# Patient Record
Sex: Female | Born: 1970 | Race: White | Hispanic: No | Smoking: Current every day smoker
Health system: Southern US, Community
[De-identification: ages and names within clinical notes are randomized; demographics above are authoritative.]

## PROBLEM LIST (undated history)

## (undated) DIAGNOSIS — C801 Malignant (primary) neoplasm, unspecified: Secondary | ICD-10-CM

## (undated) DIAGNOSIS — F32A Depression, unspecified: Secondary | ICD-10-CM

## (undated) DIAGNOSIS — Z8049 Family history of malignant neoplasm of other genital organs: Secondary | ICD-10-CM

## (undated) DIAGNOSIS — K5790 Diverticulosis of intestine, part unspecified, without perforation or abscess without bleeding: Secondary | ICD-10-CM

## (undated) DIAGNOSIS — K219 Gastro-esophageal reflux disease without esophagitis: Secondary | ICD-10-CM

## (undated) DIAGNOSIS — R17 Unspecified jaundice: Secondary | ICD-10-CM

## (undated) DIAGNOSIS — K589 Irritable bowel syndrome without diarrhea: Secondary | ICD-10-CM

## (undated) DIAGNOSIS — A4902 Methicillin resistant Staphylococcus aureus infection, unspecified site: Secondary | ICD-10-CM

## (undated) DIAGNOSIS — F419 Anxiety disorder, unspecified: Secondary | ICD-10-CM

## (undated) DIAGNOSIS — Z8042 Family history of malignant neoplasm of prostate: Secondary | ICD-10-CM

## (undated) DIAGNOSIS — R519 Headache, unspecified: Secondary | ICD-10-CM

## (undated) DIAGNOSIS — H269 Unspecified cataract: Secondary | ICD-10-CM

## (undated) DIAGNOSIS — F329 Major depressive disorder, single episode, unspecified: Secondary | ICD-10-CM

## (undated) DIAGNOSIS — R51 Headache: Secondary | ICD-10-CM

## (undated) DIAGNOSIS — H52209 Unspecified astigmatism, unspecified eye: Secondary | ICD-10-CM

## (undated) DIAGNOSIS — K2210 Ulcer of esophagus without bleeding: Secondary | ICD-10-CM

## (undated) DIAGNOSIS — Z8041 Family history of malignant neoplasm of ovary: Secondary | ICD-10-CM

## (undated) DIAGNOSIS — S92309K Fracture of unspecified metatarsal bone(s), unspecified foot, subsequent encounter for fracture with nonunion: Secondary | ICD-10-CM

## (undated) DIAGNOSIS — B958 Unspecified staphylococcus as the cause of diseases classified elsewhere: Secondary | ICD-10-CM

## (undated) DIAGNOSIS — Z87440 Personal history of urinary (tract) infections: Secondary | ICD-10-CM

## (undated) DIAGNOSIS — Z8719 Personal history of other diseases of the digestive system: Secondary | ICD-10-CM

## (undated) DIAGNOSIS — M1990 Unspecified osteoarthritis, unspecified site: Secondary | ICD-10-CM

## (undated) HISTORY — PX: DILATION AND CURETTAGE OF UTERUS: SHX78

## (undated) HISTORY — DX: Unspecified cataract: H26.9

## (undated) HISTORY — DX: Unspecified astigmatism, unspecified eye: H52.209

## (undated) HISTORY — DX: Malignant (primary) neoplasm, unspecified: C80.1

## (undated) HISTORY — DX: Personal history of urinary (tract) infections: Z87.440

## (undated) HISTORY — PX: WISDOM TOOTH EXTRACTION: SHX21

## (undated) HISTORY — DX: Unspecified staphylococcus as the cause of diseases classified elsewhere: B95.8

## (undated) HISTORY — DX: Unspecified osteoarthritis, unspecified site: M19.90

## (undated) HISTORY — DX: Unspecified jaundice: R17

## (undated) HISTORY — DX: Family history of malignant neoplasm of prostate: Z80.42

## (undated) HISTORY — DX: Family history of malignant neoplasm of ovary: Z80.41

## (undated) HISTORY — DX: Family history of malignant neoplasm of other genital organs: Z80.49

---

## 2001-04-16 ENCOUNTER — Emergency Department (HOSPITAL_COMMUNITY): Admission: EM | Admit: 2001-04-16 | Discharge: 2001-04-16 | Payer: Self-pay | Admitting: Emergency Medicine

## 2003-04-10 ENCOUNTER — Encounter: Payer: Self-pay | Admitting: Emergency Medicine

## 2003-04-10 ENCOUNTER — Emergency Department (HOSPITAL_COMMUNITY): Admission: EM | Admit: 2003-04-10 | Discharge: 2003-04-10 | Payer: Self-pay | Admitting: Emergency Medicine

## 2003-12-09 ENCOUNTER — Emergency Department (HOSPITAL_COMMUNITY): Admission: EM | Admit: 2003-12-09 | Discharge: 2003-12-09 | Payer: Self-pay | Admitting: Emergency Medicine

## 2004-01-29 ENCOUNTER — Emergency Department (HOSPITAL_COMMUNITY): Admission: EM | Admit: 2004-01-29 | Discharge: 2004-01-29 | Payer: Self-pay | Admitting: Family Medicine

## 2004-03-08 ENCOUNTER — Emergency Department (HOSPITAL_COMMUNITY): Admission: EM | Admit: 2004-03-08 | Discharge: 2004-03-08 | Payer: Self-pay | Admitting: Family Medicine

## 2004-05-07 ENCOUNTER — Emergency Department (HOSPITAL_COMMUNITY): Admission: EM | Admit: 2004-05-07 | Discharge: 2004-05-07 | Payer: Self-pay | Admitting: Family Medicine

## 2004-06-24 ENCOUNTER — Ambulatory Visit (HOSPITAL_COMMUNITY): Admission: RE | Admit: 2004-06-24 | Discharge: 2004-06-24 | Payer: Self-pay | Admitting: *Deleted

## 2004-06-24 ENCOUNTER — Encounter (INDEPENDENT_AMBULATORY_CARE_PROVIDER_SITE_OTHER): Payer: Self-pay | Admitting: *Deleted

## 2004-08-31 ENCOUNTER — Ambulatory Visit: Payer: Self-pay | Admitting: Psychiatry

## 2004-08-31 ENCOUNTER — Other Ambulatory Visit (HOSPITAL_COMMUNITY): Admission: RE | Admit: 2004-08-31 | Discharge: 2004-09-15 | Payer: Self-pay | Admitting: Psychiatry

## 2004-09-23 ENCOUNTER — Emergency Department (HOSPITAL_COMMUNITY): Admission: EM | Admit: 2004-09-23 | Discharge: 2004-09-23 | Payer: Self-pay | Admitting: *Deleted

## 2004-09-24 ENCOUNTER — Ambulatory Visit (HOSPITAL_COMMUNITY): Admission: RE | Admit: 2004-09-24 | Discharge: 2004-09-24 | Payer: Self-pay | Admitting: Family Medicine

## 2004-11-08 ENCOUNTER — Ambulatory Visit: Payer: Self-pay | Admitting: Family Medicine

## 2004-11-11 ENCOUNTER — Ambulatory Visit: Payer: Self-pay | Admitting: Family Medicine

## 2004-11-12 ENCOUNTER — Ambulatory Visit: Payer: Self-pay | Admitting: *Deleted

## 2004-11-29 ENCOUNTER — Ambulatory Visit: Payer: Self-pay | Admitting: Family Medicine

## 2004-12-07 ENCOUNTER — Ambulatory Visit: Payer: Self-pay | Admitting: Family Medicine

## 2004-12-17 ENCOUNTER — Ambulatory Visit: Payer: Self-pay | Admitting: Internal Medicine

## 2004-12-20 ENCOUNTER — Ambulatory Visit: Payer: Self-pay | Admitting: Family Medicine

## 2005-01-12 ENCOUNTER — Ambulatory Visit: Payer: Self-pay | Admitting: Internal Medicine

## 2005-03-03 ENCOUNTER — Emergency Department (HOSPITAL_COMMUNITY): Admission: EM | Admit: 2005-03-03 | Discharge: 2005-03-03 | Payer: Self-pay | Admitting: Family Medicine

## 2005-03-14 ENCOUNTER — Emergency Department (HOSPITAL_COMMUNITY): Admission: EM | Admit: 2005-03-14 | Discharge: 2005-03-14 | Payer: Self-pay | Admitting: Emergency Medicine

## 2005-03-29 ENCOUNTER — Ambulatory Visit: Payer: Self-pay | Admitting: Psychiatry

## 2005-03-29 ENCOUNTER — Inpatient Hospital Stay (HOSPITAL_COMMUNITY): Admission: RE | Admit: 2005-03-29 | Discharge: 2005-04-03 | Payer: Self-pay | Admitting: Psychiatry

## 2005-04-21 ENCOUNTER — Emergency Department (HOSPITAL_COMMUNITY): Admission: EM | Admit: 2005-04-21 | Discharge: 2005-04-21 | Payer: Self-pay | Admitting: Family Medicine

## 2005-04-24 ENCOUNTER — Emergency Department (HOSPITAL_COMMUNITY): Admission: EM | Admit: 2005-04-24 | Discharge: 2005-04-24 | Payer: Self-pay | Admitting: Family Medicine

## 2005-05-01 ENCOUNTER — Emergency Department (HOSPITAL_COMMUNITY): Admission: EM | Admit: 2005-05-01 | Discharge: 2005-05-01 | Payer: Self-pay | Admitting: Emergency Medicine

## 2005-05-22 ENCOUNTER — Inpatient Hospital Stay (HOSPITAL_COMMUNITY): Admission: EM | Admit: 2005-05-22 | Discharge: 2005-05-23 | Payer: Self-pay | Admitting: Emergency Medicine

## 2005-05-23 ENCOUNTER — Emergency Department (HOSPITAL_COMMUNITY): Admission: EM | Admit: 2005-05-23 | Discharge: 2005-05-23 | Payer: Self-pay | Admitting: *Deleted

## 2005-05-24 ENCOUNTER — Ambulatory Visit (HOSPITAL_COMMUNITY): Admission: RE | Admit: 2005-05-24 | Discharge: 2005-05-24 | Payer: Self-pay | Admitting: *Deleted

## 2005-05-26 ENCOUNTER — Emergency Department (HOSPITAL_COMMUNITY): Admission: EM | Admit: 2005-05-26 | Discharge: 2005-05-26 | Payer: Self-pay | Admitting: Emergency Medicine

## 2005-07-17 ENCOUNTER — Emergency Department (HOSPITAL_COMMUNITY): Admission: EM | Admit: 2005-07-17 | Discharge: 2005-07-17 | Payer: Self-pay | Admitting: Family Medicine

## 2005-08-11 ENCOUNTER — Emergency Department (HOSPITAL_COMMUNITY): Admission: EM | Admit: 2005-08-11 | Discharge: 2005-08-11 | Payer: Self-pay | Admitting: Emergency Medicine

## 2005-09-08 ENCOUNTER — Emergency Department (HOSPITAL_COMMUNITY): Admission: EM | Admit: 2005-09-08 | Discharge: 2005-09-08 | Payer: Self-pay | Admitting: Emergency Medicine

## 2005-09-27 ENCOUNTER — Ambulatory Visit: Payer: Self-pay | Admitting: Family Medicine

## 2005-12-01 ENCOUNTER — Ambulatory Visit: Payer: Self-pay | Admitting: Family Medicine

## 2006-01-30 ENCOUNTER — Inpatient Hospital Stay (HOSPITAL_COMMUNITY): Admission: RE | Admit: 2006-01-30 | Discharge: 2006-02-02 | Payer: Self-pay | Admitting: *Deleted

## 2006-01-31 ENCOUNTER — Ambulatory Visit: Payer: Self-pay | Admitting: *Deleted

## 2007-04-23 ENCOUNTER — Inpatient Hospital Stay (HOSPITAL_COMMUNITY): Admission: EM | Admit: 2007-04-23 | Discharge: 2007-04-26 | Payer: Self-pay | Admitting: Emergency Medicine

## 2007-05-14 DIAGNOSIS — K2990 Gastroduodenitis, unspecified, without bleeding: Secondary | ICD-10-CM

## 2007-05-14 DIAGNOSIS — K589 Irritable bowel syndrome without diarrhea: Secondary | ICD-10-CM | POA: Insufficient documentation

## 2007-05-14 DIAGNOSIS — K219 Gastro-esophageal reflux disease without esophagitis: Secondary | ICD-10-CM | POA: Insufficient documentation

## 2007-05-14 DIAGNOSIS — Z8739 Personal history of other diseases of the musculoskeletal system and connective tissue: Secondary | ICD-10-CM

## 2007-05-14 DIAGNOSIS — K649 Unspecified hemorrhoids: Secondary | ICD-10-CM | POA: Insufficient documentation

## 2007-05-14 DIAGNOSIS — K056 Periodontal disease, unspecified: Secondary | ICD-10-CM | POA: Insufficient documentation

## 2007-05-14 DIAGNOSIS — K2970 Gastritis, unspecified, without bleeding: Secondary | ICD-10-CM

## 2007-05-14 DIAGNOSIS — K069 Disorder of gingiva and edentulous alveolar ridge, unspecified: Secondary | ICD-10-CM

## 2007-05-14 DIAGNOSIS — F1410 Cocaine abuse, uncomplicated: Secondary | ICD-10-CM | POA: Insufficient documentation

## 2007-05-14 DIAGNOSIS — Z8719 Personal history of other diseases of the digestive system: Secondary | ICD-10-CM

## 2007-05-16 ENCOUNTER — Encounter (INDEPENDENT_AMBULATORY_CARE_PROVIDER_SITE_OTHER): Payer: Self-pay | Admitting: *Deleted

## 2007-05-31 ENCOUNTER — Ambulatory Visit: Payer: Self-pay | Admitting: Internal Medicine

## 2007-05-31 DIAGNOSIS — E7800 Pure hypercholesterolemia, unspecified: Secondary | ICD-10-CM | POA: Insufficient documentation

## 2007-05-31 DIAGNOSIS — G4700 Insomnia, unspecified: Secondary | ICD-10-CM | POA: Insufficient documentation

## 2007-05-31 DIAGNOSIS — L408 Other psoriasis: Secondary | ICD-10-CM

## 2007-05-31 DIAGNOSIS — F3189 Other bipolar disorder: Secondary | ICD-10-CM | POA: Insufficient documentation

## 2007-06-19 ENCOUNTER — Ambulatory Visit: Payer: Self-pay | Admitting: Internal Medicine

## 2007-07-03 ENCOUNTER — Ambulatory Visit: Payer: Self-pay | Admitting: Internal Medicine

## 2007-07-03 DIAGNOSIS — R197 Diarrhea, unspecified: Secondary | ICD-10-CM | POA: Insufficient documentation

## 2007-07-03 DIAGNOSIS — D759 Disease of blood and blood-forming organs, unspecified: Secondary | ICD-10-CM | POA: Insufficient documentation

## 2007-07-03 DIAGNOSIS — Z85820 Personal history of malignant melanoma of skin: Secondary | ICD-10-CM

## 2007-07-09 ENCOUNTER — Encounter (INDEPENDENT_AMBULATORY_CARE_PROVIDER_SITE_OTHER): Payer: Self-pay | Admitting: Internal Medicine

## 2007-07-10 LAB — CONVERTED CEMR LAB
Basophils Absolute: 0 10*3/uL (ref 0.0–0.1)
Basophils Relative: 1 % (ref 0–1)
Cholesterol: 187 mg/dL (ref 0–200)
Eosinophils Absolute: 0.3 10*3/uL (ref 0.0–0.7)
Eosinophils Relative: 3 % (ref 0–5)
HDL: 39 mg/dL — ABNORMAL LOW (ref >39)
LDL Cholesterol: 115 mg/dL — ABNORMAL HIGH (ref 0–99)
Lymphs Abs: 0.9 10*3/uL (ref 0.7–3.3)
MCHC: 30.3 g/dL (ref 30.0–36.0)
Monocytes Absolute: 0.4 10*3/uL (ref 0.2–0.7)
Neutro Abs: 6.4 10*3/uL (ref 1.7–7.7)
Neutrophils Relative %: 81 % — ABNORMAL HIGH (ref 43–77)
RBC: 4.68 M/uL (ref 3.87–5.11)
Triglycerides: 165 mg/dL — ABNORMAL HIGH (ref <150)
VLDL: 33 mg/dL (ref 0–40)
WBC: 7.9 10*3/uL (ref 4.0–10.5)

## 2007-07-18 LAB — CONVERTED CEMR LAB
ALT: 22 units/L (ref 0–35)
AST: 18 units/L (ref 0–37)
Alkaline Phosphatase: 69 units/L (ref 39–117)
TSH: 1.762 microintl units/mL (ref 0.350–5.50)

## 2007-08-14 ENCOUNTER — Ambulatory Visit: Payer: Self-pay | Admitting: Internal Medicine

## 2007-08-14 DIAGNOSIS — D239 Other benign neoplasm of skin, unspecified: Secondary | ICD-10-CM | POA: Insufficient documentation

## 2007-08-22 LAB — CONVERTED CEMR LAB: Homocysteine: 47.1 micromoles/L — ABNORMAL HIGH (ref 4.0–15.4)

## 2007-08-28 ENCOUNTER — Ambulatory Visit: Payer: Self-pay | Admitting: Internal Medicine

## 2007-08-29 ENCOUNTER — Encounter (INDEPENDENT_AMBULATORY_CARE_PROVIDER_SITE_OTHER): Payer: Self-pay | Admitting: Internal Medicine

## 2007-09-05 ENCOUNTER — Encounter (INDEPENDENT_AMBULATORY_CARE_PROVIDER_SITE_OTHER): Payer: Self-pay | Admitting: Internal Medicine

## 2007-09-05 DIAGNOSIS — E538 Deficiency of other specified B group vitamins: Secondary | ICD-10-CM

## 2007-09-13 ENCOUNTER — Ambulatory Visit: Payer: Self-pay | Admitting: Internal Medicine

## 2007-09-17 ENCOUNTER — Encounter (INDEPENDENT_AMBULATORY_CARE_PROVIDER_SITE_OTHER): Payer: Self-pay | Admitting: Internal Medicine

## 2007-09-20 ENCOUNTER — Telehealth (INDEPENDENT_AMBULATORY_CARE_PROVIDER_SITE_OTHER): Payer: Self-pay | Admitting: *Deleted

## 2007-10-04 ENCOUNTER — Telehealth (INDEPENDENT_AMBULATORY_CARE_PROVIDER_SITE_OTHER): Payer: Self-pay | Admitting: Internal Medicine

## 2007-10-18 ENCOUNTER — Ambulatory Visit: Payer: Self-pay | Admitting: Internal Medicine

## 2007-11-01 ENCOUNTER — Ambulatory Visit: Payer: Self-pay | Admitting: Internal Medicine

## 2007-11-01 DIAGNOSIS — K648 Other hemorrhoids: Secondary | ICD-10-CM

## 2007-11-02 ENCOUNTER — Encounter (INDEPENDENT_AMBULATORY_CARE_PROVIDER_SITE_OTHER): Payer: Self-pay | Admitting: Internal Medicine

## 2007-11-07 LAB — CONVERTED CEMR LAB
Anti Nuclear Antibody(ANA): NEGATIVE
IgA: 203 mg/dL (ref 68–378)
Sed Rate: 8 mm/hr (ref 0–22)

## 2007-11-15 ENCOUNTER — Ambulatory Visit: Payer: Self-pay | Admitting: Internal Medicine

## 2007-11-29 ENCOUNTER — Telehealth (INDEPENDENT_AMBULATORY_CARE_PROVIDER_SITE_OTHER): Payer: Self-pay | Admitting: Internal Medicine

## 2007-12-10 ENCOUNTER — Telehealth (INDEPENDENT_AMBULATORY_CARE_PROVIDER_SITE_OTHER): Payer: Self-pay | Admitting: Internal Medicine

## 2007-12-17 ENCOUNTER — Ambulatory Visit: Payer: Self-pay | Admitting: Internal Medicine

## 2007-12-19 ENCOUNTER — Telehealth (INDEPENDENT_AMBULATORY_CARE_PROVIDER_SITE_OTHER): Payer: Self-pay | Admitting: *Deleted

## 2008-01-16 ENCOUNTER — Ambulatory Visit: Payer: Self-pay | Admitting: Internal Medicine

## 2008-02-15 ENCOUNTER — Ambulatory Visit: Payer: Self-pay | Admitting: Internal Medicine

## 2008-03-19 ENCOUNTER — Ambulatory Visit: Payer: Self-pay | Admitting: Internal Medicine

## 2008-04-22 ENCOUNTER — Telehealth (INDEPENDENT_AMBULATORY_CARE_PROVIDER_SITE_OTHER): Payer: Self-pay | Admitting: Internal Medicine

## 2008-05-30 ENCOUNTER — Telehealth (INDEPENDENT_AMBULATORY_CARE_PROVIDER_SITE_OTHER): Payer: Self-pay | Admitting: Internal Medicine

## 2008-06-03 ENCOUNTER — Ambulatory Visit: Payer: Self-pay | Admitting: Internal Medicine

## 2008-06-03 DIAGNOSIS — L659 Nonscarring hair loss, unspecified: Secondary | ICD-10-CM | POA: Insufficient documentation

## 2008-06-04 ENCOUNTER — Telehealth (INDEPENDENT_AMBULATORY_CARE_PROVIDER_SITE_OTHER): Payer: Self-pay | Admitting: Internal Medicine

## 2008-06-30 ENCOUNTER — Encounter (INDEPENDENT_AMBULATORY_CARE_PROVIDER_SITE_OTHER): Payer: Self-pay | Admitting: Internal Medicine

## 2008-07-03 ENCOUNTER — Ambulatory Visit: Payer: Self-pay | Admitting: Internal Medicine

## 2008-07-15 ENCOUNTER — Encounter (INDEPENDENT_AMBULATORY_CARE_PROVIDER_SITE_OTHER): Payer: Self-pay | Admitting: *Deleted

## 2008-08-04 ENCOUNTER — Encounter (INDEPENDENT_AMBULATORY_CARE_PROVIDER_SITE_OTHER): Payer: Self-pay | Admitting: *Deleted

## 2008-08-12 ENCOUNTER — Ambulatory Visit: Payer: Self-pay | Admitting: Internal Medicine

## 2008-09-08 ENCOUNTER — Encounter (INDEPENDENT_AMBULATORY_CARE_PROVIDER_SITE_OTHER): Payer: Self-pay | Admitting: Internal Medicine

## 2008-09-16 ENCOUNTER — Ambulatory Visit: Payer: Self-pay | Admitting: Internal Medicine

## 2008-11-14 ENCOUNTER — Ambulatory Visit: Payer: Self-pay | Admitting: Internal Medicine

## 2008-12-21 ENCOUNTER — Encounter (INDEPENDENT_AMBULATORY_CARE_PROVIDER_SITE_OTHER): Payer: Self-pay | Admitting: Family Medicine

## 2008-12-21 ENCOUNTER — Emergency Department (HOSPITAL_COMMUNITY): Admission: EM | Admit: 2008-12-21 | Discharge: 2008-12-21 | Payer: Self-pay | Admitting: Psychiatry

## 2008-12-22 ENCOUNTER — Ambulatory Visit: Payer: Self-pay | Admitting: Family Medicine

## 2008-12-22 DIAGNOSIS — M79609 Pain in unspecified limb: Secondary | ICD-10-CM | POA: Insufficient documentation

## 2008-12-25 ENCOUNTER — Emergency Department (HOSPITAL_COMMUNITY): Admission: EM | Admit: 2008-12-25 | Discharge: 2008-12-25 | Payer: Self-pay | Admitting: Emergency Medicine

## 2008-12-25 ENCOUNTER — Encounter (INDEPENDENT_AMBULATORY_CARE_PROVIDER_SITE_OTHER): Payer: Self-pay | Admitting: Internal Medicine

## 2008-12-26 ENCOUNTER — Telehealth (INDEPENDENT_AMBULATORY_CARE_PROVIDER_SITE_OTHER): Payer: Self-pay | Admitting: Family Medicine

## 2008-12-28 ENCOUNTER — Emergency Department (HOSPITAL_COMMUNITY): Admission: EM | Admit: 2008-12-28 | Discharge: 2008-12-28 | Payer: Self-pay | Admitting: Emergency Medicine

## 2009-01-17 ENCOUNTER — Emergency Department (HOSPITAL_COMMUNITY): Admission: EM | Admit: 2009-01-17 | Discharge: 2009-01-17 | Payer: Self-pay | Admitting: Emergency Medicine

## 2009-02-14 ENCOUNTER — Emergency Department (HOSPITAL_COMMUNITY): Admission: EM | Admit: 2009-02-14 | Discharge: 2009-02-14 | Payer: Self-pay | Admitting: Emergency Medicine

## 2009-03-24 ENCOUNTER — Ambulatory Visit: Payer: Self-pay | Admitting: Gastroenterology

## 2009-03-24 DIAGNOSIS — R109 Unspecified abdominal pain: Secondary | ICD-10-CM | POA: Insufficient documentation

## 2009-03-24 LAB — CONVERTED CEMR LAB
ALT: 26 units/L (ref 0–35)
AST: 21 units/L (ref 0–37)
Albumin: 3.9 g/dL (ref 3.5–5.2)
BUN: 5 mg/dL — ABNORMAL LOW (ref 6–23)
CO2: 30 meq/L (ref 19–32)
Calcium: 9 mg/dL (ref 8.4–10.5)
Eosinophils Absolute: 0.3 10*3/uL (ref 0.0–0.7)
HCT: 40.3 % (ref 36.0–46.0)
Hemoglobin: 14 g/dL (ref 12.0–15.0)
Lymphocytes Relative: 10.1 % — ABNORMAL LOW (ref 12.0–46.0)
Lymphs Abs: 1.1 10*3/uL (ref 0.7–4.0)
Neutro Abs: 8.9 10*3/uL — ABNORMAL HIGH (ref 1.4–7.7)
Platelets: 340 10*3/uL (ref 150.0–400.0)
RBC: 4.13 M/uL (ref 3.87–5.11)
RDW: 13.7 % (ref 11.5–14.6)
Sed Rate: 7 mm/hr (ref 0–22)
Sodium: 137 meq/L (ref 135–145)

## 2009-03-26 ENCOUNTER — Telehealth (INDEPENDENT_AMBULATORY_CARE_PROVIDER_SITE_OTHER): Payer: Self-pay | Admitting: *Deleted

## 2009-03-26 ENCOUNTER — Ambulatory Visit: Payer: Self-pay | Admitting: Internal Medicine

## 2009-03-30 ENCOUNTER — Encounter: Payer: Self-pay | Admitting: Gastroenterology

## 2009-03-30 ENCOUNTER — Ambulatory Visit: Payer: Self-pay | Admitting: Gastroenterology

## 2009-03-31 ENCOUNTER — Encounter (INDEPENDENT_AMBULATORY_CARE_PROVIDER_SITE_OTHER): Payer: Self-pay | Admitting: *Deleted

## 2009-04-02 ENCOUNTER — Telehealth (INDEPENDENT_AMBULATORY_CARE_PROVIDER_SITE_OTHER): Payer: Self-pay | Admitting: *Deleted

## 2009-04-15 ENCOUNTER — Ambulatory Visit (HOSPITAL_COMMUNITY): Admission: RE | Admit: 2009-04-15 | Discharge: 2009-04-15 | Payer: Self-pay | Admitting: Emergency Medicine

## 2009-04-15 ENCOUNTER — Encounter (INDEPENDENT_AMBULATORY_CARE_PROVIDER_SITE_OTHER): Payer: Self-pay | Admitting: Emergency Medicine

## 2009-04-15 ENCOUNTER — Ambulatory Visit: Payer: Self-pay | Admitting: Vascular Surgery

## 2009-04-20 ENCOUNTER — Encounter: Payer: Self-pay | Admitting: Gastroenterology

## 2009-05-15 ENCOUNTER — Ambulatory Visit: Payer: Self-pay | Admitting: Gastroenterology

## 2009-05-19 ENCOUNTER — Encounter (INDEPENDENT_AMBULATORY_CARE_PROVIDER_SITE_OTHER): Payer: Self-pay | Admitting: Internal Medicine

## 2009-05-22 ENCOUNTER — Encounter: Admission: RE | Admit: 2009-05-22 | Discharge: 2009-05-22 | Payer: Self-pay | Admitting: Orthopedic Surgery

## 2009-05-28 ENCOUNTER — Encounter: Payer: Self-pay | Admitting: Gastroenterology

## 2009-06-15 ENCOUNTER — Emergency Department (HOSPITAL_COMMUNITY): Admission: EM | Admit: 2009-06-15 | Discharge: 2009-06-15 | Payer: Self-pay | Admitting: Emergency Medicine

## 2009-06-15 ENCOUNTER — Telehealth: Payer: Self-pay | Admitting: Gastroenterology

## 2009-06-16 ENCOUNTER — Telehealth: Payer: Self-pay | Admitting: Gastroenterology

## 2009-06-16 ENCOUNTER — Ambulatory Visit: Payer: Self-pay | Admitting: Gastroenterology

## 2009-06-17 ENCOUNTER — Encounter: Payer: Self-pay | Admitting: Gastroenterology

## 2009-06-17 ENCOUNTER — Ambulatory Visit: Payer: Self-pay | Admitting: Gastroenterology

## 2009-06-17 DIAGNOSIS — B3781 Candidal esophagitis: Secondary | ICD-10-CM | POA: Insufficient documentation

## 2009-08-26 ENCOUNTER — Telehealth: Payer: Self-pay | Admitting: Gastroenterology

## 2009-08-29 HISTORY — PX: KNEE ARTHROSCOPY: SUR90

## 2009-10-16 ENCOUNTER — Encounter: Payer: Self-pay | Admitting: Cardiology

## 2009-10-16 ENCOUNTER — Encounter: Admission: RE | Admit: 2009-10-16 | Discharge: 2009-10-16 | Payer: Self-pay | Admitting: Internal Medicine

## 2009-10-19 ENCOUNTER — Ambulatory Visit: Payer: Self-pay | Admitting: Cardiology

## 2009-10-19 DIAGNOSIS — R079 Chest pain, unspecified: Secondary | ICD-10-CM | POA: Insufficient documentation

## 2009-10-19 DIAGNOSIS — Z8669 Personal history of other diseases of the nervous system and sense organs: Secondary | ICD-10-CM

## 2009-10-19 DIAGNOSIS — F17200 Nicotine dependence, unspecified, uncomplicated: Secondary | ICD-10-CM | POA: Insufficient documentation

## 2009-10-19 DIAGNOSIS — R55 Syncope and collapse: Secondary | ICD-10-CM | POA: Insufficient documentation

## 2009-10-20 ENCOUNTER — Ambulatory Visit: Payer: Self-pay

## 2009-10-22 ENCOUNTER — Telehealth: Payer: Self-pay | Admitting: Cardiology

## 2009-10-27 ENCOUNTER — Telehealth (INDEPENDENT_AMBULATORY_CARE_PROVIDER_SITE_OTHER): Payer: Self-pay | Admitting: *Deleted

## 2009-10-28 ENCOUNTER — Ambulatory Visit: Payer: Self-pay | Admitting: Cardiovascular Disease

## 2009-10-28 ENCOUNTER — Ambulatory Visit: Payer: Self-pay

## 2009-10-28 ENCOUNTER — Encounter: Payer: Self-pay | Admitting: Cardiology

## 2009-10-28 ENCOUNTER — Encounter (HOSPITAL_COMMUNITY): Admission: RE | Admit: 2009-10-28 | Discharge: 2009-12-29 | Payer: Self-pay | Admitting: Cardiology

## 2009-10-28 ENCOUNTER — Ambulatory Visit (HOSPITAL_COMMUNITY): Admission: RE | Admit: 2009-10-28 | Discharge: 2009-10-28 | Payer: Self-pay | Admitting: Cardiology

## 2009-11-02 ENCOUNTER — Telehealth: Payer: Self-pay | Admitting: Cardiology

## 2009-11-16 ENCOUNTER — Emergency Department (HOSPITAL_COMMUNITY): Admission: EM | Admit: 2009-11-16 | Discharge: 2009-11-16 | Payer: Self-pay | Admitting: Emergency Medicine

## 2009-11-19 ENCOUNTER — Other Ambulatory Visit: Payer: Self-pay

## 2009-11-19 ENCOUNTER — Other Ambulatory Visit: Payer: Self-pay | Admitting: Emergency Medicine

## 2009-11-19 ENCOUNTER — Inpatient Hospital Stay (HOSPITAL_COMMUNITY): Admission: AD | Admit: 2009-11-19 | Discharge: 2009-11-26 | Payer: Self-pay | Admitting: Psychiatry

## 2009-11-19 ENCOUNTER — Ambulatory Visit: Payer: Self-pay | Admitting: Psychiatry

## 2009-12-16 ENCOUNTER — Encounter: Payer: Self-pay | Admitting: Cardiology

## 2010-01-01 ENCOUNTER — Emergency Department (HOSPITAL_COMMUNITY): Admission: EM | Admit: 2010-01-01 | Discharge: 2010-01-01 | Payer: Self-pay | Admitting: Emergency Medicine

## 2010-01-17 ENCOUNTER — Emergency Department (HOSPITAL_COMMUNITY): Admission: EM | Admit: 2010-01-17 | Discharge: 2010-01-17 | Payer: Self-pay | Admitting: Family Medicine

## 2010-02-01 ENCOUNTER — Emergency Department (HOSPITAL_COMMUNITY): Admission: EM | Admit: 2010-02-01 | Discharge: 2010-02-01 | Payer: Self-pay | Admitting: Emergency Medicine

## 2010-02-11 ENCOUNTER — Emergency Department (HOSPITAL_COMMUNITY): Admission: EM | Admit: 2010-02-11 | Discharge: 2010-02-11 | Payer: Self-pay | Admitting: Emergency Medicine

## 2010-02-13 ENCOUNTER — Emergency Department (HOSPITAL_COMMUNITY): Admission: EM | Admit: 2010-02-13 | Discharge: 2010-02-13 | Payer: Self-pay | Admitting: Emergency Medicine

## 2010-02-14 ENCOUNTER — Emergency Department (HOSPITAL_COMMUNITY): Admission: EM | Admit: 2010-02-14 | Discharge: 2010-02-14 | Payer: Self-pay | Admitting: Emergency Medicine

## 2010-08-05 ENCOUNTER — Emergency Department (HOSPITAL_COMMUNITY): Admission: EM | Admit: 2010-08-05 | Discharge: 2010-01-14 | Payer: Self-pay | Admitting: Emergency Medicine

## 2010-09-12 ENCOUNTER — Emergency Department (HOSPITAL_COMMUNITY)
Admission: EM | Admit: 2010-09-12 | Discharge: 2010-09-12 | Payer: Self-pay | Source: Home / Self Care | Admitting: Emergency Medicine

## 2010-09-19 ENCOUNTER — Encounter: Payer: Self-pay | Admitting: Family Medicine

## 2010-09-30 NOTE — Progress Notes (Signed)
Summary: TEST RESULTS (STRESS TEST) faxed   Phone Note Call from Patient Call back at Home Phone [phone]   Caller: Patient Reason for Call: Lab or Test Results Summary of Call: STRESS TEST Initial call taken by: Judie Grieve,  November 02, 2009 1:17 PM  Follow-up for Phone Call        Pt had stress nuclear study and echocardiogram for evaluation of syncope.  The stress test demonstrated no ischemia or infarct.  The echo demonstrated a normal EF and no evidence of structural heart disease.  No etiology for the syncope is evident.  Based on these studies and the lack of high risk physical or cardiac history findings, according to ACC/AHA guidelines, the patient would be at acceptable risk for a planned left knee arthroscopy. No further testing is needed prior to this procedure. Follow-up by: Rollene Rotunda, MD, [facility],  November 02, 2009 2:13 PM  Additional Follow-up for Phone Call Additional follow up Details #1::        faxed to Dr Malon Kindle at [facility]. Additional Follow-up by: Charolotte Capuchin, RN,  November 02, 2009 3:27 PM

## 2010-09-30 NOTE — Progress Notes (Signed)
Summary: Nuclear Pre-Procedure  Phone Note Outgoing Call   Call placed by: Milana Na, EMT-P,  October 27, 2009 3:30 PM Summary of Call: Left message with information on Myoview Information Sheet (see scanned document for details).      Nuclear Med Background Indications for Stress Test: Evaluation for Ischemia, Abnormal EKG     Symptoms: Chest Pain, Syncope    Nuclear Pre-Procedure Cardiac Risk Factors: Family History - CAD, Smoker Height (in): 54  Nuclear Med Study Referring MD:  J.Hochrein

## 2010-09-30 NOTE — Progress Notes (Signed)
Summary: nerves   Phone Note Call from Patient Call back at Home Phone [phone]   Caller: Patient Reason for Call: Talk to Nurse Summary of Call: pt needs something to help calm her nerves.... very upset.... does not have pcp Initial call taken by: Migdalia Dk,  October 22, 2009 1:48 PM  Follow-up for Phone Call        very nervous, can't sleep and cant sit still, states she has about had enough, she is too stress and doesn't have anyone to talk to.  She became very upset after I requested she see a primary care or UrgentCare for treatment. She states  that her mother's cardiologist gives her Ativan for her nerves and she doesn't understand why Dr Antoine Poche can't do that for her.  Explained to pt Dr Antoine Poche would prefer pt see a a primary care MD for treatment as this is not his speciality. Pt "thanked me" for my help and hung up. Follow-up by: Charolotte Capuchin, RN,  October 22, 2009 6:17 PM

## 2010-09-30 NOTE — Assessment & Plan Note (Signed)
Summary: Cardiology Nuclear Study  Nuclear Med Background Indications for Stress Test: Evaluation for Ischemia, Surgical Clearance, Abnormal EKG  Indications Comments: Pending (L) knee surgery by Dr. Malon Kindle  History: Echo  History Comments: 10/28/09 Echo:EF=55-60%; h/o cocaine abuse  Symptoms: Chest Pressure, Diaphoresis, Dizziness, DOE, Fatigue, Light-Headedness, Nausea, Palpitations, Rapid HR, Syncope, Vomiting    Nuclear Pre-Procedure Cardiac Risk Factors: Family History - CAD, Smoker Caffeine/Decaff Intake: None NPO After: 8:00 AM Lungs: Clear.  O2 Sat 98% on RA. IV 0.9% NS with Angio Cath: 24g     IV Site: (R) AC IV Started by: Irean Hong RN Chest Size (in) 36     Cup Size D     Height (in): 62 Weight (lb): 159 BMI: 29.19 Tech Comments: Propranolol held x 19 hours.  Nuclear Med Study 1 or 2 day study:  1 day     Stress Test Type:  Eugenie Birks Reading MD:  Charlton Haws, MD     Referring MD:  Rollene Rotunda, MD Resting Radionuclide:  Technetium 75m Tetrofosmin     Resting Radionuclide Dose:  11.0 mCi  Stress Radionuclide:  Technetium 55m Tetrofosmin     Stress Radionuclide Dose:  33.0 mCi   Stress Protocol   Lexiscan: 0.4 mg   Stress Test Technologist:  Rea [employer] CMA-N     Nuclear Technologist:  Burna Mortimer Deal RT-N  Rest Procedure  Myocardial perfusion imaging was performed at rest 45 minutes following the intravenous administration of Myoview Technetium 30m Tetrofosmin.  Stress Procedure  The patient initially walked on the treadmill utilizing the Bruce protocol, but was unable to achieve her target heart rate due to (L) knee pain and only holding her Inderal for 19-hours.  She then received IV Lexiscan 0.4 mg over 15-seconds.  Myoview injected at 30-seconds.  There was a brief change with diffuse T-waves at peak bolus.  Quantitative spect images were obtained after a 45 minute delay.  QPS Raw Data Images:  Normal; no motion artifact; normal heart/lung  ratio. Stress Images:  NI: Uniform and normal uptake of tracer in all myocardial segments. Rest Images:  Normal homogeneous uptake in all areas of the myocardium. Subtraction (SDS):  Normal Transient Ischemic Dilatation:  1.13  (Normal <1.22)  Lung/Heart Ratio:  .34  (Normal <0.45)  Quantitative Gated Spect Images QGS EDV:  65 ml QGS ESV:  19 ml QGS EF:  70 % QGS cine images:  normal  Findings Normal nuclear study      Overall Impression  Exercise Capacity: Lexiscan BP Response: Normal blood pressure response. Clinical Symptoms: Dyspnea and lightheadedness ECG Impression: No significant ST segment change suggestive of ischemia. Overall Impression: Normal stress nuclear study. Overall Impression Comments: normal  Appended Document: Cardiology Nuclear Study NL.  No evidence of ischemia.  Appended Document: Cardiology Nuclear Study Called patient and left message on machine of results and that clearance has been faxed.

## 2010-09-30 NOTE — Assessment & Plan Note (Signed)
Summary: np6/dx:syncope x3/lg  Medications Added CLONAZEPAM ODT 2 MG  TBDP (CLONAZEPAM) 1 tab by mouth at hs DIFLUCAN 100 MG TABS (FLUCONAZOLE) Take 1  tablets p.o. every other day DICYCLOMINE HCL 10 MG CAPS (DICYCLOMINE HCL) 1 po daily AMBIEN 10 MG TABS (ZOLPIDEM TARTRATE) 1 podaily      Allergies Added:   Visit Type:  Initial Consult Referring Provider:  n/a Primary Provider:  Dr. Robert Bellow  CC:  syncope.  History of Present Illness: The patient presents for evaluation of syncope. She says this has been going on for about 1-1/2 months. She says it's happening every other day yet she describes 4 events. She describes most of her episodes happening while seated. For instance she will get home from the grocery store and find that she has apparently passed out in her car being awoken by her boyfriend. She says 3 of the episodes it happened while seated. She said she did fall once with an episode while in her bed apparently sliding to the floor. She says she has lost bowel and bladder on one occasion. She has no prodrome that it's happening. She doesn't feel the palpitations or presyncope described below. She doesn't develop chest discomfort. She is confused when she wakes. There is no seizure activity seen by any outside observers. She does report chest discomfort. However, this happens mostly at night while lying flat. It is a pressure discomfort associated with anxiety that will eventually go away when she tries to light calmly. She does describe shortness of breath when she gets anxious. She says she has a lot of social stress going on. She has never had any prior cardiac workup or diagnosis.  Current Medications (verified): 1)  Citalopram Hydrobromide 40 Mg  Tabs (Citalopram Hydrobromide) .[name].. 1 Tab By Mouth in Am, 1/2 Tab By Mouth At Noon 2)  Clonazepam Odt 2 Mg  Tbdp (Clonazepam) .[name].. 1 Tab By Mouth At [facility] 3)  Lithium Carbonate 300 Mg  Caps (Lithium Carbonate) .[name].. 1 Cap By Mouth in Am  and 2 Caps By Mouth At [facility] On Odd Days, 1 Cap By Mouth At [facility] On Even Days 4)  Levsin/sl 0.125 Mg  Subl (Hyoscyamine Sulfate) .... Take One To Two Tabs As Needed 3 Times A Day For Spasms 5)  Preparation H Hydrocortisone 1 % Crea (Hydrocortisone) .... As Needed 6)  Anamantle Hc 3-2.5 % Kit (Lidocaine-Hydrocortisone Ace) .... Use Per Rectum Once Daily 7)  Ibuprofen 200 Mg Tabs (Ibuprofen) .... As Needed 8)  Propranolol Hcl 20 Mg Tabs (Propranolol Hcl) .... Two Times A Day 9)  Bentyl 20 Mg Tabs (Dicyclomine Hcl) .[name].. 1 By Mouth Four Times A Day 10)  Diflucan 100 Mg Tabs (Fluconazole) .... Take 1  Tablets P.o. Every Other Day 11)  Dicyclomine Hcl 10 Mg Caps (Dicyclomine Hcl) .[name].. 1 Po Daily 12)  Ambien 10 Mg Tabs (Zolpidem Tartrate) .[name].. 1 Podaily  Allergies (verified): 1)  ! Codeine 2)  ! Epinephrine  Past History:  Past Medical History: HEMORRHOIDS (ICD-455.6) DIVERTICULOSIS, COLON, HX OF (ICD-V12.79) GASTRITIS (ICD-535.50) Endoscopy/Colonoscopy Sabino Gasser 10/07 IBS (ICD-564.1) DISORDER, BIPOLAR NEC (ICD-296.89) COCAINE ABUSE (ICD-305.60) (treatment in 1996) GERD (ICD-530.81) DEPRESSION (ICD-311) PERIODONTAL DISEASE (ICD-523.9) MYALGIA, HX OF (ICD-V13.5)     Family History: Mother with CAD at 36, CABG age 61.  Father MI age 44 died of MI age 34. Diabetes in second degree relatives.  Social History: She is single, she is disabled do to bipolar disease, she smoke cigarettes (age 68, 11/2  ppd), she does not drink alcohol, she drinks 3 caffeinated beverages a day, she smokes marijuana.  Review of Systems       Intentional weight loss, fingers go numb, anxiety, insomnia.Otherwise as stated in the history of present illness and negative for all other systems.  Vital Signs:  Patient profile:   40 year old female Height:      62 inches Weight:      163 pounds BMI:     29.92 Pulse rate:   71 / minute Resp:     16 per minute BP sitting:   122 / 80  (right arm)  Vitals Entered  By: Marrion Coy, CNA (October 19, 2009 2:24 PM)  Physical Exam  General:  Well developed, well nourished, in no acute distress. Head:  normocephalic and atraumatic Eyes:  PERRLA/EOM intact; conjunctiva and lids normal. Mouth:  Teeth, gums and palate normal. Oral mucosa normal. Neck:  Neck supple, no JVD. No masses, thyromegaly or abnormal cervical nodes. Chest Wall:  no deformities or breast masses noted Lungs:  Clear bilaterally to auscultation and percussion. Abdomen:  Bowel sounds positive; abdomen soft and non-tender without masses, organomegaly, or hernias noted. No hepatosplenomegaly. Msk:  Back normal, normal gait. Muscle strength and tone normal. Extremities:  No clubbing or cyanosis. Neurologic:  Alert and oriented x 3. Skin:  Intact without lesions or rashes. Cervical Nodes:  no significant adenopathy Axillary Nodes:  no significant adenopathy Inguinal Nodes:  no significant adenopathy Psych:  Normal affect.   Detailed Cardiovascular Exam  Neck    Carotids: Carotids full and equal bilaterally without bruits.      Neck Veins: Normal, no JVD.    Heart    Inspection: no deformities or lifts noted.      Palpation: normal PMI with no thrills palpable.      Auscultation: regular rate and rhythm, S1, S2 without murmurs, rubs, gallops, or clicks.    Vascular    Abdominal Aorta: no palpable masses, pulsations, or audible bruits.      Femoral Pulses: normal femoral pulses bilaterally.      Pedal Pulses: normal pedal pulses bilaterally.      Radial Pulses: normal radial pulses bilaterally.      Peripheral Circulation: no clubbing, cyanosis, or edema noted with normal capillary refill.     EKG  Procedure date:  10/16/2009  Findings:      sinus rhythm, rate 70, axis within normal limits, intervals within normal limits, anterior T-wave inversions consistent with possible ischemia, nonspecific inferior T-wave changes.  Impression & Recommendations:  Problem # 1:   SYNCOPE (ICD-780.2) The patient has these as described. I will start with an event monitor and an echocardiogram. Further evaluation will be based on these results.  Problem # 2:  CHEST PAIN (ICD-786.50) She has chest pain and an abnormal EKG with a significant family history. She will have an exercise perfusion study to rule out ischemia.  Problem # 3:  TOBACCO ABUSE (ICD-305.1) We discussed the need to stop smoking though she does not think she could at this time.  Other Orders: Event (Event) Nuclear Stress Test (Nuc Stress Test) Echocardiogram (Echo)  Patient Instructions: 1)  Your physician recommends that you schedule a follow-up appointment in:  2)  Your physician recommends that you continue on your current medications as directed. Please refer to the Current Medication list given to you today. 3)  Your physician has requested that you have an echocardiogram.  Echocardiography is a painless test that uses  sound waves to create images of your heart. It provides your doctor with information about the size and shape of your heart and how well your heart's chambers and valves are working.  This procedure takes approximately one hour. There are no restrictions for this procedure. 4)  Your physician has recommended that you wear an event monitor.  Event monitors are medical devices that record the heart's electrical activity. Doctors most often use these monitors to diagnose arrhythmias. Arrhythmias are problems with the speed or rhythm of the heartbeat. The monitor is a small, portable device. You can wear one while you do your normal daily activities. This is usually used to diagnose what is causing palpitations/syncope (passing out). 5)  Your physician has requested that you have an exercise stress myoview.  For further information please visit [email or web address]  Please follow instruction sheet, as given.

## 2010-09-30 NOTE — Letter (Signed)
Summary: Generic Letter  Architectural technologist, Main Office  [address]   [place], [place] [address]   Phone: [phone]  Fax: [phone]          December 16, 2009 MRN: [id number]      [facility] [address] [place], [place]  [address]      Dear Ms. Hilyer,   I have attempted several times to contact you about the results of your monitor.  The results are normal sinus rhythm.  If you have questions please call.      Sincerely,      Charolotte Capuchin, RN for Dr. Rollene Rotunda  This letter has been electronically signed by your physician.

## 2010-09-30 NOTE — Letter (Signed)
Summary: [facility]   Imported By: Arta Bruce 03/24/2010 15:16:38  _____________________________________________________________________  External Attachment:    Type:   Image     Comment:   External Document

## 2010-10-27 ENCOUNTER — Emergency Department (HOSPITAL_COMMUNITY)
Admission: EM | Admit: 2010-10-27 | Discharge: 2010-10-27 | Discharge status: Against Medical Advice | Payer: Medicare Other | Attending: Emergency Medicine | Admitting: Emergency Medicine

## 2010-10-27 ENCOUNTER — Emergency Department (HOSPITAL_COMMUNITY): Payer: Medicare Other

## 2010-10-27 DIAGNOSIS — N760 Acute vaginitis: Secondary | ICD-10-CM | POA: Insufficient documentation

## 2010-10-27 DIAGNOSIS — M549 Dorsalgia, unspecified: Secondary | ICD-10-CM | POA: Insufficient documentation

## 2010-10-27 DIAGNOSIS — B9689 Other specified bacterial agents as the cause of diseases classified elsewhere: Secondary | ICD-10-CM | POA: Insufficient documentation

## 2010-10-27 DIAGNOSIS — A499 Bacterial infection, unspecified: Secondary | ICD-10-CM | POA: Insufficient documentation

## 2010-10-27 LAB — URINE MICROSCOPIC-ADD ON

## 2010-10-27 LAB — URINALYSIS, ROUTINE W REFLEX MICROSCOPIC
Ketones, ur: NEGATIVE mg/dL
Specific Gravity, Urine: 1.019 (ref 1.005–1.030)
Urine Glucose, Fasting: NEGATIVE mg/dL
pH: 7 (ref 5.0–8.0)

## 2010-10-27 LAB — WET PREP, GENITAL

## 2010-10-29 LAB — URINE CULTURE
Colony Count: 65000
Culture  Setup Time: 201203010341

## 2010-11-14 LAB — COMPREHENSIVE METABOLIC PANEL
ALT: 18 U/L (ref 0–35)
AST: 14 U/L (ref 0–37)
Albumin: 3.5 g/dL (ref 3.5–5.2)
Calcium: 9 mg/dL (ref 8.4–10.5)
Creatinine, Ser: 0.86 mg/dL (ref 0.4–1.2)
GFR calc Af Amer: >60 mL/min (ref >60)
GFR calc non Af Amer: >60 mL/min (ref >60)
Sodium: 140 mEq/L (ref 135–145)
Total Protein: 6 g/dL (ref 6.0–8.3)

## 2010-11-14 LAB — URINALYSIS, ROUTINE W REFLEX MICROSCOPIC
Bilirubin Urine: NEGATIVE
Glucose, UA: NEGATIVE mg/dL
Glucose, UA: NEGATIVE mg/dL
Ketones, ur: NEGATIVE mg/dL
Nitrite: NEGATIVE
Protein, ur: NEGATIVE mg/dL
Specific Gravity, Urine: 1.004 — ABNORMAL LOW (ref 1.005–1.030)
Urobilinogen, UA: 0.2 mg/dL (ref 0.0–1.0)
pH: 7.5 (ref 5.0–8.0)

## 2010-11-14 LAB — DIFFERENTIAL
Eosinophils Absolute: 0.2 10*3/uL (ref 0.0–0.7)
Eosinophils Absolute: 0.3 10*3/uL (ref 0.0–0.7)
Eosinophils Relative: 2 % (ref 0–5)
Eosinophils Relative: 3 % (ref 0–5)
Lymphocytes Relative: 14 % (ref 12–46)
Lymphocytes Relative: 14 % (ref 12–46)
Lymphs Abs: 1.3 10*3/uL (ref 0.7–4.0)
Lymphs Abs: 1.3 10*3/uL (ref 0.7–4.0)
Monocytes Absolute: 0.4 10*3/uL (ref 0.1–1.0)
Monocytes Relative: 4 % (ref 3–12)
Monocytes Relative: 5 % (ref 3–12)
Neutrophils Relative %: 78 % — ABNORMAL HIGH (ref 43–77)

## 2010-11-14 LAB — CBC
HCT: 38.8 % (ref 36.0–46.0)
MCHC: 34.2 g/dL (ref 30.0–36.0)
MCV: 100.2 fL — ABNORMAL HIGH (ref 78.0–100.0)
MCV: 101.1 fL — ABNORMAL HIGH (ref 78.0–100.0)
Platelets: 312 10*3/uL (ref 150–400)
Platelets: 321 10*3/uL (ref 150–400)
RBC: 3.84 MIL/uL — ABNORMAL LOW (ref 3.87–5.11)
RDW: 13.2 % (ref 11.5–15.5)
WBC: 9.5 10*3/uL (ref 4.0–10.5)

## 2010-11-14 LAB — POCT CARDIAC MARKERS
CKMB, poc: <1 ng/mL — ABNORMAL LOW (ref 1.0–8.0)
Myoglobin, poc: 19.3 ng/mL (ref 12–200)
Troponin i, poc: <0.05 ng/mL (ref 0.00–0.09)

## 2010-11-14 LAB — BASIC METABOLIC PANEL
BUN: 6 mg/dL (ref 6–23)
Chloride: 107 mEq/L (ref 96–112)
GFR calc Af Amer: >60 mL/min (ref >60)
GFR calc non Af Amer: >60 mL/min (ref >60)
Potassium: 3.9 mEq/L (ref 3.5–5.1)

## 2010-11-14 LAB — APTT: aPTT: 29 seconds (ref 24–37)

## 2010-11-14 LAB — D-DIMER, QUANTITATIVE: D-Dimer, Quant: <0.22 ug/mL-FEU (ref 0.00–0.48)

## 2010-11-14 LAB — ABO/RH: ABO/RH(D): O POS

## 2010-11-14 LAB — URINE MICROSCOPIC-ADD ON

## 2010-11-14 LAB — LIPASE, BLOOD: Lipase: 18 U/L (ref 11–59)

## 2010-11-14 LAB — TYPE AND SCREEN
ABO/RH(D): O POS
Antibody Screen: NEGATIVE

## 2010-11-14 LAB — POCT PREGNANCY, URINE: Preg Test, Ur: NEGATIVE

## 2010-11-21 LAB — COMPREHENSIVE METABOLIC PANEL
ALT: 24 U/L (ref 0–35)
AST: 18 U/L (ref 0–37)
Albumin: 3.5 g/dL (ref 3.5–5.2)
CO2: 25 mEq/L (ref 19–32)
Chloride: 110 mEq/L (ref 96–112)
GFR calc Af Amer: >60 mL/min (ref >60)
GFR calc non Af Amer: >60 mL/min (ref >60)
Sodium: 138 mEq/L (ref 135–145)
Total Bilirubin: 0.4 mg/dL (ref 0.3–1.2)

## 2010-11-21 LAB — TRICYCLICS SCREEN, URINE: TCA Scrn: NOT DETECTED

## 2010-11-21 LAB — URINALYSIS, ROUTINE W REFLEX MICROSCOPIC
Glucose, UA: NEGATIVE mg/dL
Ketones, ur: NEGATIVE mg/dL
pH: 6.5 (ref 5.0–8.0)

## 2010-11-21 LAB — CBC
HCT: 45.4 % (ref 36.0–46.0)
Platelets: 281 10*3/uL (ref 150–400)
RBC: 4.31 MIL/uL (ref 3.87–5.11)
RDW: 13.9 % (ref 11.5–15.5)
WBC: 10.5 10*3/uL (ref 4.0–10.5)
WBC: 11.9 10*3/uL — ABNORMAL HIGH (ref 4.0–10.5)

## 2010-11-21 LAB — RAPID URINE DRUG SCREEN, HOSP PERFORMED
Amphetamines: NOT DETECTED
Barbiturates: NOT DETECTED

## 2010-11-21 LAB — URINE MICROSCOPIC-ADD ON

## 2010-11-21 LAB — POCT I-STAT, CHEM 8
BUN: 11 mg/dL (ref 6–23)
Calcium, Ion: 1.13 mmol/L (ref 1.12–1.32)
Glucose, Bld: 131 mg/dL — ABNORMAL HIGH (ref 70–99)
HCT: 47 % — ABNORMAL HIGH (ref 36.0–46.0)
TCO2: 21 mmol/L (ref 0–100)

## 2010-11-21 LAB — BASIC METABOLIC PANEL
BUN: 6 mg/dL (ref 6–23)
Calcium: 9 mg/dL (ref 8.4–10.5)
GFR calc non Af Amer: >60 mL/min (ref >60)
Potassium: 4.5 mEq/L (ref 3.5–5.1)

## 2010-11-21 LAB — DIFFERENTIAL
Basophils Absolute: 0 10*3/uL (ref 0.0–0.1)
Eosinophils Relative: 1 % (ref 0–5)
Lymphocytes Relative: 8 % — ABNORMAL LOW (ref 12–46)
Lymphs Abs: 0.9 10*3/uL (ref 0.7–4.0)
Neutro Abs: 10.4 10*3/uL — ABNORMAL HIGH (ref 1.7–7.7)
Neutrophils Relative %: 87 % — ABNORMAL HIGH (ref 43–77)

## 2010-11-21 LAB — HEPATIC FUNCTION PANEL
ALT: 23 U/L (ref 0–35)
AST: 21 U/L (ref 0–37)
Albumin: 3.8 g/dL (ref 3.5–5.2)
Total Protein: 6.7 g/dL (ref 6.0–8.3)

## 2010-11-21 LAB — LITHIUM LEVEL: Lithium Lvl: 0.93 mEq/L (ref 0.80–1.40)

## 2010-11-21 LAB — POCT PREGNANCY, URINE
Preg Test, Ur: NEGATIVE
Preg Test, Ur: NEGATIVE

## 2010-11-21 LAB — ETHANOL
Alcohol, Ethyl (B): <5 mg/dL (ref 0–10)
Alcohol, Ethyl (B): <5 mg/dL (ref 0–10)

## 2010-11-21 LAB — ACETAMINOPHEN LEVEL: Acetaminophen (Tylenol), Serum: <10 ug/mL — ABNORMAL LOW (ref 10–30)

## 2010-12-02 LAB — URINALYSIS, ROUTINE W REFLEX MICROSCOPIC
Glucose, UA: NEGATIVE mg/dL
Ketones, ur: NEGATIVE mg/dL
Nitrite: NEGATIVE
pH: 6 (ref 5.0–8.0)

## 2010-12-02 LAB — COMPREHENSIVE METABOLIC PANEL
ALT: 35 U/L (ref 0–35)
AST: 25 U/L (ref 0–37)
Alkaline Phosphatase: 72 U/L (ref 39–117)
CO2: 25 mEq/L (ref 19–32)
Calcium: 8.2 mg/dL — ABNORMAL LOW (ref 8.4–10.5)
GFR calc Af Amer: >60 mL/min (ref >60)
Glucose, Bld: 107 mg/dL — ABNORMAL HIGH (ref 70–99)
Potassium: 3.5 mEq/L (ref 3.5–5.1)
Sodium: 138 mEq/L (ref 135–145)
Total Protein: 5.7 g/dL — ABNORMAL LOW (ref 6.0–8.3)

## 2010-12-02 LAB — PREGNANCY, URINE: Preg Test, Ur: NEGATIVE

## 2010-12-02 LAB — HEMOCCULT GUIAC POC 1CARD (OFFICE): Fecal Occult Bld: POSITIVE

## 2010-12-02 LAB — DIFFERENTIAL
Basophils Relative: 0 % (ref 0–1)
Eosinophils Absolute: 0.2 10*3/uL (ref 0.0–0.7)
Eosinophils Relative: 1 % (ref 0–5)
Lymphs Abs: 1.2 10*3/uL (ref 0.7–4.0)
Monocytes Relative: 4 % (ref 3–12)

## 2010-12-02 LAB — CBC
Hemoglobin: 12.1 g/dL (ref 12.0–15.0)
MCHC: 33.7 g/dL (ref 30.0–36.0)
RBC: 3.57 MIL/uL — ABNORMAL LOW (ref 3.87–5.11)
RDW: 13.9 % (ref 11.5–15.5)

## 2011-01-11 NOTE — H&P (Signed)
[name], Walls            ACCOUNT NO.:  [id number]   MEDICAL RECORD NO.:  [id number]          PATIENT TYPE:  EMS   LOCATION:  ED                           FACILITY[facility]  [facility]   PHYSICIAN:  Wilson Singer, M.D.DATE OF BIRTH:  [birth year]   DATE OF ADMISSION:  04/23/2007  DATE OF DISCHARGE:                              HISTORY & PHYSICAL   HISTORY OF PRESENT ILLNESS:  This is a 40 year old lady with a history  of bipolar disorder who now presents with an overdose of lithium tablets  300 mg #45 to 50 taken with [employer]. She took these tablets  approximately 3 hours ago. She feels well at the present time with no  neurological symptoms, GI symptoms. She says she was suicidal at the  time. She has been known to the psychiatric service for some time.   PAST SURGICAL HISTORY:  No serious illnesses or operations.   PAST MEDICAL HISTORY:  1. Gastroesophageal reflux disease.  2. Irritable bowel.  3. History of melanoma in situ.  4. Bipolar disorder.   SOCIAL HISTORY:  She is single and lives alone. She smokes 1 pack of  cigarettes per day. She does not drink alcohol. She is unemployed. She  apparently had an argument with her boyfriend recently and finances were  an issue which apparently triggered off this episode.   MEDICATIONS:  1. Clonazepam 1 mg at bedtime.  2. Lithium 300 mg 2 tablets daily.  3. Risperdal 1.5 tablet at bedtime.  4. Celexa 400 mg in the morning and 20 at noon, but she does not take      this as prescribed.  5. Nexium 40 mg b.i.d.  6. Zymine 1 tablet daily.   ALLERGIES:  APPARENTLY CODEINE AND EPINEPHRINE.   REVIEW OF SYSTEMS:  Apart from the symptoms mentioned above, there are  no other symptoms referable to all systems reviewed.   FAMILY HISTORY:  Noncontributory.   PHYSICAL EXAMINATION:  VITAL SIGNS: She is afebrile. Blood pressure  134/88, pulse 92, saturation 96%.  GENERAL APPEARANCE: She looks clinically well.  NEUROLOGICAL: She is alert and  oriented with no focal neurological  signs. She is not drowsy at all.  CARDIOVASCULAR: Heart sounds are presently normal without murmurs.  LUNGS: The lung fields are clear.  ABDOMEN: The abdomen is soft and nontender.   INVESTIGATIONS:  Alcohol level less than 5. Salicylate level less than  4.0. Acetaminophen level less than 10.0. lithium level 0.84 with the  upper limit upper range of being 1.4. Sodium 136, potassium 3.6,  bicarbonate 22, BUN 3, creatinine 0.75, calcium 8.4. Hemoglobin 14.1,  white blood cell count 9.5, platelets 388.   IMPRESSION:  1. Lithium overdose by history.  2. Bipolar disorder.  3. Suicidal ideation.   PLAN:  1. Admit.  2. Intravenous fluid.  3. Follow lithium levels.  4. Psychiatric consultation.   Further recommendations will depend on hospital progress.      Wilson Singer, M.D.  Electronically Signed     NCG/MEDQ  D:  04/23/2007  T:  04/24/2007  Job:  [id number]

## 2011-01-11 NOTE — Discharge Summary (Signed)
[name], [name]            ACCOUNT NO.:  [id number]   MEDICAL RECORD NO.:  [id number]          PATIENT TYPE:  INP   LOCATION:  1415                         FACILITY:  [facility]   PHYSICIAN:  Lonia Blood, M.D.      DATE OF BIRTH:  03/30/1971   DATE OF ADMISSION:  04/23/2007  DATE OF DISCHARGE:  04/25/2007                               DISCHARGE SUMMARY   PRIMARY CARE PHYSICIAN:  The patient is unassigned.   DISCHARGE DIAGNOSES:  1. Suicide attempt.  2. Drug overdose, apparent lithium overdose.  3. Bipolar disorder.  4. Gastroesophageal reflux disease.  5. Irritable bowel.  6. History of melanoma in situ.   DISCHARGE MEDICATIONS:  1. Klonopin 1 mg p.o. q. H.s.  2. Nicotine patch for 2 mg daily.  3. Protonix 40 mg daily.   DISPOSITION:  Patient is being discharged to inpatient psychiatric unit  per Dr. Providence Crosby recommendation.   PROCEDURE PERFORMED THIS ADMISSION:  Chest x-ray on 04/23/2007 was  essentially normal.   CONSULTATIONS:  Dr. Antonietta Breach, psychiatry.   HISTORY AND PHYSICAL:  Please refer to the dictation by Dr. Lilly Cove.  This is a 40 year old female with a history of bipolar  disorder presenting with an overdose of Lithium tablets.  The patient  took 45 tablets to 60 tablets of 300 mg Lithium.  She took them 3 hours  prior to arrival to the emergency room. She was attempting suicide.  In  the emergency room she was found to be alert and oriented.  Her other  labs were essentially normal. She was admitted mainly for suicide  attempt for medical clearance and currently cleared medically.   HOSPITAL COURSE:  1. Suicide attempt.  The patient was placed on suicide precaution.      Psychiatry was consulted and Dr. Jeanie Sewer evaluated the patient      and recommended inpatient psychiatry.  She was continued on      Klonopin mainly at night and evaluation for suicide attempt will be      done in an inpatient psychiatric unit.  2. Lithium overdose.   Her Lithium level was followed serially in the      hospital.  Every q. 8 hours, Lithium levels were checked.  The      level rose to only 1.2 so far and started declining.  At the moment      her Lithium level is less than 0.25 which means it has cleared the      system.  3. Tobacco abuse:  Patient was placed on nicotine patch while in the      hospital and she continues on that.  4. Bipolar disorder/depression:  Again, treatment was referred to      psychiatry and the patient will undergo      inpatient psychiatric treatment.  5. Gastroesophageal reflux disease.  Patient was maintained on      Protonix in the hospital.  Otherwise, further treatment will be      undertaken in the inpatient psychiatric unit.      Lonia Blood, M.D.  Electronically Signed     LG/MEDQ  D:  04/25/2007  T:  04/25/2007  Job:  [id number]

## 2011-01-11 NOTE — Consult Note (Signed)
[name], Walls            ACCOUNT NO.:  [id number]   MEDICAL RECORD NO.:  [id number]          PATIENT TYPE:  INP   LOCATION:  1415                         FACILITY:  [facility]   PHYSICIAN:  Antonietta Breach, M.D.  DATE OF BIRTH:  [birth year]   DATE OF CONSULTATION:  04/24/2007  DATE OF DISCHARGE:                                 CONSULTATION   For purposes of facilitating transfer to another hospital.   REQUESTING PHYSICIAN:  [facility].   REASON FOR CONSULTATION:  Lithium overdose.   HISTORY OF PRESENT ILLNESS:  [name] is a 40 year old  female admitted to the [facility] on April 23, 2007 after an  intentional overdose on lithium.   She does acknowledge that she was trying to kill herself. She stopped  her antidepressant 6 weeks ago and has developed approximately 3 weeks  of depressed mood, poor energy, poor concentration, anhedonia and  suicidal thoughts.   The patient also reports that she is approaching the anniversary of her  father's death in 09-04-2023 and this has been bringing her down as well.   PAST PSYCHIATRIC HISTORY:  The patient was admitted to the [facility] for multiple times, the last one in the past  medical record review was in June 2007. She was diagnosed with bipolar  disorder, depression. She was discharged on trazodone 100 mg q.h.s.,  Celexa 40 mg daily, Risperdal 1 mg 1-1/2 tablets q.h.s., lithium  carbonate 300 mg q.a.m. and at h.s. Clonazepam 1 mg t.i.d.   FAMILY PSYCHIATRIC HISTORY:  None known.   SOCIAL HISTORY:  The patient is single. She has no children. She is not  married. She used to be a Armed forces training and education officer. She has a history of  marijuana abuse, no alcohol abuse.   PAST MEDICAL HISTORY:  Gastroesophageal reflux disease, irritable bowel  syndrome, history of melanoma in situ.   LABORATORY DATA:  The patient's initial lithium level was 0.84, it rose  to 1.21 and then began to fall again  and was at 0.3 this morning, August  26. A comprehensive metabolic panel is showing slight elevation of  glucose at 126, SGOT 19, SGPT 23, albumin 2.9, WBC 9.2, hemoglobin 13.1,  platelet count 370. Drug screen was positive for tetrahydrocannabinol,  positive for benzodiazepines. Urine HCG negative, aspirin negative,  alcohol negative, Tylenol level negative.   REVIEW OF SYSTEMS:  Noncontributory.   PHYSICAL EXAMINATION:  VITAL SIGNS:  Temperature 98, pulse 79,  respirations 14, blood pressure 126/75, O2 saturation on room air 96%.   MENTAL STATUS EXAM:  [name] has a very constricted affect with  tears, her mood is very depressed. Her eye contact is good. She is  cooperative with the interview. She is oriented in all spheres. Her  memory is intact to immediate, recent and remote. Speech involves normal  rate and prosody. Thought process, logical, coherent, goal direct, no  looseness of association or thought content. She has suicidal ideation  and hopelessness. She does acknowledge that this was a suicide attempt.  She has no hallucinations or delusions. She has no thoughts of  harming  others. Her insight is partial, her judgment is impaired.   ASSESSMENT:  AXIS I:  296.4, bipolar disorder not otherwise specified.  Depressed.  AXIS II:  Deferred.  AXIS III:  See above.  AXIS IV:  Primary support group.  AXIS V:  30.   RECOMMENDATIONS:  1. Once medically clear would admit this patient to a psychiatric      hospital for further evaluation and treatment.  2. Will defer her psychotropic medication changes at this time.  3. Would continue the sitter for suicide precautions.      Antonietta Breach, M.D.  Electronically Signed     JW/MEDQ  D:  04/25/2007  T:  04/25/2007  Job:  [id number]

## 2011-01-14 NOTE — H&P (Signed)
[name], [name]              ACCOUNT NO.:  [id number]   MEDICAL RECORD NO.:  [id number]          PATIENT TYPE:  INP   LOCATION:  1610                         FACILITY:  [facility]   PHYSICIAN:  Velora Heckler, MD      DATE OF BIRTH:  26-Sep-1970   DATE OF ADMISSION:  05/22/2005  DATE OF DISCHARGE:                                HISTORY & PHYSICAL   REFERRING PHYSICIAN:  Dr. Lorre Nick, emergency department.   CHIEF COMPLAINT:  Abdominal pain.   HISTORY OF PRESENT ILLNESS:  The patient is a 40 year old white female from  [place], [place], presents with 4 day history of abdominal pain in  the right upper quadrant.  The patient had been seen by my partner, Dr.  Harriette Bouillon, on Friday, May 20, 2005.  She was referred by Dr.  Bosie Clos from [facility] for evaluation.  Dr. Luisa Hart sought to  admit the patient to the hospital for a work-up and surgery on Friday,  September 22.  The patient refused due to family concerns.  She now returns  to the emergency department.  The patient was seen by Dr. Lorre Nick at  [facility] Emergency Department.  She was found to have an abnormal white  cell differential.  She underwent CT scan of the abdomen and pelvis with  findings of acute cholecystitis.  General surgery is now consulted for  management.   PAST MEDICAL HISTORY:  1.  History of gastroesophageal reflux disease.  2.  History of irritable bowel syndrome.  3.  History of in situ melanoma.  4.  History of nephrolithiasis.  5.  History of bipolar disorder.   MEDICATIONS:  Celexa, Nexium, hyoscyamine, Risperdal, Neurontin, trazodone.   ALLERGIES:  1.  CODEINE.  2.  EPINEPHRINE.   SOCIAL HISTORY:  The patient is single.  She lives in [place].  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  She smokes a pack  of cigarettes a day.  She quit drinking alcohol in 2005.   REVIEW OF SYSTEMS:  Fifteen system review without significant  other findings  except as noted above.   FAMILY HISTORY:  Noncontributory.   PHYSICAL EXAMINATION:  GENERAL:  A 40 year old, well-developed, well-  nourished white female on a stretcher in the emergency department.  VITAL SIGNS:  Temp 97.9, pulse 68, respirations 20, blood pressure 122/78  HEENT:  Normocephalic, atraumatic.  Sclerae clear.  Conjunctivae clear.  Pupils equal and reactive.  Dentition good.  Mucous membranes moist.  There  is a piercing in the nares.  Voice quality is normal.  NECK:  Supple, nontender, without mass.  Thyroid was normal without  nodularity.  There is no left adenopathy.  LUNGS:  Clear to auscultation bilaterally without rales, rhonchi, or wheeze.  CARDIAC:  Regular rate and rhythm without murmur.  Peripheral pulses are  full.  ABDOMEN:  Soft without distention.  There is a circular scar on the left mid  abdominal wall consistent with excision of skin lesion.  There is no sign of  hernia.  There is no hepatosplenomegaly.  There is  mild tenderness to  palpation in the right upper quadrant.  There is mild pain with deep  inspiration consistent with a Murphy's sign.  EXTREMITIES:  Nontender without edema.  NEUROLOGIC:  The patient is alert and oriented without focal deficit.   LABORATORY STUDIES:  White count 6.6, hemoglobin 12.0, hematocrit 34.9%,  platelet count 286,000.  Differential shows 80% neutrophils, 14%  lymphocytes, 5% monocytes.  Chemistry profile is notable for a sodium of  129, potassium 3.3, chloride 98, bicarb 29, glucose 101, creatinine 0.7.  Liver function tests are normal.  Lipase is normal.  Urinalysis notable for  trace leukocyte esterase.   RADIOGRAPHIC STUDIES:  CT scan abdomen and pelvis with findings of acute  cholecystitis.   IMPRESSION:  1.  Acute cholecystitis.  2.  Gastroesophageal reflux disease.  3.  Bipolar disorder.  4.  Hemorrhoids.   PLAN:  The [facility].  She  will  be seen in follow up by my partner, Dr. Harriette Bouillon.  I suspect she  will likely require cholecystectomy during this admission.  Intravenous  antibiotics will be started.  Medications for pain control will be  maintained.      Velora Heckler, MD  Electronically Signed     TMG/MEDQ  D:  05/22/2005  T:  05/22/2005  Job:  [id number]   cc:   Thomas A. Cornett, M.D.  [address]  [place] [address]   Shirley Friar, MD  Fax: [phone]

## 2011-01-14 NOTE — Discharge Summary (Signed)
[name], [name]              ACCOUNT NO.:  [id number]   MEDICAL RECORD NO.:  [id number]          PATIENT TYPE:  IPS   LOCATION:  0506                          [facility]ACILITY:  [facility]   PHYSICIAN:  Geoffery Lyons, M.D.      DATE OF BIRTH:  02-19-1971   DATE OF ADMISSION:  03/29/2005  DATE OF DISCHARGE:  04/03/2005                                 DISCHARGE SUMMARY   CHIEF COMPLAINT AND PRESENT ILLNESS:  This was the first admission to [facility] for this 40 year old single white female voluntarily  admitted.  History of depression.  Endorsed that she tried to take her  life.  Stated that she was in a hotel and overdosed on multiple medication,  trazodone, Lexapro, Nexium and Neurontin.  She has been having difficulty  functioning.  She was not sleeping well.  Her father is ill.  Has been  living with her parents.  Stated that her mother does not want her to  return.  She was also under stress because she is helping the police,  providing information on drug dealers that she knows.   PAST PSYCHIATRIC HISTORY:  First time at [facility].  Sponsored by  [facility].  At the age of 38, she reports an overdose.  No current  treatment.   ALCOHOL/DRUG HISTORY:  Denies any alcohol.  Has been smoking marijuana.   MEDICAL HISTORY:  Esophagitis and cancerous skin lesions.   MEDICATIONS:  Has been on Neurontin 400 mg four times a day, Lexapro 20 mg  per day, Vistaril 50 mg four times a day, Nexium 40 mg twice a day,  __________, trazodone 50 mg, 2 at night, Klonopin 0.5 mg three times a day.   PHYSICAL EXAMINATION:  Performed and failed to show any acute findings.   LABORATORY DATA:  CBC within normal limits.  Blood chemistry with glucose  112.  Liver enzymes with SGOT 32, SGPT 37, total bilirubin 0.3, TSH 2.955.  Drug screen positive for marijuana.   MENTAL STATUS EXAM:  Fully alert, cooperative female.  Good eye contact.  Speech was rapid and tangential.  Endorsed  that she was very stressed.  Affect was labile.  Became tearful towards the end of the interview.  Thought processes with some questionable paranoia.  Does focus on her stress  and her medical problems.  Cognition was well-preserved.   ADMISSION DIAGNOSES:  AXIS I:  Rule out bipolar disorder.  Marijuana abuse.  AXIS II:  No diagnosis.  AXIS III:  Esophagitis, cancerous skin lesions, irritable bowel syndrome.  AXIS IV:  Moderate.  AXIS V:  GAF upon admission 35; highest GAF in the last year 65.   HOSPITAL COURSE:  She was admitted.  She was started in individual and group  psychotherapy.  She was given Klonopin 0.5 mg three times a day, Lexapro 5  mg daily, Neurontin 300 mg twice a day, Nexium 40 mg twice a day, Vistaril  50 mg four times a day as needed for anxiety, hyoscyamine 0.375 mg twice a  day.  The Lexapro was discontinued.  She was placed on  Risperdal 0.25 mg at  the time of the initial evaluation, Risperdal 0.5 mg at night.  Ambien was  eventually discontinued and she was placed on trazodone 100 mg per day.  She  endorsed she has been under a lot of stress coming from taking care of her  ill father.  Also endorsed that she is involved in an investigation by the  police, drug-related.  Endorsed increased stress.  The father, she feels, is  going to die in the next week.  Hospice is involved.  She was just told by  her mother that she was not going to be able to go back to the house, so she  is homeless.  Increased anxiety, increased depression, labile affect,  tearful, then laughing.  Decreased sleep, somewhat restless, some pressure  of speech but can refocus.  She was indeed started on trazodone.  She  continued to say she was having a hard time.  Also dealing with her period.  Depressed as she did not feel the support from the family or friends.  Boyfriend was 10 years younger and they are having conflicts, tearful.  She  found out that the grandmother was going to give her  some money for a  deposit on an apartment.  Became very elated due to the good news.  On  August 4th, she was endorsing a difficult time with her OCD.  Endorsed  impulsive behavior.  She said she did well on her Celexa, would like to go  back on it.  She was switched from Celexa to Lexapro.  She said that she was  __________ same thing, did not do well on it.  Did really well on Celexa.  She was started on Celexa 20 mg per day.  Later found out that the  grandmother was not going to help her with the money.  Found out that the  boyfriend is not supportive and she is on her own.  More upset, depressed,  tearful, sense of hopelessness and helplessness, overwhelming sense of being  alone.  She worked on concerns and Pharmacologist and, August 6th, she was in  full contact with reality.  There were no suicidal ideation, no homicidal  ideation, no hallucinations, no delusions.  Felt better.  Finally, the  mother was going to get her the money to get into the apartment.  She was  willing to pursue further treatment.  Felt the medication was agreeing with  her and she was willing to pursue them.   DISCHARGE DIAGNOSES:  AXIS I:  Mood disorder not otherwise specified.  Marijuana abuse.  AXIS II:  No diagnosis.  AXIS III:  Esophagitis, cancerous skin lesion, irritable bowel syndrome.  AXIS IV:  Moderate.  AXIS V:  GAF upon discharge 50-55.   DISCHARGE MEDICATIONS:  1.  Klonopin 0.5 mg three times a day.  2.  Neurontin 300 mg twice a day.  3.  Protonix 40 mg, 2 daily.  4.  Levsinex 0.375 mg twice a day.  5.  Risperdal 0.5 mg at 11 p.m.  6.  Trazodone 100 mg at night as needed for sleep.  7.  Celexa 20 mg per day.  8.  Vistaril 50 mg, 1 four times a day as needed for anxiety.   FOLLOW UP:  Dr. Lang Snow.      Geoffery Lyons, M.D.  Electronically Signed     IL/MEDQ  D:  04/26/2005  T:  04/27/2005  Job:  [id number]

## 2011-01-14 NOTE — Discharge Summary (Signed)
[name], [name]            ACCOUNT NO.:  [id number]   MEDICAL RECORD NO.:  [id number]          PATIENT TYPE:  IPS   LOCATION:  0303                          FACILITY:  [facility]   PHYSICIAN:  Jasmine Pang, M.D. DATE OF BIRTH:  1971/04/23   DATE OF ADMISSION:  01/30/2006  DATE OF DISCHARGE:  02/02/2006                                 DISCHARGE SUMMARY   IDENTIFICATION:  The patient was a 40 year old, single, Caucasian female who  was admitted on a voluntary basis to my service on January 30, 2006.   HISTORY OF PRESENT ILLNESS:  The patient presented with one-month of  unstable mood.  She has been having episodes of panic and anxiety and  feeling some paranoia.  She states she feels like she is in a tunnel and  unable to hear what is going on.  She has gone 3 days without sleep and has  been up doing house work.  She states she cannot sleep.  She had begun to  think of shooting herself and had access to her father's gun.   The admission mental status exam fully alert, pleasant, cooperative,  Caucasian female with some affect lability.  Speech was normal.  Mood was  labile, depressed.  Thought processes clear suicidal thoughts with plan to  shoot self.  No homicidal ideation.  No psychosis.  History of paranoia with  guarded affect at times.  Cognitive exam intact x3.   PAST PSYCHIATRIC HISTORY:  1.  This is the second [name] admission for this patient.  She was here last      August 2006, for suicidal ideation and mood swings.  2.  She has a history of mood swings.  3.  She has a history of a prior overdose on __________  .  4.  She has a history of impulse ridden behavior.  She has had a history of      impulsivity, when her mood worsens.   PAST MEDICAL HISTORY:  The patient is seen at the [facility] for medical care.   MEDICAL PROBLEMS:  None.   MEDICATIONS:  1.  Neurontin 300 mg p.o. q.i.d.  2.  Clonidine 0.5 mg p.o. t.i.d.  3.  Risperdal 1 mg q.h.s.  4.  Celexa 40 mg every day.   DRUG ALLERGIES:  CODEINE.  EPINEPHRINE.  (question reaction).   ADMISSION DIAGNOSES:  Rule out bipolar one disorder, hypomanic, mixed state.   PHYSICAL EXAMINATION:  A complete physical exam was done by our nurse  practitioner, Kari Baars.  She was found to be a healthy young female  with no acute medical problems.   ADMISSION LABORATORIES:  CBC was within normal limits.  Basic Chem panel  within normal limits, except for a slightly elevated glucose at 105.  Hepatic profile within normal limits.  TSH was within normal limits at  1.564.  Urine drug screen was negative.  Urinalysis was within normal limits   HOSPITAL COURSE:  Upon admission, the patient was placed on Cogentin 2 mg  p.o. q.8 h. p.r.n. EPS.  She was also continued on her home medications of  Risperdal 1 mg p.o. q.h.s., trazodone 100 mg p.o. q.h.s., Neurontin 300 mg  p.o. q.i.d., Celexa 40 mg p.o. every day, Nexium 40 mg b.i.d., hyoscyamine  0.375 p.o. b.i.d.  On January 30, 2006, the patient was started on Klonopin 1 mg  now, then every 6 hours p.r.n. anxiety, panic attacks.  On January 31, 2006, the  patient was started on Klonopin standing dose 1 mg p.o. t.i.d..  Klonopin  0.5 mg p.o. b.i.d. and 0.5 mg p.o. q.6 h. p.r.n. agitation.  She was given  one Klonopin 0.5 STAT for severe anxiety.  She was also given Risperdal 0.5  mg STAT for severe anxiety.  Risperdal was increased to 1.5 mg p.o. q.h.s.  Lithium CR was begun at 300 mg p.o. q.a.m. and 300 mg q.h.s.  On February 01, 2006, Klonopin was increased to 0.5 mg p.o. t.i.d.  Ambien was discontinued  as a p.r.n. and instead Ambien 10 mg p.o. q.h.s. was ordered as a standing  dose.  The patient tolerated her medications well with no significant side  effects.   Upon first meeting the patient on January 31, 2006, she stated she was here due  to major depression with suicidal ideation.  She has had multiple stressors,  her parents poor health and her  involvement in caretaking for them, brother  is in [place] and she recently lost her job.  She has not been sleeping.  She  goes to the [facility] but Klonopin was  discontinued.  She decompensated to the point that she could not function.  She could not leave the house.  She was upset because she was functioning  okay on Klonopin.  She was not clear of why they discontinued it, since she  denies drug abuse.  She lives with her boyfriend who is very supportive but  is extremely worried about her.  On February 01, 2006, the patient stated she  felt nervous about starting the lithium.  She is worried it will not work.  She was also tearful and anxious and the Klonopin dose had been decreased to  b.i.d., and she did not feel it was as helpful as t.i.d. so I increased it.   On February 03, 2003, mental status had improved.  The patient was excited about  going home.  She was friendly, talkative, and cooperative.  She had good eye  contact with speech normal rate and flow.  Psychomotor was within normal  limits.  Mood was less depressed and anxious.  Affect wide range.  There was  no suicidal or homicidal ideation.  No self injurious behavior.  No auditory  or visual hallucinations.  No paranoia or delusions.  Thoughts were linear.  Thought content within normal limits.  Cognitive exam grossly within normal  limits.   The patient will go home to live with her boyfriend who has been quite  supportive.  A family session had been held with him and they appeared to  have a good relationship.   DISCHARGE DIAGNOSES:   AXIS I:  Bipolar disorder, depressed phase, severe without psychosis.   AXIS II:  None.   AXIS III:  1.  Gastroesophageal reflux disease.  2.  Erosive esophagitis by history.   AXIS IV:  Severe, (parents illness and caregiver stress, has lost job).   AXIS V:  GAF upon discharge was 42.  GAF upon admission was 30.  GAF highest past year 69-75.   POST HOSPITAL  CARE PLANS:  The  patient had no specific activity level or  dietary restrictions.   DISCHARGE MEDICATIONS:  1.  Trazodone 100 mg p.o. q.h.s.  2.  Celexa 40 mg every day.  3.  Nexium as directed by PCP.  4.  Hyoscyamine sulfate 0.375 p.o. b.i.d.  5.  Risperdal 1 mg, 1-1/2 pills p.o. q.h.s.  6.  Lithium carbonate 300 mg p.o. q.a.m. and at q.h.s.  7.  Clonazepam was increased to 1 mg p.o. t.i.d.   POST HOSPITAL CARE PLANS:  The patient has a follow-up appointment is the  [facility] on June 12 with Dr. Lang Snow.      Jasmine Pang, M.D.  Electronically Signed     BHS/MEDQ  D:  02/04/2006  T:  02/04/2006  Job:  [id number]

## 2011-01-14 NOTE — Op Note (Signed)
NAMETRU, LEOPARD              ACCOUNT NO.:  [id number]   MEDICAL RECORD NO.:  [id number]          PATIENT TYPE:  AMB   LOCATION:  ENDO                         FACILITY:  [facility]   PHYSICIAN:  Georgiana Spinner, M.D.    DATE OF BIRTH:  Feb 18, 1971   DATE OF PROCEDURE:  06/24/2004  DATE OF DISCHARGE:                                 OPERATIVE REPORT   PROCEDURE PERFORMED:  Colonoscopy.   ENDOSCOPIST:  Georgiana Spinner, M.D.   INDICATIONS FOR PROCEDURE:  Rectal bleeding and diarrhea.   ANESTHESIA:  Demerol 20 mg, Versed 2 mg.   DESCRIPTION OF PROCEDURE:  With the patient mildly sedated in the left  lateral decubitus position, the Olympus videoscopic colonoscope was inserted  in the rectum and passed under direct vision to the cecum.  We entered into  the terminal ileum which appeared normal and was photographed.  The  endoscope was then withdrawn taking circumferential views of the terminal  ileal mucosa all of which appeared normal.  We then photographed the  ileocecal valve and appendiceal orifice and slowly withdrew the colonoscope  taking circumferential views of the colonic mucosa, stopping to take random  biopsies along the way until we reached the rectum which appeared normal on  direct and showed hemorrhoids on retroflex view.  The endoscope was  straightened and withdrawn.  The patient's vital signs and pulse oximeter  remained stable.  The patient tolerated the procedure well without apparent  complications.   FINDINGS:  Hemorrhoids.  Otherwise unremarkable examination including the  terminal ileum.   PLAN:  Await biopsy report.  The patient will call me for results and follow  up with me as an outpatient.  See endoscopy note for further details of  follow-up.       GMO/MEDQ  D:  06/24/2004  T:  06/24/2004  Job:  [id number]

## 2011-01-14 NOTE — Op Note (Signed)
[name], [name]              ACCOUNT NO.:  [id number]   MEDICAL RECORD NO.:  [id number]          PATIENT TYPE:  AMB   LOCATION:  ENDO                         FACILITY:  [facility]   PHYSICIAN:  Georgiana Spinner, M.D.    DATE OF BIRTH:  1971/08/06   DATE OF PROCEDURE:  06/24/2004  DATE OF DISCHARGE:                                 OPERATIVE REPORT   PROCEDURE PERFORMED:  Upper endoscopy.   ENDOSCOPIST:  Georgiana Spinner, M.D.   INDICATIONS FOR PROCEDURE:  Abdominal pain, reflux symptomatology   ANESTHESIA:  Demerol 100 mg, Versed 10 mg.   DESCRIPTION OF PROCEDURE:  With the patient mildly sedated in the left  lateral decubitus position, the Olympus videoscopic endoscope was inserted  in the mouth and passed under direct vision through the esophagus which  appeared normal.  We entered into the stomach.  The fundus, body, antrum,  duodenal bulb and second portion of the duodenum were visualized.  From this  point, the endoscope was slowly withdrawn taking circumferential views of  the entire duodenal mucosa until the endoscope was pulled back into the  stomach and placed on retroflexion to view the stomach from below.  The  endoscope was then straightened and withdrawn taking circumferential views  of the remaining gastric and esophageal mucosa stopping in the body of the  stomach where erythema was seen, somewhat distinct from the antrum.  This  was photographed and biopsied.  The patient's vital signs and pulse oximeter  remained stable.  The patient tolerated the procedure well without apparent  complications.   FINDINGS:  Erythema of body of stomach biopsied, await biopsy report.  Patient will call me for results and follow up with me as an outpatient.       GMO/MEDQ  D:  06/24/2004  T:  06/24/2004  Job:  [id number]
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
## 2011-01-14 NOTE — H&P (Signed)
[name], [name]            ACCOUNT NO.:  [id number]   MEDICAL RECORD NO.:  [id number]          PATIENT TYPE:  IPS   LOCATION:  0303                          [facility]ACILITY:  [facility]   PHYSICIAN:  [name], M.D. DATE OF BIRTH:  1971-05-09   DATE OF ADMISSION:  01/30/2006  DATE OF DISCHARGE:                         PSYCHIATRIC ADMISSION ASSESSMENT   DATE OF ASSESSMENT:  January 31, 2006 at 2 p.m.   IDENTIFYING INFORMATION:  This is a 40 year old single white female.  This  is a voluntary admission.   HISTORY OF PRESENT ILLNESS:  [name] presents with one month of unstable  mood.  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  Having difficulty focusing, developing some paranoia, feeling that everyone  is looking at her.  She would have panic attacks at work, where she would  feel like she was closed into a tunnel, and her mind was so busy that she  was unable to hear what was going on around her.  Two weeks ago, she had  three days with no sleep, feeling manic and on a high, up doing housework at  night, unable to sleep, but more recently spending a lot of time in severe  depressive lows, getting more anxious, unable to leave the house.  Feels  that people are looking at her.  One week of suicidal thought, perseverating  on shooting herself with her father's gun, to which she has access.  She  said she would have to her father's house to get the gun and kill herself,  except she was also torn with feelings of paranoia and felt unable to get  out of the house and drive.  Her medications ran out approximately five days  ago, and she has felt unable to get out of the house to get any more.  She  is experiencing a lot of caregiver stress, caring for her father who is  terminally ill at home receiving hospice care, and her mother who has had a  third heart attack recently.  She denies any  homicidal thought or  hallucinations.   PAST PSYCHIATRIC HISTORY:  This is the second admission to [facility], with patient's prior admission being here in  August of 2006, at which time she had overdosed on multiple medications in  an intentional attempt to harm herself.  At that time, she was also not  sleeping well and having mood fluctuations.  Her first psychiatric admission  was at the age of 75 for an overdose, at which time she was admitted for  medical care.  She has a history of mood swings with a lot of impulsivity.  Reports being on highs for days at a time, and once at age 38, while she was  on a high, took off with her boyfriend to go to [place] for three weeks,  then got confused when she got there about how she had arrived.  She has a  history of impulsive actions and some intrusive  thoughts and possibly  guarding while she is having her mood swings.   SOCIAL HISTORY:  Single white female, never married, no children.  Currently  living with her boyfriend.  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  Parents live nearby, and she is the primary caregiver.  She does have a history of marijuana use.  No alcohol abuse.  Denies any  current substance abuse.   MEDICAL HISTORY:  The patient is followed by [facility],  specifically [name], her gastroenterologist.  Medical problems are  chronic GERD with erosive esophagitis by history.   PAST MEDICAL HISTORY:  Remarkable for history of a cancerous skin lesion.  No surgeries.  Medical hospitalizations for GI evaluation in the past and  acute episodic illnesses.   MEDICATIONS:  1.  Neurontin 300 mg four times daily.  The patient is not sure why she is      on this.  She believes she was originally given it for anxiety.  2.  Celexa 40 mg daily for one year.  3.  Risperdal 1 mg p.o. q.h.s.  4.  Nexium 40 mg b.i.d.  5.  Hyoscyamine 0.325 mg p.o.  b.i.d.  6.  She was previously on Klonopin 0.5 mg t.i.d. but had stopped that      several months ago.   DRUG ALLERGIES:  CODEINE which causes nausea, and EPINEPHRINE which caused  her to have some red streaks along her skin.   POSITIVE PHYSICAL FINDINGS:  Well-nourished, well-developed female in no  acute distress.   REVIEW OF SYSTEMS:  Remarkable for no sleep for three days, feeling that she  was on a high with a lot of energy two weeks ago.  Sex drive is decreased.  She is experiencing some breast tenderness with no galactorrhea.  Denies any  history of fever or chills.  Appetite is poor.  She has gained 30 pounds in  the past year.  Having no fever or chills.  She has chronic loose stools  which she attributes to her erosive esophagitis and is currently  experiencing some urinary hesitancy without frank dysuria.   PHYSICAL EXAMINATION:  VITAL SIGNS:  Temp is afebrile.  Pulse 64,  respirations 20, blood pressure 121/89.  She is 5 feet 4 inches tall, 161  pounds.  HEAD:  Normocephalic and atraumatic.  EENT:  Extraocular movements are intact.  PERRL.  Sclerae are nonicteric.  Oropharynx is not injected.  Dentition in satisfactory condition.  NECK:  Supple.  No thyromegaly or lymphadenopathy.  CHEST:  Clear to auscultation.  BREASTS:  Exam deferred.  CARDIOVASCULAR:  S1 and S2 are heard.  No clicks, murmurs, gallops, or extra  sounds.  Apical rate synchronous with radial rate.  ABDOMEN:  Soft, nontender, nondistended.  Normal bowel sounds.  GENITOURINARY:  Deferred.  EXTREMITIES:  Pink and warm.  No edema.  Peripheral pulses are 2+.  SKIN:  Clear.  No signs of rash.  NEUROLOGIC:  Cranial nerves II-XII are intact.  No signs of EPS.  Neuro is  nonfocal.  Cerebellar function is intact.  Romberg without findings.   DIAGNOSTIC STUDIES:  CBC:  WBC 11.4, hemoglobin 14.7, hematocrit 44.4,  platelets 458,000.  Electrolytes:  Sodium 134, potassium 3.7, chloride 101, carbon dioxide 25, BUN  4, creatinine 0.9, random glucose 105.  Liver  function:  SGOT 19, SGPT 12, alkaline phosphatase 57, and total bilirubin  0.6.  She has a TSH of 1.564.  Routine  UA, UPT, and UDS are currently  pending.   MENTAL STATUS EXAMINATION:  A fully alert female, somewhat overweight,  cooperative, with a blunted affect.  Some lability to her affect.  Speech is  normal in pace, tone, and production.  Some impulsivity.  Concentration is  decreased.  Mood:  Labile, depressed.  Thought process reveals a history of  clear suicidal thoughts with a plan to shoot herself, clearly having access  to her father's gun, knowing where it is stored and where the ammunition is.  She has thought about this quite a bit over the last week.  No homicidal  thought.  No psychosis.  History of paranoia is somewhat unclear.  It sound  like she has been getting somewhat paranoid along with the fluctuations in  her anxiety level and accompanied by insomnia.  She has tolerated groups  well today though, however.  Cognitively, she is intact and oriented times  three.   DIAGNOSES:  AXIS I:  Rule out bipolar 1 disorder, hypomanic versus mixed  state.  AXIS II:  Deferred.  AXIS III:  Gastroesophageal reflux disease, erosive esophagitis by history.  AXIS IV:  Severe stress with caregiver for parents' illness.  AXIS V:  Current 30; past year 69-75.   PLAN:  Voluntarily admit the patient with every-15-minute checks in place,  with a plan to alleviate her suicidal ideation and stabilize her mood.  We  have discussed the plan at length today with her, including the functions of  the various medications that we are going to be using.  At this point, we  are going to re-start her Klonopin at 0.5 mg p.o. t.i.d.  We will also  increase her Risperdal to help stabilize her, giving her 1.5 mg tonight at  h.s., 0.5 mg now and 0.5 mg q. 6 p.r.n. for agitation.  Then, we are also  going to start lithium 300 mg controlled release now, then  every morning and  at h.s.  We will check a prolactin level on her, since she is taking the  Risperdal and having breast tenderness, although her menses are regular.  We  will also check a lithium level and a BMET on June 8 in the morning.  We did  med education today, including the need to not get pregnant and consider a  form of formal contraception, since she is not currently using anything at  this time.  We are going to give her written information to review today on  the lithium and Risperdal, and she has voiced her agreement with the plan.   ESTIMATED LENGTH OF STAY:  Five to seven days.      [name], N.P.      [name], M.D.  Electronically Signed    MAS/MEDQ  D:  01/31/2006  T:  02/01/2006  Job:  [id number]

## 2011-03-09 ENCOUNTER — Other Ambulatory Visit: Payer: Self-pay | Admitting: Obstetrics

## 2011-03-10 ENCOUNTER — Other Ambulatory Visit: Payer: Self-pay | Admitting: Obstetrics

## 2011-03-10 DIAGNOSIS — E28319 Asymptomatic premature menopause: Secondary | ICD-10-CM

## 2011-03-17 ENCOUNTER — Ambulatory Visit (HOSPITAL_COMMUNITY): Admission: RE | Admit: 2011-03-17 | Payer: Medicare Other | Source: Ambulatory Visit

## 2011-06-10 LAB — COMPREHENSIVE METABOLIC PANEL
ALT: 20
ALT: 23
ALT: 25
AST: 16
AST: 19
AST: 19
Albumin: 2.9 — ABNORMAL LOW
Albumin: 3.1 — ABNORMAL LOW
Alkaline Phosphatase: 57
CO2: 22
CO2: 23
CO2: 25
Calcium: 8.4
Calcium: 8.5
Chloride: 109
Chloride: 110
Creatinine, Ser: 0.75
Creatinine, Ser: 0.85
GFR calc Af Amer: >60
GFR calc Af Amer: >60
GFR calc Af Amer: >60
GFR calc non Af Amer: >60
GFR calc non Af Amer: >60
Glucose, Bld: 110 — ABNORMAL HIGH
Potassium: 3.7
Sodium: 136
Sodium: 136
Sodium: 140
Total Bilirubin: 0.6
Total Protein: 6.2

## 2011-06-10 LAB — CBC
MCHC: 33.8
MCV: 93.8
Platelets: 370
Platelets: 388
RBC: 4.15
RBC: 4.45
RDW: 14.1 — ABNORMAL HIGH
WBC: 9.2

## 2011-06-10 LAB — LITHIUM LEVEL
Lithium Lvl: 0.84
Lithium Lvl: 1.21

## 2011-06-10 LAB — DIFFERENTIAL
Basophils Relative: 1
Eosinophils Absolute: 0.3
Eosinophils Relative: 2
Eosinophils Relative: 3
Lymphocytes Relative: 18
Lymphocytes Relative: 19
Lymphs Abs: 1.7
Lymphs Abs: 1.8
Monocytes Absolute: 0.4
Monocytes Relative: 5
Neutro Abs: 6.9

## 2011-06-10 LAB — PREGNANCY, URINE: Preg Test, Ur: NEGATIVE

## 2011-06-10 LAB — URINALYSIS, ROUTINE W REFLEX MICROSCOPIC
Bilirubin Urine: NEGATIVE
Hgb urine dipstick: NEGATIVE
Ketones, ur: NEGATIVE
Nitrite: NEGATIVE
pH: 6.5

## 2011-06-10 LAB — RAPID URINE DRUG SCREEN, HOSP PERFORMED
Amphetamines: NOT DETECTED
Cocaine: NOT DETECTED
Tetrahydrocannabinol: POSITIVE — AB

## 2011-06-10 LAB — ACETAMINOPHEN LEVEL: Acetaminophen (Tylenol), Serum: <10 — ABNORMAL LOW

## 2011-06-10 LAB — SALICYLATE LEVEL: Salicylate Lvl: <4

## 2012-06-04 ENCOUNTER — Other Ambulatory Visit: Payer: Self-pay | Admitting: Obstetrics & Gynecology

## 2012-06-04 DIAGNOSIS — N644 Mastodynia: Secondary | ICD-10-CM

## 2012-06-07 ENCOUNTER — Other Ambulatory Visit: Payer: Self-pay | Admitting: Obstetrics & Gynecology

## 2012-06-08 ENCOUNTER — Encounter (HOSPITAL_COMMUNITY): Payer: Self-pay | Admitting: *Deleted

## 2012-06-09 ENCOUNTER — Encounter (HOSPITAL_COMMUNITY): Payer: Self-pay | Admitting: Pharmacist

## 2012-06-22 ENCOUNTER — Encounter (HOSPITAL_COMMUNITY): Payer: Self-pay | Admitting: Obstetrics & Gynecology

## 2012-06-22 ENCOUNTER — Ambulatory Visit (HOSPITAL_COMMUNITY): Payer: Medicare Other | Admitting: Anesthesiology

## 2012-06-22 ENCOUNTER — Encounter (HOSPITAL_COMMUNITY): Payer: Self-pay | Admitting: Anesthesiology

## 2012-06-22 ENCOUNTER — Encounter (HOSPITAL_COMMUNITY)
Admission: RE | Disposition: A | Discharge status: Home / Self Care | Payer: Self-pay | Source: Ambulatory Visit | Attending: Obstetrics & Gynecology

## 2012-06-22 ENCOUNTER — Ambulatory Visit (HOSPITAL_COMMUNITY)
Admission: RE | Admit: 2012-06-22 | Discharge: 2012-06-22 | Disposition: A | Discharge status: Home / Self Care | Payer: Medicare Other | Source: Ambulatory Visit | Attending: Obstetrics & Gynecology | Admitting: Obstetrics & Gynecology

## 2012-06-22 ENCOUNTER — Encounter (HOSPITAL_COMMUNITY): Payer: Self-pay | Admitting: *Deleted

## 2012-06-22 DIAGNOSIS — N926 Irregular menstruation, unspecified: Secondary | ICD-10-CM | POA: Diagnosis present

## 2012-06-22 DIAGNOSIS — N938 Other specified abnormal uterine and vaginal bleeding: Secondary | ICD-10-CM | POA: Insufficient documentation

## 2012-06-22 DIAGNOSIS — N949 Unspecified condition associated with female genital organs and menstrual cycle: Secondary | ICD-10-CM | POA: Insufficient documentation

## 2012-06-22 HISTORY — DX: Gastro-esophageal reflux disease without esophagitis: K21.9

## 2012-06-22 HISTORY — PX: HYSTEROSCOPY W/D&C: SHX1775

## 2012-06-22 HISTORY — DX: Irritable bowel syndrome, unspecified: K58.9

## 2012-06-22 LAB — CBC
Hemoglobin: 15 g/dL (ref 12.0–15.0)
MCH: 31.6 pg (ref 26.0–34.0)
MCHC: 33.2 g/dL (ref 30.0–36.0)
Platelets: 333 10*3/uL (ref 150–400)
RDW: 12.5 % (ref 11.5–15.5)

## 2012-06-22 LAB — PREGNANCY, URINE: Preg Test, Ur: NEGATIVE

## 2012-06-22 SURGERY — DILATATION AND CURETTAGE /HYSTEROSCOPY
Anesthesia: General | Site: Uterus | Wound class: Clean Contaminated

## 2012-06-22 MED ORDER — LACTATED RINGERS IV SOLN
INTRAVENOUS | Status: DC
  Administered 2012-06-22: 15:00:00 via INTRAVENOUS
  Administered 2012-06-22: 125 mL/h via INTRAVENOUS

## 2012-06-22 MED ORDER — FENTANYL CITRATE 0.05 MG/ML IJ SOLN
INTRAMUSCULAR | Status: DC | PRN
  Administered 2012-06-22 (×2): 50 ug via INTRAVENOUS
  Administered 2012-06-22 (×2): 25 ug via INTRAVENOUS
  Administered 2012-06-22: 50 ug via INTRAVENOUS

## 2012-06-22 MED ORDER — GLYCINE 1.5 % IR SOLN
Status: DC | PRN
  Administered 2012-06-22: 1

## 2012-06-22 MED ORDER — FENTANYL CITRATE 0.05 MG/ML IJ SOLN
INTRAMUSCULAR | Status: AC
  Filled 2012-06-22: qty 2

## 2012-06-22 MED ORDER — LIDOCAINE HCL (CARDIAC) 20 MG/ML IV SOLN
INTRAVENOUS | Status: DC | PRN
  Administered 2012-06-22 (×2): 20 mg via INTRAVENOUS

## 2012-06-22 MED ORDER — SODIUM CHLORIDE 0.9 % IV SOLN: 250.0000 mL | INTRAVENOUS | Status: DC | PRN

## 2012-06-22 MED ORDER — ACETAMINOPHEN 325 MG PO TABS: 650.0000 mg | ORAL_TABLET | ORAL | Status: DC | PRN

## 2012-06-22 MED ORDER — ONDANSETRON HCL 4 MG/2ML IJ SOLN
INTRAMUSCULAR | Status: AC
  Filled 2012-06-22: qty 2

## 2012-06-22 MED ORDER — ONDANSETRON HCL 4 MG/2ML IJ SOLN: 4.0000 mg | Freq: Four times a day (QID) | INTRAMUSCULAR | Status: DC | PRN

## 2012-06-22 MED ORDER — PROPOFOL 10 MG/ML IV EMUL
INTRAVENOUS | Status: DC | PRN
  Administered 2012-06-22: 150 mg via INTRAVENOUS

## 2012-06-22 MED ORDER — MIDAZOLAM HCL 5 MG/5ML IJ SOLN
INTRAMUSCULAR | Status: DC | PRN
  Administered 2012-06-22: 2 mg via INTRAVENOUS

## 2012-06-22 MED ORDER — OXYCODONE-ACETAMINOPHEN 5-325 MG PO TABS: 2.0000 | ORAL_TABLET | Freq: Four times a day (QID) | ORAL | Status: DC | PRN

## 2012-06-22 MED ORDER — ACETAMINOPHEN 650 MG RE SUPP
650.0000 mg | RECTAL | Status: DC | PRN
  Filled 2012-06-22: qty 1

## 2012-06-22 MED ORDER — HYDROMORPHONE HCL PF 1 MG/ML IJ SOLN
INTRAMUSCULAR | Status: DC | PRN
  Administered 2012-06-22: 1 mg via INTRAVENOUS

## 2012-06-22 MED ORDER — SODIUM CHLORIDE 0.9 % IJ SOLN: 3.0000 mL | Freq: Two times a day (BID) | INTRAMUSCULAR | Status: DC

## 2012-06-22 MED ORDER — FENTANYL CITRATE 0.05 MG/ML IJ SOLN
INTRAMUSCULAR | Status: AC
  Administered 2012-06-22: 50 ug via INTRAVENOUS
  Filled 2012-06-22: qty 2

## 2012-06-22 MED ORDER — MEPERIDINE HCL 25 MG/ML IJ SOLN: 6.2500 mg | INTRAMUSCULAR | Status: DC | PRN

## 2012-06-22 MED ORDER — LIDOCAINE HCL (CARDIAC) 20 MG/ML IV SOLN
INTRAVENOUS | Status: AC
  Filled 2012-06-22: qty 5

## 2012-06-22 MED ORDER — FENTANYL CITRATE 0.05 MG/ML IJ SOLN
25.0000 ug | INTRAMUSCULAR | Status: DC | PRN
  Administered 2012-06-22: 50 ug via INTRAVENOUS

## 2012-06-22 MED ORDER — MIDAZOLAM HCL 2 MG/2ML IJ SOLN
INTRAMUSCULAR | Status: AC
  Filled 2012-06-22: qty 2

## 2012-06-22 MED ORDER — ONDANSETRON HCL 4 MG/2ML IJ SOLN
INTRAMUSCULAR | Status: DC | PRN
  Administered 2012-06-22: 4 mg via INTRAVENOUS

## 2012-06-22 MED ORDER — PROPOFOL 10 MG/ML IV EMUL
INTRAVENOUS | Status: AC
  Filled 2012-06-22: qty 20

## 2012-06-22 MED ORDER — KETOROLAC TROMETHAMINE 30 MG/ML IJ SOLN
INTRAMUSCULAR | Status: AC
  Filled 2012-06-22: qty 1

## 2012-06-22 MED ORDER — KETOROLAC TROMETHAMINE 30 MG/ML IJ SOLN
INTRAMUSCULAR | Status: DC | PRN
  Administered 2012-06-22: 30 mg via INTRAVENOUS

## 2012-06-22 MED ORDER — OXYCODONE HCL 5 MG PO TABS: 5.0000 mg | ORAL_TABLET | ORAL | Status: DC | PRN

## 2012-06-22 MED ORDER — SODIUM CHLORIDE 0.9 % IJ SOLN: 3.0000 mL | INTRAMUSCULAR | Status: DC | PRN

## 2012-06-22 MED ORDER — LIDOCAINE HCL 1 % IJ SOLN
INTRAMUSCULAR | Status: DC | PRN
  Administered 2012-06-22: 10 mL

## 2012-06-22 MED ORDER — HYDROMORPHONE HCL PF 1 MG/ML IJ SOLN
INTRAMUSCULAR | Status: AC
  Filled 2012-06-22: qty 1

## 2012-06-22 MED ORDER — METOCLOPRAMIDE HCL 5 MG/ML IJ SOLN: 10.0000 mg | Freq: Once | INTRAMUSCULAR | Status: DC | PRN

## 2012-06-22 SURGICAL SUPPLY — 11 items
CANISTER SUCTION 2500CC (MISCELLANEOUS) ×2 IMPLANT
CATH ROBINSON RED A/P 16FR (CATHETERS) ×2 IMPLANT
CLOTH BEACON ORANGE TIMEOUT ST (SAFETY) ×2 IMPLANT
CONTAINER PREFILL 10% NBF 60ML (FORM) ×2 IMPLANT
DRESSING TELFA 8X3 (GAUZE/BANDAGES/DRESSINGS) ×2 IMPLANT
GLOVE BIO SURGEON STRL SZ 6.5 (GLOVE) ×4 IMPLANT
GOWN STRL REIN XL XLG (GOWN DISPOSABLE) ×4 IMPLANT
PACK HYSTEROSCOPY LF (CUSTOM PROCEDURE TRAY) ×2 IMPLANT
PAD OB MATERNITY 4.3X12.25 (PERSONAL CARE ITEMS) ×2 IMPLANT
TOWEL OR 17X24 6PK STRL BLUE (TOWEL DISPOSABLE) ×4 IMPLANT
WATER STERILE IRR 1000ML POUR (IV SOLUTION) ×2 IMPLANT

## 2012-06-22 NOTE — Transfer of Care (Signed)
Immediate Anesthesia Transfer of Care Note  Patient: [name]  Procedure(s) Performed: Procedure(s) (LRB) with comments: DILATATION AND CURETTAGE /HYSTEROSCOPY (N/A)  Patient Location: PACU  Anesthesia Type: General  Level of Consciousness: sedated  Airway & Oxygen Therapy: Patient Spontanous Breathing and Patient connected to nasal cannula oxygen  Post-op Assessment: Report given to PACU RN  Post vital signs: Reviewed and stable  Complications: No apparent anesthesia complications

## 2012-06-22 NOTE — H&P (Signed)
  Chief Complaint: 41 y.o. who presents with AUB  Details of Present Illness: Amenorrhea x 7 months.  Now with irregular cycles for several months.  No associated symptoms.  Ht 5' (1.524 m)  Wt 80.74 kg (178 lb)  BMI 34.76 kg/m2  Past Medical History  Diagnosis Date  . GERD (gastroesophageal reflux disease)    History   Social History  . Marital Status: Single    Spouse Name: N/A    Number of Children: N/A  . Years of Education: N/A   Occupational History  . Not on file.   Social History Main Topics  . Smoking status: Current Every Day Smoker -- 1.0 packs/day  . Smokeless tobacco: Never Used  . Alcohol Use: No  . Drug Use: No  . Sexually Active: Yes   Other Topics Concern  . Not on file   Social History Narrative  . No narrative on file   History reviewed. No pertinent family history.  Pertinent items are noted in HPI.  Pre-Op Diagnosis: AUB   Planned Procedure: Procedure(s): DILATATION AND CURETTAGE /HYSTEROSCOPY  I have reviewed the patient's history and have completed the physical exam and [name] is acceptable for surgery.  Roseanna Rainbow, MD 06/22/2012 7:36 AM

## 2012-06-22 NOTE — Op Note (Signed)
Preoperative diagnosis: Abnormal uterine bleeding  Postoperative diagnosis: same  Procedure: Diagnostic hysteroscopy, dilatation and curettage  Surgeon: Antionette Char A  Anesthesia: Managed anesthesia care, paracervical block  Estimated blood loss: minimal  Urine output: 100 ml   IV Fluids:  Per Anesthesiology  Complications: none  Specimen: PATHOLOGY  Operative Findings: Polypoid appearing endometrium--right side wall, posterior wall.  Description of procedure:   The patient was taken to the operating room and placed on the operating table in the semi-lithotomy position in [place] stirrups.  Examination under anesthesia was performed.  The patient was prepped and draped in the usual manner.  After a time-out had been completed, a speculum was placed in the vagina.  The anterior lip of the cervix was grasped with a single-toothed tenaculum.    10 cc of 1% lidocaine were injected at 4 and 7 o'clock to produce a paracervical block.  The uterine cavity sounded to 7 cm.  The endocervical canal was dilated with Shawnie Pons dilators.  A 5 mm diagnostic hysteroscope with Glycine as the distending medium was used to perform a diagnostic hysteroscopy.  The findings are noted above.    The hysteroscope was removed.  A small, Sims curette was used to perform an endometrial curettage.  All the instruments were removed from the vagina.  Final instrument counts were correct.  The patient was taken to the PACU in stable condition.

## 2012-06-22 NOTE — Anesthesia Postprocedure Evaluation (Signed)
  Anesthesia Post-op Note  Patient: [name]  Procedure(s) Performed: Procedure(s) (LRB) with comments: DILATATION AND CURETTAGE /HYSTEROSCOPY (N/A)  Patient Location: PACU  Anesthesia Type: General  Level of Consciousness: awake, alert  and oriented  Airway and Oxygen Therapy: Patient Spontanous Breathing  Post-op Pain: mild  Post-op Assessment: Post-op Vital signs reviewed, Patient's Cardiovascular Status Stable, Respiratory Function Stable, Patent Airway, No signs of Nausea or vomiting and Pain level controlled  Post-op Vital Signs: Reviewed and stable  Complications: No apparent anesthesia complications

## 2012-06-22 NOTE — Anesthesia Preprocedure Evaluation (Signed)
Anesthesia Evaluation  Patient identified by MRN, date of birth, ID band Patient awake    Reviewed: Allergy & Precautions, H&P , NPO status , Patient's Chart, lab work & pertinent test results  Airway Mallampati: III TM Distance: >3 FB Neck ROM: Full    Dental No notable dental hx. (+) Teeth Intact   Pulmonary neg pulmonary ROS,  breath sounds clear to auscultation  Pulmonary exam normal       Cardiovascular negative cardio ROS  Rhythm:Regular Rate:Tachycardia     Neuro/Psych PSYCHIATRIC DISORDERS Bipolar Disorder negative neurological ROS     GI/Hepatic Neg liver ROS, GERD-  Medicated and Controlled,  Endo/Other  Hypercholesterolemia  Renal/GU negative Renal ROS  negative genitourinary   Musculoskeletal negative musculoskeletal ROS (+)   Abdominal (+) + obese,   Peds  Hematology Hx/o Thrombocytosis   Anesthesia Other Findings   Reproductive/Obstetrics AUB                           Anesthesia Physical Anesthesia Plan  ASA: II  Anesthesia Plan: General   Post-op Pain Management:    Induction: Intravenous  Airway Management Planned: LMA  Additional Equipment:   Intra-op Plan:   Post-operative Plan: Extubation in OR  Informed Consent: I have reviewed the patients History and Physical, chart, labs and discussed the procedure including the risks, benefits and alternatives for the proposed anesthesia with the patient or authorized representative who has indicated his/her understanding and acceptance.   Dental advisory given  Plan Discussed with: Anesthesiologist, CRNA and Surgeon  Anesthesia Plan Comments:         Anesthesia Quick Evaluation

## 2012-06-25 ENCOUNTER — Encounter (HOSPITAL_COMMUNITY): Payer: Self-pay | Admitting: Obstetrics & Gynecology

## 2012-07-20 ENCOUNTER — Other Ambulatory Visit: Payer: Self-pay | Admitting: Family Medicine

## 2012-07-20 ENCOUNTER — Ambulatory Visit
Admission: RE | Admit: 2012-07-20 | Discharge: 2012-07-20 | Disposition: A | Discharge status: Home / Self Care | Payer: Medicare Other | Source: Ambulatory Visit | Attending: Family Medicine | Admitting: Family Medicine

## 2012-07-20 DIAGNOSIS — R110 Nausea: Secondary | ICD-10-CM

## 2012-07-20 DIAGNOSIS — R1011 Right upper quadrant pain: Secondary | ICD-10-CM

## 2012-07-25 ENCOUNTER — Encounter (INDEPENDENT_AMBULATORY_CARE_PROVIDER_SITE_OTHER): Payer: Self-pay | Admitting: General Surgery

## 2012-07-30 ENCOUNTER — Ambulatory Visit (INDEPENDENT_AMBULATORY_CARE_PROVIDER_SITE_OTHER): Payer: Medicaid Other | Admitting: General Surgery

## 2012-07-30 ENCOUNTER — Telehealth (INDEPENDENT_AMBULATORY_CARE_PROVIDER_SITE_OTHER): Payer: Self-pay | Admitting: General Surgery

## 2012-07-30 ENCOUNTER — Encounter (INDEPENDENT_AMBULATORY_CARE_PROVIDER_SITE_OTHER): Payer: Self-pay | Admitting: General Surgery

## 2012-07-30 VITALS — BP 122/84 | HR 80 | Temp 99.2°F | Resp 18 | Ht 60.0 in | Wt 186.8 lb

## 2012-07-30 DIAGNOSIS — K8020 Calculus of gallbladder without cholecystitis without obstruction: Secondary | ICD-10-CM

## 2012-07-30 LAB — HEPATIC FUNCTION PANEL
ALT: 16 U/L (ref 0–35)
AST: 14 U/L (ref 0–37)
Albumin: 4.2 g/dL (ref 3.5–5.2)
Alkaline Phosphatase: 67 U/L (ref 39–117)
Total Bilirubin: 0.2 mg/dL — ABNORMAL LOW (ref 0.3–1.2)

## 2012-07-30 LAB — CBC
HCT: 45.4 % (ref 36.0–46.0)
Hemoglobin: 15.6 g/dL — ABNORMAL HIGH (ref 12.0–15.0)
MCH: 31.6 pg (ref 26.0–34.0)
MCHC: 34.4 g/dL (ref 30.0–36.0)
RBC: 4.93 MIL/uL (ref 3.87–5.11)

## 2012-07-30 LAB — LIPASE: Lipase: 11 U/L (ref 0–75)

## 2012-07-30 MED ORDER — PROMETHAZINE HCL 12.5 MG PO TABS: 12.5000 mg | ORAL_TABLET | Freq: Four times a day (QID) | ORAL | Status: DC | PRN

## 2012-07-30 NOTE — Telephone Encounter (Signed)
LMOM letting pt know her first PO appt will be on 12/20 at 11:40

## 2012-07-30 NOTE — Progress Notes (Signed)
[name], female   DOB: 09/01/1970, 41 y.o.   MRN: [id number]  Chief Complaint  Patient presents with  . Cholelithiasis    new pt- eval GB w/ stones    HPI [name] is a 41 y.o. female.  Referred by Dr. Elkins HPI This is a 41-year-old female who presents with about a one-year history of right upper quadrant pain that radiates to her back, nausea, heartburn, some emesis. This is associated with some watery stools. This is made worse when she fried food. The heartburn is a somewhat with Prilosec. This has gotten more frequent over the last year and now is occurring 2 times a week. The pain is the major issue for her at this point. She has undergone ultrasound in November of that shows a 1 cm gallstone with no pericholecystic fluid or any gallbladder wall thickening present. She comes in today she is currently having an attack. Past Medical History  Diagnosis Date  . GERD (gastroesophageal reflux disease)   . IBS (irritable bowel syndrome)     Past Surgical History  Procedure Date  . Knee arthroscopy 2011    left  . Wisdom tooth extraction   . Hysteroscopy w/d&c 06/22/2012    Procedure: DILATATION AND CURETTAGE /HYSTEROSCOPY;  Surgeon: Lisa Jackson-Moore, MD;  Location: [facility];  Service: Gynecology;  Laterality: N/A;    Family History  Problem Relation Age of Onset  . Heart disease Father   . Stroke Father   . Cancer Maternal Grandfather     colon  . Cancer Paternal Grandmother     stomach    Social History History  Substance Use Topics  . Smoking status: Current Every Day Smoker -- 1.0 packs/day for 25 years    Types: Cigarettes  . Smokeless tobacco: Never Used  . Alcohol Use: Yes     Comment: socially    Allergies  Allergen Reactions  . Codeine Nausea And Vomiting    Pt can take Vicodin if given with promethazine    Current Outpatient Prescriptions  Medication Sig Dispense Refill  . dicyclomine (BENTYL) 20 MG tablet Take 20 mg by mouth  4 (four) times daily as needed. Takes prior to meals and at bedtime as needed for upset stomach      . ibuprofen (ADVIL,MOTRIN) 200 MG tablet Take 400 mg by mouth 3 (three) times daily as needed. For pain      . omeprazole (PRILOSEC) 20 MG capsule Take 40 mg by mouth daily.        Review of Systems Review of Systems  Constitutional: Negative for fever, chills and unexpected weight change.  HENT: Negative for hearing loss, congestion, sore throat, trouble swallowing and voice change.   Eyes: Negative for visual disturbance.  Respiratory: Negative for cough and wheezing.   Cardiovascular: Positive for chest pain. Negative for palpitations and leg swelling.  Gastrointestinal: Positive for nausea, abdominal pain and diarrhea. Negative for vomiting, constipation, blood in stool, abdominal distention and anal bleeding.  Genitourinary: Negative for hematuria, vaginal bleeding and difficulty urinating.  Musculoskeletal: Negative for arthralgias.  Skin: Negative for rash and wound.  Neurological: Positive for weakness and headaches. Negative for seizures and syncope.  Hematological: Negative for adenopathy. Does not bruise/bleed easily.  Psychiatric/Behavioral: Negative for confusion.    Blood pressure 122/84, pulse 80, temperature 99.2 F (37.3 C), temperature source Temporal, resp. rate 18, height 5' (1.524 m), weight 186 lb 12.8 oz (84.732 kg).  Physical Exam Physical Exam  Vitals reviewed.   Constitutional: She appears well-developed and well-nourished.  Eyes: No scleral icterus.  Neck: Neck supple.  Cardiovascular: Normal rate, regular rhythm and normal heart sounds.   Pulmonary/Chest: Effort normal and breath sounds normal. She has no wheezes. She has no rales.  Abdominal: Soft. Normal appearance and bowel sounds are normal. She exhibits no distension. There is tenderness. There is negative Murphy's sign. No hernia.  Lymphadenopathy:    She has no cervical adenopathy.    Data  Reviewed LIMITED ABDOMINAL ULTRASOUND - RIGHT UPPER QUADRANT  Comparison: CT abdomen and pelvis 03/26/2009.  Findings:  Gallbladder: A 1.0 cm stone is identified within the gallbladder.  There is no pericholecystic fluid or gallbladder wall thickening.  Common bile duct: Measures 0.4 cm.  Liver: Demonstrates normal echotexture. No intrahepatic biliary  ductal dilatation or focal lesion.  IMPRESSION:  Single 1 cm gallstone without evidence of cholecystitis.   Assessment    Symptomatic cholelithiasis    Plan    Laparoscopic cholecystectomy, cholangiogram  She clearly is having symptoms related to her gallbladder. I did tell her that some of the symptoms that she hasn't may not get better with cholecystectomy. In fact some of them including her diarrhea may actually get worse. I'm going to check her liver function tests today as I cannot find any evidence of disease. She does give a history of having some acholic stools. Because of this I will follow up on her labs. Her labs show any abnormalities I will call her in discussed putting her in the hospital now. Otherwise on the schedule her as soon as possible for laparoscopic cholecystectomy for symptomatic cholelithiasis.  I discussed the procedure in detail. We discussed the risks and benefits of a laparoscopic cholecystectomy and possible cholangiogram including, but not limited to bleeding, infection, injury to surrounding structures such as the intestine or liver, bile leak, retained gallstones, need to convert to an open procedure, prolonged diarrhea, blood clots such as  DVT, common bile duct injury, anesthesia risks, and possible need for additional procedures.  The likelihood of improvement in symptoms and return to the patient's normal status is good. We discussed the typical post-operative recovery course.        Kerryn Tennant 07/30/2012, 12:07 PM    

## 2012-07-30 NOTE — Addendum Note (Signed)
Addended byEmelia Loron on: 07/30/2012 01:09 PM   Modules accepted: Orders

## 2012-07-31 ENCOUNTER — Encounter (HOSPITAL_COMMUNITY)
Admission: RE | Admit: 2012-07-31 | Discharge: 2012-07-31 | Disposition: A | Discharge status: Home / Self Care | Payer: Medicare Other | Source: Ambulatory Visit | Attending: General Surgery | Admitting: General Surgery

## 2012-07-31 ENCOUNTER — Telehealth (INDEPENDENT_AMBULATORY_CARE_PROVIDER_SITE_OTHER): Payer: Self-pay | Admitting: General Surgery

## 2012-07-31 ENCOUNTER — Encounter (HOSPITAL_COMMUNITY): Payer: Self-pay

## 2012-07-31 ENCOUNTER — Encounter (HOSPITAL_COMMUNITY): Payer: Self-pay | Admitting: Pharmacy Technician

## 2012-07-31 ENCOUNTER — Ambulatory Visit (HOSPITAL_COMMUNITY)
Admission: RE | Admit: 2012-07-31 | Discharge: 2012-07-31 | Disposition: A | Discharge status: Home / Self Care | Payer: Medicare Other | Source: Ambulatory Visit | Attending: General Surgery | Admitting: General Surgery

## 2012-07-31 DIAGNOSIS — R059 Cough, unspecified: Secondary | ICD-10-CM | POA: Insufficient documentation

## 2012-07-31 DIAGNOSIS — R05 Cough: Secondary | ICD-10-CM | POA: Insufficient documentation

## 2012-07-31 HISTORY — DX: Anxiety disorder, unspecified: F41.9

## 2012-07-31 HISTORY — DX: Major depressive disorder, single episode, unspecified: F32.9

## 2012-07-31 HISTORY — DX: Depression, unspecified: F32.A

## 2012-07-31 LAB — HCG, SERUM, QUALITATIVE: Preg, Serum: NEGATIVE

## 2012-07-31 LAB — COMPREHENSIVE METABOLIC PANEL
BUN: 8 mg/dL (ref 6–23)
CO2: 27 mEq/L (ref 19–32)
Calcium: 9.1 mg/dL (ref 8.4–10.5)
Chloride: 103 mEq/L (ref 96–112)
Creatinine, Ser: 0.74 mg/dL (ref 0.50–1.10)
GFR calc Af Amer: >90 mL/min (ref >90)
GFR calc non Af Amer: >90 mL/min (ref >90)
Total Bilirubin: 0.1 mg/dL — ABNORMAL LOW (ref 0.3–1.2)

## 2012-07-31 LAB — CBC WITH DIFFERENTIAL/PLATELET
Eosinophils Relative: 2 % (ref 0–5)
HCT: 47.9 % — ABNORMAL HIGH (ref 36.0–46.0)
Hemoglobin: 16.5 g/dL — ABNORMAL HIGH (ref 12.0–15.0)
Lymphocytes Relative: 17 % (ref 12–46)
MCHC: 34.4 g/dL (ref 30.0–36.0)
MCV: 95.4 fL (ref 78.0–100.0)
Monocytes Absolute: 0.5 10*3/uL (ref 0.1–1.0)
Monocytes Relative: 5 % (ref 3–12)
Neutro Abs: 6.7 10*3/uL (ref 1.7–7.7)
RDW: 12.4 % (ref 11.5–15.5)
WBC: 8.8 10*3/uL (ref 4.0–10.5)

## 2012-07-31 NOTE — Pre-Procedure Instructions (Signed)
[address] Odis [address] Mione  07/31/2012   Your procedure is scheduled on:  Thursday August 02, 2012 at 1500 PM  Report to [facility] at 1300 PM.  Call this number if you have problems the morning of surgery: [phone]   Remember:   Do not eat food or drink:After Midnight.Wednesday      Take these medicines the morning of surgery with A SIP OF WATER: Prilosec, and Phenergan if needed   Do not wear jewelry, make-up or nail polish.  Do not wear lotions, powders, or perfumes. You may wear deodorant.  Do not shave 48 hours prior to surgery.   Do not bring valuables to the hospital.  Contacts, dentures or bridgework may not be worn into surgery.  Leave suitcase in the car. After surgery it may be brought to your room.  For patients admitted to the hospital, checkout time is 11:00 AM the day of discharge.   Patients discharged the day of surgery will not be allowed to drive home.    Special Instructions: Shower using CHG 2 nights before surgery and the night before surgery.  If you shower the day of surgery use CHG.  Use special wash - you have one bottle of CHG for all showers.  You should use approximately 1/3 of the bottle for each shower.   Please read over the following fact sheets that you were given: Pain Booklet, Coughing and Deep Breathing, MRSA Information and Surgical Site Infection Prevention

## 2012-07-31 NOTE — Telephone Encounter (Signed)
LMOM asking pt to return my call.  This is so that I may inform her that her lab work came back normal.

## 2012-07-31 NOTE — Telephone Encounter (Signed)
Message copied by Littie Deeds on Tue Jul 31, 2012  9:32 AM ------      Message from: Dwain Sarna, MATTHEW      Created: Mon Jul 30, 2012  9:19 PM       Her labs are all fine      ----- Message -----         From: [facility] Interface         Sent: 07/30/2012   7:41 PM           To: Emelia Loron, MD

## 2012-07-31 NOTE — Telephone Encounter (Signed)
Pt returned call and informed of normal labs.

## 2012-08-01 MED ORDER — DEXTROSE 5 % IV SOLN
2.0000 g | INTRAVENOUS | Status: AC
  Administered 2012-08-02: 2 g via INTRAVENOUS
  Filled 2012-08-01: qty 2

## 2012-08-01 NOTE — Consult Note (Signed)
Anesthesia Chart Review:  Patient is a 41 year old female scheduled for laparoscopic cholecystectomy on 08/02/12 by Dr. Dwain Sarna.  History includes smoking, obesity, GERD, IBS, anxiety, depression.  She is s/p D&C for abnormal uterine bleeding on 06/22/12.  She was evaluated by Cardiologist Dr. Antoine Poche in early 2011 for syncope and had a normal stress and echo.  Preoperative labs noted.  CXR on 07/31/12 showed no edema or consolidation.  Lungs clear.  EKG on 07/31/12 showed unusual P axis, possible ectopic atrial rhythm, cannot rule out anterior infarct (age undetermined). There was slight evidence of p wave abnormality in her inferior leads on her EKG from 10/16/09, but is more prominent and more extensive on her current EKG.  Echo on 10/28/09 showed: - Left ventricle: The cavity size was normal. Systolic function was normal. The estimated ejection fraction was in the range of 55% to 60%. Wall motion was normal; there were no regional wall motion abnormalities. - Aortic valve: No stenosis.  No regurgitation.  Valve area: 3.57cm^2(VTI). Valve area: 3.44cm^2 (Vmax).  She had a normal nuclear stress test on 10/28/09, EF 70%.  I reviewed her history and EKGs with Anesthesiologist Dr. Krista Blue.  If no significant change in her status then anticipate she can proceed from an anesthesia standpoint.  Shonna Chock, PA-C

## 2012-08-02 ENCOUNTER — Ambulatory Visit (HOSPITAL_COMMUNITY)
Admission: RE | Admit: 2012-08-02 | Discharge: 2012-08-02 | Disposition: A | Discharge status: Home / Self Care | Payer: Medicare Other | Source: Ambulatory Visit | Attending: General Surgery | Admitting: General Surgery

## 2012-08-02 ENCOUNTER — Encounter (HOSPITAL_COMMUNITY): Payer: Self-pay | Admitting: Vascular Surgery

## 2012-08-02 ENCOUNTER — Ambulatory Visit (HOSPITAL_COMMUNITY): Payer: Medicare Other | Admitting: Vascular Surgery

## 2012-08-02 ENCOUNTER — Encounter (HOSPITAL_COMMUNITY)
Admission: RE | Disposition: A | Discharge status: Home / Self Care | Payer: Self-pay | Source: Ambulatory Visit | Attending: General Surgery

## 2012-08-02 ENCOUNTER — Encounter (HOSPITAL_COMMUNITY): Payer: Self-pay | Admitting: Surgery

## 2012-08-02 DIAGNOSIS — K8020 Calculus of gallbladder without cholecystitis without obstruction: Secondary | ICD-10-CM | POA: Insufficient documentation

## 2012-08-02 DIAGNOSIS — K8010 Calculus of gallbladder with chronic cholecystitis without obstruction: Secondary | ICD-10-CM

## 2012-08-02 HISTORY — PX: CHOLECYSTECTOMY: SHX55

## 2012-08-02 SURGERY — LAPAROSCOPIC CHOLECYSTECTOMY WITH INTRAOPERATIVE CHOLANGIOGRAM
Anesthesia: General | Site: Abdomen | Wound class: Contaminated

## 2012-08-02 MED ORDER — ARTIFICIAL TEARS OP OINT
TOPICAL_OINTMENT | OPHTHALMIC | Status: DC | PRN
  Administered 2012-08-02: 1 via OPHTHALMIC

## 2012-08-02 MED ORDER — SODIUM CHLORIDE 0.9 % IV SOLN
INTRAVENOUS | Status: DC | PRN
  Administered 2012-08-02: 15:00:00

## 2012-08-02 MED ORDER — GLYCOPYRROLATE 0.2 MG/ML IJ SOLN
INTRAMUSCULAR | Status: DC | PRN
  Administered 2012-08-02: 0.6 mg via INTRAVENOUS

## 2012-08-02 MED ORDER — BUPIVACAINE-EPINEPHRINE PF 0.25-1:200000 % IJ SOLN
INTRAMUSCULAR | Status: AC
  Filled 2012-08-02: qty 30

## 2012-08-02 MED ORDER — OXYCODONE HCL 5 MG PO TABS
5.0000 mg | ORAL_TABLET | Freq: Once | ORAL | Status: AC | PRN
  Administered 2012-08-02: 5 mg via ORAL

## 2012-08-02 MED ORDER — BUPIVACAINE-EPINEPHRINE 0.25% -1:200000 IJ SOLN
INTRAMUSCULAR | Status: DC | PRN
  Administered 2012-08-02: 30 mL

## 2012-08-02 MED ORDER — HYDROMORPHONE HCL PF 1 MG/ML IJ SOLN
INTRAMUSCULAR | Status: AC
  Filled 2012-08-02: qty 1

## 2012-08-02 MED ORDER — OXYCODONE HCL 5 MG PO TABS: 5.0000 mg | ORAL_TABLET | ORAL | Status: DC | PRN

## 2012-08-02 MED ORDER — LACTATED RINGERS IV SOLN
INTRAVENOUS | Status: DC
  Administered 2012-08-02: 14:00:00 via INTRAVENOUS

## 2012-08-02 MED ORDER — LIDOCAINE HCL (CARDIAC) 20 MG/ML IV SOLN
INTRAVENOUS | Status: DC | PRN
  Administered 2012-08-02: 80 mg via INTRAVENOUS

## 2012-08-02 MED ORDER — ONDANSETRON HCL 4 MG/2ML IJ SOLN
INTRAMUSCULAR | Status: DC | PRN
  Administered 2012-08-02: 4 mg via INTRAVENOUS

## 2012-08-02 MED ORDER — 0.9 % SODIUM CHLORIDE (POUR BTL) OPTIME
TOPICAL | Status: DC | PRN
  Administered 2012-08-02: 1000 mL
  Administered 2012-08-02: 900 mL

## 2012-08-02 MED ORDER — MIDAZOLAM HCL 5 MG/5ML IJ SOLN
INTRAMUSCULAR | Status: DC | PRN
  Administered 2012-08-02: 2 mg via INTRAVENOUS

## 2012-08-02 MED ORDER — FENTANYL CITRATE 0.05 MG/ML IJ SOLN
INTRAMUSCULAR | Status: DC | PRN
  Administered 2012-08-02: 100 ug via INTRAVENOUS
  Administered 2012-08-02 (×3): 50 ug via INTRAVENOUS

## 2012-08-02 MED ORDER — DEXAMETHASONE SODIUM PHOSPHATE 4 MG/ML IJ SOLN
INTRAMUSCULAR | Status: DC | PRN
  Administered 2012-08-02: 4 mg via INTRAVENOUS

## 2012-08-02 MED ORDER — HYDROMORPHONE HCL PF 1 MG/ML IJ SOLN
0.2500 mg | INTRAMUSCULAR | Status: DC | PRN
  Administered 2012-08-02 (×4): 0.5 mg via INTRAVENOUS

## 2012-08-02 MED ORDER — LACTATED RINGERS IV SOLN
INTRAVENOUS | Status: DC | PRN
  Administered 2012-08-02 (×2): via INTRAVENOUS

## 2012-08-02 MED ORDER — OXYCODONE HCL 5 MG/5ML PO SOLN: 5.0000 mg | Freq: Once | ORAL | Status: AC | PRN

## 2012-08-02 MED ORDER — PROPOFOL 10 MG/ML IV BOLUS
INTRAVENOUS | Status: DC | PRN
  Administered 2012-08-02: 200 mg via INTRAVENOUS

## 2012-08-02 MED ORDER — NEOSTIGMINE METHYLSULFATE 1 MG/ML IJ SOLN
INTRAMUSCULAR | Status: DC | PRN
  Administered 2012-08-02: 4 mg via INTRAVENOUS

## 2012-08-02 MED ORDER — SODIUM CHLORIDE 0.9 % IR SOLN
Status: DC | PRN
  Administered 2012-08-02: 1000 mL

## 2012-08-02 MED ORDER — ROCURONIUM BROMIDE 100 MG/10ML IV SOLN
INTRAVENOUS | Status: DC | PRN
  Administered 2012-08-02: 50 mg via INTRAVENOUS

## 2012-08-02 MED ORDER — ONDANSETRON HCL 4 MG/2ML IJ SOLN: 4.0000 mg | Freq: Four times a day (QID) | INTRAMUSCULAR | Status: DC | PRN

## 2012-08-02 MED ORDER — OXYCODONE HCL 5 MG PO TABS
ORAL_TABLET | ORAL | Status: AC
  Filled 2012-08-02: qty 1

## 2012-08-02 MED ORDER — OXYCODONE-ACETAMINOPHEN 5-325 MG PO TABS: 1.0000 | ORAL_TABLET | ORAL | Status: DC | PRN

## 2012-08-02 MED ORDER — KETOROLAC TROMETHAMINE 15 MG/ML IJ SOLN
15.0000 mg | Freq: Four times a day (QID) | INTRAMUSCULAR | Status: DC
  Filled 2012-08-02 (×3): qty 1

## 2012-08-02 SURGICAL SUPPLY — 39 items
APPLIER CLIP 5 13 M/L LIGAMAX5 (MISCELLANEOUS) ×2
BLADE SURG ROTATE 9660 (MISCELLANEOUS) IMPLANT
CANISTER SUCTION 2500CC (MISCELLANEOUS) ×2 IMPLANT
CHLORAPREP W/TINT 26ML (MISCELLANEOUS) ×2 IMPLANT
CLIP APPLIE 5 13 M/L LIGAMAX5 (MISCELLANEOUS) ×1 IMPLANT
CLOTH BEACON ORANGE TIMEOUT ST (SAFETY) ×2 IMPLANT
COVER MAYO STAND STRL (DRAPES) ×2 IMPLANT
COVER SURGICAL LIGHT HANDLE (MISCELLANEOUS) ×2 IMPLANT
DECANTER SPIKE VIAL GLASS SM (MISCELLANEOUS) IMPLANT
DERMABOND ADVANCED (GAUZE/BANDAGES/DRESSINGS) ×1
DERMABOND ADVANCED .7 DNX12 (GAUZE/BANDAGES/DRESSINGS) ×1 IMPLANT
DRAPE C-ARM 42X72 X-RAY (DRAPES) ×2 IMPLANT
ELECT REM PT RETURN 9FT ADLT (ELECTROSURGICAL) ×2
ELECTRODE REM PT RTRN 9FT ADLT (ELECTROSURGICAL) ×1 IMPLANT
GLOVE BIO SURGEON STRL SZ7 (GLOVE) ×2 IMPLANT
GLOVE BIOGEL PI IND STRL 6.5 (GLOVE) ×2 IMPLANT
GLOVE BIOGEL PI IND STRL 7.5 (GLOVE) ×1 IMPLANT
GLOVE BIOGEL PI INDICATOR 6.5 (GLOVE) ×2
GLOVE BIOGEL PI INDICATOR 7.5 (GLOVE) ×1
GLOVE ORTHOPEDIC STR SZ6.5 (GLOVE) ×2 IMPLANT
GLOVE SURG SS PI 6.0 STRL IVOR (GLOVE) ×2 IMPLANT
GOWN STRL NON-REIN LRG LVL3 (GOWN DISPOSABLE) ×8 IMPLANT
KIT BASIN OR (CUSTOM PROCEDURE TRAY) ×2 IMPLANT
KIT ROOM TURNOVER OR (KITS) ×2 IMPLANT
NS IRRIG 1000ML POUR BTL (IV SOLUTION) ×2 IMPLANT
PAD ARMBOARD 7.5X6 YLW CONV (MISCELLANEOUS) ×2 IMPLANT
POUCH SPECIMEN RETRIEVAL 10MM (ENDOMECHANICALS) ×2 IMPLANT
SCISSORS LAP 5X35 DISP (ENDOMECHANICALS) IMPLANT
SET CHOLANGIOGRAPH 5 50 .035 (SET/KITS/TRAYS/PACK) ×2 IMPLANT
SET IRRIG TUBING LAPAROSCOPIC (IRRIGATION / IRRIGATOR) ×2 IMPLANT
SLEEVE ENDOPATH XCEL 5M (ENDOMECHANICALS) ×4 IMPLANT
SPECIMEN JAR SMALL (MISCELLANEOUS) ×2 IMPLANT
SUT MNCRL AB 4-0 PS2 18 (SUTURE) ×2 IMPLANT
SUT VICRYL 0 UR6 27IN ABS (SUTURE) ×2 IMPLANT
TOWEL OR 17X24 6PK STRL BLUE (TOWEL DISPOSABLE) ×2 IMPLANT
TOWEL OR 17X26 10 PK STRL BLUE (TOWEL DISPOSABLE) ×2 IMPLANT
TRAY LAPAROSCOPIC (CUSTOM PROCEDURE TRAY) ×2 IMPLANT
TROCAR XCEL BLUNT TIP 100MML (ENDOMECHANICALS) ×2 IMPLANT
TROCAR XCEL NON-BLD 5MMX100MML (ENDOMECHANICALS) ×2 IMPLANT

## 2012-08-02 NOTE — Transfer of Care (Signed)
Immediate Anesthesia Transfer of Care Note  Patient: [name]  Procedure(s) Performed: Procedure(s) (LRB) with comments: LAPAROSCOPIC CHOLECYSTECTOMY WITH INTRAOPERATIVE CHOLANGIOGRAM (N/A)  Patient Location: PACU  Anesthesia Type:General  Level of Consciousness: awake, alert , oriented and patient cooperative  Airway & Oxygen Therapy: Patient Spontanous Breathing and Patient connected to nasal cannula oxygen  Post-op Assessment: Report given to PACU RN, Post -op Vital signs reviewed and stable and Patient moving all extremities  Post vital signs: Reviewed and stable  Complications: No apparent anesthesia complications

## 2012-08-02 NOTE — Preoperative (Signed)
Beta Blockers   Reason not to administer Beta Blockers:Not Applicable 

## 2012-08-02 NOTE — H&P (View-Only) (Signed)
[name], female   DOB: September 04, 1970, 41 y.o.   MRN: [id number]  Chief Complaint  Patient presents with  . Cholelithiasis    new pt- eval GB w/ stones    HPI [name] is a 41 y.o. female.  Referred by Dr. Jeannetta Nap HPI This is a 41 year old female who presents with about a one-year history of right upper quadrant pain that radiates to her back, nausea, heartburn, some emesis. This is associated with some watery stools. This is made worse when she fried food. The heartburn is a somewhat with Prilosec. This has gotten more frequent over the last year and now is occurring 2 times a week. The pain is the major issue for her at this point. She has undergone ultrasound in November of that shows a 1 cm gallstone with no pericholecystic fluid or any gallbladder wall thickening present. She comes in today she is currently having an attack. Past Medical History  Diagnosis Date  . GERD (gastroesophageal reflux disease)   . IBS (irritable bowel syndrome)     Past Surgical History  Procedure Date  . Knee arthroscopy 2011    left  . Wisdom tooth extraction   . Hysteroscopy w/d&c 06/22/2012    Procedure: DILATATION AND CURETTAGE /HYSTEROSCOPY;  Surgeon: Antionette Char, MD;  Location: [facility];  Service: Gynecology;  Laterality: N/A;    Family History  Problem Relation Age of Onset  . Heart disease Father   . Stroke Father   . Cancer Maternal Grandfather     colon  . Cancer Paternal Grandmother     stomach    Social History History  Substance Use Topics  . Smoking status: Current Every Day Smoker -- 1.0 packs/day for 25 years    Types: Cigarettes  . Smokeless tobacco: Never Used  . Alcohol Use: Yes     Comment: socially    Allergies  Allergen Reactions  . Codeine Nausea And Vomiting    Pt can take Vicodin if given with promethazine    Current Outpatient Prescriptions  Medication Sig Dispense Refill  . dicyclomine (BENTYL) 20 MG tablet Take 20 mg by mouth  4 (four) times daily as needed. Takes prior to meals and at bedtime as needed for upset stomach      . ibuprofen (ADVIL,MOTRIN) 200 MG tablet Take 400 mg by mouth 3 (three) times daily as needed. For pain      . omeprazole (PRILOSEC) 20 MG capsule Take 40 mg by mouth daily.        Review of Systems Review of Systems  Constitutional: Negative for fever, chills and unexpected weight change.  HENT: Negative for hearing loss, congestion, sore throat, trouble swallowing and voice change.   Eyes: Negative for visual disturbance.  Respiratory: Negative for cough and wheezing.   Cardiovascular: Positive for chest pain. Negative for palpitations and leg swelling.  Gastrointestinal: Positive for nausea, abdominal pain and diarrhea. Negative for vomiting, constipation, blood in stool, abdominal distention and anal bleeding.  Genitourinary: Negative for hematuria, vaginal bleeding and difficulty urinating.  Musculoskeletal: Negative for arthralgias.  Skin: Negative for rash and wound.  Neurological: Positive for weakness and headaches. Negative for seizures and syncope.  Hematological: Negative for adenopathy. Does not bruise/bleed easily.  Psychiatric/Behavioral: Negative for confusion.    Blood pressure 122/84, pulse 80, temperature 99.2 F (37.3 C), temperature source Temporal, resp. rate 18, height 5' (1.524 m), weight 186 lb 12.8 oz (84.732 kg).  Physical Exam Physical Exam  Vitals reviewed.  Constitutional: She appears well-developed and well-nourished.  Eyes: No scleral icterus.  Neck: Neck supple.  Cardiovascular: Normal rate, regular rhythm and normal heart sounds.   Pulmonary/Chest: Effort normal and breath sounds normal. She has no wheezes. She has no rales.  Abdominal: Soft. Normal appearance and bowel sounds are normal. She exhibits no distension. There is tenderness. There is negative Murphy's sign. No hernia.  Lymphadenopathy:    She has no cervical adenopathy.    Data  Reviewed LIMITED ABDOMINAL ULTRASOUND - RIGHT UPPER QUADRANT  Comparison: CT abdomen and pelvis 03/26/2009.  Findings:  Gallbladder: A 1.0 cm stone is identified within the gallbladder.  There is no pericholecystic fluid or gallbladder wall thickening.  Common bile duct: Measures 0.4 cm.  Liver: Demonstrates normal echotexture. No intrahepatic biliary  ductal dilatation or focal lesion.  IMPRESSION:  Single 1 cm gallstone without evidence of cholecystitis.   Assessment    Symptomatic cholelithiasis    Plan    Laparoscopic cholecystectomy, cholangiogram  She clearly is having symptoms related to her gallbladder. I did tell her that some of the symptoms that she hasn't may not get better with cholecystectomy. In fact some of them including her diarrhea may actually get worse. I'm going to check her liver function tests today as I cannot find any evidence of disease. She does give a history of having some acholic stools. Because of this I will follow up on her labs. Her labs show any abnormalities I will call her in discussed putting her in the hospital now. Otherwise on the schedule her as soon as possible for laparoscopic cholecystectomy for symptomatic cholelithiasis.  I discussed the procedure in detail. We discussed the risks and benefits of a laparoscopic cholecystectomy and possible cholangiogram including, but not limited to bleeding, infection, injury to surrounding structures such as the intestine or liver, bile leak, retained gallstones, need to convert to an open procedure, prolonged diarrhea, blood clots such as  DVT, common bile duct injury, anesthesia risks, and possible need for additional procedures.  The likelihood of improvement in symptoms and return to the patient's normal status is good. We discussed the typical post-operative recovery course.        Anitra Doxtater 07/30/2012, 12:07 PM

## 2012-08-02 NOTE — Anesthesia Procedure Notes (Signed)
Procedure Name: Intubation Date/Time: 08/02/2012 3:05 PM Performed by: Angelica Pou Pre-anesthesia Checklist: Patient identified, Timeout performed, Emergency Drugs available, Suction available and Patient being monitored Patient Re-evaluated:Patient Re-evaluated prior to inductionOxygen Delivery Method: Circle system utilized Preoxygenation: Pre-oxygenation with 100% oxygen Intubation Type: IV induction Ventilation: Mask ventilation without difficulty and Oral airway inserted - appropriate to patient size Laryngoscope Size: Mac and 3 Grade View: Grade II Tube type: Oral Tube size: 7.5 mm Number of attempts: 1 Airway Equipment and Method: Stylet and Oral airway Placement Confirmation: ETT inserted through vocal cords under direct vision,  breath sounds checked- equal and bilateral and positive ETCO2 Secured at: 21 cm Tube secured with: Tape Dental Injury: Teeth and Oropharynx as per pre-operative assessment

## 2012-08-02 NOTE — Op Note (Signed)
Preoperative diagnosis: Symptomatic cholelithiasis Postoperative diagnosis: Same as above Procedure: Laparoscopic cholecystectomy Surgeon: Dr. Harden Mo Anesthesia: Gen. Endotracheal Estimated blood loss: Minimal Sponge and needle count correct an operation Specimens: Gallbladder and contents to pathology Disposition to recovery in in stable condition Complications: None Drains: None  Indications: This is an 41 year old female who has had right upper quadrant abdominal pain in associated with some back pain as well as nausea. On ultrasound she has a single 1 cm gallstone. We discussed a laparoscopic cholecystectomy for appears to be biliary colic as the source of her symptoms.  Procedure: After informed consent was obtained the patient was taken to the operating room. She was administered cefoxitin. Sequential compression devices were placed on legs. She was placed under general endotracheal anesthesia without complication. Her abdomen was then prepped and draped in the standard sterile surgical fashion. A surgical timeout was then performed.  I infiltrated marcaine below the umbilicus. I made a vertical incision carried this out to her fascia. I grasped her umbilical stalk with a Kocher clamp. I entered this sharply. I entered the peritoneum sharply under direct vision. I then placed a 0 Vicryl pursestring suture through the fascia. A Hassan trocar was introduced and the abdomen was then insufflated to 15 mmHg pressure. I then inserted 3 further 5 mm trocars in the epigastrium and right upper quadrant after infiltration with local anesthetic without complication. I then retracted the gallbladder cephalad and lateral. There was a fair amount of scarring in her triangle and there was a lot of fat encasing the gallbladder. This dissection was somewhat difficult as she did appear to have evidence of chronic cholecystitis. Eventually I was able to obtain the critical view of safety. I clipped the  artery 3 times and divided it. There was a small posterior branch I treated in a similar fashion. I was also going to do a cholangiogram. I placed a clip distally on the duct. I then made a ductotomy and the duct was so diminutive and friable that it fell apart. I then grasped the proximal portion of the duct and placed 3 clips on this without any difficulty. This clearly was away from my common duct as well. I then removed the gallbladder from the liver bed. I made a small entrance into the gallbladder where it was scarred in. There was a small spillage of bile. I then removed the gallbladder from the liver bed placed in an Endo Catch bag and removed from her umbilicus. I then obtained hemostasis. Irrigation was performed. I then removed my Hassan trocar. I did place an additional figure-of-eight 0 Vicryl stitch to obliterate this defect. I then viewed this area and there was no evidence of an entry injury. I then removed all trocars while desufflated the abdomen. I closed the incision with 4-0 Monocryl and Dermabond. She tolerated this well was extubated and transferred to recovery in stable condition.

## 2012-08-02 NOTE — Interval H&P Note (Signed)
History and Physical Interval Note:  08/02/2012 2:49 PM  [name]  has presented today for surgery, with the diagnosis of symptomatic cholelithiasis  The various methods of treatment have been discussed with the patient and family. After consideration of risks, benefits and other options for treatment, the patient has consented to  Procedure(s) (LRB) with comments: LAPAROSCOPIC CHOLECYSTECTOMY WITH INTRAOPERATIVE CHOLANGIOGRAM (N/A) as a surgical intervention .  The patient's history has been reviewed, patient examined, no change in status, stable for surgery.  I have reviewed the patient's chart and labs.  Questions were answered to the patient's satisfaction.     Rowe Warman

## 2012-08-02 NOTE — Anesthesia Preprocedure Evaluation (Signed)
Anesthesia Evaluation  Patient identified by MRN, date of birth, ID band Patient awake    Reviewed: Allergy & Precautions, H&P , NPO status , Patient's Chart, lab work & pertinent test results  Airway Mallampati: II  Neck ROM: full    Dental   Pulmonary Current Smoker,          Cardiovascular     Neuro/Psych Anxiety Depression    GI/Hepatic GERD-  ,  Endo/Other  obese  Renal/GU      Musculoskeletal   Abdominal   Peds  Hematology   Anesthesia Other Findings   Reproductive/Obstetrics                           Anesthesia Physical Anesthesia Plan  ASA: II  Anesthesia Plan: General   Post-op Pain Management:    Induction: Intravenous  Airway Management Planned: Oral ETT  Additional Equipment:   Intra-op Plan:   Post-operative Plan: Extubation in OR  Informed Consent: I have reviewed the patients History and Physical, chart, labs and discussed the procedure including the risks, benefits and alternatives for the proposed anesthesia with the patient or authorized representative who has indicated his/her understanding and acceptance.     Plan Discussed with: CRNA and Surgeon  Anesthesia Plan Comments:         Anesthesia Quick Evaluation

## 2012-08-02 NOTE — Anesthesia Postprocedure Evaluation (Signed)
  Anesthesia Post-op Note  Patient: [name]  Procedure(s) Performed: Procedure(s) (LRB) with comments: LAPAROSCOPIC CHOLECYSTECTOMY WITH INTRAOPERATIVE CHOLANGIOGRAM (N/A)  Patient Location: PACU  Anesthesia Type:General  Level of Consciousness: awake, oriented and patient cooperative  Airway and Oxygen Therapy: Patient Spontanous Breathing  Post-op Pain: mild  Post-op Assessment: Post-op Vital signs reviewed, Patient's Cardiovascular Status Stable, Respiratory Function Stable, Patent Airway, No signs of Nausea or vomiting and Pain level controlled  Post-op Vital Signs: stable  Complications: No apparent anesthesia complications

## 2012-08-03 ENCOUNTER — Encounter (HOSPITAL_COMMUNITY): Payer: Self-pay | Admitting: General Surgery

## 2012-08-06 ENCOUNTER — Telehealth (INDEPENDENT_AMBULATORY_CARE_PROVIDER_SITE_OTHER): Payer: Self-pay | Admitting: General Surgery

## 2012-08-06 ENCOUNTER — Other Ambulatory Visit (INDEPENDENT_AMBULATORY_CARE_PROVIDER_SITE_OTHER): Payer: Self-pay | Admitting: General Surgery

## 2012-08-06 DIAGNOSIS — Z09 Encounter for follow-up examination after completed treatment for conditions other than malignant neoplasm: Secondary | ICD-10-CM

## 2012-08-06 MED ORDER — PROMETHAZINE HCL 12.5 MG PO TABS: 12.5000 mg | ORAL_TABLET | Freq: Four times a day (QID) | ORAL | Status: DC | PRN

## 2012-08-06 NOTE — Telephone Encounter (Signed)
Pt called to report she is severely nauseated from pain meds.  She had lap chole on Friday.  Denies vomiting, only has nausea.  Has been taking Dramamine OTC with some mild effect.  Recommended she sip gingerale and will contact Dr. Dwain Sarna with update.  She is taking Percocet, but is very sensitive to any codeine (causes nausea.)  Please advise

## 2012-08-06 NOTE — Telephone Encounter (Signed)
LM on VM to pick up nausea meds called in to [facility].

## 2012-08-06 NOTE — Telephone Encounter (Signed)
I put in rx for phenergan [facility]

## 2012-08-07 ENCOUNTER — Telehealth (INDEPENDENT_AMBULATORY_CARE_PROVIDER_SITE_OTHER): Payer: Self-pay | Admitting: General Surgery

## 2012-08-07 NOTE — Telephone Encounter (Signed)
[name] called to request pain medication refill after surgery last week. This is her first request after surgery/ Hydrocodone 5/325 #30 called to [facility] [address] [phone]/ pt aware.gy

## 2012-08-17 ENCOUNTER — Encounter (INDEPENDENT_AMBULATORY_CARE_PROVIDER_SITE_OTHER): Payer: Medicaid Other | Admitting: General Surgery

## 2012-12-11 ENCOUNTER — Telehealth: Payer: Self-pay | Admitting: Gastroenterology

## 2012-12-11 NOTE — Telephone Encounter (Signed)
abd pain that doubles her over, bright red blood and tarry stools x 3 days, pt says pain is severe.  [name] has been notified that the pt should have an ER evaluation with severe abd pain.

## 2013-12-14 ENCOUNTER — Emergency Department (INDEPENDENT_AMBULATORY_CARE_PROVIDER_SITE_OTHER)
Admission: EM | Admit: 2013-12-14 | Discharge: 2013-12-14 | Disposition: A | Discharge status: Home / Self Care | Payer: Medicare Other | Source: Home / Self Care | Attending: Family Medicine | Admitting: Family Medicine

## 2013-12-14 ENCOUNTER — Encounter (HOSPITAL_COMMUNITY): Payer: Self-pay | Admitting: Emergency Medicine

## 2013-12-14 DIAGNOSIS — N12 Tubulo-interstitial nephritis, not specified as acute or chronic: Secondary | ICD-10-CM

## 2013-12-14 LAB — COMPREHENSIVE METABOLIC PANEL
ALK PHOS: 76 U/L (ref 39–117)
ALT: 29 U/L (ref 0–35)
AST: 27 U/L (ref 0–37)
Albumin: 3.8 g/dL (ref 3.5–5.2)
BILIRUBIN TOTAL: 0.3 mg/dL (ref 0.3–1.2)
BUN: 5 mg/dL — AB (ref 6–23)
CHLORIDE: 101 meq/L (ref 96–112)
CO2: 25 meq/L (ref 19–32)
Calcium: 9.4 mg/dL (ref 8.4–10.5)
Creatinine, Ser: 0.77 mg/dL (ref 0.50–1.10)
GLUCOSE: 101 mg/dL — AB (ref 70–99)
POTASSIUM: 4.3 meq/L (ref 3.7–5.3)
Sodium: 141 mEq/L (ref 137–147)
Total Protein: 7.2 g/dL (ref 6.0–8.3)

## 2013-12-14 LAB — POCT URINALYSIS DIP (DEVICE)
BILIRUBIN URINE: NEGATIVE
GLUCOSE, UA: NEGATIVE mg/dL
Hgb urine dipstick: NEGATIVE
Ketones, ur: NEGATIVE mg/dL
NITRITE: NEGATIVE
PH: 6.5 (ref 5.0–8.0)
Protein, ur: NEGATIVE mg/dL
Specific Gravity, Urine: 1.015 (ref 1.005–1.030)
Urobilinogen, UA: 0.2 mg/dL (ref 0.0–1.0)

## 2013-12-14 LAB — CBC
HEMATOCRIT: 46.3 % — AB (ref 36.0–46.0)
HEMOGLOBIN: 15.4 g/dL — AB (ref 12.0–15.0)
MCH: 32.1 pg (ref 26.0–34.0)
MCHC: 33.3 g/dL (ref 30.0–36.0)
MCV: 96.5 fL (ref 78.0–100.0)
Platelets: 320 10*3/uL (ref 150–400)
RBC: 4.8 MIL/uL (ref 3.87–5.11)
RDW: 13.4 % (ref 11.5–15.5)
WBC: 9.1 10*3/uL (ref 4.0–10.5)

## 2013-12-14 LAB — POCT PREGNANCY, URINE: Preg Test, Ur: NEGATIVE

## 2013-12-14 MED ORDER — PHENAZOPYRIDINE HCL 95 MG PO TABS: 95.0000 mg | ORAL_TABLET | Freq: Three times a day (TID) | ORAL | Status: DC | PRN

## 2013-12-14 MED ORDER — ONDANSETRON HCL 8 MG PO TABS: 8.0000 mg | ORAL_TABLET | Freq: Three times a day (TID) | ORAL | Status: DC | PRN

## 2013-12-14 MED ORDER — CEPHALEXIN 500 MG PO CAPS: 500.0000 mg | ORAL_CAPSULE | Freq: Four times a day (QID) | ORAL | Status: DC

## 2013-12-14 NOTE — ED Notes (Signed)
C/o lower back and bilateral flank pain since yesterday.  Urinary urgency/frequency.  Nausea. And diarrhea.   Denies fever and any other symptoms.  Pt has increased water intake with no relief.

## 2013-12-14 NOTE — Discharge Instructions (Signed)
Thank you for coming in today. Take Pyridium as needed for pain. Take Keflex 4 times daily. Return as needed. If your belly pain worsens, or you have high fever, bad vomiting, blood in your stool or black tarry stool go to the Emergency Room.   Pyelonephritis, Adult Pyelonephritis is a kidney infection. In general, there are 2 main types of pyelonephritis:  Infections that come on quickly without any warning (acute pyelonephritis).  Infections that persist for a long period of time (chronic pyelonephritis). CAUSES  Two main causes of pyelonephritis are:  Bacteria traveling from the bladder to the kidney. This is a problem especially in pregnant women. The urine in the bladder can become filled with bacteria from multiple causes, including:  Inflammation of the prostate gland (prostatitis).  Sexual intercourse in females.  Bladder infection (cystitis).  Bacteria traveling from the bloodstream to the tissue part of the kidney. Problems that may increase your risk of getting a kidney infection include:  Diabetes.  Kidney stones or bladder stones.  Cancer.  Catheters placed in the bladder.  Other abnormalities of the kidney or ureter. SYMPTOMS   Abdominal pain.  Pain in the side or flank area.  Fever.  Chills.  Upset stomach.  Blood in the urine (dark urine).  Frequent urination.  Strong or persistent urge to urinate.  Burning or stinging when urinating. DIAGNOSIS  Your caregiver may diagnose your kidney infection based on your symptoms. A urine sample may also be taken. TREATMENT  In general, treatment depends on how severe the infection is.   If the infection is mild and caught early, your caregiver may treat you with oral antibiotics and send you home.  If the infection is more severe, the bacteria may have gotten into the bloodstream. This will require intravenous (IV) antibiotics and a hospital stay. Symptoms may include:  High fever.  Severe flank  pain.  Shaking chills.  Even after a hospital stay, your caregiver may require you to be on oral antibiotics for a period of time.  Other treatments may be required depending upon the cause of the infection. HOME CARE INSTRUCTIONS   Take your antibiotics as directed. Finish them even if you start to feel better.  Make an appointment to have your urine checked to make sure the infection is gone.  Drink enough fluids to keep your urine clear or pale yellow.  Take medicines for the bladder if you have urgency and frequency of urination as directed by your caregiver. SEEK IMMEDIATE MEDICAL CARE IF:   You have a fever or persistent symptoms for more than 2-3 days.  You have a fever and your symptoms suddenly get worse.  You are unable to take your antibiotics or fluids.  You develop shaking chills.  You experience extreme weakness or fainting.  There is no improvement after 2 days of treatment. MAKE SURE YOU:  Understand these instructions.  Will watch your condition.  Will get help right away if you are not doing well or get worse. Document Released: 08/15/2005 Document Revised: 02/14/2012 Document Reviewed: 01/19/2011 [facility] Patient Information 2014 [place], [place].

## 2013-12-14 NOTE — ED Provider Notes (Signed)
IRETTA MANGRUM is a 43 y.o. female who presents to Urgent Care today for bilateral flank pain. Patient has had nausea diarrhea urinary frequency and urgency associated with bilateral flank pain. Symptoms started yesterday. She has not tried any medications yet. She denies any vomiting. She denies any significant blood in her diarrhea.   Past Medical History  Diagnosis Date  . GERD (gastroesophageal reflux disease)   . IBS (irritable bowel syndrome)   . Anxiety   . Depression    History  Substance Use Topics  . Smoking status: Current Every Day Smoker -- 1.00 packs/day for 25 years    Types: Cigarettes  . Smokeless tobacco: Never Used  . Alcohol Use: Yes     Comment: socially   ROS as above Medications: No current facility-administered medications for this encounter.   Current Outpatient Prescriptions  Medication Sig Dispense Refill  . dicyclomine (BENTYL) 20 MG tablet Take 20 mg by mouth 4 (four) times daily as needed. Takes prior to meals and at bedtime as needed for upset stomach      . omeprazole (PRILOSEC) 20 MG capsule Take 40 mg by mouth daily.      . cephALEXin (KEFLEX) 500 MG capsule Take 1 capsule (500 mg total) by mouth 4 (four) times daily.  40 capsule  0  . ibuprofen (ADVIL,MOTRIN) 200 MG tablet Take 400 mg by mouth 3 (three) times daily as needed. For pain      . ondansetron (ZOFRAN) 8 MG tablet Take 1 tablet (8 mg total) by mouth every 8 (eight) hours as needed for nausea or vomiting.  20 tablet  0  . oxyCODONE-acetaminophen (ROXICET) 5-325 MG per tablet Take 1 tablet by mouth every 4 (four) hours as needed for pain.  30 tablet  0  . phenazopyridine (PYRIDIUM) 95 MG tablet Take 1 tablet (95 mg total) by mouth 3 (three) times daily as needed for pain.  10 tablet  0  . promethazine (PHENERGAN) 12.5 MG tablet Take 1 tablet (12.5 mg total) by mouth every 6 (six) hours as needed for nausea.  10 tablet  0  . promethazine (PHENERGAN) 12.5 MG tablet Take 1 tablet (12.5 mg  total) by mouth every 6 (six) hours as needed for nausea.  20 tablet  0    Exam:  BP 138/79  Pulse 76  Temp(Src) 98 F (36.7 C) (Oral)  Resp 20  SpO2 97% Gen: Well NAD HEENT: EOMI,  MMM Lungs: Normal work of breathing. CTABL Heart: RRR no MRG Abd: NABS, Soft. NT, ND mildly tender bilateral CVA tenderness to percussion Exts: Brisk capillary refill, warm and well perfused.   Results for orders placed during the hospital encounter of 12/14/13 (from the past 24 hour(s))  POCT URINALYSIS DIP (DEVICE)     Status: Abnormal   Collection Time    12/14/13  7:01 PM      Result Value Ref Range   Glucose, UA NEGATIVE  NEGATIVE mg/dL   Bilirubin Urine NEGATIVE  NEGATIVE   Ketones, ur NEGATIVE  NEGATIVE mg/dL   Specific Gravity, Urine 1.015  1.005 - 1.030   Hgb urine dipstick NEGATIVE  NEGATIVE   pH 6.5  5.0 - 8.0   Protein, ur NEGATIVE  NEGATIVE mg/dL   Urobilinogen, UA 0.2  0.0 - 1.0 mg/dL   Nitrite NEGATIVE  NEGATIVE   Leukocytes, UA TRACE (*) NEGATIVE  POCT PREGNANCY, URINE     Status: None   Collection Time    12/14/13  7:05 PM  Result Value Ref Range   Preg Test, Ur NEGATIVE  NEGATIVE  CBC     Status: Abnormal   Collection Time    12/14/13  7:49 PM      Result Value Ref Range   WBC 9.1  4.0 - 10.5 K/uL   RBC 4.80  3.87 - 5.11 MIL/uL   Hemoglobin 15.4 (*) 12.0 - 15.0 g/dL   HCT 46.3 (*) 36.0 - 46.0 %   MCV 96.5  78.0 - 100.0 fL   MCH 32.1  26.0 - 34.0 pg   MCHC 33.3  30.0 - 36.0 g/dL   RDW 13.4  11.5 - 15.5 %   Platelets 320  150 - 400 K/uL   No results found.  Assessment and Plan: 43 y.o. female with UTI versus pyelonephritis.  Patient appears to be clinically stable. Urine culture pending. Comprehensive metabolic panel is also pending.  Plan to treat with Keflex, Pyridium, and Zofran.  Followup CMP.  Discussed warning signs or symptoms. Please see discharge instructions. Patient expresses understanding.    Gregor Hams, MD 12/14/13 2022

## 2013-12-16 LAB — URINE CULTURE: Colony Count: 40000

## 2014-01-16 ENCOUNTER — Encounter: Payer: Self-pay | Admitting: Gastroenterology

## 2014-03-24 ENCOUNTER — Encounter: Payer: Self-pay | Admitting: Gastroenterology

## 2014-03-24 ENCOUNTER — Ambulatory Visit (INDEPENDENT_AMBULATORY_CARE_PROVIDER_SITE_OTHER): Payer: Medicare Other | Admitting: Gastroenterology

## 2014-03-24 VITALS — BP 102/70 | HR 68 | Ht 60.0 in | Wt 177.0 lb

## 2014-03-24 DIAGNOSIS — R197 Diarrhea, unspecified: Secondary | ICD-10-CM

## 2014-03-24 DIAGNOSIS — K219 Gastro-esophageal reflux disease without esophagitis: Secondary | ICD-10-CM

## 2014-03-24 DIAGNOSIS — K921 Melena: Secondary | ICD-10-CM

## 2014-03-24 MED ORDER — MOVIPREP 100 G PO SOLR: 1.0000 | Freq: Once | ORAL | Status: DC

## 2014-03-24 NOTE — Patient Instructions (Addendum)
One of your biggest health concerns is your smoking.  This increases your risk for most cancers and serious cardiovascular diseases such as strokes, heart attacks.  You should try your best to stop.  If you need assistance, please contact your PCP or Smoking Cessation Class at [facility] [phone]) or [place] (1-800-QUIT-NOW). You will be set up for a colonoscopy for rectal bleeding, diarrhea. You will be set up for an upper endoscopy for GERD. You really need to cut back on caffeine as best that you can (can contribute to GERD and diarrhea).

## 2014-03-24 NOTE — Progress Notes (Signed)
Review of gastrointestinal problems:  1. Daily rectal bleeding, summer 2010. Likely hemorrhoidal. Flexible sigmoidoscopy Ardis Hughs August 2010 suggested edematous distal colon however biopsies showed no active or chronic colitis. This may have been prep effect. Being referred to general surgery, September 2010. CBC showed that she is not anemic, sedimentation rate was normal summer 2010.   Colonoscopy Dr. Jim Desanctis, 2005, done for rectal bleeding, findings were hemorrhoids. 2. irritable bowel, diarrhea predominant. Twice daily antispasmodics help but not completely. Starting sublingual as needed antispasmodics September 2010. CT Scan summer 2010 IV and oral contrast showed left-sided diverticulosis, right sided complex ovarian cyst that is being followed by her gynecologist. 3. Esophageal candidiasis 05/2009 EGD Ardis Hughs, following Abx, treated with diflucan   HPI: This is a   very pleasant 43 year old woman whom I last saw about 5 years ago. She has been sober now for drugs and alcohol for about 4 years. She is very proud of that fact and I'm proud of her.   Having a lot of problems with upper and lower GI symptoms.  Has red rectal bleeding, occasionally dark stools. This worse when emotionally stressed.  Has a "fire in her stomach."  Recently started carafate and that helps.   Never went to the general surgeon for hemorrhoids.  Sober for 4 years, no drinking at all.  No appetitie.  Has lost 20 pounds in pas several months.  Still tends to have 7-8 loose BMs daily.  Most prior to lunch.  She has tried imodium and may work too well at times.  Drinks 2 liters of Dr. Malachi Bonds daily.  Drinks 1 coffee in AM.   Takes NSAIDs about once weekly.  Takes dexlinant every morning.   Review of systems: Pertinent positive and negative review of systems were noted in the above HPI section. Complete review of systems was performed and was otherwise normal.    Past Medical History  Diagnosis Date  .  GERD (gastroesophageal reflux disease)   . IBS (irritable bowel syndrome)   . Anxiety   . Depression     Past Surgical History  Procedure Laterality Date  . Knee arthroscopy  2011    left  . Wisdom tooth extraction    . Hysteroscopy w/d&c  06/22/2012    Procedure: DILATATION AND CURETTAGE /HYSTEROSCOPY;  Surgeon: Lahoma Crocker, MD;  Location: [facility];  Service: Gynecology;  Laterality: N/A;  . Dilation and curettage of uterus    . Cholecystectomy  08/02/2012    Procedure: LAPAROSCOPIC CHOLECYSTECTOMY WITH INTRAOPERATIVE CHOLANGIOGRAM;  Surgeon: Rolm Bookbinder, MD;  Location: [facility];  Service: General;  Laterality: N/A;    Current Outpatient Prescriptions  Medication Sig Dispense Refill  . citalopram (CELEXA) 10 MG tablet Take 10 mg by mouth daily.      . clonazePAM (KLONOPIN) 0.5 MG tablet Take 0.5 mg by mouth 2 (two) times daily as needed for anxiety.      [name] dexlansoprazole (DEXILANT) 60 MG capsule Take 60 mg by mouth daily.       No current facility-administered medications for this visit.    Allergies as of 03/24/2014 - Review Complete 03/24/2014  Allergen Reaction Noted  . Codeine Nausea And Vomiting 05/14/2007    Family History  Problem Relation Age of Onset  . Heart disease Father   . Stroke Father   . Cancer Maternal Grandfather     colon  . Cancer Paternal Grandmother     stomach    History   Social History  . Marital Status:  Single    Spouse Name: N/A    Number of Children: N/A  . Years of Education: N/A   Occupational History  . Not on file.   Social History Main Topics  . Smoking status: Current Every Day Smoker -- 1.00 packs/day for 25 years    Types: Cigarettes  . Smokeless tobacco: Never Used  . Alcohol Use: No  . Drug Use: No  . Sexual Activity: Yes    Birth Control/ Protection: None   Other Topics Concern  . Not on file   Social History Narrative  . No narrative on file       Physical Exam: BP 102/70  Pulse 68  Ht 5'  (1.524 m)  Wt 177 lb (80.287 kg)  BMI 34.57 kg/m2 Constitutional: generally well-appearing Psychiatric: alert and oriented x3 Eyes: extraocular movements intact Mouth: oral pharynx moist, no lesions Neck: supple no lymphadenopathy Cardiovascular: heart regular rate and rhythm Lungs: clear to auscultation bilaterally Abdomen: soft, nontender, nondistended, no obvious ascites, no peritoneal signs, normal bowel sounds Extremities: no lower extremity edema bilaterally Skin: no lesions on visible extremities    Assessment and plan: 43 y.o. female with  upper and lower GI symptoms including diarrhea, significant GERD, anorexia, rectal bleeding  She drinks an exorbitant amount of caffeine every day. 2 L of Dr. Malachi Bonds and at least 1 coffee. She understands that caffeine can contribute to GERD, diarrhea, nausea. She is probably taking in one to 2000 completely useless calories as well. She is going to try to dramatically cut back her caffeine intake. We'll proceed with colonoscopy and upper endoscopy for the bleeding, significant GERD diarrhea.,

## 2014-04-09 ENCOUNTER — Other Ambulatory Visit: Payer: Self-pay

## 2014-04-09 ENCOUNTER — Encounter: Payer: Self-pay | Admitting: Gastroenterology

## 2014-04-09 ENCOUNTER — Ambulatory Visit (AMBULATORY_SURGERY_CENTER): Payer: Medicare Other | Admitting: Gastroenterology

## 2014-04-09 ENCOUNTER — Telehealth: Payer: Self-pay

## 2014-04-09 VITALS — BP 118/77 | HR 76 | Temp 98.3°F | Resp 17 | Ht 60.0 in | Wt 177.0 lb

## 2014-04-09 DIAGNOSIS — R197 Diarrhea, unspecified: Secondary | ICD-10-CM | POA: Diagnosis not present

## 2014-04-09 DIAGNOSIS — K219 Gastro-esophageal reflux disease without esophagitis: Secondary | ICD-10-CM | POA: Diagnosis not present

## 2014-04-09 DIAGNOSIS — K625 Hemorrhage of anus and rectum: Secondary | ICD-10-CM

## 2014-04-09 DIAGNOSIS — K649 Unspecified hemorrhoids: Secondary | ICD-10-CM

## 2014-04-09 DIAGNOSIS — D126 Benign neoplasm of colon, unspecified: Secondary | ICD-10-CM

## 2014-04-09 DIAGNOSIS — K921 Melena: Secondary | ICD-10-CM

## 2014-04-09 HISTORY — PX: COLONOSCOPY WITH PROPOFOL: SHX5780

## 2014-04-09 HISTORY — PX: ESOPHAGOGASTRODUODENOSCOPY (EGD) WITH PROPOFOL: SHX5813

## 2014-04-09 MED ORDER — SODIUM CHLORIDE 0.9 % IV SOLN: 500.0000 mL | INTRAVENOUS | Status: DC

## 2014-04-09 NOTE — Telephone Encounter (Signed)
CCS to notify pt

## 2014-04-09 NOTE — Telephone Encounter (Signed)
Message copied by Barron Alvine on Wed Apr 09, 2014  3:36 PM ------      Message from: Darcey Nora      Created: Wed Apr 09, 2014  3:20 PM       Pt has appt with Dr Rosendo Gros on 04/24/2014 at 10 am.       Thanks       sonya      ----- Message -----         From: Barron Alvine, CMA         Sent: 04/09/2014   2:23 PM           To: Sonya Yebra            Pt needs appt for general surgery referral for hemorrhoids thank you       ------

## 2014-04-09 NOTE — Op Note (Signed)
[place]  [address] [place], [id number]   ENDOSCOPY PROCEDURE REPORT  PATIENT: Michelle, Walls  MR#: [id number] BIRTHDATE: Aug 01, 1971 , 42  yrs. old GENDER: Female ENDOSCOPIST: Milus Banister, MD PROCEDURE DATE:  04/09/2014 PROCEDURE:  EGD, diagnostic ASA CLASS:     Class II INDICATIONS:  GERD. MEDICATIONS: MAC sedation, administered by CRNA and Propofol (Diprivan) 80 mg IV TOPICAL ANESTHETIC: none  DESCRIPTION OF PROCEDURE: After the risks benefits and alternatives of the procedure were thoroughly explained, informed consent was obtained.  The LB [id number] endoscope was introduced through the mouth and advanced to the second portion of the duodenum. Without limitations.  The instrument was slowly withdrawn as the mucosa was fully examined.      The upper, middle and distal third of the esophagus were carefully inspected and no abnormalities were noted.  The z-line was well seen at the GEJ.  The endoscope was pushed into the fundus which was normal including a retroflexed view.  The antrum, gastric body, first and second part of the duodenum were unremarkable. Retroflexed views revealed no abnormalities.     The scope was then withdrawn from the patient and the procedure completed.  COMPLICATIONS: There were no complications. ENDOSCOPIC IMPRESSION: Normal EGD  RECOMMENDATIONS: Continue antiacid medicine (dexilant) once daily. Continue to cut back on caffeine.   eSigned:  Milus Banister, MD 04/09/2014 2:04 PM

## 2014-04-09 NOTE — Patient Instructions (Signed)
CONTINUE TO CUT DOWN ON CAFFEINE INTAKE.  CONTINUE TAKING YOUR DEXILANT.  TAKE ONE IMMODIUM DAILY IN AM.  OUR OFFICE WILL CALL YOU WITH AN APPOINTMENT WITH A GENERAL SURGEON FOR YOUR HEMORRHOIDS.     YOU HAD AN ENDOSCOPIC PROCEDURE TODAY AT [facility]: Refer to the procedure report that was given to you for any specific questions about what was found during the examination.  If the procedure report does not answer your questions, please call your gastroenterologist to clarify.  If you requested that your care partner not be given the details of your procedure findings, then the procedure report has been included in a sealed envelope for you to review at your convenience later.  YOU SHOULD EXPECT: Some feelings of bloating in the abdomen. Passage of more gas than usual.  Walking can help get rid of the air that was put into your GI tract during the procedure and reduce the bloating. If you had a lower endoscopy (such as a colonoscopy or flexible sigmoidoscopy) you may notice spotting of blood in your stool or on the toilet paper. If you underwent a bowel prep for your procedure, then you may not have a normal bowel movement for a few days.  DIET: Your first meal following the procedure should be a light meal and then it is ok to progress to your normal diet.  A half-sandwich or bowl of soup is an example of a good first meal.  Heavy or fried foods are harder to digest and may make you feel nauseous or bloated.  Likewise meals heavy in dairy and vegetables can cause extra gas to form and this can also increase the bloating.  Drink plenty of fluids but you should avoid alcoholic beverages for 24 hours.  ACTIVITY: Your care partner should take you home directly after the procedure.  You should plan to take it easy, moving slowly for the rest of the day.  You can resume normal activity the day after the procedure however you should NOT DRIVE or use heavy machinery for 24 hours (because  of the sedation medicines used during the test).    SYMPTOMS TO REPORT IMMEDIATELY: A gastroenterologist can be reached at any hour.  During normal business hours, 8:30 AM to 5:00 PM Monday through Friday, call [phone].  After hours and on weekends, please call the GI answering service at [phone] who will take a message and have the physician on call contact you.   Following lower endoscopy (colonoscopy or flexible sigmoidoscopy):  Excessive amounts of blood in the stool  Significant tenderness or worsening of abdominal pains  Swelling of the abdomen that is new, acute  Fever of 100F or higher  Following upper endoscopy (EGD)  Vomiting of blood or coffee ground material  New chest pain or pain under the shoulder blades  Painful or persistently difficult swallowing  New shortness of breath  Fever of 100F or higher  Black, tarry-looking stools  FOLLOW UP: If any biopsies were taken you will be contacted by phone or by letter within the next 1-3 weeks.  Call your gastroenterologist if you have not heard about the biopsies in 3 weeks.  Our staff will call the home number listed on your records the next business day following your procedure to check on you and address any questions or concerns that you may have at that time regarding the information given to you following your procedure. This is a courtesy call and so if there is  no answer at the home number and we have not heard from you through the emergency physician on call, we will assume that you have returned to your regular daily activities without incident.  SIGNATURES/CONFIDENTIALITY: You and/or your care partner have signed paperwork which will be entered into your electronic medical record.  These signatures attest to the fact that that the information above on your After Visit Summary has been reviewed and is understood.  Full responsibility of the confidentiality of this discharge information lies with you and/or  your care-partner.

## 2014-04-09 NOTE — Progress Notes (Signed)
Procedure ends, to recovery, report given and VSS. 

## 2014-04-09 NOTE — Op Note (Addendum)
[place]  [address] [place], [address]   COLONOSCOPY PROCEDURE REPORT  PATIENT: [name], [name]  MR#: [id number] BIRTHDATE: 04-Feb-1971 , 42  yrs. old GENDER: Female ENDOSCOPIST: Milus Banister, MD PROCEDURE DATE:  04/09/2014 PROCEDURE:   Colonoscopy with biopsy First Screening Colonoscopy - Avg.  risk and is 50 yrs.  old or older - No.  Prior Negative Screening - Now for repeat screening. N/A  History of Adenoma - Now for follow-up colonoscopy & has been > or = to 3 yrs.  N/A  Polyps Removed Today? No.  Recommend repeat exam, <10 yrs? No. ASA CLASS:   Class II INDICATIONS:chronic loose stools, rectal bleeding. MEDICATIONS: MAC sedation, administered by CRNA and propofol (Diprivan) 200mg  IV  DESCRIPTION OF PROCEDURE:   After the risks benefits and alternatives of the procedure were thoroughly explained, informed consent was obtained.  A digital rectal exam revealed no abnormalities of the rectum.   The LB [id number]  endoscope was introduced through the anus and advanced to the terminal ileum which was intubated for a short distance. No adverse events experienced.   The quality of the prep was excellent.  The instrument was then slowly withdrawn as the colon was fully examined.   COLON FINDINGS: The terminal ileum was normal.  There were diverticulum throughout the colon.  The mucosa was otherwise normal and was biopsied randomly.  There were medium sized external hemorrhoids.  Retroflexed views revealed no abnormalities. The time to cecum=3 minutes 09 seconds.  Withdrawal time=6 minutes 51 seconds.  The scope was withdrawn and the procedure completed. COMPLICATIONS: There were no complications.  ENDOSCOPIC IMPRESSION: The terminal ileum was normal.  There were diverticulum throughout the colon.  The mucosa was otherwise normal and was biopsied randomly.  There were medium sized external hemorrhoids.  RECOMMENDATIONS: Screening  colonoscopy in 10 years. Await final pathology to check microscopic colitis. Continue cutting back on caffeine. Consider general surgery referral for hemorrhoids. For now, please start one imodium every morning shortly after waking up.   eSigned:  Milus Banister, MD 04/09/2014 2:05 PM Revised: 04/09/2014 2:05 PM

## 2014-04-09 NOTE — Progress Notes (Signed)
Called to room to assist during endoscopic procedure.  Patient ID and intended procedure confirmed with present staff. Received instructions for my participation in the procedure from the performing physician.  

## 2014-04-11 ENCOUNTER — Telehealth: Payer: Self-pay | Admitting: *Deleted

## 2014-04-11 NOTE — Telephone Encounter (Signed)
  Follow up Call-  Call back number 04/09/2014  Post procedure Call Back phone  # [phone] cell  Permission to leave phone message Yes     Patient questions:  Voice mail has not been set up yet.

## 2014-04-17 ENCOUNTER — Encounter: Payer: Self-pay | Admitting: Gastroenterology

## 2014-04-24 ENCOUNTER — Ambulatory Visit (INDEPENDENT_AMBULATORY_CARE_PROVIDER_SITE_OTHER): Payer: Medicare Other | Admitting: General Surgery

## 2014-04-24 ENCOUNTER — Encounter (INDEPENDENT_AMBULATORY_CARE_PROVIDER_SITE_OTHER): Payer: Self-pay | Admitting: General Surgery

## 2014-04-24 VITALS — BP 132/86 | HR 60 | Temp 98.3°F | Resp 14 | Ht 60.0 in | Wt 174.8 lb

## 2014-04-24 DIAGNOSIS — K644 Residual hemorrhoidal skin tags: Secondary | ICD-10-CM

## 2014-04-24 DIAGNOSIS — K648 Other hemorrhoids: Secondary | ICD-10-CM

## 2014-04-24 NOTE — Progress Notes (Signed)
[name], female   DOB: 04/25/1971, 43 y.o.   MRN: [id number]  Chief Complaint  Patient presents with  . Routine Post Op    hemorrhoids    HPI [name] is a 43 y.o. female.  The patient is a 43 year old female was referred by Dr. Ardis Hughs for evaluation of hemorrhoids. Patient has history of IBS mainly associated with diarrhea. She states she hasn't bleeding. She does have some pain as well. She does state that she frequently uses a patent secondary to diarrhea.  Patient has had a colonoscopy which did show some internal and external hemorrhoids. Patient states she does not fiber at this time. She states that after her cholecystectomy her barrier seemed to worsen.  HPI  Past Medical History  Diagnosis Date  . GERD (gastroesophageal reflux disease)   . IBS (irritable bowel syndrome)   . Anxiety   . Depression   . Arthritis     left knee  . Cancer     melanoma on back  . Substance abuse     Past Surgical History  Procedure Laterality Date  . Knee arthroscopy  2011    left  . Wisdom tooth extraction    . Hysteroscopy w/d&c  06/22/2012    Procedure: DILATATION AND CURETTAGE /HYSTEROSCOPY;  Surgeon: Lahoma Crocker, MD;  Location: [facility];  Service: Gynecology;  Laterality: N/A;  . Dilation and curettage of uterus    . Cholecystectomy  08/02/2012    Procedure: LAPAROSCOPIC CHOLECYSTECTOMY WITH INTRAOPERATIVE CHOLANGIOGRAM;  Surgeon: Rolm Bookbinder, MD;  Location: [facility];  Service: General;  Laterality: N/A;    Family History  Problem Relation Age of Onset  . Heart disease Father   . Stroke Father   . Cancer Maternal Grandfather     colon  . Colon cancer Maternal Grandfather   . Cancer Paternal Grandmother     stomach  . Stomach cancer Paternal Grandmother   . Esophageal cancer Neg Hx   . Pancreatic cancer Neg Hx   . Rectal cancer Neg Hx   . Cancer Mother     ovarian  . Cancer Sister     ovARIAN    Social History History  Substance Use  Topics  . Smoking status: Current Every Day Smoker -- 1.00 packs/day for 25 years    Types: Cigarettes  . Smokeless tobacco: Never Used  . Alcohol Use: No    Allergies  Allergen Reactions  . Codeine Nausea And Vomiting    "Pt can take Vicodin if given with promethazine"    Current Outpatient Prescriptions  Medication Sig Dispense Refill  . clonazePAM (KLONOPIN) 0.5 MG tablet Take 0.5 mg by mouth 2 (two) times daily as needed for anxiety.      [name] dexlansoprazole (DEXILANT) 60 MG capsule Take 60 mg by mouth daily.      [name] escitalopram (LEXAPRO) 10 MG tablet Take 10 mg by mouth daily.       No current facility-administered medications for this visit.    Review of Systems Review of Systems  Constitutional: Negative.   HENT: Negative.   Respiratory: Negative.   Cardiovascular: Negative.   Gastrointestinal: Positive for anal bleeding.  Neurological: Negative.   All other systems reviewed and are negative.   Blood pressure 132/86, pulse 60, temperature 98.3 F (36.8 C), temperature source Oral, resp. rate 14, height 5' (1.524 m), weight 174 lb 12.8 oz (79.289 kg).  Physical Exam Physical Exam  Constitutional: She is oriented to person, place, and  time. She appears well-developed and well-nourished.  HENT:  Head: Normocephalic and atraumatic.  Eyes: Conjunctivae and EOM are normal. Pupils are equal, round, and reactive to light.  Neck: Normal range of motion. Neck supple.  Cardiovascular: Normal rate, regular rhythm and normal heart sounds.   Pulmonary/Chest: Effort normal and breath sounds normal.  Genitourinary:     Musculoskeletal: Normal range of motion.  Neurological: She is alert and oriented to person, place, and time.  Skin: Skin is warm and dry.  Psychiatric: She has a normal mood and affect.    Data Reviewed Colonoscopy as above  Assessment    43 year old female with external and internal hemorrhoids     Plan    1. We proceeded with date of internal  hemorrhoid times one at the 4:00 position. The patient tolerated this procedure well. 2. He did have a long discussion with her the pathophysiology of hemorrhoids, and the need to take fiber. I believe this will help with her diarrhea. 3. I did discuss with excision of external hemorrhoids in the pain is involved. She will think about at this time calls back what she wants to proceed with excision of external hemorrhoids.        Rosario Jacks., Gianny Killman 04/24/2014, 10:15 AM

## 2014-10-27 ENCOUNTER — Other Ambulatory Visit: Payer: Self-pay | Admitting: Orthopedic Surgery

## 2014-10-28 DIAGNOSIS — S92309K Fracture of unspecified metatarsal bone(s), unspecified foot, subsequent encounter for fracture with nonunion: Secondary | ICD-10-CM

## 2014-10-28 HISTORY — DX: Fracture of unspecified metatarsal bone(s), unspecified foot, subsequent encounter for fracture with nonunion: S92.309K

## 2014-10-31 ENCOUNTER — Encounter (HOSPITAL_BASED_OUTPATIENT_CLINIC_OR_DEPARTMENT_OTHER): Payer: Self-pay | Admitting: *Deleted

## 2014-11-05 ENCOUNTER — Ambulatory Visit (HOSPITAL_BASED_OUTPATIENT_CLINIC_OR_DEPARTMENT_OTHER): Payer: Medicare Other | Admitting: Certified Registered"

## 2014-11-05 ENCOUNTER — Ambulatory Visit (HOSPITAL_BASED_OUTPATIENT_CLINIC_OR_DEPARTMENT_OTHER)
Admission: RE | Admit: 2014-11-05 | Discharge: 2014-11-05 | Disposition: A | Discharge status: Home / Self Care | Payer: Medicare Other | Source: Ambulatory Visit | Attending: Orthopedic Surgery | Admitting: Orthopedic Surgery

## 2014-11-05 ENCOUNTER — Encounter (HOSPITAL_BASED_OUTPATIENT_CLINIC_OR_DEPARTMENT_OTHER)
Admission: RE | Disposition: A | Discharge status: Home / Self Care | Payer: Self-pay | Source: Ambulatory Visit | Attending: Orthopedic Surgery

## 2014-11-05 ENCOUNTER — Encounter (HOSPITAL_BASED_OUTPATIENT_CLINIC_OR_DEPARTMENT_OTHER): Payer: Self-pay | Admitting: Certified Registered"

## 2014-11-05 DIAGNOSIS — Y838 Other surgical procedures as the cause of abnormal reaction of the patient, or of later complication, without mention of misadventure at the time of the procedure: Secondary | ICD-10-CM | POA: Diagnosis not present

## 2014-11-05 DIAGNOSIS — F17210 Nicotine dependence, cigarettes, uncomplicated: Secondary | ICD-10-CM | POA: Insufficient documentation

## 2014-11-05 DIAGNOSIS — Z6835 Body mass index (BMI) 35.0-35.9, adult: Secondary | ICD-10-CM | POA: Insufficient documentation

## 2014-11-05 DIAGNOSIS — F419 Anxiety disorder, unspecified: Secondary | ICD-10-CM | POA: Diagnosis not present

## 2014-11-05 DIAGNOSIS — Z9049 Acquired absence of other specified parts of digestive tract: Secondary | ICD-10-CM | POA: Insufficient documentation

## 2014-11-05 DIAGNOSIS — S92352K Displaced fracture of fifth metatarsal bone, left foot, subsequent encounter for fracture with nonunion: Secondary | ICD-10-CM | POA: Insufficient documentation

## 2014-11-05 DIAGNOSIS — K219 Gastro-esophageal reflux disease without esophagitis: Secondary | ICD-10-CM | POA: Diagnosis not present

## 2014-11-05 DIAGNOSIS — F329 Major depressive disorder, single episode, unspecified: Secondary | ICD-10-CM | POA: Diagnosis not present

## 2014-11-05 HISTORY — DX: Fracture of unspecified metatarsal bone(s), unspecified foot, subsequent encounter for fracture with nonunion: S92.309K

## 2014-11-05 HISTORY — PX: ORIF TOE FRACTURE: SHX5032

## 2014-11-05 HISTORY — DX: Ulcer of esophagus without bleeding: K22.10

## 2014-11-05 HISTORY — DX: Diverticulosis of intestine, part unspecified, without perforation or abscess without bleeding: K57.90

## 2014-11-05 LAB — POCT HEMOGLOBIN-HEMACUE: HEMOGLOBIN: 15.4 g/dL — AB (ref 12.0–15.0)

## 2014-11-05 SURGERY — OPEN REDUCTION INTERNAL FIXATION (ORIF) METATARSAL (TOE) FRACTURE
Anesthesia: General | Site: Foot | Laterality: Left

## 2014-11-05 MED ORDER — LACTATED RINGERS IV SOLN
INTRAVENOUS | Status: DC
  Administered 2014-11-05 (×2): via INTRAVENOUS

## 2014-11-05 MED ORDER — NICOTINE 14 MG/24HR TD PT24: 14.0000 mg | MEDICATED_PATCH | Freq: Every day | TRANSDERMAL | Status: DC

## 2014-11-05 MED ORDER — ONDANSETRON HCL 4 MG/2ML IJ SOLN
INTRAMUSCULAR | Status: DC | PRN
  Administered 2014-11-05: 4 mg via INTRAVENOUS

## 2014-11-05 MED ORDER — CHLORHEXIDINE GLUCONATE 4 % EX LIQD: 60.0000 mL | Freq: Once | CUTANEOUS | Status: DC

## 2014-11-05 MED ORDER — OXYCODONE HCL 5 MG PO TABS: 5.0000 mg | ORAL_TABLET | Freq: Once | ORAL | Status: DC | PRN

## 2014-11-05 MED ORDER — CEFAZOLIN SODIUM 1-5 GM-% IV SOLN
INTRAVENOUS | Status: AC
  Filled 2014-11-05: qty 50

## 2014-11-05 MED ORDER — PROPOFOL 10 MG/ML IV EMUL
INTRAVENOUS | Status: AC
  Filled 2014-11-05: qty 50

## 2014-11-05 MED ORDER — FENTANYL CITRATE 0.05 MG/ML IJ SOLN
INTRAMUSCULAR | Status: AC
  Filled 2014-11-05: qty 6

## 2014-11-05 MED ORDER — MIDAZOLAM HCL 2 MG/2ML IJ SOLN
1.0000 mg | INTRAMUSCULAR | Status: DC | PRN
  Administered 2014-11-05: 2 mg via INTRAVENOUS

## 2014-11-05 MED ORDER — FENTANYL CITRATE 0.05 MG/ML IJ SOLN
INTRAMUSCULAR | Status: DC | PRN
  Administered 2014-11-05 (×2): 25 ug via INTRAVENOUS
  Administered 2014-11-05: 50 ug via INTRAVENOUS

## 2014-11-05 MED ORDER — BUPIVACAINE-EPINEPHRINE (PF) 0.5% -1:200000 IJ SOLN
INTRAMUSCULAR | Status: AC
  Filled 2014-11-05: qty 30

## 2014-11-05 MED ORDER — MIDAZOLAM HCL 2 MG/2ML IJ SOLN
INTRAMUSCULAR | Status: AC
  Filled 2014-11-05: qty 2

## 2014-11-05 MED ORDER — OXYCODONE-ACETAMINOPHEN 5-325 MG PO TABS: 1.0000 | ORAL_TABLET | Freq: Four times a day (QID) | ORAL | Status: DC | PRN

## 2014-11-05 MED ORDER — MIDAZOLAM HCL 2 MG/ML PO SYRP: 12.0000 mg | ORAL_SOLUTION | Freq: Once | ORAL | Status: DC | PRN

## 2014-11-05 MED ORDER — FENTANYL CITRATE 0.05 MG/ML IJ SOLN
INTRAMUSCULAR | Status: AC
  Filled 2014-11-05: qty 2

## 2014-11-05 MED ORDER — BUPIVACAINE HCL (PF) 0.5 % IJ SOLN
INTRAMUSCULAR | Status: AC
  Filled 2014-11-05: qty 30

## 2014-11-05 MED ORDER — CEFAZOLIN SODIUM 1-5 GM-% IV SOLN
1.0000 g | Freq: Once | INTRAVENOUS | Status: AC
  Administered 2014-11-05: 1 g via INTRAVENOUS

## 2014-11-05 MED ORDER — PROPOFOL 10 MG/ML IV BOLUS
INTRAVENOUS | Status: DC | PRN
  Administered 2014-11-05: 200 mg via INTRAVENOUS

## 2014-11-05 MED ORDER — ONDANSETRON HCL 4 MG/2ML IJ SOLN: 4.0000 mg | Freq: Once | INTRAMUSCULAR | Status: DC | PRN

## 2014-11-05 MED ORDER — HYDROMORPHONE HCL 1 MG/ML IJ SOLN: 0.2500 mg | INTRAMUSCULAR | Status: DC | PRN

## 2014-11-05 MED ORDER — OXYCODONE HCL 5 MG/5ML PO SOLN: 5.0000 mg | Freq: Once | ORAL | Status: DC | PRN

## 2014-11-05 MED ORDER — MIDAZOLAM HCL 5 MG/5ML IJ SOLN
INTRAMUSCULAR | Status: DC | PRN
  Administered 2014-11-05 (×2): 1 mg via INTRAVENOUS

## 2014-11-05 MED ORDER — BUPIVACAINE-EPINEPHRINE (PF) 0.5% -1:200000 IJ SOLN
INTRAMUSCULAR | Status: DC | PRN
  Administered 2014-11-05: 25 mL via PERINEURAL

## 2014-11-05 MED ORDER — CEFAZOLIN SODIUM-DEXTROSE 2-3 GM-% IV SOLR
2.0000 g | INTRAVENOUS | Status: AC
  Administered 2014-11-05: 2 g via INTRAVENOUS

## 2014-11-05 MED ORDER — FENTANYL CITRATE 0.05 MG/ML IJ SOLN
50.0000 ug | INTRAMUSCULAR | Status: DC | PRN
  Administered 2014-11-05: 100 ug via INTRAVENOUS

## 2014-11-05 MED ORDER — PROMETHAZINE HCL 25 MG PO TABS: 25.0000 mg | ORAL_TABLET | Freq: Four times a day (QID) | ORAL | Status: DC | PRN

## 2014-11-05 MED ORDER — CEFAZOLIN SODIUM-DEXTROSE 2-3 GM-% IV SOLR
INTRAVENOUS | Status: AC
  Filled 2014-11-05: qty 50

## 2014-11-05 MED ORDER — FENTANYL CITRATE 0.05 MG/ML IJ SOLN: INTRAMUSCULAR | Status: DC | PRN

## 2014-11-05 MED ORDER — LIDOCAINE HCL (CARDIAC) 20 MG/ML IV SOLN
INTRAVENOUS | Status: DC | PRN
  Administered 2014-11-05: 80 mg via INTRAVENOUS

## 2014-11-05 MED ORDER — DEXAMETHASONE SODIUM PHOSPHATE 10 MG/ML IJ SOLN
INTRAMUSCULAR | Status: DC | PRN
  Administered 2014-11-05: 10 mg via INTRAVENOUS

## 2014-11-05 MED ORDER — 0.9 % SODIUM CHLORIDE (POUR BTL) OPTIME
TOPICAL | Status: DC | PRN
  Administered 2014-11-05: 120 mL

## 2014-11-05 SURGICAL SUPPLY — 83 items
BANDAGE COBAN STERILE 2 (GAUZE/BANDAGES/DRESSINGS) IMPLANT
BANDAGE ELASTIC 3 VELCRO ST LF (GAUZE/BANDAGES/DRESSINGS) IMPLANT
BANDAGE ELASTIC 4 VELCRO ST LF (GAUZE/BANDAGES/DRESSINGS) ×6 IMPLANT
BANDAGE ELASTIC 6 VELCRO ST LF (GAUZE/BANDAGES/DRESSINGS) IMPLANT
BANDAGE ESMARK 6X9 LF (GAUZE/BANDAGES/DRESSINGS) IMPLANT
BIT DRILL 1.7 (BIT) ×3 IMPLANT
BIT DRILL 1.7 LOW PROFILE (BIT) ×3 IMPLANT
BIT DRILL 11/64XX180123XX4 (BIT) ×1
BIT DRILL 11/64XX180123XX4.3 (BIT) ×1 IMPLANT
BLADE SURG 15 STRL LF DISP TIS (BLADE) ×2 IMPLANT
BLADE SURG 15 STRL SS (BLADE) ×4
BNDG ESMARK 4X9 LF (GAUZE/BANDAGES/DRESSINGS) ×3 IMPLANT
BNDG ESMARK 6X9 LF (GAUZE/BANDAGES/DRESSINGS)
BNDG GAUZE 1X2.1 STRL (MISCELLANEOUS) IMPLANT
CANISTER SUCT 1200ML W/VALVE (MISCELLANEOUS) IMPLANT
COVER BACK TABLE 60X90IN (DRAPES) ×3 IMPLANT
COVER MAYO STAND STRL (DRAPES) ×3 IMPLANT
CUFF TOURNIQUET SINGLE 24IN (TOURNIQUET CUFF) ×3 IMPLANT
DECANTER SPIKE VIAL GLASS SM (MISCELLANEOUS) IMPLANT
DRAPE EXTREMITY T 121X128X90 (DRAPE) ×3 IMPLANT
DRAPE OEC MINIVIEW 54X84 (DRAPES) ×3 IMPLANT
DRAPE U 20/CS (DRAPES) ×3 IMPLANT
DRAPE U-SHAPE 47X51 STRL (DRAPES) ×3 IMPLANT
DRILL BIT 11/64XX180123XX4.3 (BIT) ×2
DURAPREP 26ML APPLICATOR (WOUND CARE) ×3 IMPLANT
ELECT NEEDLE TIP 2.8 STRL (NEEDLE) IMPLANT
ELECT REM PT RETURN 9FT ADLT (ELECTROSURGICAL) ×3
ELECTRODE REM PT RTRN 9FT ADLT (ELECTROSURGICAL) ×1 IMPLANT
GAUZE SPONGE 4X4 12PLY STRL (GAUZE/BANDAGES/DRESSINGS) ×3 IMPLANT
GAUZE XEROFORM 1X8 LF (GAUZE/BANDAGES/DRESSINGS) ×3 IMPLANT
GLOVE BIO SURGEON STRL SZ7.5 (GLOVE) ×3 IMPLANT
GLOVE BIOGEL PI IND STRL 7.0 (GLOVE) ×3 IMPLANT
GLOVE BIOGEL PI IND STRL 8 (GLOVE) ×2 IMPLANT
GLOVE BIOGEL PI INDICATOR 7.0 (GLOVE) ×6
GLOVE BIOGEL PI INDICATOR 8 (GLOVE) ×4
GLOVE ECLIPSE 6.5 STRL STRAW (GLOVE) ×9 IMPLANT
GLOVE ECLIPSE 7.5 STRL STRAW (GLOVE) ×6 IMPLANT
GLOVE EXAM NITRILE LRG STRL (GLOVE) ×3 IMPLANT
GOWN STRL REUS W/ TWL LRG LVL3 (GOWN DISPOSABLE) ×3 IMPLANT
GOWN STRL REUS W/ TWL XL LVL3 (GOWN DISPOSABLE) ×1 IMPLANT
GOWN STRL REUS W/TWL LRG LVL3 (GOWN DISPOSABLE) ×6
GOWN STRL REUS W/TWL XL LVL3 (GOWN DISPOSABLE) ×5 IMPLANT
GUIDEWIRE 1.6 (WIRE) ×2
GUIDEWIRE ORTH 157X1.6XTROC (WIRE) ×1 IMPLANT
NEEDLE HYPO 25X1 1.5 SAFETY (NEEDLE) IMPLANT
NS IRRIG 1000ML POUR BTL (IV SOLUTION) ×3 IMPLANT
PACK BASIN DAY SURGERY FS (CUSTOM PROCEDURE TRAY) ×3 IMPLANT
PAD CAST 3X4 CTTN HI CHSV (CAST SUPPLIES) ×2 IMPLANT
PAD CAST 4YDX4 CTTN HI CHSV (CAST SUPPLIES) ×1 IMPLANT
PADDING CAST ABS 4INX4YD NS (CAST SUPPLIES) ×2
PADDING CAST ABS COTTON 4X4 ST (CAST SUPPLIES) ×1 IMPLANT
PADDING CAST COTTON 3X4 STRL (CAST SUPPLIES) ×4
PADDING CAST COTTON 4X4 STRL (CAST SUPPLIES) ×2
PADDING CAST COTTON 6X4 STRL (CAST SUPPLIES) IMPLANT
PENCIL BUTTON HOLSTER BLD 10FT (ELECTRODE) ×3 IMPLANT
PLATE 5TH METATARSAL HOOK (Plate) ×3 IMPLANT
SCREW CORTICAL 2.4X14 (Screw) ×3 IMPLANT
SCREW LP CORTEX 2.4X10 (Screw) ×3 IMPLANT
SCREW LP CORTEX 2.4X12 (Screw) ×6 IMPLANT
SCREW VAL 2.4 X 14 MM ×3 IMPLANT
SCREW VAL TI 2.4X18 (Screw) ×3 IMPLANT
SHEET MEDIUM DRAPE 40X70 STRL (DRAPES) IMPLANT
SPLINT FIBERGLASS 4X30 (CAST SUPPLIES) ×3 IMPLANT
SPLINT PLASTER CAST XFAST 4X15 (CAST SUPPLIES) IMPLANT
SPLINT PLASTER XTRA FAST SET 4 (CAST SUPPLIES)
SPONGE GAUZE 2X2 8PLY STER LF (GAUZE/BANDAGES/DRESSINGS) ×1
SPONGE GAUZE 2X2 8PLY STRL LF (GAUZE/BANDAGES/DRESSINGS) ×2 IMPLANT
STOCKINETTE 4X48 STRL (DRAPES) IMPLANT
STOCKINETTE 6  STRL (DRAPES)
STOCKINETTE 6 STRL (DRAPES) IMPLANT
SUCTION FRAZIER TIP 10 FR DISP (SUCTIONS) IMPLANT
SUT ETHIBOND 3-0 V-5 (SUTURE) IMPLANT
SUT ETHILON 3 0 PS 1 (SUTURE) ×3 IMPLANT
SUT ETHILON 4 0 PS 2 18 (SUTURE) IMPLANT
SUT VIC AB 4-0 P-3 18XBRD (SUTURE) ×1 IMPLANT
SUT VIC AB 4-0 P3 18 (SUTURE) ×2
SYR BULB 3OZ (MISCELLANEOUS) ×3 IMPLANT
SYR CONTROL 10ML LL (SYRINGE) IMPLANT
TOWEL OR 17X24 6PK STRL BLUE (TOWEL DISPOSABLE) ×3 IMPLANT
TOWEL OR NON WOVEN STRL DISP B (DISPOSABLE) ×3 IMPLANT
TUBE CONNECTING 20'X1/4 (TUBING)
TUBE CONNECTING 20X1/4 (TUBING) IMPLANT
UNDERPAD 30X30 INCONTINENT (UNDERPADS AND DIAPERS) ×3 IMPLANT

## 2014-11-05 NOTE — Brief Op Note (Signed)
11/05/2014  1:27 PM  PATIENT:  [name]  44 y.o. female  PRE-OPERATIVE DIAGNOSIS:  NONUNION 5TH METATARSAL FRACTURE LEFT  POST-OPERATIVE DIAGNOSIS:  NONUNION 5TH METATARSAL FRACTURE LEFT  PROCEDURE:  Procedure(s): OPEN REDUCTION INTERNAL FIXATION (ORIF) LEFT FIFTH METATARSAL (TOE) FRACTURE (Left)  SURGEON:  Surgeon(s) and Role:    * Dorna Leitz, MD - Primary  PHYSICIAN ASSISTANT:   ASSISTANTS: bethune   ANESTHESIA:   general  EBL:  Total I/O In: 300 [I.V.:300] Out: -   BLOOD ADMINISTERED:none  DRAINS: none   LOCAL MEDICATIONS USED:  NONE  SPECIMEN:  No Specimen  DISPOSITION OF SPECIMEN:  N/A  COUNTS:  YES  TOURNIQUET:  * Missing tourniquet times found for documented tourniquets in log:  [id number] *  DICTATION: .Other Dictation: Dictation Number [id number]  PLAN OF CARE: Discharge to home after PACU  PATIENT DISPOSITION:  PACU - hemodynamically stable.   Delay start of Pharmacological VTE agent (>24hrs) due to surgical blood loss or risk of bleeding: no

## 2014-11-05 NOTE — H&P (Signed)
PREOPERATIVE H&P  Chief Complaint: l foot pain  HPI: [name] is a 44 y.o. female who presents for evaluation of l foot pain. It has been present for 5 months and has been worsening. She has failed conservative measures. Pain is rated as moderate.  Past Medical History  Diagnosis Date  . GERD (gastroesophageal reflux disease)   . IBS (irritable bowel syndrome)     no current med.  . Anxiety   . Depression   . Arthritis     left knee  . Erosive esophagitis   . Diverticulosis   . 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 Knee arthroscopy  2011    left  . Wisdom tooth extraction    . Hysteroscopy w/d&c  06/22/2012    Procedure: DILATATION AND CURETTAGE /HYSTEROSCOPY;  Surgeon: Lahoma Crocker, MD;  Location: [facility];  Service: Gynecology;  Laterality: N/A;  . Cholecystectomy  08/02/2012    Procedure: LAPAROSCOPIC CHOLECYSTECTOMY WITH INTRAOPERATIVE CHOLANGIOGRAM;  Surgeon: Rolm Bookbinder, MD;  Location: [facility];  Service: General;  Laterality: N/A;  . Colonoscopy with propofol  04/09/2014  . Esophagogastroduodenoscopy (egd) with propofol  04/09/2014   History   Social History  . Marital Status: Single    Spouse Name: N/A  . Number of Children: N/A  . Years of Education: N/A   Social History Main Topics  . Smoking status: Current Every Day Smoker -- 0.50 packs/day for 28 years    Types: Cigarettes  . Smokeless tobacco: Never Used  . Alcohol Use: No  . Drug Use: No  . Sexual Activity: Yes    Birth Control/ Protection: None   Other Topics Concern  . None   Social History Narrative   Family History  Problem Relation Age of Onset  . Heart disease Father   . Stroke Father   . Cancer - Colon Maternal Grandfather   . Colon cancer Maternal Grandfather   . Cancer Paternal Grandmother     stomach  . Stomach cancer Paternal Grandmother   . Cancer - Ovarian Mother   . Cancer -  Ovarian Sister    No Known Allergies Prior to Admission medications   Medication Sig Start Date End Date Taking? Authorizing Provider  butalbital-acetaminophen-caffeine (FIORICET, ESGIC) 50-325-40 MG per tablet Take by mouth 2 (two) times daily as needed for headache.   Yes Historical Provider, MD  clonazePAM (KLONOPIN) 0.5 MG tablet Take 1 mg by mouth 2 (two) times daily as needed for anxiety.    Yes Historical Provider, MD  dexlansoprazole (DEXILANT) 60 MG capsule Take 60 mg by mouth daily.   Yes Historical Provider, MD  escitalopram (LEXAPRO) 10 MG tablet Take 20 mg by mouth daily.    Yes Historical Provider, MD  HYDROcodone-acetaminophen (NORCO/VICODIN) 5-325 MG per tablet Take 1 tablet by mouth every 6 (six) hours as needed for moderate pain.   Yes Historical Provider, MD  promethazine (PHENERGAN) 25 MG tablet Take 25 mg by mouth every 6 (six) hours as needed for nausea or vomiting.   Yes Historical Provider, MD     Positive ROS: none  All other systems have been reviewed and were otherwise negative with the exception of those mentioned in the HPI and as above.  Physical Exam: There were no vitals filed for this visit.  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  Predicted outcome is good, although there will be at least a six to nine month expected recovery.  Kyani Simkin L, MD 11/05/2014 10:48 AM

## 2014-11-05 NOTE — Transfer of Care (Signed)
Immediate Anesthesia Transfer of Care Note  Patient: [name]  Procedure(s) Performed: Procedure(s): OPEN REDUCTION INTERNAL FIXATION (ORIF) LEFT FIFTH METATARSAL (TOE) FRACTURE WITH CALCANEAL BONE GRAFT (Left)  Patient Location: PACU  Anesthesia Type:GA combined with regional for post-op pain  Level of Consciousness: awake, sedated and patient cooperative  Airway & Oxygen Therapy: Patient Spontanous Breathing and Patient connected to face mask oxygen  Post-op Assessment: Report given to RN and Post -op Vital signs reviewed and stable  Post vital signs: Reviewed and stable  Last Vitals:  Filed Vitals:   11/05/14 1145  BP: 127/68  Pulse: 88  Temp:   Resp: 18    Complications: No apparent anesthesia complications

## 2014-11-05 NOTE — Progress Notes (Signed)
Assisted Dr. Al Corpus with left, ultrasound guided, popliteal block. Side rails up, monitors on throughout procedure. See vital signs in flow sheet. Tolerated Procedure well.

## 2014-11-05 NOTE — Discharge Instructions (Signed)
Elevate your left leg as much as possible. Apply ice 72 hours. Ambulate nonweightbearing on the left lower extremity. No smoking!!   Post Anesthesia Home Care Instructions  Activity: Get plenty of rest for the remainder of the day. A responsible adult should stay with you for 24 hours following the procedure.  For the next 24 hours, DO NOT: -Drive a car -Paediatric nurse -Drink alcoholic beverages -Take any medication unless instructed by your physician -Make any legal decisions or sign important papers.  Meals: Start with liquid foods such as gelatin or soup. Progress to regular foods as tolerated. Avoid greasy, spicy, heavy foods. If nausea and/or vomiting occur, drink only clear liquids until the nausea and/or vomiting subsides. Call your physician if vomiting continues.  Special Instructions/Symptoms: Your throat may feel dry or sore from the anesthesia or the breathing tube placed in your throat during surgery. If this causes discomfort, gargle with warm salt water. The discomfort should disappear within 24 hours. Regional Anesthesia Blocks  1. Numbness or the inability to move the "blocked" extremity may last from 3-48 hours after placement. The length of time depends on the medication injected and your individual response to the medication. If the numbness is not going away after 48 hours, call your surgeon.  2. The extremity that is blocked will need to be protected until the numbness is gone and the  Strength has returned. Because you cannot feel it, you will need to take extra care to avoid injury. Because it may be weak, you may have difficulty moving it or using it. You may not know what position it is in without looking at it while the block is in effect.  3. For blocks in the legs and feet, returning to weight bearing and walking needs to be done carefully. You will need to wait until the numbness is entirely gone and the strength has returned. You should be able to move  your leg and foot normally before you try and bear weight or walk. You will need someone to be with you when you first try to ensure you do not fall and possibly risk injury.  4. Bruising and tenderness at the needle site are common side effects and will resolve in a few days.  5. Persistent numbness or new problems with movement should be communicated to the surgeon or the [facility] [phone] [place] [phone]).

## 2014-11-05 NOTE — Anesthesia Procedure Notes (Addendum)
Procedure Name: LMA Insertion Date/Time: 11/05/2014 11:58 AM Performed by: Baxter Flattery Pre-anesthesia Checklist: Patient identified, Emergency Drugs available, Suction available and Patient being monitored Patient Re-evaluated:Patient Re-evaluated prior to inductionOxygen Delivery Method: Circle System Utilized Preoxygenation: Pre-oxygenation with 100% oxygen Intubation Type: IV induction Ventilation: Mask ventilation without difficulty LMA: LMA inserted LMA Size: 4.0 Number of attempts: 1 Airway Equipment and Method: Bite block Placement Confirmation: positive ETCO2 and breath sounds checked- equal and bilateral Tube secured with: Tape Dental Injury: Teeth and Oropharynx as per pre-operative assessment    Anesthesia Regional Block:  Popliteal block  Pre-Anesthetic Checklist: ,, timeout performed, Correct Patient, Correct Site, Correct Laterality, Correct Procedure, Correct Position, site marked, Risks and benefits discussed,  Surgical consent,  Pre-op evaluation,  At surgeon's request and post-op pain management  Laterality: Left and Lower  Prep: chloraprep       Needles:  Injection technique: Single-shot  Needle Type: Echogenic Needle     Needle Length: 9cm 9 cm Needle Gauge: 21 and 21 G    Additional Needles:  Procedures: ultrasound guided (picture in chart) Popliteal block Narrative:  Start time: 11/05/2014 11:40 AM End time: 11/05/2014 11:46 AM Injection made incrementally with aspirations every 5 mL.  Performed by: Personally  Anesthesiologist: Eugene Isadore

## 2014-11-05 NOTE — Anesthesia Preprocedure Evaluation (Signed)
Anesthesia Evaluation  Patient identified by MRN, date of birth, ID band Patient awake    Reviewed: Allergy & Precautions, NPO status , Patient's Chart, lab work & pertinent test results  Airway Mallampati: I  TM Distance: >3 FB Neck ROM: Full    Dental  (+) Teeth Intact, Dental Advisory Given   Pulmonary Current Smoker,  breath sounds clear to auscultation        Cardiovascular Rhythm:Regular Rate:Normal     Neuro/Psych    GI/Hepatic GERD-  Medicated and Controlled,  Endo/Other  Morbid obesity  Renal/GU      Musculoskeletal   Abdominal   Peds  Hematology   Anesthesia Other Findings   Reproductive/Obstetrics                             Anesthesia Physical Anesthesia Plan  ASA: II  Anesthesia Plan: General   Post-op Pain Management: MAC Combined w/ Regional for Post-op pain   Induction: Intravenous  Airway Management Planned: LMA  Additional Equipment:   Intra-op Plan:   Post-operative Plan: Extubation in OR  Informed Consent: I have reviewed the patients History and Physical, chart, labs and discussed the procedure including the risks, benefits and alternatives for the proposed anesthesia with the patient or authorized representative who has indicated his/her understanding and acceptance.   Dental advisory given  Plan Discussed with: CRNA, Anesthesiologist and Surgeon  Anesthesia Plan Comments:         Anesthesia Quick Evaluation

## 2014-11-06 NOTE — Anesthesia Postprocedure Evaluation (Signed)
  Anesthesia Post-op Note  Patient: [name]  Procedure(s) Performed: Procedure(s): OPEN REDUCTION INTERNAL FIXATION (ORIF) LEFT FIFTH METATARSAL (TOE) FRACTURE WITH CALCANEAL BONE GRAFT (Left)  Patient Location: PACU  Anesthesia Type: General with regional block for post op pain   Level of Consciousness: awake, alert  and oriented  Airway and Oxygen Therapy: Patient Spontanous Breathing  Post-op Pain: none  Post-op Assessment: Post-op Vital signs reviewed  Post-op Vital Signs: Reviewed  Last Vitals:  Filed Vitals:   11/05/14 1430  BP: 141/81  Pulse: 91  Temp: 36.7 C  Resp: 18    Complications: No apparent anesthesia complications

## 2014-11-06 NOTE — Op Note (Signed)
NAMEJERUSHA, REISING              ACCOUNT NO.:  [id number]  MEDICAL RECORD NO.:  [id number]  LOCATION:                                 FACILITY:  PHYSICIAN:  Alta Corning, M.D.   DATE OF BIRTH:  Aug 10, 1971  DATE OF PROCEDURE:  11/05/2014 DATE OF DISCHARGE:  11/05/2014                              OPERATIVE REPORT   She is a 44 year old female.  PREOPERATIVE DIAGNOSIS:  Nonunion fifth metatarsal fracture base.  POSTOPERATIVE DIAGNOSIS:  Nonunion fifth metatarsal fracture base.  PROCEDURE:  Open reduction, internal fixation of nonunion fifth metatarsal base with calcaneal bone grafting and a hook style plate.  SURGEON:  Alta Corning, M.D.  Terrence DupontModena Slater.  ANESTHESIA:  General.  BRIEF HISTORY:  Ms. Ryer is a 44 year old female with a history of having significant complaints of base of fifth metatarsal fracture.  We had treated this conservatively for 5 months.  She was continued to complain of pain, inability to bear weight without pain, and after failure of all conservative care, she was taken to the operating room for open reduction and internal fixation.  We felt that the piece was so small proximally that we would not able to put a screw down in this area, so we felt that a plate construct would be appropriate.  She was brought to the operating room for this procedure.  DESCRIPTION OF PROCEDURE:  The patient was brought to the operating room.  After adequate anesthesia was obtained with general anesthetic, the patient was placed supine on the operating table.  The left leg was prepped and draped in usual sterile fashion.  Following this, the leg was exsanguinated.  Blood pressure tourniquet was inflated to 250 mmHg. Following this, an incision was made over the fifth metatarsal, subcutaneous tissue down to the level of fifth metatarsal.  The shaft fracture was identified.  We then took down the nonunion, drilled both sides of the nonunion and then put a hook  plate in place.  I got a compressive hold across the fracture and then put the burr hole plate in place with compressive technique.  Once we did this, there was a little bit of a gap posteriorly although we had great compression in the anterior portion and we took at that point, we felt that probably a little graft in this area would be appropriate.  We went back to the calcaneus and exposed the posterior tuberosity and calcaneus of the space.  We then took a 1.8 drill bit and drilled a small hole and then curetted around this area for some cancellous bone grafting.  Once that bone grafting had been harvested, the wound was irrigated and closed in layers and went back to the wound where we had exposed this and then took this graft meticulously and put it in the area of the fracture.  Once this was done, we took our final x-rays.  We were satisfied with all screw lengths and the wounds were then closed in layers.  Sterile compressive dressing was applied.  The patient was taken to recovery and she was noted to be in satisfactory condition. Estimated blood loss for procedure was minimal.  Alta Corning, M.D.     Corliss Skains  D:  11/05/2014  T:  11/06/2014  Job:  [id number]

## 2014-11-11 ENCOUNTER — Encounter (HOSPITAL_BASED_OUTPATIENT_CLINIC_OR_DEPARTMENT_OTHER): Payer: Self-pay | Admitting: Orthopedic Surgery

## 2015-04-24 ENCOUNTER — Encounter: Payer: Self-pay | Admitting: Gastroenterology

## 2015-09-17 ENCOUNTER — Emergency Department (HOSPITAL_COMMUNITY)
Admission: EM | Admit: 2015-09-17 | Discharge: 2015-09-17 | Disposition: A | Discharge status: Home / Self Care | Payer: Medicare Other | Attending: Emergency Medicine | Admitting: Emergency Medicine

## 2015-09-17 ENCOUNTER — Encounter (HOSPITAL_COMMUNITY): Payer: Self-pay | Admitting: *Deleted

## 2015-09-17 DIAGNOSIS — Z79899 Other long term (current) drug therapy: Secondary | ICD-10-CM | POA: Insufficient documentation

## 2015-09-17 DIAGNOSIS — F17210 Nicotine dependence, cigarettes, uncomplicated: Secondary | ICD-10-CM | POA: Insufficient documentation

## 2015-09-17 DIAGNOSIS — Z7982 Long term (current) use of aspirin: Secondary | ICD-10-CM | POA: Insufficient documentation

## 2015-09-17 DIAGNOSIS — F419 Anxiety disorder, unspecified: Secondary | ICD-10-CM | POA: Insufficient documentation

## 2015-09-17 DIAGNOSIS — F329 Major depressive disorder, single episode, unspecified: Secondary | ICD-10-CM | POA: Diagnosis not present

## 2015-09-17 DIAGNOSIS — L0231 Cutaneous abscess of buttock: Secondary | ICD-10-CM | POA: Diagnosis not present

## 2015-09-17 DIAGNOSIS — Z87828 Personal history of other (healed) physical injury and trauma: Secondary | ICD-10-CM | POA: Insufficient documentation

## 2015-09-17 DIAGNOSIS — Z8739 Personal history of other diseases of the musculoskeletal system and connective tissue: Secondary | ICD-10-CM | POA: Insufficient documentation

## 2015-09-17 DIAGNOSIS — R110 Nausea: Secondary | ICD-10-CM | POA: Insufficient documentation

## 2015-09-17 DIAGNOSIS — K219 Gastro-esophageal reflux disease without esophagitis: Secondary | ICD-10-CM | POA: Insufficient documentation

## 2015-09-17 LAB — CBG MONITORING, ED: Glucose-Capillary: 99 mg/dL (ref 65–99)

## 2015-09-17 MED ORDER — SULFAMETHOXAZOLE-TRIMETHOPRIM 800-160 MG PO TABS
1.0000 | ORAL_TABLET | Freq: Two times a day (BID) | ORAL | Status: AC
Start: 2015-09-17 — End: 2015-09-24

## 2015-09-17 MED ORDER — SULFAMETHOXAZOLE-TRIMETHOPRIM 800-160 MG PO TABS
1.0000 | ORAL_TABLET | Freq: Once | ORAL | Status: AC
  Administered 2015-09-17: 1 via ORAL
  Filled 2015-09-17: qty 1

## 2015-09-17 MED ORDER — LIDOCAINE-EPINEPHRINE (PF) 2 %-1:200000 IJ SOLN: 10.0000 mL | Freq: Once | INTRAMUSCULAR | Status: DC

## 2015-09-17 MED ORDER — LIDOCAINE-EPINEPHRINE (PF) 2 %-1:200000 IJ SOLN
INTRAMUSCULAR | Status: AC
  Filled 2015-09-17: qty 20

## 2015-09-17 MED ORDER — ONDANSETRON HCL 8 MG PO TABS: 8.0000 mg | ORAL_TABLET | Freq: Three times a day (TID) | ORAL | Status: DC | PRN

## 2015-09-17 NOTE — Discharge Instructions (Signed)
Glucose was normal.  Antibiotic twice a day. Take second dose night. Can shower. Recheck in 2 days. Also prescription for nausea medication

## 2015-09-17 NOTE — ED Provider Notes (Signed)
CSN: [id number]     Arrival date & time 09/17/15  1145 History   First MD Initiated Contact with Patient 09/17/15 1542     Chief Complaint  Patient presents with  . Abscess  . Nausea     (Consider location/radiation/quality/duration/timing/severity/associated sxs/prior Treatment) HPI..... Tenderness and pain in right medial inferior buttocks area for several days. Patient states there was a rupture with fluid release. She feels nauseated, but no fever or chills. She is not diabetic. Severity symptoms moderate.  Past Medical History  Diagnosis Date  . GERD (gastroesophageal reflux disease)   . IBS (irritable bowel syndrome)     no current med.  . Anxiety   . Depression   . Arthritis     left knee  . Erosive esophagitis   . Diverticulosis   . 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 Knee arthroscopy  2011    left  . Wisdom tooth extraction    . Hysteroscopy w/d&c  06/22/2012    Procedure: DILATATION AND CURETTAGE /HYSTEROSCOPY;  Surgeon: Lahoma Crocker, MD;  Location: [facility];  Service: Gynecology;  Laterality: N/A;  . Cholecystectomy  08/02/2012    Procedure: LAPAROSCOPIC CHOLECYSTECTOMY WITH INTRAOPERATIVE CHOLANGIOGRAM;  Surgeon: Rolm Bookbinder, MD;  Location: [place];  Service: General;  Laterality: N/A;  . Colonoscopy with propofol  04/09/2014  . Esophagogastroduodenoscopy (egd) with propofol  04/09/2014  . Orif toe fracture Left 11/05/2014    Procedure: OPEN REDUCTION INTERNAL FIXATION (ORIF) LEFT FIFTH METATARSAL (TOE) FRACTURE WITH CALCANEAL BONE GRAFT;  Surgeon: Dorna Leitz, MD;  Location: [place];  Service: Orthopedics;  Laterality: Left;   Family History  Problem Relation Age of Onset  . Heart disease Father   . Stroke Father   . Cancer - Colon Maternal Grandfather   . Colon cancer Maternal Grandfather   . Cancer Paternal Grandmother     stomach  . Stomach  cancer Paternal Grandmother   . Cancer - Ovarian Mother   . Cancer - Ovarian Sister    Social History  Substance Use Topics  . Smoking status: Current Every Day Smoker -- 0.50 packs/day for 28 years    Types: Cigarettes  . Smokeless tobacco: Never Used  . Alcohol Use: No   OB History    No data available     Review of Systems  All other systems reviewed and are negative.     Allergies  Review of patient's allergies indicates no known allergies.  Home Medications   Prior to Admission medications   Medication Sig Start Date End Date Taking? Authorizing Provider  butalbital-acetaminophen-caffeine (FIORICET, ESGIC) 50-325-40 MG per tablet Take by mouth 2 (two) times daily as needed for headache.   Yes Historical Provider, MD  ciprofloxacin (CIPRO) 500 MG tablet Take 500 mg by mouth 2 (two) times daily. Pt has left over dose and took 1 pill on 09/16/15, has 2-3 pills left. 07/14/15  Yes Historical Provider, MD  clonazePAM (KLONOPIN) 1 MG tablet Take 1 mg by mouth 3 (three) times daily as needed. anxiety 09/14/15  Yes Historical Provider, MD  dexlansoprazole (DEXILANT) 60 MG capsule Take 60 mg by mouth daily.   Yes Historical Provider, MD  diphenhydrAMINE (SOMINEX) 25 MG tablet Take 25 mg by mouth at bedtime as needed for sleep.   Yes Historical Provider, MD  traZODone (DESYREL) 50 MG tablet Take 50 mg by mouth at bedtime as needed.  sleep 09/08/15  Yes Historical Provider, MD  zolpidem (AMBIEN) 10 MG tablet Take 10 mg by mouth at bedtime. 09/14/15  Yes Historical Provider, MD  escitalopram (LEXAPRO) 20 MG tablet Take 20 mg by mouth daily. Reported on 09/17/2015 09/08/15   Historical Provider, MD  nicotine (NICODERM CQ) 14 mg/24hr patch Place 1 patch (14 mg total) onto the skin daily. Patient not taking: Reported on 09/17/2015 11/05/14   Gary Fleet, PA-C  ondansetron (ZOFRAN) 8 MG tablet Take 1 tablet (8 mg total) by mouth every 8 (eight) hours as needed for nausea or vomiting. 09/17/15    Nat Christen, MD  sulfamethoxazole-trimethoprim (BACTRIM DS,SEPTRA DS) 800-160 MG tablet Take 1 tablet by mouth 2 (two) times daily. 09/17/15 09/24/15  Nat Christen, MD   BP 116/72 mmHg  Pulse 83  Temp(Src) 98 F (36.7 C) (Oral)  Resp 18  SpO2 100%  LMP 06/14/2012 Physical Exam  Constitutional: She is oriented to person, place, and time. She appears well-developed and well-nourished.  HENT:  Head: Normocephalic and atraumatic.  Eyes: Conjunctivae and EOM are normal. Pupils are equal, round, and reactive to light.  Neck: Normal range of motion. Neck supple.  Musculoskeletal: Normal range of motion.  Neurological: She is alert and oriented to person, place, and time.  Skin: Skin is warm and dry.  Fair induration approximately 2.5 cm in diameter in the right medial inferior buttocks.  Psychiatric: She has a normal mood and affect. Her behavior is normal.  Nursing note and vitals reviewed.   ED Course  .Marland KitchenIncision and Drainage Date/Time: 09/17/2015 6:26 PM Performed by: Nat Christen Authorized by: Nat Christen Consent: Verbal consent obtained. Risks and benefits: risks, benefits and alternatives were discussed Consent given by: patient Patient understanding: patient states understanding of the procedure being performed Comments: Complex abscess anesthetized with 1% Xylocaine with epinephrine 1-200,000 approximately 7 mL. Stab blade with a #11 blade. Loculations broken up.  Moderate amount of pus expressed. 1/4 inch iodoform gauze place. Minimal blood loss. Patient tolerated procedure well.   (including critical care time) Labs Review Labs Reviewed  CBG MONITORING, ED    Imaging Review No results found. I have personally reviewed and evaluated these images and lab results as part of my medical decision-making.   EKG Interpretation None      MDM   Final diagnoses:  Abscess of right buttock    Incision and drainage of buttock abscess. Rx Septra DS twice a day and Zofran 8 mg  for nausea. Glucose normal. Recheck in 2 days.    Nat Christen, MD 09/17/15 [phone]

## 2015-09-17 NOTE — ED Notes (Signed)
Pt states she noticed an abscess on her upper posterior thigh on Friday. Pt states the abscess burst on Sunday. Pt states the abscess became enlarged again on Monday and has gotten worse since. Pt states she now feels nauseas and has thrown up 4 times since Monday.

## 2015-09-22 ENCOUNTER — Encounter (HOSPITAL_COMMUNITY): Payer: Self-pay | Admitting: *Deleted

## 2015-09-22 ENCOUNTER — Inpatient Hospital Stay (HOSPITAL_COMMUNITY)
Admission: AD | Admit: 2015-09-22 | Discharge: 2015-09-22 | Disposition: A | Discharge status: Home / Self Care | Payer: Medicare Other | Source: Ambulatory Visit | Attending: Obstetrics and Gynecology | Admitting: Obstetrics and Gynecology

## 2015-09-22 DIAGNOSIS — L0231 Cutaneous abscess of buttock: Secondary | ICD-10-CM | POA: Insufficient documentation

## 2015-09-22 DIAGNOSIS — F17210 Nicotine dependence, cigarettes, uncomplicated: Secondary | ICD-10-CM | POA: Diagnosis not present

## 2015-09-22 DIAGNOSIS — Z3202 Encounter for pregnancy test, result negative: Secondary | ICD-10-CM | POA: Diagnosis not present

## 2015-09-22 LAB — URINALYSIS, ROUTINE W REFLEX MICROSCOPIC
BILIRUBIN URINE: NEGATIVE
Glucose, UA: NEGATIVE mg/dL
HGB URINE DIPSTICK: NEGATIVE
KETONES UR: NEGATIVE mg/dL
Leukocytes, UA: NEGATIVE
Nitrite: NEGATIVE
PROTEIN: NEGATIVE mg/dL
Specific Gravity, Urine: 1.02 (ref 1.005–1.030)
pH: 5.5 (ref 5.0–8.0)

## 2015-09-22 LAB — POCT PREGNANCY, URINE: PREG TEST UR: NEGATIVE

## 2015-09-22 MED ORDER — IBUPROFEN 800 MG PO TABS: 800.0000 mg | ORAL_TABLET | Freq: Four times a day (QID) | ORAL | Status: DC | PRN

## 2015-09-22 MED ORDER — IBUPROFEN 800 MG PO TABS
800.0000 mg | ORAL_TABLET | Freq: Once | ORAL | Status: AC
  Administered 2015-09-22: 800 mg via ORAL
  Filled 2015-09-22: qty 1

## 2015-09-22 MED ORDER — PROMETHAZINE HCL 25 MG PO TABS: 25.0000 mg | ORAL_TABLET | Freq: Four times a day (QID) | ORAL | Status: DC | PRN

## 2015-09-22 MED ORDER — PROMETHAZINE HCL 25 MG PO TABS
25.0000 mg | ORAL_TABLET | Freq: Once | ORAL | Status: AC
  Administered 2015-09-22: 25 mg via ORAL
  Filled 2015-09-22: qty 1

## 2015-09-22 NOTE — Discharge Instructions (Signed)

## 2015-09-22 NOTE — MAU Note (Signed)
Pt presents to MAU with complaints of pain in her buttocks. States she had an ingrown hair that turned into a boil. Pt went to [facility] and they drained the abscess and packed it on Thursday. She was told to follow up and  She decided to come here for follow up

## 2015-09-22 NOTE — MAU Note (Signed)
Pt. States that had an ingrown hair that turned into a boil. Had this drained Thursday. Packing came out that same night. States she felt some relief after this procedure and now feels that it has returned. Pt. States she is using Bactrim and pain medication. Here for follow up and evaluation.

## 2015-09-22 NOTE — MAU Provider Note (Signed)
Chief Complaint: No chief complaint on file.   First Provider Initiated Contact with Patient 09/22/15 1529     SUBJECTIVE HPI: [name] is a 45 y.o. non-pregnant female who presents to [facility] for would packing. Was seen at [facility] 09/17/15 for a boil on her left buttock that had started to drain. If was purther drained and packed and she was Rx'd Keflex and Percocet. States she was told to F/U at [facility] in 2 days to shange the packing but [facility] had a very long weit so she came to [facility]. Also states packing fell out 1 day after it was placed. Taking Keflex as Rx'd. Not taking percocet much. Taking Ibuprofen w/ adequate pain relief.   Location: Left buttock Quality: sore Severity: Moderate Duration: 1 week Context: From ingrown hair Course: Improving Timing: Constant Modifying factors: Improves w/ ibuprofen and not sitting up.  Associated signs and symptoms: Pos for nausea.  Neg for fever, chills, body aches.  Past Medical History  Diagnosis Date  . GERD (gastroesophageal reflux disease)   . IBS (irritable bowel syndrome)     no current med.  . Anxiety   . Depression   . Arthritis     left knee  . Erosive esophagitis   . Diverticulosis   . 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 Knee arthroscopy  2011    left  . Wisdom tooth extraction    . Hysteroscopy w/d&c  06/22/2012    Procedure: DILATATION AND CURETTAGE /HYSTEROSCOPY;  Surgeon: Lahoma Crocker, MD;  Location: [facility];  Service: Gynecology;  Laterality: N/A;  . Cholecystectomy  08/02/2012    Procedure: LAPAROSCOPIC CHOLECYSTECTOMY WITH INTRAOPERATIVE CHOLANGIOGRAM;  Surgeon: Rolm Bookbinder, MD;  Location: [place];  Service: General;  Laterality: N/A;  . Colonoscopy with propofol  04/09/2014  . Esophagogastroduodenoscopy (egd) with propofol  04/09/2014  . Orif toe fracture Left 11/05/2014   Procedure: OPEN REDUCTION INTERNAL FIXATION (ORIF) LEFT FIFTH METATARSAL (TOE) FRACTURE WITH CALCANEAL BONE GRAFT;  Surgeon: Dorna Leitz, MD;  Location: [place];  Service: Orthopedics;  Laterality: Left;   Social History   Social History  . Marital Status: Single    Spouse Name: N/A  . Number of Children: N/A  . Years of Education: N/A   Occupational History  . Not on file.   Social History Main Topics  . Smoking status: Current Every Day Smoker -- 0.50 packs/day for 28 years    Types: Cigarettes  . Smokeless tobacco: Never Used  . Alcohol Use: No  . Drug Use: No  . Sexual Activity: Yes    Birth Control/ Protection: None   Other Topics Concern  . Not on file   Social History Narrative   No current facility-administered medications on file prior to encounter.   Current Outpatient Prescriptions on File Prior to Encounter  Medication Sig Dispense Refill  . butalbital-acetaminophen-caffeine (FIORICET, ESGIC) 50-325-40 MG per tablet Take by mouth 2 (two) times daily as needed for headache.    . clonazePAM (KLONOPIN) 1 MG tablet Take 1 mg by mouth 3 (three) times daily as needed. anxiety    . dexlansoprazole (DEXILANT) 60 MG capsule Take 60 mg by mouth daily.    . diphenhydrAMINE (SOMINEX) 25 MG tablet Take 25 mg by mouth at bedtime as needed for sleep.    [name] escitalopram (LEXAPRO) 20 MG  tablet Take 20 mg by mouth daily. Reported on 09/17/2015    . ondansetron (ZOFRAN) 8 MG tablet Take 1 tablet (8 mg total) by mouth every 8 (eight) hours as needed for nausea or vomiting. 10 tablet 0  . sulfamethoxazole-trimethoprim (BACTRIM DS,SEPTRA DS) 800-160 MG tablet Take 1 tablet by mouth 2 (two) times daily. 20 tablet 0  . traZODone (DESYREL) 50 MG tablet Take 50 mg by mouth at bedtime as needed. sleep    . zolpidem (AMBIEN) 10 MG tablet Take 10 mg by mouth at bedtime.     No Known Allergies  I have reviewed the past Medical Hx, Surgical Hx, Social Hx, Allergies and  Medications.   Review of Systems  Constitutional: Negative for fever and chills.  Musculoskeletal: Negative for myalgias.  Skin:       Pos for tender mass. Neg for any further drainage.    OBJECTIVE Patient Vitals for the past 24 hrs:  BP Temp Temp src Pulse Resp SpO2 Height Weight  09/22/15 1434 101/74 mmHg 97.9 F (36.6 C) Oral 82 18 97 % 5' 1.81" (1.57 m) 165 lb 3.2 oz (74.934 kg)   Constitutional: Well-developed, well-nourished female in no acute distress.  Cardiovascular: normal rate Respiratory: normal rate and effort.  Skin; 2x3 cm healing, closed, mildly tender, non-fluctuantm firm mass on left buttock. No erythema or swelling.   Neurologic: Alert and oriented x 4.   LAB RESULTS Results for orders placed or performed during the hospital encounter of 09/22/15 (from the past 24 hour(s))  Pregnancy, urine POC     Status: None   Collection Time: 09/22/15  2:48 PM  Result Value Ref Range   Preg Test, Ur NEGATIVE NEGATIVE    IMAGING No results found.  MAU COURSE/MDM Abscess healing appropriately. Packing not needed or possible due to healing.  ASSESSMENT 1. Abscess of left buttock     PLAN Discharge home in stable condition. Infection Precautions Rx ibuprofen. Complete course of Bactrim.  Follow-up Information    Follow up with [facility], NP. Schedule an appointment as soon as possible for a visit on 09/25/2015.   Specialty:  Nurse Practitioner   Contact information:   [place] [address] [phone]       Follow up with [facility].   Specialty:  Emergency Medicine   Why:  As needed if symptoms worsen   Contact information:   [address] [id number] [place] [phone]       Medication List    TAKE these medications        butalbital-acetaminophen-caffeine 50-325-40 MG tablet  Commonly known as:  FIORICET, ESGIC  Take by mouth 2 (two) times  daily as needed for headache.     clonazePAM 1 MG tablet  Commonly known as:  KLONOPIN  Take 1 mg by mouth 3 (three) times daily as needed. anxiety     DEXILANT 60 MG capsule  Generic drug:  dexlansoprazole  Take 60 mg by mouth daily.     diphenhydrAMINE 25 MG tablet  Commonly known as:  SOMINEX  Take 25 mg by mouth at bedtime as needed for sleep.     escitalopram 20 MG tablet  Commonly known as:  LEXAPRO  Take 20 mg by mouth daily. Reported on 09/17/2015     ibuprofen 800 MG tablet  Commonly known as:  ADVIL,MOTRIN  Take 1 tablet (800 mg total) by mouth every 6 (six) hours as needed for mild  pain or moderate pain.     ondansetron 8 MG tablet  Commonly known as:  ZOFRAN  Take 1 tablet (8 mg total) by mouth every 8 (eight) hours as needed for nausea or vomiting.     oxyCODONE-acetaminophen 5-325 MG tablet  Commonly known as:  PERCOCET/ROXICET  Take 1-2 tablets by mouth every 4 (four) hours as needed for moderate pain or severe pain.     promethazine 25 MG tablet  Commonly known as:  PHENERGAN  Take 1 tablet (25 mg total) by mouth every 6 (six) hours as needed for nausea or vomiting.     sulfamethoxazole-trimethoprim 800-160 MG tablet  Commonly known as:  BACTRIM DS,SEPTRA DS  Take 1 tablet by mouth 2 (two) times daily.     traZODone 50 MG tablet  Commonly known as:  DESYREL  Take 50 mg by mouth at bedtime as needed. sleep     zolpidem 10 MG tablet  Commonly known as:  AMBIEN  Take 10 mg by mouth at bedtime.       [place], [place] 09/22/2015  4:31 PM

## 2015-12-12 ENCOUNTER — Emergency Department (HOSPITAL_COMMUNITY)
Admission: EM | Admit: 2015-12-12 | Discharge: 2015-12-12 | Discharge status: Against Medical Advice | Payer: Medicare Other | Attending: Emergency Medicine | Admitting: Emergency Medicine

## 2015-12-12 ENCOUNTER — Emergency Department (HOSPITAL_COMMUNITY): Payer: Medicare Other

## 2015-12-12 ENCOUNTER — Encounter (HOSPITAL_COMMUNITY): Payer: Self-pay

## 2015-12-12 DIAGNOSIS — M1712 Unilateral primary osteoarthritis, left knee: Secondary | ICD-10-CM | POA: Diagnosis not present

## 2015-12-12 DIAGNOSIS — F17210 Nicotine dependence, cigarettes, uncomplicated: Secondary | ICD-10-CM | POA: Insufficient documentation

## 2015-12-12 DIAGNOSIS — Z79899 Other long term (current) drug therapy: Secondary | ICD-10-CM | POA: Insufficient documentation

## 2015-12-12 DIAGNOSIS — K208 Other esophagitis: Secondary | ICD-10-CM | POA: Diagnosis not present

## 2015-12-12 DIAGNOSIS — R197 Diarrhea, unspecified: Secondary | ICD-10-CM

## 2015-12-12 DIAGNOSIS — R0789 Other chest pain: Secondary | ICD-10-CM | POA: Diagnosis not present

## 2015-12-12 DIAGNOSIS — Z8781 Personal history of (healed) traumatic fracture: Secondary | ICD-10-CM | POA: Insufficient documentation

## 2015-12-12 DIAGNOSIS — K219 Gastro-esophageal reflux disease without esophagitis: Secondary | ICD-10-CM | POA: Diagnosis not present

## 2015-12-12 DIAGNOSIS — R110 Nausea: Secondary | ICD-10-CM | POA: Insufficient documentation

## 2015-12-12 DIAGNOSIS — F419 Anxiety disorder, unspecified: Secondary | ICD-10-CM | POA: Insufficient documentation

## 2015-12-12 DIAGNOSIS — F329 Major depressive disorder, single episode, unspecified: Secondary | ICD-10-CM | POA: Diagnosis not present

## 2015-12-12 LAB — BASIC METABOLIC PANEL
ANION GAP: 9 (ref 5–15)
BUN: 5 mg/dL — ABNORMAL LOW (ref 6–20)
CO2: 24 mmol/L (ref 22–32)
Calcium: 8.8 mg/dL — ABNORMAL LOW (ref 8.9–10.3)
Chloride: 106 mmol/L (ref 101–111)
Creatinine, Ser: 1 mg/dL (ref 0.44–1.00)
GFR calc Af Amer: >60 mL/min (ref >60)
GLUCOSE: 122 mg/dL — AB (ref 65–99)
POTASSIUM: 3.6 mmol/L (ref 3.5–5.1)
Sodium: 139 mmol/L (ref 135–145)

## 2015-12-12 LAB — CBC
HEMATOCRIT: 43.7 % (ref 36.0–46.0)
Hemoglobin: 14.6 g/dL (ref 12.0–15.0)
MCH: 31.3 pg (ref 26.0–34.0)
MCHC: 33.4 g/dL (ref 30.0–36.0)
MCV: 93.6 fL (ref 78.0–100.0)
Platelets: 294 10*3/uL (ref 150–400)
RBC: 4.67 MIL/uL (ref 3.87–5.11)
RDW: 12.5 % (ref 11.5–15.5)
WBC: 9.8 10*3/uL (ref 4.0–10.5)

## 2015-12-12 LAB — URINALYSIS, ROUTINE W REFLEX MICROSCOPIC
Bilirubin Urine: NEGATIVE
Glucose, UA: NEGATIVE mg/dL
Hgb urine dipstick: NEGATIVE
KETONES UR: NEGATIVE mg/dL
NITRITE: NEGATIVE
PH: 7 (ref 5.0–8.0)
PROTEIN: NEGATIVE mg/dL
Specific Gravity, Urine: 1.004 — ABNORMAL LOW (ref 1.005–1.030)

## 2015-12-12 LAB — I-STAT TROPONIN, ED: Troponin i, poc: 0 ng/mL (ref 0.00–0.08)

## 2015-12-12 LAB — URINE MICROSCOPIC-ADD ON: RBC / HPF: NONE SEEN RBC/hpf (ref 0–5)

## 2015-12-12 LAB — LIPASE, BLOOD: Lipase: 20 U/L (ref 11–51)

## 2015-12-12 MED ORDER — SODIUM CHLORIDE 0.9 % IV BOLUS (SEPSIS)
1000.0000 mL | Freq: Once | INTRAVENOUS | Status: AC
  Administered 2015-12-12: 1000 mL via INTRAVENOUS

## 2015-12-12 MED ORDER — SODIUM CHLORIDE 0.9 % IV BOLUS (SEPSIS): 1000.0000 mL | Freq: Once | INTRAVENOUS | Status: DC

## 2015-12-12 MED ORDER — ONDANSETRON HCL 4 MG/2ML IJ SOLN
4.0000 mg | Freq: Once | INTRAMUSCULAR | Status: AC
  Administered 2015-12-12: 4 mg via INTRAVENOUS
  Filled 2015-12-12: qty 2

## 2015-12-12 NOTE — ED Notes (Signed)
MD at bedside. 

## 2015-12-12 NOTE — ED Provider Notes (Signed)
CSN: [id number]     Arrival date & time 12/12/15  1222 History   First MD Initiated Contact with Patient 12/12/15 1342     Chief Complaint  Patient presents with  . Chest Pain  . Abdominal Pain  . Loss of Consciousness     (Consider location/radiation/quality/duration/timing/severity/associated sxs/prior Treatment) HPI  Pt presenting with c/o nausea and diarrhea which she states has been going on for the past 2 weeks.  No vomiting today, she has had some loose stools.  She also states she feels some chest pressure when she has nausea. She also c/o feeling more tired and had a fainting episode this morning when getting up to go to the bathroom.  No chest pain or palpitations associated,  Pt described feeling lightheaded with standing and then falling to the floor.  No difficulty breathing.  She states she has been urinating frequently.  She has been drinking liquids without difficulty but states she has not been able to eat much due to the nausea.  No blood in stool.  No vaginal bleeding or discharge.  There are no other associated systemic symptoms, there are no other alleviating or modifying factors.   Past Medical History  Diagnosis Date  . GERD (gastroesophageal reflux disease)   . IBS (irritable bowel syndrome)     no current med.  . Anxiety   . Depression   . Arthritis     left knee  . Erosive esophagitis   . Diverticulosis   . 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 Knee arthroscopy  2011    left  . Wisdom tooth extraction    . Hysteroscopy w/d&c  06/22/2012    Procedure: DILATATION AND CURETTAGE /HYSTEROSCOPY;  Surgeon: Lahoma Crocker, MD;  Location: [facility];  Service: Gynecology;  Laterality: N/A;  . Cholecystectomy  08/02/2012    Procedure: LAPAROSCOPIC CHOLECYSTECTOMY WITH INTRAOPERATIVE CHOLANGIOGRAM;  Surgeon: Rolm Bookbinder, MD;  Location: [place];  Service: General;  Laterality:  N/A;  . Colonoscopy with propofol  04/09/2014  . Esophagogastroduodenoscopy (egd) with propofol  04/09/2014  . Orif toe fracture Left 11/05/2014    Procedure: OPEN REDUCTION INTERNAL FIXATION (ORIF) LEFT FIFTH METATARSAL (TOE) FRACTURE WITH CALCANEAL BONE GRAFT;  Surgeon: Dorna Leitz, MD;  Location: [place];  Service: Orthopedics;  Laterality: Left;   Family History  Problem Relation Age of Onset  . Heart disease Father   . Stroke Father   . Cancer - Colon Maternal Grandfather   . Colon cancer Maternal Grandfather   . Cancer Paternal Grandmother     stomach  . Stomach cancer Paternal Grandmother   . Cancer - Ovarian Mother   . Cancer - Ovarian Sister    Social History  Substance Use Topics  . Smoking status: Current Every Day Smoker -- 0.50 packs/day for 28 years    Types: Cigarettes  . Smokeless tobacco: Never Used  . Alcohol Use: No   OB History    No data available     Review of Systems  ROS reviewed and all otherwise negative except for mentioned in HPI    Allergies  Codeine  Home Medications   Prior to Admission medications   Medication Sig Start Date End Date Taking? Authorizing Provider  clonazePAM (KLONOPIN) 1 MG tablet Take 1 mg by mouth 3 (three) times daily as needed for anxiety. anxiety 09/14/15  Yes Historical Provider, MD  dexlansoprazole (  DEXILANT) 60 MG capsule Take 60 mg by mouth daily.   Yes Historical Provider, MD  dimenhyDRINATE (DRAMAMINE) 50 MG tablet Take 50 mg by mouth every 8 (eight) hours as needed for nausea.   Yes Historical Provider, MD  escitalopram (LEXAPRO) 20 MG tablet Take 20 mg by mouth daily. Reported on 09/17/2015 09/08/15  Yes Historical Provider, MD  traZODone (DESYREL) 50 MG tablet Take 50 mg by mouth at bedtime as needed for sleep.  09/08/15  Yes Historical Provider, MD  zolpidem (AMBIEN) 10 MG tablet Take 10 mg by mouth at bedtime. 09/14/15  Yes Historical Provider, MD  ibuprofen (ADVIL,MOTRIN) 800 MG tablet Take 1  tablet (800 mg total) by mouth every 6 (six) hours as needed for mild pain or moderate pain. Patient not taking: Reported on 12/12/2015 09/22/15   Manya Silvas, CNM  ondansetron (ZOFRAN) 8 MG tablet Take 1 tablet (8 mg total) by mouth every 8 (eight) hours as needed for nausea or vomiting. Patient not taking: Reported on 12/12/2015 09/17/15   Nat Christen, MD  promethazine (PHENERGAN) 25 MG tablet Take 1 tablet (25 mg total) by mouth every 6 (six) hours as needed for nausea or vomiting. Patient not taking: Reported on 12/12/2015 09/22/15   Manya Silvas, CNM   BP 121/75 mmHg  Pulse 64  Temp(Src) 97.8 F (36.6 C) (Oral)  Resp 12  SpO2 100%  LMP 06/14/2012  Vitals reviewed Physical Exam  Physical Examination: General appearance - alert, well appearing, and in no distress Mental status - alert, oriented to person, place, and time Eyes - no conjunctival injection, no scleral icterus Mouth - mucous membranes moist, pharynx normal without lesions Chest - clear to auscultation, no wheezes, rales or rhonchi, symmetric air entry Heart - normal rate, regular rhythm, normal S1, S2, no murmurs, rubs, clicks or gallops, brisk cap refill Abdomen - soft, nontender, nondistended, no masses or organomegaly Neurological - alert, oriented, normal speech Extremities - peripheral pulses normal, no pedal edema, no clubbing or cyanosis Skin - normal coloration and turgor, no rashes  ED Course  Procedures (including critical care time) Labs Review Labs Reviewed  BASIC METABOLIC PANEL - Abnormal; Notable for the following:    Glucose, Bld 122 (*)    BUN 5 (*)    Calcium 8.8 (*)    All other components within normal limits  URINALYSIS, ROUTINE W REFLEX MICROSCOPIC (NOT AT [facility]) - Abnormal; Notable for the following:    Specific Gravity, Urine 1.004 (*)    Leukocytes, UA SMALL (*)    All other components within normal limits  URINE MICROSCOPIC-ADD ON - Abnormal; Notable for the following:    Squamous  Epithelial / LPF 6-30 (*)    Bacteria, UA FEW (*)    All other components within normal limits  URINE CULTURE  CBC  LIPASE, BLOOD  I-STAT TROPOININ, ED    Imaging Review Dg Chest 2 View  12/12/2015  CLINICAL DATA:  Left chest pain radiating to the left shoulder. Vomiting and diarrhea for the past 2 weeks. Smoker. EXAM: CHEST  2 VIEW COMPARISON:  07/31/2012. FINDINGS: Normal sized heart. Clear lungs. Mild central peribronchial thickening. Unremarkable bones. IMPRESSION: Mild chronic bronchitic changes.  No acute abnormality. Electronically Signed   By: Claudie Revering M.D.   On: 12/12/2015 13:49   I have personally reviewed and evaluated these images and lab results as part of my medical decision-making.   EKG Interpretation   Date/Time:  Saturday December 12 2015 12:30:06 EDT Ventricular Rate:  88 PR Interval:  180 QRS Duration: 80 QT Interval:  358 QTC Calculation: 433 R Axis:   70 Text Interpretation:  Sinus rhythm Borderline T abnormalities, anterior  leads No significant change since last tracing Confirmed by [facility]  MD,  MARTHA [phone]) on 12/12/2015 3:41:48 PM      MDM   Final diagnoses:  Diarrhea, unspecified type  Nausea    Pt presenting with c/o diarrhea, nausea, chest pressure- she states symptoms have been ongoing for the past 2 weeks.  Abdominal exam is benign.  Pt appears well hdyrated.  EKG is reassuring and troponin is negative- I do not feel these symptoms represent ACS.  Labs are reassuring, including CBC and electrolytes, no liver or pancreas abnormalities.  Pt treated with zofran and IV fluids.    4:12 PM informed by nurse that patient has just left the ED AMA.  She told the nurse that she wanted to get phenergan- I had not heard this request- but before the nurse could ask me for further nausea meds patient demanded to leave.    Alfonzo Beers, MD 12/13/15 1236

## 2015-12-12 NOTE — ED Notes (Addendum)
Pt is angry d/t not getting phenergan.  Informed pt will request from MD.  Pt demanded SL be removed & monitoring.  Request accommodated.  Pt stated "this has been going on x 2 wks.  I'll just wait to see my doctor."

## 2015-12-12 NOTE — ED Notes (Signed)
Pt presents with c/o left sided chest pain that started approx 3 days ago, pressure to her chest, radiation to her back. Pt also c/o abdominal pain and nausea. Pt also reporting that she had a syncopal episode this morning after getting out of bed. Pt reports she only remembers waking up in front of her bed after she had gotten up to use the restroom, positive LOC. Pt unsure as to whether she hit her head.

## 2015-12-12 NOTE — ED Notes (Signed)
Dr. Canary Brim informed pt left AMA.

## 2015-12-14 LAB — URINE CULTURE

## 2015-12-15 ENCOUNTER — Telehealth: Payer: Self-pay | Admitting: *Deleted

## 2015-12-15 NOTE — ED Notes (Signed)
Post ED Visit - Positive Culture Follow-up  Culture report reviewed by antimicrobial stewardship pharmacist:  []  Elenor Quinones, Pharm.D. []  Heide Guile, Pharm.D., BCPS []  Parks Neptune, Pharm.D. []  Alycia Rossetti, Pharm.D., BCPS []  [place], [place].D., BCPS, AAHIVP []  Legrand Como, Pharm.D., BCPS, AAHIVP []  Milus Glazier, Pharm.D. []  Stephens November, [place].D.  Positive urine culture No further patient follow-up is required at this time per Shary Decamp, PA-C  Ardeen Fillers 12/15/2015, 10:45 AM

## 2015-12-15 NOTE — Progress Notes (Signed)
ED Antimicrobial Stewardship Positive Culture Follow Up   [name] is an 45 y.o. female who presented to [facility] on 12/12/2015 with a chief complaint of  Chief Complaint  Patient presents with  . Chest Pain  . Abdominal Pain  . Loss of Consciousness    Recent Results (from the past 720 hour(s))  Urine culture     Status: Abnormal   Collection Time: 12/12/15  4:05 PM  Result Value Ref Range Status   Specimen Description URINE, CLEAN CATCH  Final   Special Requests NONE  Final   Culture >=100,000 COLONIES/mL KLEBSIELLA PNEUMONIAE (A)  Final   Report Status 12/14/2015 FINAL  Final   Organism ID, Bacteria KLEBSIELLA PNEUMONIAE (A)  Final      Susceptibility   Klebsiella pneumoniae - MIC*    AMPICILLIN >=32 RESISTANT Resistant     CEFAZOLIN <=4 SENSITIVE Sensitive     CEFTRIAXONE <=1 SENSITIVE Sensitive     CIPROFLOXACIN <=0.25 SENSITIVE Sensitive     GENTAMICIN <=1 SENSITIVE Sensitive     IMIPENEM <=0.25 SENSITIVE Sensitive     NITROFURANTOIN 64 INTERMEDIATE Intermediate     TRIMETH/SULFA <=20 SENSITIVE Sensitive     AMPICILLIN/SULBACTAM 4 SENSITIVE Sensitive     PIP/TAZO <=4 SENSITIVE Sensitive     * >=100,000 COLONIES/mL KLEBSIELLA PNEUMONIAE    Pt presented w/ chest pain radiating to back. Afebrile, no symptoms of UTI. No treatment indicated.   ED Provider: Shary Decamp, PA-C  Manjot Hinks C. Lennox Grumbles, PharmD Pharmacy Resident  Pager: [phone] 12/15/2015 8:47 AM

## 2016-02-03 ENCOUNTER — Encounter (HOSPITAL_BASED_OUTPATIENT_CLINIC_OR_DEPARTMENT_OTHER): Payer: Self-pay

## 2016-02-03 ENCOUNTER — Emergency Department (HOSPITAL_BASED_OUTPATIENT_CLINIC_OR_DEPARTMENT_OTHER)
Admission: EM | Admit: 2016-02-03 | Discharge: 2016-02-03 | Disposition: A | Discharge status: Home / Self Care | Payer: Medicare Other | Attending: Emergency Medicine | Admitting: Emergency Medicine

## 2016-02-03 DIAGNOSIS — M545 Low back pain: Secondary | ICD-10-CM | POA: Insufficient documentation

## 2016-02-03 DIAGNOSIS — R42 Dizziness and giddiness: Secondary | ICD-10-CM | POA: Insufficient documentation

## 2016-02-03 DIAGNOSIS — R1084 Generalized abdominal pain: Secondary | ICD-10-CM | POA: Diagnosis present

## 2016-02-03 DIAGNOSIS — R197 Diarrhea, unspecified: Secondary | ICD-10-CM | POA: Diagnosis not present

## 2016-02-03 DIAGNOSIS — M1712 Unilateral primary osteoarthritis, left knee: Secondary | ICD-10-CM | POA: Insufficient documentation

## 2016-02-03 DIAGNOSIS — F17210 Nicotine dependence, cigarettes, uncomplicated: Secondary | ICD-10-CM | POA: Insufficient documentation

## 2016-02-03 DIAGNOSIS — F329 Major depressive disorder, single episode, unspecified: Secondary | ICD-10-CM | POA: Insufficient documentation

## 2016-02-03 DIAGNOSIS — R21 Rash and other nonspecific skin eruption: Secondary | ICD-10-CM | POA: Insufficient documentation

## 2016-02-03 LAB — CBC WITH DIFFERENTIAL/PLATELET
BASOS ABS: 0 10*3/uL (ref 0.0–0.1)
Basophils Relative: 0 %
EOS ABS: 0.1 10*3/uL (ref 0.0–0.7)
EOS PCT: 2 %
HCT: 47 % — ABNORMAL HIGH (ref 36.0–46.0)
Hemoglobin: 15.7 g/dL — ABNORMAL HIGH (ref 12.0–15.0)
Lymphocytes Relative: 27 %
Lymphs Abs: 1.6 10*3/uL (ref 0.7–4.0)
MCH: 31.7 pg (ref 26.0–34.0)
MCHC: 33.4 g/dL (ref 30.0–36.0)
MCV: 94.9 fL (ref 78.0–100.0)
Monocytes Absolute: 0.4 10*3/uL (ref 0.1–1.0)
Monocytes Relative: 6 %
Neutro Abs: 3.9 10*3/uL (ref 1.7–7.7)
Neutrophils Relative %: 65 %
PLATELETS: 285 10*3/uL (ref 150–400)
RBC: 4.95 MIL/uL (ref 3.87–5.11)
RDW: 12.6 % (ref 11.5–15.5)
WBC: 6 10*3/uL (ref 4.0–10.5)

## 2016-02-03 LAB — COMPREHENSIVE METABOLIC PANEL
ALT: 42 U/L (ref 14–54)
ANION GAP: 8 (ref 5–15)
AST: 25 U/L (ref 15–41)
Albumin: 3.8 g/dL (ref 3.5–5.0)
Alkaline Phosphatase: 57 U/L (ref 38–126)
BUN: 3 mg/dL — ABNORMAL LOW (ref 6–20)
CALCIUM: 9 mg/dL (ref 8.9–10.3)
CHLORIDE: 102 mmol/L (ref 101–111)
CO2: 30 mmol/L (ref 22–32)
CREATININE: 0.76 mg/dL (ref 0.44–1.00)
Glucose, Bld: 98 mg/dL (ref 65–99)
Potassium: 3.7 mmol/L (ref 3.5–5.1)
Sodium: 140 mmol/L (ref 135–145)
Total Bilirubin: 0.5 mg/dL (ref 0.3–1.2)
Total Protein: 7 g/dL (ref 6.5–8.1)

## 2016-02-03 LAB — URINALYSIS, ROUTINE W REFLEX MICROSCOPIC
BILIRUBIN URINE: NEGATIVE
Glucose, UA: NEGATIVE mg/dL
Hgb urine dipstick: NEGATIVE
Ketones, ur: NEGATIVE mg/dL
Leukocytes, UA: NEGATIVE
Nitrite: NEGATIVE
PROTEIN: NEGATIVE mg/dL
Specific Gravity, Urine: 1.003 — ABNORMAL LOW (ref 1.005–1.030)
pH: 6 (ref 5.0–8.0)

## 2016-02-03 LAB — SEDIMENTATION RATE: SED RATE: 13 mm/h (ref 0–22)

## 2016-02-03 LAB — LIPASE, BLOOD: LIPASE: 12 U/L (ref 11–51)

## 2016-02-03 MED ORDER — IVERMECTIN 3 MG PO TABS: 200.0000 ug/kg | ORAL_TABLET | Freq: Every day | ORAL | Status: AC

## 2016-02-03 MED ORDER — ONDANSETRON HCL 4 MG/2ML IJ SOLN
4.0000 mg | Freq: Once | INTRAMUSCULAR | Status: AC
  Administered 2016-02-03: 4 mg via INTRAVENOUS
  Filled 2016-02-03: qty 2

## 2016-02-03 MED ORDER — SODIUM CHLORIDE 0.9 % IV BOLUS (SEPSIS)
1000.0000 mL | Freq: Once | INTRAVENOUS | Status: AC
  Administered 2016-02-03: 1000 mL via INTRAVENOUS

## 2016-02-03 NOTE — ED Provider Notes (Signed)
CSN: [id number]     Arrival date & time 02/03/16  1319 History   First MD Initiated Contact with Patient 02/03/16 1346     Chief Complaint  Patient presents with  . Abdominal Pain   HPI [name] is a 45 y.o. female  presenting with abdominal pain, diarrhea, 40 lb weight loss. Patient reports this is a chronic issue, but has seemed to get worse recently. She has been followed for this issue by her PCP, Dr. Toy Cookey, and after discussion today she was told to come here for further evaluation and concern for dehydration because she has had dizziness/lightheadedness for the past day. She says that she has had persistent watery diarrhea with some occasional blood (which she attributes to known hemorrhoids) for the past 3 months. She has been treated with a course of augmentin, and she has just been restarted on another 14 day course of augmentin. She has had recent travel to [place] a few weeks ago but the diarrhea was not really worse after this. She has also tried imodium 0.5-1 tablet without much relief. She has known IBS but says this feels much worse. She has abdominal pain constantly, located all across her stomach; nothing she does makes it better. Anytime she eats or drinks anything within 5 minutes she has a watery bowel movement. She has not had any blood work done so far, but was given a stool sample collection kit today. She does report her dad having IBD, he has since passed away.    (Consider location/radiation/quality/duration/timing/severity/associated sxs/prior Treatment) Patient is a 45 y.o. female presenting with diarrhea. The history is provided by the patient.  Diarrhea Quality:  Watery and bloody Severity:  Severe Number of episodes:  10-12 per day Duration:  3 months Timing:  Constant Progression:  Unchanged Relieved by:  Liquids Ineffective treatments:  Anti-motility medications, liquids and change in diet Associated symptoms: abdominal pain (diffuse, constant)     Associated symptoms: no vomiting   Abdominal pain:    Location:  Generalized   Quality:  Bloating and aching   Severity:  Moderate   Duration:  3 months   Timing:  Constant   Progression:  Unchanged Risk factors: travel to [place] area ([place], [place])     Past Medical History  Diagnosis Date  . GERD (gastroesophageal reflux disease)   . IBS (irritable bowel syndrome)     no current med.  . Anxiety   . Depression   . Arthritis     left knee  . Erosive esophagitis   . Diverticulosis   . 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 Knee arthroscopy  2011    left  . Wisdom tooth extraction    . Hysteroscopy w/d&c  06/22/2012    Procedure: DILATATION AND CURETTAGE /HYSTEROSCOPY;  Surgeon: Lahoma Crocker, MD;  Location: [facility];  Service: Gynecology;  Laterality: N/A;  . Cholecystectomy  08/02/2012    Procedure: LAPAROSCOPIC CHOLECYSTECTOMY WITH INTRAOPERATIVE CHOLANGIOGRAM;  Surgeon: Rolm Bookbinder, MD;  Location: [facility];  Service: General;  Laterality: N/A;  . Colonoscopy with propofol  04/09/2014  . Esophagogastroduodenoscopy (egd) with propofol  04/09/2014  . Orif toe fracture Left 11/05/2014    Procedure: OPEN REDUCTION INTERNAL FIXATION (ORIF) LEFT FIFTH METATARSAL (TOE) FRACTURE WITH CALCANEAL BONE GRAFT;  Surgeon: Dorna Leitz, MD;  Location: [place];  Service: Orthopedics;  Laterality: Left;   Family History  Problem Relation Age of Onset  . Heart disease Father   . Stroke Father   . Cancer - Colon Maternal Grandfather   . Colon cancer Maternal Grandfather   . Cancer Paternal Grandmother     stomach  . Stomach cancer Paternal Grandmother   . Cancer - Ovarian Mother   . Cancer - Ovarian Sister    Social History  Substance Use Topics  . Smoking status: Current Every Day Smoker -- 0.50 packs/day for 28 years    Types: Cigarettes  . Smokeless tobacco: Never Used   . Alcohol Use: No   OB History    No data available     Review of Systems  Constitutional: Positive for appetite change and unexpected weight change.  HENT: Positive for trouble swallowing.   Eyes: Positive for visual disturbance (blurry vision past 2-3 months).  Gastrointestinal: Positive for abdominal pain (diffuse, constant), diarrhea and blood in stool. Negative for nausea, vomiting, constipation and rectal pain.  Genitourinary: Positive for frequency. Negative for hematuria.  Skin: Positive for rash (stomach, right upper leg).  Neurological: Positive for dizziness and light-headedness.  All other systems reviewed and are negative.     Allergies  Codeine  Home Medications   Prior to Admission medications   Medication Sig Start Date End Date Taking? Authorizing Provider  clonazePAM (KLONOPIN) 1 MG tablet Take 1 mg by mouth 3 (three) times daily as needed for anxiety. anxiety 09/14/15   Historical Provider, MD  dexlansoprazole (DEXILANT) 60 MG capsule Take 60 mg by mouth daily.    Historical Provider, MD  dimenhyDRINATE (DRAMAMINE) 50 MG tablet Take 50 mg by mouth every 8 (eight) hours as needed for nausea.    Historical Provider, MD  escitalopram (LEXAPRO) 20 MG tablet Take 20 mg by mouth daily. Reported on 09/17/2015 09/08/15   Historical Provider, MD  ibuprofen (ADVIL,MOTRIN) 800 MG tablet Take 1 tablet (800 mg total) by mouth every 6 (six) hours as needed for mild pain or moderate pain. Patient not taking: Reported on 12/12/2015 09/22/15   Manya Silvas, CNM  ivermectin (STROMECTOL) 3 MG TABS tablet Take 4.5 tablets (13,500 mcg total) by mouth daily. For 2 days 02/03/16 02/26/16  Leone Brand, MD  ondansetron (ZOFRAN) 8 MG tablet Take 1 tablet (8 mg total) by mouth every 8 (eight) hours as needed for nausea or vomiting. Patient not taking: Reported on 12/12/2015 09/17/15   Nat Christen, MD  promethazine (PHENERGAN) 25 MG tablet Take 1 tablet (25 mg total) by mouth every 6 (six)  hours as needed for nausea or vomiting. Patient not taking: Reported on 12/12/2015 09/22/15   Manya Silvas, CNM  traZODone (DESYREL) 50 MG tablet Take 50 mg by mouth at bedtime as needed for sleep.  09/08/15   Historical Provider, MD  zolpidem (AMBIEN) 10 MG tablet Take 10 mg by mouth at bedtime. 09/14/15   Historical Provider, MD   BP 151/107 mmHg  Pulse 70  Temp(Src) 98.1 F (36.7 C) (Oral)  Resp 18  Ht 5' (1.524 m)  Wt 64.32 kg  BMI 27.69 kg/m2  SpO2 99% Physical Exam  Constitutional: She is oriented to person, place, and time. She appears well-developed. No distress.  HENT:  Head: Normocephalic and atraumatic.  Eyes: Conjunctivae and EOM are normal. Pupils are equal, round, and reactive to light.  Neck: Normal range of motion. Neck supple.  Cardiovascular: Normal rate, regular rhythm, normal heart sounds and intact distal pulses.  Exam reveals no gallop and no friction rub.  No murmur heard. Pulmonary/Chest: Effort normal and breath sounds normal. No respiratory distress.  Abdominal: There is generalized tenderness. There is no rigidity, no rebound, no guarding and negative Murphy's sign.  Musculoskeletal: She exhibits no edema or tenderness.       Back:  Mild low back pain to percussion  Neurological: She is alert and oriented to person, place, and time.  Skin: Skin is warm and dry.  Psychiatric: She has a normal mood and affect. Thought content normal.  Nursing note and vitals reviewed.   ED Course  Procedures (including critical care time) Labs Review Labs Reviewed  URINALYSIS, ROUTINE W REFLEX MICROSCOPIC (NOT AT [facility]) - Abnormal; Notable for the following:    Specific Gravity, Urine 1.003 (*)    All other components within normal limits  COMPREHENSIVE METABOLIC PANEL - Abnormal; Notable for the following:    BUN 3 (*)    All other components within normal limits  CBC WITH DIFFERENTIAL/PLATELET - Abnormal; Notable for the following:    Hemoglobin 15.7 (*)    HCT  47.0 (*)    All other components within normal limits  LIPASE, BLOOD  SEDIMENTATION RATE    Imaging Review No results found. I have personally reviewed and evaluated these images and lab results as part of my medical decision-making.   EKG Interpretation None      MDM   Final diagnoses:  Generalized abdominal pain  Diarrhea, unspecified type    CMP, lipase negative, CBC mildly elevated Hgb (with history of the same, known tobacco smoker). Orthostatics negative but will give 1L NS, discussed with patient and given travel history and ongoing pain/diarrhea and offered ivermectin (no albendazole available in ED) which we will give her a printed prescription for. Discussed follow up with GI and PCP.  Leone Brand, MD 02/03/16 [place], DO 02/04/16 1116

## 2016-02-03 NOTE — Discharge Instructions (Signed)
The lab work done today was normal. Your blood pressure did not drop significantly on standing, but we did give you some fluids to help treat this as a possibility.  Please follow up with the GI doctors as you were already referred, and you should still get the stool samples tested.   If your insurance does not cover the Ivermectin (anti-parasite) medicine, you can use the GoodRx app on your phone or go to GoodRx.com and print out a coupon to show the pharmacist.  You can try taking higher doses of imodium if you need to. The max daily dose is 16mg  (8 tablets of the 2mg  tablets you can usually buy over the counter).

## 2016-02-03 NOTE — ED Notes (Signed)
Pt wanted note for traffic court tomorrow, informed pt that the only note we could provide was that she was evaluated in ER today not that she could miss court.

## 2016-02-03 NOTE — ED Notes (Signed)
Complains of ongoing abdominal cramping and abdominal pain x 1 month. States she has nausea, vomiting or diarrhea with any intake. Has lost 40lbs the past 3 months due to same. Complains of weakness due to all the GI complaints

## 2016-02-04 ENCOUNTER — Telehealth: Payer: Self-pay | Admitting: Gastroenterology

## 2016-02-04 ENCOUNTER — Ambulatory Visit (INDEPENDENT_AMBULATORY_CARE_PROVIDER_SITE_OTHER): Payer: Medicare Other | Admitting: Gastroenterology

## 2016-02-04 VITALS — BP 128/72 | HR 76 | Ht 60.0 in | Wt 145.6 lb

## 2016-02-04 DIAGNOSIS — R1084 Generalized abdominal pain: Secondary | ICD-10-CM | POA: Diagnosis not present

## 2016-02-04 DIAGNOSIS — A09 Infectious gastroenteritis and colitis, unspecified: Secondary | ICD-10-CM | POA: Diagnosis not present

## 2016-02-04 DIAGNOSIS — R634 Abnormal weight loss: Secondary | ICD-10-CM | POA: Diagnosis not present

## 2016-02-04 DIAGNOSIS — R197 Diarrhea, unspecified: Secondary | ICD-10-CM | POA: Insufficient documentation

## 2016-02-04 MED ORDER — DICYCLOMINE HCL 10 MG PO CAPS: 10.0000 mg | ORAL_CAPSULE | Freq: Two times a day (BID) | ORAL | Status: DC

## 2016-02-04 NOTE — Progress Notes (Signed)
02/04/2016 ANH WINFREY [id number] 1970/11/23   History of Present Illness:  This is a 45 year old female who is previously known to Dr. Ardis Hughs for complaints of diarrhea which have been presumed to be secondary to IBS. She's not been seen here in almost 2 years at which time she underwent EGD and colonoscopy. These were performed in August 2015. Colonoscopy revealed diverticulum throughout her entire colon and external hemorrhoids. Random biopsies were obtained and showed benign mucosa. EGD revealed normal study. She takes Dexilant daily for her reflux symptoms.  She presents to our office today at the request of her PCP, Dr. Toy Cookey, for evaluation of diarrhea, weight loss, rectal bleeding, and abdominal pain. The patient is here with her husband and they tell me that she's had severe diarrhea and these other symptoms for the past 2-3 months. She's had severe diarrhea 10-15 times a day at minimum. She says every time she put something in her mouth she has diarrhea.  She does see some bright red blood with bowel movements at times. She has diffuse generalized abdominal pain/cramping. Denies any fevers. She reports an associated 40 pounds weight loss over these 2 months as well. They tell me that really no evaluation has been performed and that she has just been treated empirically with a ten-day course of Augmentin, but the diarrhea and symptoms returned shortly after discontinuing that. She is currently on another 14 day course of Augmentin along with a course of ivermectin that was prescribed from the ER physician when she was seen there yesterday. She complains that she feels dehydrated, but her labs did not show any metabolic or electrolyte derangements.  This has been felt to be infectious because [place], stopped in [place] on a cruise in December, and was in [place] recently. She says that she was already sick when she went to [place], however. She's been very  distressed by this and is concerned and is tearful in our office today. We do also have an office note from November 2016 from her PCPs office and it appears she was complaining of diarrhea at that time and was treated with a course of Cipro and Flagyl then as well.  Current Medications, Allergies, Past Medical History, Past Surgical History, Family History and Social History were reviewed in [employer] record.   Physical Exam: BP 128/72 mmHg  Pulse 76  Ht 5' (1.524 m)  Wt 145 lb 9.6 oz (66.044 kg)  BMI 28.44 kg/m2 General: Well developed white female in no acute distress Head: Normocephalic and atraumatic Eyes:  Sclerae anicteric, conjunctiva pink  Ears: Normal auditory acuity Lungs: Clear throughout to auscultation Heart: Regular rate and rhythm Abdomen: Soft, non-distended.  Normal bowel sounds.  Diffuse TTP > on right side. Musculoskeletal: Symmetrical with no gross deformities  Extremities: No edema  Neurological: Alert oriented x 4, grossly non-focal Psychological:  Alert and cooperative. Normal mood and affect  Assessment and Recommendations: -45 year old female with chronic GI complaints including diarrhea which has been presumed to be IBS.  Now with acute to subacute worsening of her diarrhea associated with rectal bleeding, weight loss of 40 pounds, and diffuse abdominal pain. He was empirically treated with Cipro/Flagyl and Augmentin previously. Currently on the second course of Augmentin and empiric ivermectin.  We will check stool GI pathogen panel and O&P study. Will order CT scan abdomen and pelvis with contrast as well. We'll give dicyclomine 10 mg to take twice daily for symptomatic  treatment in the interim.

## 2016-02-04 NOTE — Telephone Encounter (Signed)
Patient has a 40 lb weight loss, abdominal pain, and diarrhea. Was in the ED last night and started on ivermectin. She will come in today and see Alonza Bogus, PA at 2:00

## 2016-02-04 NOTE — Patient Instructions (Signed)
Your physician has requested that you go to the basement for the following lab work before leaving today: Stool ova and parasite and GI pathogen panel.  We have sent the following medications to your pharmacy for you to pick up at your convenience:Bentyl.   You have been scheduled for a CT scan of the abdomen and pelvis at [facility] ([address]---this is in the same building as [facility]).   You are scheduled on 02/10/16 at 2:00pm. You should arrive 15 minutes prior to your appointment time for registration. Please follow the written instructions below on the day of your exam:  WARNING: IF YOU ARE ALLERGIC TO IODINE/X-RAY DYE, PLEASE NOTIFY RADIOLOGY IMMEDIATELY AT [phone]! YOU WILL BE GIVEN A 13 HOUR PREMEDICATION PREP.  1) Do not eat or drink anything after 10:00am (4 hours prior to your test) 2) You have been given 2 bottles of oral contrast to drink. The solution may taste better if refrigerated, but do NOT add ice or any other liquid to this solution. Shake well before drinking.    Drink 1 bottle of contrast @ 12:00pm (2 hours prior to your exam)  Drink 1 bottle of contrast @ 1:00pm (1 hour prior to your exam)  You may take any medications as prescribed with a small amount of water except for the following: Metformin, Glucophage, Glucovance, Avandamet, Riomet, Fortamet, Actoplus Met, Janumet, Glumetza or Metaglip. The above medications must be held the day of the exam AND 48 hours after the exam.  The purpose of you drinking the oral contrast is to aid in the visualization of your intestinal tract. The contrast solution may cause some diarrhea. Before your exam is started, you will be given a small amount of fluid to drink. Depending on your individual set of symptoms, you may also receive an intravenous injection of x-ray contrast/dye. Plan on being at [facility] for 30 minutes or longer, depending on the type of exam you are having performed.  This  test typically takes 30-45 minutes to complete.  If you have any questions regarding your exam or if you need to reschedule, you may call the CT department at [phone] between the hours of 8:00 am and 5:00 pm, Monday-Friday.  ________________________________________________________________________

## 2016-02-05 NOTE — Progress Notes (Signed)
i agree with the above note, plan 

## 2016-02-08 ENCOUNTER — Other Ambulatory Visit: Payer: Medicare Other

## 2016-02-08 DIAGNOSIS — R197 Diarrhea, unspecified: Secondary | ICD-10-CM

## 2016-02-08 DIAGNOSIS — R634 Abnormal weight loss: Secondary | ICD-10-CM

## 2016-02-08 DIAGNOSIS — R1084 Generalized abdominal pain: Secondary | ICD-10-CM

## 2016-02-09 LAB — GASTROINTESTINAL PATHOGEN PANEL PCR
C. difficile Tox A/B, PCR: NOT DETECTED
Campylobacter, PCR: NOT DETECTED
Cryptosporidium, PCR: NOT DETECTED
E COLI (ETEC) LT/ST, PCR: NOT DETECTED
E COLI (STEC) STX1/STX2, PCR: NOT DETECTED
E coli 0157, PCR: NOT DETECTED
Giardia lamblia, PCR: NOT DETECTED
NOROVIRUS, PCR: NOT DETECTED
ROTAVIRUS, PCR: NOT DETECTED
SALMONELLA, PCR: NOT DETECTED
SHIGELLA, PCR: NOT DETECTED

## 2016-02-09 LAB — OVA AND PARASITE EXAMINATION: OP: NONE SEEN

## 2016-02-10 ENCOUNTER — Ambulatory Visit (INDEPENDENT_AMBULATORY_CARE_PROVIDER_SITE_OTHER)
Admission: RE | Admit: 2016-02-10 | Discharge: 2016-02-10 | Disposition: A | Discharge status: Home / Self Care | Payer: Medicare Other | Source: Ambulatory Visit | Attending: Gastroenterology | Admitting: Gastroenterology

## 2016-02-10 DIAGNOSIS — R1084 Generalized abdominal pain: Secondary | ICD-10-CM

## 2016-02-10 DIAGNOSIS — R197 Diarrhea, unspecified: Secondary | ICD-10-CM

## 2016-02-10 DIAGNOSIS — R634 Abnormal weight loss: Secondary | ICD-10-CM | POA: Diagnosis not present

## 2016-02-10 DIAGNOSIS — A09 Infectious gastroenteritis and colitis, unspecified: Secondary | ICD-10-CM | POA: Diagnosis not present

## 2016-02-10 MED ORDER — IOPAMIDOL (ISOVUE-300) INJECTION 61%
100.0000 mL | Freq: Once | INTRAVENOUS | Status: AC | PRN
  Administered 2016-02-10: 100 mL via INTRAVENOUS

## 2016-02-11 ENCOUNTER — Telehealth: Payer: Self-pay | Admitting: Gastroenterology

## 2016-02-11 NOTE — Telephone Encounter (Signed)
Pt states she has lost 40pounds in a couple of months. States she is very concerned and wants to know what the next step will be in her care. She wanted to know if since she was given Ivermectin from the ER if that may have altered her stool results. Reviewed results with pt that nothing was detected. Pt wants to know what the "little white things" were in her stool then. Let pt know that the stool was examined and nothing was found. Pt upset. Please advise.

## 2016-02-12 ENCOUNTER — Other Ambulatory Visit (INDEPENDENT_AMBULATORY_CARE_PROVIDER_SITE_OTHER): Payer: Medicare Other

## 2016-02-12 ENCOUNTER — Telehealth: Payer: Self-pay | Admitting: Gastroenterology

## 2016-02-12 ENCOUNTER — Other Ambulatory Visit: Payer: Self-pay

## 2016-02-12 DIAGNOSIS — R197 Diarrhea, unspecified: Secondary | ICD-10-CM

## 2016-02-12 LAB — IGA: IgA: 241 mg/dL (ref 68–378)

## 2016-02-12 LAB — TSH: TSH: 0.55 u[IU]/mL (ref 0.35–4.50)

## 2016-02-12 MED ORDER — CHOLESTYRAMINE 4 G PO PACK: 4.0000 g | PACK | Freq: Two times a day (BID) | ORAL | Status: DC

## 2016-02-12 NOTE — Telephone Encounter (Signed)
See note from 02/11/16, sent to Alonza Bogus for review.

## 2016-02-16 LAB — GLIADIN IGA+TTG IGA
Antigliadin Abs, IgA: 5 units (ref 0–19)
Transglutaminase IgA: <2 U/mL (ref 0–3)

## 2016-02-18 ENCOUNTER — Encounter: Payer: Self-pay | Admitting: Gastroenterology

## 2016-02-19 ENCOUNTER — Telehealth: Payer: Self-pay | Admitting: *Deleted

## 2016-02-19 NOTE — Telephone Encounter (Signed)
Sent patient a pt.advice note requesting she call us to schedule colonoscopy and return stool study.

## 2016-02-19 NOTE — Telephone Encounter (Signed)
Spoke with patient and she will bring stool study back. Scheduled for colonoscopy on 04/15/16 at 4:00PM with Dr. Ardis Hughs. and pre visit on 04/08/16 at 1:00 pm.

## 2016-02-19 NOTE — Telephone Encounter (Signed)
had asked that she be set up for colonoscopy but does not look like that was done. Please schedule one for her. Let her know that the other labs were normal/negative but I am waiting for the fecal elastase as it does not look like she turned it in so please have her do that as well.         Thanks,        Jess         ----- Message -----     From: Gus Rankin     Sent: 02/18/2016  7:59 AM      To: Loralie Champagne, PA-C    Subject: Non-Urgent Medical Question                 ----- Message from Hulan Saas, RN sent at 02/18/2016 7:59 AM EDT -----            ----- Message from Gus Rankin to Loralie Champagne, PA-C sent at 02/18/2016 12:58 AM -----     I do not know what to do at this point. My symptoms are not alleviating, if anything it has gotten worse. I am so lost and scared. What else can I do to figure out what is causing these symptoms?     Thank you.     Gaylynn Lipinski   Unable to reach due to no mailbox set up. Will try again later.

## 2016-02-20 ENCOUNTER — Emergency Department (HOSPITAL_BASED_OUTPATIENT_CLINIC_OR_DEPARTMENT_OTHER)
Admission: EM | Admit: 2016-02-20 | Discharge: 2016-02-20 | Disposition: A | Discharge status: Home / Self Care | Payer: Medicare Other | Attending: Emergency Medicine | Admitting: Emergency Medicine

## 2016-02-20 ENCOUNTER — Encounter (HOSPITAL_BASED_OUTPATIENT_CLINIC_OR_DEPARTMENT_OTHER): Payer: Self-pay | Admitting: *Deleted

## 2016-02-20 DIAGNOSIS — N3000 Acute cystitis without hematuria: Secondary | ICD-10-CM | POA: Diagnosis not present

## 2016-02-20 DIAGNOSIS — L089 Local infection of the skin and subcutaneous tissue, unspecified: Secondary | ICD-10-CM

## 2016-02-20 DIAGNOSIS — L0231 Cutaneous abscess of buttock: Secondary | ICD-10-CM | POA: Diagnosis not present

## 2016-02-20 DIAGNOSIS — M1712 Unilateral primary osteoarthritis, left knee: Secondary | ICD-10-CM | POA: Insufficient documentation

## 2016-02-20 DIAGNOSIS — F329 Major depressive disorder, single episode, unspecified: Secondary | ICD-10-CM | POA: Diagnosis not present

## 2016-02-20 DIAGNOSIS — F17210 Nicotine dependence, cigarettes, uncomplicated: Secondary | ICD-10-CM | POA: Diagnosis not present

## 2016-02-20 LAB — URINALYSIS, ROUTINE W REFLEX MICROSCOPIC
Bilirubin Urine: NEGATIVE
Glucose, UA: NEGATIVE mg/dL
KETONES UR: NEGATIVE mg/dL
Nitrite: NEGATIVE
PROTEIN: NEGATIVE mg/dL
Specific Gravity, Urine: 1.004 — ABNORMAL LOW (ref 1.005–1.030)
pH: 6.5 (ref 5.0–8.0)

## 2016-02-20 LAB — CBC WITH DIFFERENTIAL/PLATELET
BASOS ABS: 0 10*3/uL (ref 0.0–0.1)
Basophils Relative: 0 %
EOS ABS: 0.1 10*3/uL (ref 0.0–0.7)
EOS PCT: 2 %
HCT: 45.8 % (ref 36.0–46.0)
Hemoglobin: 15.2 g/dL — ABNORMAL HIGH (ref 12.0–15.0)
LYMPHS PCT: 20 %
Lymphs Abs: 1.5 10*3/uL (ref 0.7–4.0)
MCH: 31.3 pg (ref 26.0–34.0)
MCHC: 33.2 g/dL (ref 30.0–36.0)
MCV: 94.4 fL (ref 78.0–100.0)
MONO ABS: 0.5 10*3/uL (ref 0.1–1.0)
Monocytes Relative: 6 %
Neutro Abs: 5.3 10*3/uL (ref 1.7–7.7)
Neutrophils Relative %: 72 %
PLATELETS: 301 10*3/uL (ref 150–400)
RBC: 4.85 MIL/uL (ref 3.87–5.11)
RDW: 12.7 % (ref 11.5–15.5)
WBC: 7.3 10*3/uL (ref 4.0–10.5)

## 2016-02-20 LAB — URINE MICROSCOPIC-ADD ON

## 2016-02-20 LAB — BASIC METABOLIC PANEL
ANION GAP: 8 (ref 5–15)
BUN: 5 mg/dL — ABNORMAL LOW (ref 6–20)
CALCIUM: 8.9 mg/dL (ref 8.9–10.3)
CO2: 27 mmol/L (ref 22–32)
Chloride: 102 mmol/L (ref 101–111)
Creatinine, Ser: 0.8 mg/dL (ref 0.44–1.00)
Glucose, Bld: 96 mg/dL (ref 65–99)
POTASSIUM: 4.2 mmol/L (ref 3.5–5.1)
SODIUM: 137 mmol/L (ref 135–145)

## 2016-02-20 MED ORDER — SULFAMETHOXAZOLE-TRIMETHOPRIM 800-160 MG PO TABS
2.0000 | ORAL_TABLET | Freq: Two times a day (BID) | ORAL | Status: AC
Start: 2016-02-20 — End: 2016-02-27

## 2016-02-20 NOTE — ED Notes (Signed)
Pt not in room at this time. Belongings noted at bedside

## 2016-02-20 NOTE — ED Provider Notes (Signed)
CSN: [id number]     Arrival date & time 02/20/16  1401 History   First MD Initiated Contact with Patient 02/20/16 1800     Chief Complaint  Patient presents with  . Abscess     (Consider location/radiation/quality/duration/timing/severity/associated sxs/prior Treatment) HPI Patient presents to the emergency department with pustules and abscesses on her buttocks.  The patient states that she has been recently treated with ivermectin for a parasitic infection.  The patient states since that time.  Her abuse systems seems to be out of whack.  The patient states that nothing seems make the condition better, but sitting and palpation make the pain worse.  She states that she has noticed real 4 areas. The patient denies chest pain, shortness of breath, headache,blurred vision, neck pain, fever, cough, weakness, numbness, dizziness, anorexia, edema, abdominal pain, nausea, vomiting, diarrhea, rash, back pain, dysuria, hematemesis, bloody stool, near syncope, or syncope. Past Medical History  Diagnosis Date  . GERD (gastroesophageal reflux disease)   . IBS (irritable bowel syndrome)     no current med.  . Anxiety   . Depression   . Arthritis     left knee  . Erosive esophagitis   . Diverticulosis   . 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 Knee arthroscopy  2011    left  . Wisdom tooth extraction    . Hysteroscopy w/d&c  06/22/2012    Procedure: DILATATION AND CURETTAGE /HYSTEROSCOPY;  Surgeon: Lahoma Crocker, MD;  Location: [facility];  Service: Gynecology;  Laterality: N/A;  . Cholecystectomy  08/02/2012    Procedure: LAPAROSCOPIC CHOLECYSTECTOMY WITH INTRAOPERATIVE CHOLANGIOGRAM;  Surgeon: Rolm Bookbinder, MD;  Location: [facility];  Service: General;  Laterality: N/A;  . Colonoscopy with propofol  04/09/2014  . Esophagogastroduodenoscopy (egd) with propofol  04/09/2014  . Orif toe fracture Left 11/05/2014      Procedure: OPEN REDUCTION INTERNAL FIXATION (ORIF) LEFT FIFTH METATARSAL (TOE) FRACTURE WITH CALCANEAL BONE GRAFT;  Surgeon: Dorna Leitz, MD;  Location: [facility];  Service: Orthopedics;  Laterality: Left;   Family History  Problem Relation Age of Onset  . Heart disease Father   . Stroke Father   . Cancer - Colon Maternal Grandfather   . Stomach cancer Paternal Grandmother   . Cancer - Ovarian Mother   . Cancer - Ovarian Sister    Social History  Substance Use Topics  . Smoking status: Current Every Day Smoker -- 0.50 packs/day for 28 years    Types: Cigarettes  . Smokeless tobacco: Never Used     Comment: form given 02-04-16   . Alcohol Use: No   OB History    No data available     Review of Systems   All other systems negative except as documented in the HPI. All pertinent positives and negatives as reviewed in the HPI. Allergies  Codeine  Home Medications   Prior to Admission medications   Medication Sig Start Date End Date Taking? Authorizing Provider  cholestyramine (QUESTRAN) 4 g packet Take 1 packet (4 g total) by mouth 2 (two) times daily. 02/12/16   Jessica D Zehr, PA-C  clonazePAM (KLONOPIN) 1 MG tablet Take 1 mg by mouth 3 (three) times daily as needed for anxiety. anxiety 09/14/15   Historical Provider, MD  dexlansoprazole (DEXILANT) 60 MG capsule Take 60 mg by mouth daily.    Historical Provider, MD  dicyclomine (BENTYL) 10 MG capsule  Take 1 capsule (10 mg total) by mouth 2 (two) times daily. 02/04/16   Jessica D Zehr, PA-C  dimenhyDRINATE (DRAMAMINE) 50 MG tablet Take 50 mg by mouth every 8 (eight) hours as needed for nausea.    Historical Provider, MD  escitalopram (LEXAPRO) 20 MG tablet Take 20 mg by mouth daily. Reported on 09/17/2015 09/08/15   Historical Provider, MD  ivermectin (STROMECTOL) 3 MG TABS tablet Take 4.5 tablets (13,500 mcg total) by mouth daily. For 2 days 02/03/16 02/26/16  Leone Brand, MD  promethazine (PHENERGAN) 25 MG tablet  Take 1 tablet (25 mg total) by mouth every 6 (six) hours as needed for nausea or vomiting. 09/22/15   Manya Silvas, CNM  traZODone (DESYREL) 50 MG tablet Take 50 mg by mouth at bedtime as needed for sleep.  09/08/15   Historical Provider, MD  zolpidem (AMBIEN) 10 MG tablet Take 10 mg by mouth at bedtime. 09/14/15   Historical Provider, MD   BP 130/96 mmHg  Pulse 86  Temp(Src) 98.2 F (36.8 C) (Oral)  Resp 18  Ht 5\' 1"  (1.549 m)  Wt 63.458 kg  BMI 26.45 kg/m2  SpO2 97% Physical Exam  Constitutional: She is oriented to person, place, and time. She appears well-developed and well-nourished. No distress.  HENT:  Head: Normocephalic and atraumatic.  Mouth/Throat: Oropharynx is clear and moist.  Cardiovascular: Normal rate, regular rhythm and normal heart sounds.  Exam reveals no gallop and no friction rub.   No murmur heard. Pulmonary/Chest: Effort normal and breath sounds normal. No respiratory distress.  Neurological: She is alert and oriented to person, place, and time. She exhibits normal muscle tone. Coordination normal.  Skin:       ED Course  Procedures (including critical care time) Labs Review Labs Reviewed  CBC WITH DIFFERENTIAL/PLATELET - Abnormal; Notable for the following:    Hemoglobin 15.2 (*)    All other components within normal limits  BASIC METABOLIC PANEL - Abnormal; Notable for the following:    BUN 5 (*)    All other components within normal limits  URINALYSIS, ROUTINE W REFLEX MICROSCOPIC (NOT AT [facility]) - Abnormal; Notable for the following:    APPearance TURBID (*)    Specific Gravity, Urine 1.004 (*)    Hgb urine dipstick MODERATE (*)    Leukocytes, UA LARGE (*)    All other components within normal limits  URINE MICROSCOPIC-ADD ON - Abnormal; Notable for the following:    Squamous Epithelial / LPF 0-5 (*)    Bacteria, UA MANY (*)    All other components within normal limits    Imaging Review No results found. I have personally reviewed and  evaluated these images and lab results as part of my medical decision-making.   EKG Interpretation None      I used an 18-gauge needle to open the top layer of the small pustules to drain the small amount of pus from each.  The larger abscess is draining and open, therefore no other care was performed to this area.  Told the patient to use warm bath soaks and follow up with her primary care doctor.  Patient agrees the plan and all questions were answered.  Patient will be treated for UTI given Septra  Dalia Heading, PA-C 02/20/16 [place], MD 02/20/16 2318

## 2016-02-20 NOTE — ED Notes (Signed)
Pt was in [place] 06/28/2015-07/05/2015 and has been unwell since and has lost over 40lbs since.  Pt has had GI upset and has been staying nauseated since.  Pt was also in [place] in December.  Pt was placed on Ivermectin on 6/7 and gave a stool sample.  Pt states that she saw "clearly visibe" parasites (long skinny black as well as white about one inch long white ones).  Pt was told that the stool sample was negative for parasites.  Pt now has a new symptoms which is concerning- postules on her buttock, white tipped with red surrounding area, very painful.

## 2016-02-20 NOTE — Discharge Instructions (Signed)
Return here as needed.  Follow-up your primary care doctor.  The areas clean and dry, soak in warm tub with Epsom salts

## 2016-03-02 ENCOUNTER — Encounter (HOSPITAL_COMMUNITY): Payer: Self-pay | Admitting: *Deleted

## 2016-03-02 ENCOUNTER — Observation Stay (HOSPITAL_COMMUNITY)
Admission: AD | Admit: 2016-03-02 | Discharge: 2016-03-03 | Disposition: A | Discharge status: Home / Self Care | Payer: Medicare Other | Source: Ambulatory Visit | Attending: Internal Medicine | Admitting: Internal Medicine

## 2016-03-02 DIAGNOSIS — Z79899 Other long term (current) drug therapy: Secondary | ICD-10-CM | POA: Insufficient documentation

## 2016-03-02 DIAGNOSIS — L03317 Cellulitis of buttock: Secondary | ICD-10-CM | POA: Diagnosis present

## 2016-03-02 DIAGNOSIS — F329 Major depressive disorder, single episode, unspecified: Secondary | ICD-10-CM | POA: Diagnosis not present

## 2016-03-02 DIAGNOSIS — L0390 Cellulitis, unspecified: Secondary | ICD-10-CM

## 2016-03-02 DIAGNOSIS — F17210 Nicotine dependence, cigarettes, uncomplicated: Secondary | ICD-10-CM | POA: Insufficient documentation

## 2016-03-02 DIAGNOSIS — R52 Pain, unspecified: Secondary | ICD-10-CM

## 2016-03-02 LAB — CBC WITH DIFFERENTIAL/PLATELET
BASOS PCT: 1 %
Basophils Absolute: 0 10*3/uL (ref 0.0–0.1)
EOS ABS: 0.1 10*3/uL (ref 0.0–0.7)
Eosinophils Relative: 3 %
HEMATOCRIT: 42.6 % (ref 36.0–46.0)
HEMOGLOBIN: 14.3 g/dL (ref 12.0–15.0)
LYMPHS ABS: 1.4 10*3/uL (ref 0.7–4.0)
Lymphocytes Relative: 34 %
MCH: 31 pg (ref 26.0–34.0)
MCHC: 33.6 g/dL (ref 30.0–36.0)
MCV: 92.4 fL (ref 78.0–100.0)
MONO ABS: 0.2 10*3/uL (ref 0.1–1.0)
MONOS PCT: 4 %
NEUTROS PCT: 58 %
Neutro Abs: 2.4 10*3/uL (ref 1.7–7.7)
PLATELETS: 213 10*3/uL (ref 150–400)
RBC: 4.61 MIL/uL (ref 3.87–5.11)
RDW: 13.4 % (ref 11.5–15.5)
WBC: 4.1 10*3/uL (ref 4.0–10.5)

## 2016-03-02 LAB — BASIC METABOLIC PANEL
Anion gap: 8 (ref 5–15)
BUN: 8 mg/dL (ref 6–20)
CALCIUM: 8.6 mg/dL — AB (ref 8.9–10.3)
CHLORIDE: 104 mmol/L (ref 101–111)
CO2: 26 mmol/L (ref 22–32)
CREATININE: 0.82 mg/dL (ref 0.44–1.00)
GFR calc non Af Amer: >60 mL/min (ref >60)
GLUCOSE: 120 mg/dL — AB (ref 65–99)
Potassium: 3.6 mmol/L (ref 3.5–5.1)
Sodium: 138 mmol/L (ref 135–145)

## 2016-03-02 MED ORDER — BACITRACIN-NEOMYCIN-POLYMYXIN 400-5-5000 EX OINT
TOPICAL_OINTMENT | Freq: Three times a day (TID) | CUTANEOUS | Status: DC
  Administered 2016-03-02 – 2016-03-03 (×2): 1 via TOPICAL
  Filled 2016-03-02 (×2): qty 1

## 2016-03-02 MED ORDER — SODIUM CHLORIDE 0.9% FLUSH
3.0000 mL | Freq: Two times a day (BID) | INTRAVENOUS | Status: DC
  Administered 2016-03-02 – 2016-03-03 (×2): 3 mL via INTRAVENOUS

## 2016-03-02 MED ORDER — TRAZODONE HCL 50 MG PO TABS: 50.0000 mg | ORAL_TABLET | Freq: Every evening | ORAL | Status: DC | PRN

## 2016-03-02 MED ORDER — DIMENHYDRINATE 50 MG PO TABS
50.0000 mg | ORAL_TABLET | Freq: Three times a day (TID) | ORAL | Status: DC | PRN
  Filled 2016-03-02: qty 1

## 2016-03-02 MED ORDER — PANTOPRAZOLE SODIUM 40 MG PO TBEC
40.0000 mg | DELAYED_RELEASE_TABLET | Freq: Every day | ORAL | Status: DC
  Administered 2016-03-02 – 2016-03-03 (×2): 40 mg via ORAL
  Filled 2016-03-02 (×2): qty 1

## 2016-03-02 MED ORDER — ESCITALOPRAM OXALATE 10 MG PO TABS
20.0000 mg | ORAL_TABLET | Freq: Every day | ORAL | Status: DC
  Administered 2016-03-02 – 2016-03-03 (×2): 20 mg via ORAL
  Filled 2016-03-02 (×2): qty 2

## 2016-03-02 MED ORDER — SODIUM CHLORIDE 0.9% FLUSH: 3.0000 mL | INTRAVENOUS | Status: DC | PRN

## 2016-03-02 MED ORDER — CLONAZEPAM 1 MG PO TABS: 1.0000 mg | ORAL_TABLET | Freq: Three times a day (TID) | ORAL | Status: DC | PRN

## 2016-03-02 MED ORDER — SODIUM CHLORIDE 0.9 % IV SOLN: 250.0000 mL | INTRAVENOUS | Status: DC | PRN

## 2016-03-02 MED ORDER — CHOLESTYRAMINE 4 G PO PACK
4.0000 g | PACK | Freq: Two times a day (BID) | ORAL | Status: DC
  Administered 2016-03-02 – 2016-03-03 (×2): 4 g via ORAL
  Filled 2016-03-02 (×2): qty 1

## 2016-03-02 MED ORDER — ENOXAPARIN SODIUM 40 MG/0.4ML ~~LOC~~ SOLN
40.0000 mg | SUBCUTANEOUS | Status: DC
  Administered 2016-03-02: 40 mg via SUBCUTANEOUS
  Filled 2016-03-02: qty 0.4

## 2016-03-02 MED ORDER — IBUPROFEN 200 MG PO TABS
400.0000 mg | ORAL_TABLET | Freq: Three times a day (TID) | ORAL | Status: DC | PRN
  Administered 2016-03-02: 400 mg via ORAL
  Filled 2016-03-02: qty 2

## 2016-03-02 MED ORDER — PROMETHAZINE HCL 25 MG PO TABS
25.0000 mg | ORAL_TABLET | Freq: Four times a day (QID) | ORAL | Status: DC | PRN
  Administered 2016-03-02: 25 mg via ORAL
  Filled 2016-03-02: qty 1

## 2016-03-02 MED ORDER — DICYCLOMINE HCL 10 MG PO CAPS
10.0000 mg | ORAL_CAPSULE | Freq: Two times a day (BID) | ORAL | Status: DC
  Administered 2016-03-02 – 2016-03-03 (×2): 10 mg via ORAL
  Filled 2016-03-02 (×3): qty 1

## 2016-03-02 MED ORDER — ZOLPIDEM TARTRATE 5 MG PO TABS
5.0000 mg | ORAL_TABLET | Freq: Every day | ORAL | Status: DC
  Administered 2016-03-02: 5 mg via ORAL
  Filled 2016-03-02: qty 1

## 2016-03-02 NOTE — H&P (Addendum)
History and Physical    [name] [id number] DOB: 05/21/1971 DOA: 03/02/2016  PCP: Delia Chimes, NP   Patient coming from: Home as direct admission from primary care office.  Chief Complaint: Gluteal pain and cellulitis  HPI: [name] is a 45 y.o. female who comes in as a direct admission for evaluation for gluteal cellulitis. Patient developed a small abscess on her right gluteal region about 14 days ago. Then she was seen at the emergency department where her lesion was drained and she was started on Bactrim. Her skin lesion had slowly improved but other lesions appear in the other gluteal region, being tender, associated with erythema, no improving or worsening factors. About 5 days ago she was seen by her primary care provider who prescribed doxycycline and for last 5 days she has been taking both antibiotics Bactrim and doxycycline. She completed 14 days of Bactrim within  the last 24 hours. Today due to persistent pain in the gluteal region she called her primary care physician who will refer her to the hospital for further evaluation. She tested positive for MRSA on June 29.  ED Course: direct admission.  Review of Systems:  Gen. No fevers or chills waking of weight loss Cardiovascular. No angina, claudication or syncope Pulmonary. No shortness of breath cough or hemoptysis Gastrointestinal. No nausea vomiting or diarrhea Musculoskeletal. no joint pain. Dermatology positive for rashes as mentioned in history present illness Urology no dysuria or increased urinary frequency Hematology no easy bruisability or frequent infections Psych no depression or anxiety Neurology no seizures or paresthesias  Past Medical History  Diagnosis Date  . GERD (gastroesophageal reflux disease)   . IBS (irritable bowel syndrome)     no current med.  . Anxiety   . Depression   . Arthritis     left knee  . Erosive esophagitis   . Diverticulosis   . Fracture of metatarsal bone  with nonunion 10/2014    left 5th metatarsal    Past Surgical History  Procedure Laterality Date  . Knee arthroscopy  2011    left  . Wisdom tooth extraction    . Hysteroscopy w/d&c  06/22/2012    Procedure: DILATATION AND CURETTAGE /HYSTEROSCOPY;  Surgeon: Lahoma Crocker, MD;  Location: [facility];  Service: Gynecology;  Laterality: N/A;  . Cholecystectomy  08/02/2012    Procedure: LAPAROSCOPIC CHOLECYSTECTOMY WITH INTRAOPERATIVE CHOLANGIOGRAM;  Surgeon: Rolm Bookbinder, MD;  Location: [facility];  Service: General;  Laterality: N/A;  . Colonoscopy with propofol  04/09/2014  . Esophagogastroduodenoscopy (egd) with propofol  04/09/2014  . Orif toe fracture Left 11/05/2014    Procedure: OPEN REDUCTION INTERNAL FIXATION (ORIF) LEFT FIFTH METATARSAL (TOE) FRACTURE WITH CALCANEAL BONE GRAFT;  Surgeon: Dorna Leitz, MD;  Location: [place];  Service: Orthopedics;  Laterality: Left;     reports that she has been smoking Cigarettes.  She has a 14 pack-year smoking history. She has never used smokeless tobacco. She reports that she uses illicit drugs (Marijuana). She reports that she does not drink alcohol.  Allergies  Allergen Reactions  . Codeine Nausea And Vomiting    "Pt can take Vicodin if given with promethazine"    Family History  Problem Relation Age of Onset  . Heart disease Father   . Stroke Father   . Cancer - Colon Maternal Grandfather   . Stomach cancer Paternal Grandmother   . Cancer - Ovarian Mother   . Cancer - Ovarian Sister     Prior to Admission  medications   Medication Sig Start Date End Date Taking? Authorizing Provider  cholestyramine (QUESTRAN) 4 g packet Take 1 packet (4 g total) by mouth 2 (two) times daily. 02/12/16   Jessica D Zehr, PA-C  clonazePAM (KLONOPIN) 1 MG tablet Take 1 mg by mouth 3 (three) times daily as needed for anxiety. anxiety 09/14/15   Historical Provider, MD  dexlansoprazole (DEXILANT) 60 MG capsule Take 60 mg by mouth daily.     Historical Provider, MD  dicyclomine (BENTYL) 10 MG capsule Take 1 capsule (10 mg total) by mouth 2 (two) times daily. 02/04/16   Jessica D Zehr, PA-C  dimenhyDRINATE (DRAMAMINE) 50 MG tablet Take 50 mg by mouth every 8 (eight) hours as needed for nausea.    Historical Provider, MD  escitalopram (LEXAPRO) 20 MG tablet Take 20 mg by mouth daily. Reported on 09/17/2015 09/08/15   Historical Provider, MD  promethazine (PHENERGAN) 25 MG tablet Take 1 tablet (25 mg total) by mouth every 6 (six) hours as needed for nausea or vomiting. 09/22/15   Manya Silvas, CNM  traZODone (DESYREL) 50 MG tablet Take 50 mg by mouth at bedtime as needed for sleep.  09/08/15   Historical Provider, MD  zolpidem (AMBIEN) 10 MG tablet Take 10 mg by mouth at bedtime. 09/14/15   Historical Provider, MD    Physical Exam: Filed Vitals:   03/02/16 1614  BP: 120/82  Pulse: 78  Temp: 97.8 F (36.6 C)  TempSrc: Oral  Resp: 18  Height: 5\' 1"  (1.549 m)  Weight: 63.05 kg (139 lb)  SpO2: 100%      Constitutional: NAD, calm, comfortable Filed Vitals:   03/02/16 1614  BP: 120/82  Pulse: 78  Temp: 97.8 F (36.6 C)  TempSrc: Oral  Resp: 18  Height: 5\' 1"  (1.549 m)  Weight: 63.05 kg (139 lb)  SpO2: 100%   Eyes: PERRL, lids and conjunctivae normal ENMT: Mucous membranes are moist. Posterior pharynx clear of any exudate or lesions.Normal dentition.  Neck: normal, supple, no masses, no thyromegaly Respiratory: clear to auscultation bilaterally, no wheezing, no crackles. Normal respiratory effort. No accessory muscle use.  Cardiovascular: Regular rate and rhythm, no murmurs / rubs / gallops. No extremity edema. 2+ pedal pulses. No carotid bruits.  Abdomen: no tenderness, no masses palpated. No hepatosplenomegaly. Bowel sounds positive.  Musculoskeletal: no clubbing / cyanosis. No joint deformity upper and lower extremities. Good ROM, no contractures. Normal muscle tone.  Skin: Left Gluteal region, inner lower quadrant she  does have on the  small nodule about 1 cm which is ulcerated, does not have any drainage or significant erythema in the surrounding regions. She has healing ulcerated lesions on the right gluteal region Neurologic: CN 2-12 grossly intact. Sensation intact, DTR normal. Strength 5/5 in all 4.  Psychiatric: Normal judgment and insight. Alert and oriented x 3. Normal mood.   Labs on Admission: I have personally reviewed following labs and imaging studies  CBC: No results for input(s): WBC, NEUTROABS, HGB, HCT, MCV, PLT in the last 168 hours. Basic Metabolic Panel: No results for input(s): NA, K, CL, CO2, GLUCOSE, BUN, CREATININE, CALCIUM, MG, PHOS in the last 168 hours. GFR: Estimated Creatinine Clearance: 76.4 mL/min (by C-G formula based on Cr of 0.8). Liver Function Tests: No results for input(s): AST, ALT, ALKPHOS, BILITOT, PROT, ALBUMIN in the last 168 hours. No results for input(s): LIPASE, AMYLASE in the last 168 hours. No results for input(s): AMMONIA in the last 168 hours. Coagulation Profile: No results for  input(s): INR, PROTIME in the last 168 hours. Cardiac Enzymes: No results for input(s): CKTOTAL, CKMB, CKMBINDEX, TROPONINI in the last 168 hours. BNP (last 3 results) No results for input(s): PROBNP in the last 8760 hours. HbA1C: No results for input(s): HGBA1C in the last 72 hours. CBG: No results for input(s): GLUCAP in the last 168 hours. Lipid Profile: No results for input(s): CHOL, HDL, LDLCALC, TRIG, CHOLHDL, LDLDIRECT in the last 72 hours. Thyroid Function Tests: No results for input(s): TSH, T4TOTAL, FREET4, T3FREE, THYROIDAB in the last 72 hours. Anemia Panel: No results for input(s): VITAMINB12, FOLATE, FERRITIN, TIBC, IRON, RETICCTPCT in the last 72 hours. Urine analysis:    Component Value Date/Time   COLORURINE YELLOW 02/20/2016 1442   APPEARANCEUR TURBID* 02/20/2016 1442   LABSPEC 1.004* 02/20/2016 1442   PHURINE 6.5 02/20/2016 1442   GLUCOSEU NEGATIVE  02/20/2016 1442   HGBUR MODERATE* 02/20/2016 1442   BILIRUBINUR NEGATIVE 02/20/2016 1442   KETONESUR NEGATIVE 02/20/2016 1442   PROTEINUR NEGATIVE 02/20/2016 1442   UROBILINOGEN 0.2 12/14/2013 1901   NITRITE NEGATIVE 02/20/2016 1442   LEUKOCYTESUR LARGE* 02/20/2016 1442   Sepsis Labs: !!!!!!!!!!!!!!!!!!!!!!!!!!!!!!!!!!!!!!!!!!!! @LABRCNTIP (procalcitonin:4,lacticidven:4) )No results found for this or any previous visit (from the past 240 hour(s)).   Radiological Exams on Admission: No results found.    Assessment/Plan Active Problems:   Cellulitis   This is a 45 year old female who comes to the hospital with a chief complaint of pain in the gluteal region. She has been treated for cellulitis for last 19 days with improvement of her skin lesions but pain has been persistent mainly on the left gluteal region, denies any systemic symptoms including fever or chills. On the physical examination her lesions appear to be healing but there is one in the left gluteal region lower inner quadrant that seems to be tender and indurated.   Working diagnosis: Healing cellulitis/abscess rule out deep infection  1. Cardiovascular. Patient does not have any signs of systemic infection. We'll check the cell count, follow temperature curve.  2. Pulmonary. Will check oximetry with vital signs, at this point no signs of pulmonary infection.  3. Nephrology. Patient has been on 14 days of Bactrim we'll check kidney function and electrolytes.  4. Skin cellulitis. Has had 19 days of systemic antibiotics with Bactrim and doxycycline. Will continue local antibiotic therapy with the Neosporin will check ultrasonography of the left gluteal region to rule out deep infection. At this time no signs of systemic infection.  4. Neurology. Depression we'll continue citalopram, trazodone. Tobacco dependence, smoking cessation.  Plan to discharge patient morning if no signs of deep infection.  DVT prophylaxis:  lovenox  Code Status: full Family Communication:  I spoke with patient's family with her permission and all questions were addressed.  Disposition Plan: Discharged home in the morning Consults called:  Admission status: Observation   Mauricio Gerome Apley MD [facility] Pager [phone]  If 7PM-7AM, please contact night-coverage [email or web address] Password [email or web address]  03/02/2016, 5:08 PM

## 2016-03-03 ENCOUNTER — Observation Stay (HOSPITAL_COMMUNITY): Payer: Medicare Other

## 2016-03-03 DIAGNOSIS — L03317 Cellulitis of buttock: Secondary | ICD-10-CM | POA: Diagnosis not present

## 2016-03-03 LAB — BASIC METABOLIC PANEL
ANION GAP: 5 (ref 5–15)
BUN: 13 mg/dL (ref 6–20)
CO2: 29 mmol/L (ref 22–32)
Calcium: 8.5 mg/dL — ABNORMAL LOW (ref 8.9–10.3)
Chloride: 103 mmol/L (ref 101–111)
Creatinine, Ser: 0.84 mg/dL (ref 0.44–1.00)
GFR calc Af Amer: >60 mL/min (ref >60)
GFR calc non Af Amer: >60 mL/min (ref >60)
GLUCOSE: 100 mg/dL — AB (ref 65–99)
POTASSIUM: 3.9 mmol/L (ref 3.5–5.1)
Sodium: 137 mmol/L (ref 135–145)

## 2016-03-03 LAB — CBC
HEMATOCRIT: 40.2 % (ref 36.0–46.0)
HEMOGLOBIN: 13.4 g/dL (ref 12.0–15.0)
MCH: 30.9 pg (ref 26.0–34.0)
MCHC: 33.3 g/dL (ref 30.0–36.0)
MCV: 92.8 fL (ref 78.0–100.0)
Platelets: 192 10*3/uL (ref 150–400)
RBC: 4.33 MIL/uL (ref 3.87–5.11)
RDW: 13.3 % (ref 11.5–15.5)
WBC: 4.5 10*3/uL (ref 4.0–10.5)

## 2016-03-03 MED ORDER — IBUPROFEN 400 MG PO TABS: 400.0000 mg | ORAL_TABLET | Freq: Three times a day (TID) | ORAL | Status: DC | PRN

## 2016-03-03 MED ORDER — MUPIROCIN 2 % EX OINT: TOPICAL_OINTMENT | Freq: Two times a day (BID) | CUTANEOUS | Status: DC

## 2016-03-03 MED ORDER — MUPIROCIN 2 % EX OINT
TOPICAL_OINTMENT | Freq: Three times a day (TID) | CUTANEOUS | Status: DC
  Administered 2016-03-03: 11:00:00 via TOPICAL
  Filled 2016-03-03: qty 22

## 2016-03-03 MED ORDER — MUPIROCIN 2 % EX OINT
TOPICAL_OINTMENT | Freq: Three times a day (TID) | CUTANEOUS | Status: AC
Start: 2016-03-03 — End: 2016-03-13

## 2016-03-03 NOTE — Discharge Summary (Addendum)
[name], is a 44 y.o. female  DOB 1970-11-30  MRN [id number].  Admission date:  03/02/2016  Admitting Physician  Mauricio Gerome Apley, MD  Discharge Date:  03/03/2016   Primary MD  Delia Chimes, NP  Recommendations for primary care physician for things to follow:  Patient is discharged home, patient will continue to finish course of doxycycline for 5 more days, patient will use mupirocin topically on gluteal region. Deep infection has been rule out. The lesions seem to be healing.    Admission Diagnosis  Cellulitis Buttocks MRSA   Discharge Diagnosis  Cellulitis Buttocks MRSA                                          Resolving cellulitis left gluteal region  Active Problems:   Cellulitis      Past Medical History  Diagnosis Date  . GERD (gastroesophageal reflux disease)   . IBS (irritable bowel syndrome)     no current med.  . Anxiety   . Depression   . Arthritis     left knee  . Erosive esophagitis   . Diverticulosis   . 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 Knee arthroscopy  2011    left  . Wisdom tooth extraction    . Hysteroscopy w/d&c  06/22/2012    Procedure: DILATATION AND CURETTAGE /HYSTEROSCOPY;  Surgeon: Lahoma Crocker, MD;  Location: [facility];  Service: Gynecology;  Laterality: N/A;  . Cholecystectomy  08/02/2012    Procedure: LAPAROSCOPIC CHOLECYSTECTOMY WITH INTRAOPERATIVE CHOLANGIOGRAM;  Surgeon: Rolm Bookbinder, MD;  Location: [facility];  Service: General;  Laterality: N/A;  . Colonoscopy with propofol  04/09/2014  . Esophagogastroduodenoscopy (egd) with propofol  04/09/2014  . Orif toe fracture Left 11/05/2014    Procedure: OPEN REDUCTION INTERNAL FIXATION (ORIF) LEFT FIFTH METATARSAL (TOE) FRACTURE WITH CALCANEAL BONE GRAFT;  Surgeon: Dorna Leitz, MD;  Location: [facility];  Service: Orthopedics;  Laterality: Left;       HPI  from the history and physical done on the day of admission:   This is a 45 year old lady who comes in as a direct admission referred by her primary care provider. Patient has been suffering from cellulitis and the gluteal regions for about 14 days she has been on antibiotics with clinical improvement, the day of admission patient had persistent pain that rised the concerned of worsening infection. On initial physical examination patient was afebrile, heart rate 78, blood pressure 120/82.  Her mucous membranes were moist, her lungs were clear to auscultation, heart S1-S2 present and rhythmic, her abdomen was soft and nontender, left gluteal region in the inner lower quadrant had a small nodule about 1 cm which was ulcerated without any drainage, she had a healing lesions on both gluteal regions. Her sodium was 138, creatinine 0.82,  white count 4.1, hemoglobin 14.3.   Patient was admitted to hospital with working diagnosis of healing cellulitis/abscess to rule out deep infection.     Hospital Course:   1. Cardiovascular. Patient remained hemodynamically stable, no signs of systemic infection, patient remained afebrile with no leukocytosis. No IV antibiotics were given.  2. Pulmonary. No signs of volume overload or pulmonary infection.  3. Nephrology. Patient had normal kidney function and electrolytes.  4. Skin. Gluteal cellulitis  patient lesions were healing, no purulence, no significant erythema or increased local temperature. Patient did not had any leukocytosis or elevated temperature. Ultrasonography of the left and right gluteal region was negative for deep infection. Will recommend continue local therapy for MRSA with mupirocin for next 10 days. She should have a close follow-up within 7 days. Pain control with ibuprofen.  5. Neurology. Depression patient was continued on citalopram and trazodone. Tobacco dependence  smoking cessation.  Discharge Condition: Stable  Follow UP     Consults obtained -   Diet and Activity recommendation: See Discharge Instructions below  Discharge Instructions    Patient will follow up with her primary care physician within 7 days.     Discharge Medications       Medication List    ASK your doctor about these medications        cholestyramine 4 g packet  Commonly known as:  QUESTRAN  Take 1 packet (4 g total) by mouth 2 (two) times daily.     clonazePAM 1 MG tablet  Commonly known as:  KLONOPIN  Take 1 mg by mouth 3 (three) times daily as needed for anxiety.     DEXILANT 60 MG capsule  Generic drug:  dexlansoprazole  Take 60 mg by mouth daily.     dicyclomine 10 MG capsule  Commonly known as:  BENTYL  Take 1 capsule (10 mg total) by mouth 2 (two) times daily.     dimenhyDRINATE 50 MG tablet  Commonly known as:  DRAMAMINE  Take 50 mg by mouth every 8 (eight) hours as needed for nausea.     diphenhydrAMINE 25 mg capsule  Commonly known as:  BENADRYL  Take 25 mg by mouth every 6 (six) hours as needed for itching, allergies or sleep.     doxycycline 100 MG capsule  Commonly known as:  VIBRAMYCIN  Take 100 mg by mouth 2 (two) times daily. Started 06/29 for 10 days     escitalopram 20 MG tablet  Commonly known as:  LEXAPRO  Take 20 mg by mouth daily. Reported on 09/17/2015     HYDROcodone-acetaminophen 5-325 MG tablet  Commonly known as:  NORCO/VICODIN  Take 0.5 tablets by mouth every 6 (six) hours as needed for moderate pain.     PROBIOTIC DAILY Caps  Take 1 capsule by mouth 2 (two) times daily with a meal.     promethazine 25 MG tablet  Commonly known as:  PHENERGAN  Take 1 tablet (25 mg total) by mouth every 6 (six) hours as needed for nausea or vomiting.     sulfamethoxazole-trimethoprim 800-160 MG tablet  Commonly known as:  BACTRIM DS,SEPTRA DS  Take 2 tablets by mouth 2 (two) times daily. Started 06/24 for 14 days     zolpidem  10 MG tablet  Commonly known as:  AMBIEN  Take 10 mg by mouth at bedtime.        Major procedures and Radiology Reports - PLEASE review detailed and final reports for all details, in brief -  US Pelvis Limited  03/03/2016  CLINICAL DATA:  Pustules, left gluteal, for 2 weeks. EXAM: LIMITED ULTRASOUND OF PELVIS TECHNIQUE: Limited transabdominal ultrasound examination of the pelvis was performed. COMPARISON:  Abdominal CT 02/10/2016 FINDINGS: Bands of edema in the superficial subcutaneous fat. The skin appears diffusely thickened. Negative for abscess. IMPRESSION: Left gluteal cellulitis.  Negative for abscess. Electronically Signed   By: Monte Fantasia M.D.   On: 03/03/2016 09:12   Ct Abdomen Pelvis W Contrast  02/10/2016  CLINICAL DATA:  45 year old female with history of 40 pound weight loss, abdominal pain, severe diarrhea and blood in the stool, worsening since November. Recent trips out of country to [place], [place] and [place], diagnosed with parasite infections. History of irritable bowel syndrome in the past. EXAM: CT ABDOMEN AND PELVIS WITH CONTRAST TECHNIQUE: Multidetector CT imaging of the abdomen and pelvis was performed using the standard protocol following bolus administration of intravenous contrast. CONTRAST:  163mL ISOVUE-300 IOPAMIDOL (ISOVUE-300) INJECTION 61% COMPARISON:  Multiple priors, most recently a CT of the abdomen and pelvis 10/27/2010. FINDINGS: Lower chest:  Unremarkable. Hepatobiliary: Diffuse low attenuation throughout the hepatic parenchyma, compatible with hepatic steatosis. Focal hypoattenuation adjacent to the falciform ligament in segment 4B of the liver is most compatible with severe focal fatty infiltration. No other suspicious appearing cystic or solid hepatic lesions. No intra or extrahepatic biliary ductal dilatation. Status post cholecystectomy. Pancreas: No pancreatic mass. No pancreatic ductal dilatation. No pancreatic or peripancreatic fluid or  inflammatory changes. Spleen: Unremarkable. Adrenals/Urinary Tract: Bilateral adrenal glands and bilateral kidneys are normal in appearance. No hydroureteronephrosis. Urinary bladder is normal in appearance. Stomach/Bowel: The appearance of the stomach is normal. There is no pathologic dilatation of small bowel or colon. Several colonic diverticulae are noted, without surrounding inflammatory changes to suggest an acute diverticulitis at this time. Normal appendix. Vascular/Lymphatic: Minimal atherosclerosis is noted throughout the abdominal and pelvic vasculature, without evidence of aneurysm or dissection. No lymphadenopathy is noted in the abdomen or pelvis. Reproductive: Uterus and ovaries are normal in appearance. Other: No significant volume of ascites.  No pneumoperitoneum. Musculoskeletal: There are no aggressive appearing lytic or blastic lesions noted in the visualized portions of the skeleton. IMPRESSION: 1. No acute findings in the abdomen or pelvis to account for the patient's symptoms. 2. Colonic diverticulosis without evidence of acute diverticulitis at this time. 3. Normal appendix. 4. Hepatic steatosis. 5. Additional incidental findings, as above. Electronically Signed   By: Vinnie Langton M.D.   On: 02/10/2016 15:54    Micro Results    No results found for this or any previous visit (from the past 240 hour(s)).     Today   Subjective    [name], Is feeling better, her pain is controlled, denies any nausea, vomiting. Denies any chest pain or shortness of breath.  Objective   Blood pressure 116/76, pulse 63, temperature 98.2 F (36.8 C), temperature source Oral, resp. rate 16, height 5\' 1"  (1.549 m), weight 63.05 kg (139 lb), SpO2 99 %.   Intake/Output Summary (Last 24 hours) at 03/03/16 0956 Last data filed at 03/03/16 0100  Gross per 24 hour  Intake    360 ml  Output      0 ml  Net    360 ml    Exam  Gen. Awake and alert Head. Normocephalic Oral mucosa  moist Lungs are clear to auscultation bilaterally, no wheezing rales or rhonchi. Heart S1-S2 present rhythmic ulcer per murmurs. Abdomen soft nontender Extremities no edema Skin. Healing ulcerated  lesions bilateral with one more prominent on the left lower quadrant at the left gluteal region.   Data Review   CBC w Diff: Lab Results  Component Value Date   WBC 4.5 03/03/2016   HGB 13.4 03/03/2016   HCT 40.2 03/03/2016   PLT 192 03/03/2016   LYMPHOPCT 34 03/02/2016   MONOPCT 4 03/02/2016   EOSPCT 3 03/02/2016   BASOPCT 1 03/02/2016    CMP: Lab Results  Component Value Date   NA 137 03/03/2016   K 3.9 03/03/2016   CL 103 03/03/2016   CO2 29 03/03/2016   BUN 13 03/03/2016   CREATININE 0.84 03/03/2016   PROT 7.0 02/03/2016   ALBUMIN 3.8 02/03/2016   BILITOT 0.5 02/03/2016   ALKPHOS 57 02/03/2016   AST 25 02/03/2016   ALT 42 02/03/2016  .   Total Time in preparing paper work, data evaluation and todays exam - 45 minutes  Tawni Millers M.D on 03/03/2016 at 9:56 AM  [facility]   Office  [phone]

## 2016-03-03 NOTE — Progress Notes (Signed)
Patient d/c home,d/c instructions given, verbalized understanding. Denies pain.stable.

## 2016-04-15 ENCOUNTER — Encounter: Payer: Medicare Other | Admitting: Gastroenterology

## 2016-05-25 ENCOUNTER — Emergency Department (HOSPITAL_COMMUNITY)
Admission: EM | Admit: 2016-05-25 | Discharge: 2016-05-25 | Disposition: A | Discharge status: Home / Self Care | Payer: Medicare Other | Attending: Emergency Medicine | Admitting: Emergency Medicine

## 2016-05-25 ENCOUNTER — Encounter (HOSPITAL_COMMUNITY): Payer: Self-pay

## 2016-05-25 DIAGNOSIS — F17210 Nicotine dependence, cigarettes, uncomplicated: Secondary | ICD-10-CM | POA: Insufficient documentation

## 2016-05-25 DIAGNOSIS — R21 Rash and other nonspecific skin eruption: Secondary | ICD-10-CM | POA: Diagnosis not present

## 2016-05-25 DIAGNOSIS — Z79899 Other long term (current) drug therapy: Secondary | ICD-10-CM | POA: Diagnosis not present

## 2016-05-25 HISTORY — DX: Methicillin resistant Staphylococcus aureus infection, unspecified site: A49.02

## 2016-05-25 LAB — CBC WITH DIFFERENTIAL/PLATELET
Basophils Absolute: 0 10*3/uL (ref 0.0–0.1)
Basophils Relative: 1 %
Eosinophils Absolute: 0.2 10*3/uL (ref 0.0–0.7)
Eosinophils Relative: 3 %
HCT: 45.9 % (ref 36.0–46.0)
HEMOGLOBIN: 15.2 g/dL — AB (ref 12.0–15.0)
LYMPHS ABS: 2 10*3/uL (ref 0.7–4.0)
LYMPHS PCT: 33 %
MCH: 32.6 pg (ref 26.0–34.0)
MCHC: 33.1 g/dL (ref 30.0–36.0)
MCV: 98.5 fL (ref 78.0–100.0)
Monocytes Absolute: 0.4 10*3/uL (ref 0.1–1.0)
Monocytes Relative: 6 %
NEUTROS PCT: 57 %
Neutro Abs: 3.5 10*3/uL (ref 1.7–7.7)
Platelets: 304 10*3/uL (ref 150–400)
RBC: 4.66 MIL/uL (ref 3.87–5.11)
RDW: 13.3 % (ref 11.5–15.5)
WBC: 6.1 10*3/uL (ref 4.0–10.5)

## 2016-05-25 LAB — COMPREHENSIVE METABOLIC PANEL
ALT: 19 U/L (ref 14–54)
AST: 19 U/L (ref 15–41)
Albumin: 4.1 g/dL (ref 3.5–5.0)
Alkaline Phosphatase: 63 U/L (ref 38–126)
Anion gap: 9 (ref 5–15)
BUN: 6 mg/dL (ref 6–20)
CO2: 27 mmol/L (ref 22–32)
Calcium: 9.2 mg/dL (ref 8.9–10.3)
Chloride: 103 mmol/L (ref 101–111)
Creatinine, Ser: 0.87 mg/dL (ref 0.44–1.00)
GFR calc Af Amer: >60 mL/min (ref >60)
GFR calc non Af Amer: >60 mL/min (ref >60)
Glucose, Bld: 88 mg/dL (ref 65–99)
Potassium: 3.3 mmol/L — ABNORMAL LOW (ref 3.5–5.1)
Sodium: 139 mmol/L (ref 135–145)
Total Bilirubin: 0.4 mg/dL (ref 0.3–1.2)
Total Protein: 7.5 g/dL (ref 6.5–8.1)

## 2016-05-25 LAB — I-STAT CG4 LACTIC ACID, ED
LACTIC ACID, VENOUS: 1.19 mmol/L (ref 0.5–1.9)
Lactic Acid, Venous: 2.55 mmol/L (ref 0.5–1.9)

## 2016-05-25 LAB — SEDIMENTATION RATE: SED RATE: 3 mm/h (ref 0–22)

## 2016-05-25 MED ORDER — LORAZEPAM 2 MG/ML IJ SOLN: 1.0000 mg | Freq: Once | INTRAMUSCULAR | Status: DC

## 2016-05-25 MED ORDER — FAMOTIDINE IN NACL 20-0.9 MG/50ML-% IV SOLN
20.0000 mg | Freq: Once | INTRAVENOUS | Status: AC
  Administered 2016-05-25: 20 mg via INTRAVENOUS
  Filled 2016-05-25: qty 50

## 2016-05-25 MED ORDER — PREDNISONE 20 MG PO TABS
60.0000 mg | ORAL_TABLET | Freq: Once | ORAL | Status: AC
  Administered 2016-05-25: 60 mg via ORAL
  Filled 2016-05-25: qty 3

## 2016-05-25 MED ORDER — SODIUM CHLORIDE 0.9 % IV BOLUS (SEPSIS)
1000.0000 mL | Freq: Once | INTRAVENOUS | Status: AC
  Administered 2016-05-25: 1000 mL via INTRAVENOUS

## 2016-05-25 MED ORDER — LORAZEPAM 2 MG/ML IJ SOLN
1.0000 mg | Freq: Once | INTRAMUSCULAR | Status: AC
  Administered 2016-05-25: 1 mg via INTRAVENOUS
  Filled 2016-05-25: qty 1

## 2016-05-25 MED ORDER — DIPHENHYDRAMINE HCL 50 MG/ML IJ SOLN
25.0000 mg | Freq: Once | INTRAMUSCULAR | Status: AC
  Administered 2016-05-25: 25 mg via INTRAVENOUS
  Filled 2016-05-25: qty 1

## 2016-05-25 MED ORDER — PREDNISONE 50 MG PO TABS: 50.0000 mg | ORAL_TABLET | Freq: Every day | ORAL | 0 refills | Status: DC

## 2016-05-25 MED ORDER — KETOROLAC TROMETHAMINE 30 MG/ML IJ SOLN
30.0000 mg | Freq: Once | INTRAMUSCULAR | Status: AC
  Administered 2016-05-25: 30 mg via INTRAVENOUS
  Filled 2016-05-25: qty 1

## 2016-05-25 MED ORDER — LORAZEPAM 1 MG PO TABS: 1.0000 mg | ORAL_TABLET | Freq: Three times a day (TID) | ORAL | 0 refills | Status: DC | PRN

## 2016-05-25 NOTE — Discharge Instructions (Signed)
Cause severe rash is not clear. However, it does not appear to be an infection. Continue taking Benadryl as needed for itching. Take lorazepam 3 times a day as needed to give you better relief of itching. Return if you develop a fever or symptoms are not being adequately controlled at home.

## 2016-05-25 NOTE — ED Notes (Signed)
Pt's blood pressure 80s/50s to 70s/40s. MD Roxanne Mins made aware. RN will hold ativan and give 1 L bolus of NS. Pt denies any dizziness, lightheadedness. Pt c/o itchiness but informed nothing will be given until her blood pressure stabilizes.

## 2016-05-25 NOTE — ED Provider Notes (Signed)
[facility] DEPT Provider Note   CSN: [id number] Arrival date & time: 05/25/16  0023  By signing my name below, I, Higinio Plan, attest that this documentation has been prepared under the direction and in the presence of Delora Fuel, MD . Electronically Signed: Higinio Plan, Scribe. 05/25/2016. 4:11 AM.  History   Chief Complaint Chief Complaint  Patient presents with  . Rash   The history is provided by the patient. No language interpreter was used.   HPI Comments: [name] is a 45 y.o. female with PMHx of MRSA, who presents to the Emergency Department complaining of gradually worsening, 10/10, "burning and itching" rash on her buttocks and hip that began a couple weeks ago and worsened yesterday. She notes her rash also began to spread upwards into her chest, torso and neck yesterday following the onset of subjective fever. She states she has taken benadryl with no relief. Pt reports she was diagnosed with MRSA infection on 03/02/16 in which she was prescribed Doxycycline and Cipro; she states she has been off of antibiotics for 3 weeks. Pt reports she recently got married on 9/4 in [place], [place].    Past Medical History:  Diagnosis Date  . Anxiety   . Arthritis    left knee  . Depression   . Diverticulosis   . Erosive esophagitis   . 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 GERD (gastroesophageal reflux disease)   . IBS (irritable bowel syndrome)    no current med.  [name] MRSA (methicillin resistant Staphylococcus aureus)     Patient Active Problem List   Diagnosis Date Noted  . Cellulitis 03/02/2016  . Loss of weight 02/04/2016  . Diarrhea of presumed infectious origin 02/04/2016  . Generalized abdominal pain 02/04/2016  . Internal hemorrhoid, bleeding 04/24/2014  . Irregular menstrual cycle 06/22/2012  . TOBACCO ABUSE 10/19/2009  . SYNCOPE 10/19/2009  . CHEST PAIN 10/19/2009  . SYNCOPE, HX OF 10/19/2009  . CANDIDIASIS OF THE  ESOPHAGUS 06/17/2009  . LEG PAIN, LEFT 12/22/2008  . HAIR LOSS 06/03/2008  . HEMORRHOIDS, INTERNAL, WITH BLEEDING 11/01/2007  . VITAMIN B12 DEFICIENCY 09/05/2007  . DYSPLASTIC NEVUS 08/14/2007  . THROMBOCYTOSIS 07/03/2007  . Diarrhea 07/03/2007  . 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 HYPERCHOLESTEROLEMIA 05/31/2007  . DISORDER, BIPOLAR NEC 05/31/2007  . PSORIASIS 05/31/2007  . INSOMNIA 05/31/2007  . COCAINE ABUSE 05/14/2007  . PERIODONTAL DISEASE 05/14/2007  . DIVERTICULOSIS, COLON, HX OF 05/14/2007  . MYALGIA, HX OF 05/14/2007    Past Surgical History:  Procedure Laterality Date  . CHOLECYSTECTOMY  08/02/2012   Procedure: LAPAROSCOPIC CHOLECYSTECTOMY WITH INTRAOPERATIVE CHOLANGIOGRAM;  Surgeon: Rolm Bookbinder, MD;  Location: [facility];  Service: General;  Laterality: N/A;  . COLONOSCOPY WITH PROPOFOL  04/09/2014  . ESOPHAGOGASTRODUODENOSCOPY (EGD) WITH PROPOFOL  04/09/2014  . HYSTEROSCOPY W/D&C  06/22/2012   Procedure: DILATATION AND CURETTAGE /HYSTEROSCOPY;  Surgeon: Lahoma Crocker, MD;  Location: [facility];  Service: Gynecology;  Laterality: N/A;  . KNEE ARTHROSCOPY  2011   left  . ORIF TOE FRACTURE Left 11/05/2014   Procedure: OPEN REDUCTION INTERNAL FIXATION (ORIF) LEFT FIFTH METATARSAL (TOE) FRACTURE WITH CALCANEAL BONE GRAFT;  Surgeon: Dorna Leitz, MD;  Location: [facility];  Service: Orthopedics;  Laterality: Left;  . WISDOM TOOTH EXTRACTION      OB History    No data available     Home Medications    Prior to Admission medications   Medication Sig Start Date  End Date Taking? Authorizing Provider  cholestyramine (QUESTRAN) 4 g packet Take 1 packet (4 g total) by mouth 2 (two) times daily. Patient not taking: Reported on 03/02/2016 02/12/16   Janett Billow D Zehr, PA-C  clonazePAM (KLONOPIN) 1 MG tablet Take 1 mg by mouth 3 (three) times daily as needed for anxiety.  09/14/15   Historical Provider, MD  dexlansoprazole (DEXILANT) 60 MG capsule Take 60 mg by mouth  daily.    Historical Provider, MD  dicyclomine (BENTYL) 10 MG capsule Take 1 capsule (10 mg total) by mouth 2 (two) times daily. Patient taking differently: Take 10 mg by mouth 2 (two) times daily before a meal.  02/04/16   Jessica D Zehr, PA-C  dimenhyDRINATE (DRAMAMINE) 50 MG tablet Take 50 mg by mouth every 8 (eight) hours as needed for nausea.    Historical Provider, MD  doxycycline (VIBRAMYCIN) 100 MG capsule Take 100 mg by mouth 2 (two) times daily. Started 06/29 for 10 days    Historical Provider, MD  escitalopram (LEXAPRO) 20 MG tablet Take 20 mg by mouth daily. Reported on 09/17/2015 09/08/15   Historical Provider, MD  ibuprofen (ADVIL,MOTRIN) 400 MG tablet Take 1 tablet (400 mg total) by mouth every 8 (eight) hours as needed for moderate pain. 03/03/16   Mauricio Gerome Apley, MD  Probiotic Product (PROBIOTIC DAILY) CAPS Take 1 capsule by mouth 2 (two) times daily with a meal.    Historical Provider, MD  promethazine (PHENERGAN) 25 MG tablet Take 1 tablet (25 mg total) by mouth every 6 (six) hours as needed for nausea or vomiting. 09/22/15   Manya Silvas, CNM  zolpidem (AMBIEN) 10 MG tablet Take 10 mg by mouth at bedtime. 09/14/15   Historical Provider, MD    Family History Family History  Problem Relation Age of Onset  . Heart disease Father   . Stroke Father   . Cancer - Ovarian Mother   . Cancer - Colon Maternal Grandfather   . Stomach cancer Paternal Grandmother   . Cancer - Ovarian Sister     Social History Social History  Substance Use Topics  . Smoking status: Current Every Day Smoker    Packs/day: 0.50    Years: 28.00    Types: Cigarettes  . Smokeless tobacco: Never Used     Comment: form given 02-04-16   . Alcohol use No     Allergies   Codeine   Review of Systems Review of Systems  Constitutional: Positive for fever.  Skin: Positive for rash.   Physical Exam Updated Vital Signs BP 123/93 (BP Location: Left Arm)   Pulse 84   Temp 98 F (36.7 C) (Oral)    Resp 20   Ht 5' (1.524 m)   Wt 134 lb (60.8 kg)   SpO2 97%   BMI 26.17 kg/m   Physical Exam  Constitutional: She is oriented to person, place, and time. She appears well-developed and well-nourished.  HENT:  Head: Normocephalic and atraumatic.  Eyes: EOM are normal. Pupils are equal, round, and reactive to light.  Neck: Normal range of motion. Neck supple. No JVD present.  Cardiovascular: Normal rate, regular rhythm and normal heart sounds.   No murmur heard. Pulmonary/Chest: Effort normal and breath sounds normal. She has no wheezes. She has no rales. She exhibits no tenderness.  Abdominal: Soft. Bowel sounds are normal. She exhibits no distension and no mass. There is no tenderness.  Musculoskeletal: Normal range of motion. She exhibits no edema.  Lymphadenopathy:    She  has no cervical adenopathy.  Neurological: She is alert and oriented to person, place, and time. No cranial nerve deficit. She exhibits normal muscle tone. Coordination normal.  Skin: Skin is warm and dry. Rash noted.  Faint papular rash on chest. Rash in the intergluteal area which is erythematous, raised lesions that are confluent. No drainage or induration.   Psychiatric: She has a normal mood and affect. Her behavior is normal. Judgment and thought content normal.  Nursing note and vitals reviewed.  ED Treatments / Results  Labs (all labs ordered are listed, but only abnormal results are displayed) Labs Reviewed  COMPREHENSIVE METABOLIC PANEL - Abnormal; Notable for the following:       Result Value   Potassium 3.3 (*)    All other components within normal limits  CBC WITH DIFFERENTIAL/PLATELET - Abnormal; Notable for the following:    Hemoglobin 15.2 (*)    All other components within normal limits  I-STAT CG4 LACTIC ACID, ED - Abnormal; Notable for the following:    Lactic Acid, Venous 2.55 (*)    All other components within normal limits  CULTURE, BLOOD (ROUTINE X 2)  CULTURE, BLOOD (ROUTINE X 2)    SEDIMENTATION RATE  I-STAT CG4 LACTIC ACID, ED     Procedures Procedures (including critical care time)  Medications Ordered in ED Medications  LORazepam (ATIVAN) injection 1 mg (0 mg Intravenous Hold 05/25/16 0735)  sodium chloride 0.9 % bolus 1,000 mL (1,000 mLs Intravenous New Bag/Given 05/25/16 0737)  predniSONE (DELTASONE) tablet 60 mg (not administered)  LORazepam (ATIVAN) injection 1 mg (1 mg Intravenous Given 05/25/16 0421)  ketorolac (TORADOL) 30 MG/ML injection 30 mg (30 mg Intravenous Given 05/25/16 0421)  diphenhydrAMINE (BENADRYL) injection 25 mg (25 mg Intravenous Given 05/25/16 0421)  famotidine (PEPCID) IVPB 20 mg premix (0 mg Intravenous Stopped 05/25/16 0507)    DIAGNOSTIC STUDIES:  Oxygen Saturation is 97% on RA, normal by my interpretation.    COORDINATION OF CARE:  4:08 AM Discussed treatment plan with pt at bedside and pt agreed to plan.  Initial Impression / Assessment and Plan / ED Course  I have reviewed the triage vital signs and the nursing notes.  Pertinent labs & imaging results that were available during my care of the patient were reviewed by me and considered in my medical decision making (see chart for details).  Clinical Course   Rash of uncertain cause. Does not appear to be cellulitis. However, it is in the area where she had cellulitis from MRSA requiring hospitalization. Will screen for sepsis with CBC and blood cultures and lactic acid. Old records are reviewed confirming a hospitalization for cellulitis from MRSA 3 months ago. She will be given diphenhydramine, famotidine, and lorazepam to help with her itching and burning.  She had reasonable relief with above noted treatment. Laboratory workup is unremarkable including normal sedimentation rate and normal lactic acid. She is given a dose of prednisone and is discharged with prescription for prednisone and lorazepam. Told to continue taking Benadryl as needed. Follow-up with PCP, return  precautions given.  I personally performed the services described in this documentation, which was scribed in my presence. The recorded information has been reviewed and is accurate.   Final Clinical Impressions(s) / ED Diagnoses   Final diagnoses:  Rash    New Prescriptions New Prescriptions   LORAZEPAM (ATIVAN) 1 MG TABLET    Take 1 tablet (1 mg total) by mouth 3 (three) times daily as needed for anxiety (or itching).  PREDNISONE (DELTASONE) 50 MG TABLET    Take 1 tablet (50 mg total) by mouth daily.     Delora Fuel, MD [phone]

## 2016-05-25 NOTE — ED Triage Notes (Signed)
PT has a hx of MRSA infection. Now complaining of a rash that has spread from bottom to hip. Also complaining of itchiness on her chest. Endorses itchiness and burning pain. Also endorsing body aches. Pt has been off antibiotics for the last 3 weeks.

## 2016-05-26 ENCOUNTER — Other Ambulatory Visit: Payer: Self-pay | Admitting: Certified Nurse Midwife

## 2016-05-26 NOTE — Progress Notes (Unsigned)
+   MRSA culture on wound clinda and bactrim susceptible.

## 2016-05-30 LAB — CULTURE, BLOOD (ROUTINE X 2)
Culture: NO GROWTH
Culture: NO GROWTH

## 2016-06-14 ENCOUNTER — Ambulatory Visit (INDEPENDENT_AMBULATORY_CARE_PROVIDER_SITE_OTHER): Payer: Medicare Other | Admitting: Certified Nurse Midwife

## 2016-06-14 ENCOUNTER — Other Ambulatory Visit (HOSPITAL_COMMUNITY)
Admission: RE | Admit: 2016-06-14 | Discharge: 2016-06-14 | Disposition: A | Discharge status: Home / Self Care | Payer: Medicare Other | Source: Ambulatory Visit | Attending: Certified Nurse Midwife | Admitting: Certified Nurse Midwife

## 2016-06-14 ENCOUNTER — Encounter: Payer: Self-pay | Admitting: Certified Nurse Midwife

## 2016-06-14 VITALS — BP 127/93 | HR 86 | Ht 61.0 in | Wt 136.0 lb

## 2016-06-14 DIAGNOSIS — Z8041 Family history of malignant neoplasm of ovary: Secondary | ICD-10-CM | POA: Diagnosis not present

## 2016-06-14 DIAGNOSIS — R634 Abnormal weight loss: Secondary | ICD-10-CM

## 2016-06-14 DIAGNOSIS — Z124 Encounter for screening for malignant neoplasm of cervix: Secondary | ICD-10-CM | POA: Diagnosis not present

## 2016-06-14 DIAGNOSIS — R5381 Other malaise: Secondary | ICD-10-CM

## 2016-06-14 DIAGNOSIS — Z01419 Encounter for gynecological examination (general) (routine) without abnormal findings: Secondary | ICD-10-CM | POA: Diagnosis not present

## 2016-06-14 DIAGNOSIS — R5382 Chronic fatigue, unspecified: Secondary | ICD-10-CM

## 2016-06-14 DIAGNOSIS — Z113 Encounter for screening for infections with a predominantly sexual mode of transmission: Secondary | ICD-10-CM | POA: Diagnosis present

## 2016-06-14 DIAGNOSIS — Z1231 Encounter for screening mammogram for malignant neoplasm of breast: Secondary | ICD-10-CM | POA: Diagnosis not present

## 2016-06-14 DIAGNOSIS — L739 Follicular disorder, unspecified: Secondary | ICD-10-CM

## 2016-06-14 DIAGNOSIS — N760 Acute vaginitis: Secondary | ICD-10-CM | POA: Diagnosis present

## 2016-06-14 DIAGNOSIS — Z7251 High risk heterosexual behavior: Secondary | ICD-10-CM

## 2016-06-14 DIAGNOSIS — Z202 Contact with and (suspected) exposure to infections with a predominantly sexual mode of transmission: Secondary | ICD-10-CM | POA: Diagnosis not present

## 2016-06-14 DIAGNOSIS — L679 Hair color and hair shaft abnormality, unspecified: Secondary | ICD-10-CM

## 2016-06-14 DIAGNOSIS — L900 Lichen sclerosus et atrophicus: Secondary | ICD-10-CM | POA: Diagnosis not present

## 2016-06-14 DIAGNOSIS — K645 Perianal venous thrombosis: Secondary | ICD-10-CM

## 2016-06-14 DIAGNOSIS — R17 Unspecified jaundice: Secondary | ICD-10-CM | POA: Diagnosis not present

## 2016-06-14 DIAGNOSIS — L308 Other specified dermatitis: Secondary | ICD-10-CM

## 2016-06-14 DIAGNOSIS — Z1239 Encounter for other screening for malignant neoplasm of breast: Secondary | ICD-10-CM

## 2016-06-14 MED ORDER — TRIAMCINOLONE ACETONIDE 0.1 % EX OINT: 1.0000 "application " | TOPICAL_OINTMENT | Freq: Four times a day (QID) | CUTANEOUS | 99 refills | Status: DC

## 2016-06-14 MED ORDER — CLOBETASOL PROPIONATE 0.05 % EX OINT: 1.0000 "application " | TOPICAL_OINTMENT | Freq: Two times a day (BID) | CUTANEOUS | 0 refills | Status: DC

## 2016-06-14 NOTE — Progress Notes (Signed)
Subjective:      [name] is a 45 y.o. female here for a routine exam.  Current complaints: multiple complaints r/t: recent MRSA and parasitic infection.  No period for 4 years, premature ovarian failure. Recently newly married.  Reports loss of hair on her scalp within the last 6 months, has bald patches.  Is being followed by primary care physician and hematology.     Personal health questionnaire:  Is patient Ashkenazi Jewish, have a family history of breast and/or ovarian cancer: yes Is there a family history of uterine cancer diagnosed at age < 10, gastrointestinal cancer, urinary tract cancer, family member who is a Field seismologist syndrome-associated carrier: yes Is the patient overweight and hypertensive, family history of diabetes, personal history of gestational diabetes, preeclampsia or PCOS: no Is patient over 25, have PCOS,  family history of premature CHD under age 27, diabetes, smoke, have hypertension or peripheral artery disease:  yes At any time, has a partner hit, kicked or otherwise hurt or frightened you?: no Over the past 2 weeks, have you felt down, depressed or hopeless?: no Over the past 2 weeks, have you felt little interest or pleasure in doing things?:no   Gynecologic History No LMP recorded. Patient is not currently having periods (Reason: Perimenopausal). Contraception: post menopausal status Last Pap: 2013. Results were: normal  Last mammogram: no past exams.   Obstetric History OB History  No data available    Past Medical History:  Diagnosis Date  . Anxiety   . Arthritis    left knee  . Depression   . Diverticulosis   . Erosive esophagitis   . 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 GERD (gastroesophageal reflux disease)   . IBS (irritable bowel syndrome)    no current med.  [name] MRSA (methicillin resistant Staphylococcus aureus)     Past Surgical History:  Procedure Laterality Date  . CHOLECYSTECTOMY   08/02/2012   Procedure: LAPAROSCOPIC CHOLECYSTECTOMY WITH INTRAOPERATIVE CHOLANGIOGRAM;  Surgeon: Rolm Bookbinder, MD;  Location: [facility];  Service: General;  Laterality: N/A;  . COLONOSCOPY WITH PROPOFOL  04/09/2014  . ESOPHAGOGASTRODUODENOSCOPY (EGD) WITH PROPOFOL  04/09/2014  . HYSTEROSCOPY W/D&C  06/22/2012   Procedure: DILATATION AND CURETTAGE /HYSTEROSCOPY;  Surgeon: Lahoma Crocker, MD;  Location: [facility];  Service: Gynecology;  Laterality: N/A;  . KNEE ARTHROSCOPY  2011   left  . ORIF TOE FRACTURE Left 11/05/2014   Procedure: OPEN REDUCTION INTERNAL FIXATION (ORIF) LEFT FIFTH METATARSAL (TOE) FRACTURE WITH CALCANEAL BONE GRAFT;  Surgeon: Dorna Leitz, MD;  Location: [facility];  Service: Orthopedics;  Laterality: Left;  . WISDOM TOOTH EXTRACTION       Current Outpatient Prescriptions:  .  clonazePAM (KLONOPIN) 1 MG tablet, Take 1 mg by mouth 3 (three) times daily as needed for anxiety. , Disp: , Rfl:  .  dexlansoprazole (DEXILANT) 60 MG capsule, Take 60 mg by mouth daily., Disp: , Rfl:  .  dicyclomine (BENTYL) 10 MG capsule, Take 1 capsule (10 mg total) by mouth 2 (two) times daily. (Patient taking differently: Take 10 mg by mouth 2 (two) times daily before a meal. ), Disp: 60 capsule, Rfl: 11 .  diphenhydrAMINE (BENADRYL) 25 mg capsule, Take 25 mg by mouth every 6 (six) hours as needed for itching., Disp: , Rfl:  .  loperamide (IMODIUM) 2 MG capsule, Take 2 mg by mouth as needed for diarrhea or loose stools., Disp: , Rfl:  .  promethazine (  PHENERGAN) 25 MG tablet, Take 1 tablet (25 mg total) by mouth every 6 (six) hours as needed for nausea or vomiting., Disp: 30 tablet, Rfl: 0 .  traZODone (DESYREL) 50 MG tablet, Take 50 mg by mouth at bedtime., Disp: , Rfl:  .  zolpidem (AMBIEN) 10 MG tablet, Take 10 mg by mouth at bedtime., Disp: , Rfl:  .  ibuprofen (ADVIL,MOTRIN) 400 MG tablet, Take 1 tablet (400 mg total) by mouth every 8 (eight) hours as needed for moderate pain.  (Patient not taking: Reported on 06/14/2016), Disp: 30 tablet, Rfl: 0 .  LORazepam (ATIVAN) 1 MG tablet, Take 1 tablet (1 mg total) by mouth 3 (three) times daily as needed for anxiety (or itching). (Patient not taking: Reported on 06/14/2016), Disp: 10 tablet, Rfl: 0 .  predniSONE (DELTASONE) 50 MG tablet, Take 1 tablet (50 mg total) by mouth daily. (Patient not taking: Reported on 06/14/2016), Disp: 5 tablet, Rfl: 0 Allergies  Allergen Reactions  . Codeine Nausea And Vomiting    "Pt can take Vicodin if given with promethazine"    Social History  Substance Use Topics  . Smoking status: Current Every Day Smoker    Packs/day: 0.50    Years: 28.00    Types: Cigarettes  . Smokeless tobacco: Never Used     Comment: form given 02-04-16   . Alcohol use No    Family History  Problem Relation Age of Onset  . Heart disease Father   . Stroke Father   . Cancer - Ovarian Mother   . Cancer - Colon Maternal Grandfather   . Stomach cancer Paternal Grandmother   . Cancer - Ovarian Sister       Review of Systems  Constitutional: negative for fatigue and weight loss Respiratory: negative for cough and wheezing Cardiovascular: negative for chest pain, fatigue and palpitations Gastrointestinal: negative for abdominal pain and change in bowel habits Musculoskeletal:negative for myalgias Neurological: negative for gait problems and tremors Behavioral/Psych: negative for abusive relationship, depression Endocrine: negative for temperature intolerance   Genitourinary:negative for abnormal menstrual periods, genital lesions, hot flashes, sexual problems and vaginal discharge Integument/breast: negative for breast lump, breast tenderness, nipple discharge and skin lesion(s)    Objective:       BP (!) 127/93   Pulse 86   Ht 5\' 1"  (1.549 m)   Wt 136 lb (61.7 kg)   BMI 25.70 kg/m  General:   alert  Skin:   no rash or abnormalities  Lungs:   clear to auscultation bilaterally  Heart:   regular  rate and rhythm, S1, S2 normal, no murmur, click, rub or gallop  Breasts:   normal without suspicious masses, skin or nipple changes or axillary nodes  Abdomen:  normal findings: no organomegaly, soft, non-tender and no hernia  Pelvis:  External genitalia: clitoral head minimal, +silvery scales present Urinary system: urethral meatus normal and bladder without fullness, nontender Vaginal: normal without tenderness, induration or masses, absent ruguae Cervix: normal appearance Adnexa: normal bimanual exam Uterus: anteverted and non-tender, normal size Anus: class 3-4 hemorrhoids   Lab Review Urine pregnancy test Labs reviewed yes Radiologic studies reviewed yes  50% of 30 min visit spent on counseling and coordination of care.   Assessment:    Healthy female exam.   Status post-menopausal  Tobacco abuse  Premature ovarian failure  Eczema  Lichen sclerosus  STD screening exam  Hemorrhoids  Jaundice  Plan:    Education reviewed: calcium supplements, depression evaluation, low fat, low cholesterol diet,  safe sex/STD prevention, self breast exams, skin cancer screening and weight bearing exercise. Mammogram ordered. Follow up in: 1 year.   Meds ordered this encounter  Medications  . traZODone (DESYREL) 50 MG tablet    Sig: Take 50 mg by mouth at bedtime.   No orders of the defined types were placed in this encounter.  Need to obtain previous records Possible management options include: liver function panel Follow up as needed.

## 2016-06-15 ENCOUNTER — Telehealth: Payer: Self-pay | Admitting: *Deleted

## 2016-06-15 LAB — HEPATITIS B SURFACE ANTIGEN: Hepatitis B Surface Ag: NEGATIVE

## 2016-06-15 LAB — HIV ANTIBODY (ROUTINE TESTING W REFLEX): HIV Screen 4th Generation wRfx: NONREACTIVE

## 2016-06-15 LAB — HEPATITIS C ANTIBODY: Hep C Virus Ab: <0.1 s/co ratio (ref 0.0–0.9)

## 2016-06-15 LAB — RPR: RPR: NONREACTIVE

## 2016-06-15 NOTE — Telephone Encounter (Signed)
Pt called in and stated she lost the prescription for the rocket rectum suppositorie

## 2016-06-16 ENCOUNTER — Encounter: Payer: Self-pay | Admitting: Certified Nurse Midwife

## 2016-06-16 LAB — CERVICOVAGINAL ANCILLARY ONLY
CHLAMYDIA, DNA PROBE: NEGATIVE
NEISSERIA GONORRHEA: NEGATIVE
Trichomonas: NEGATIVE

## 2016-06-16 NOTE — Telephone Encounter (Signed)
Rx was faxed to [facility] this morning.  A copy of the RX is up front for her.  Thank you.  R.Raylon Lamson CNM

## 2016-06-17 LAB — CYTOLOGY - PAP: Diagnosis: NEGATIVE

## 2016-06-20 LAB — CERVICOVAGINAL ANCILLARY ONLY
Bacterial vaginitis: NEGATIVE
Candida vaginitis: NEGATIVE

## 2016-06-21 ENCOUNTER — Encounter: Payer: Self-pay | Admitting: Certified Nurse Midwife

## 2016-06-22 ENCOUNTER — Other Ambulatory Visit: Payer: Self-pay | Admitting: Certified Nurse Midwife

## 2016-06-22 ENCOUNTER — Ambulatory Visit (INDEPENDENT_AMBULATORY_CARE_PROVIDER_SITE_OTHER): Payer: Medicare Other | Admitting: Gastroenterology

## 2016-06-22 ENCOUNTER — Telehealth: Payer: Self-pay | Admitting: Genetic Counselor

## 2016-06-22 ENCOUNTER — Encounter: Payer: Self-pay | Admitting: Gastroenterology

## 2016-06-22 ENCOUNTER — Encounter: Payer: Self-pay | Admitting: Genetic Counselor

## 2016-06-22 VITALS — BP 128/64 | Ht 60.0 in | Wt 135.4 lb

## 2016-06-22 DIAGNOSIS — R197 Diarrhea, unspecified: Secondary | ICD-10-CM

## 2016-06-22 DIAGNOSIS — R1310 Dysphagia, unspecified: Secondary | ICD-10-CM

## 2016-06-22 MED ORDER — DICYCLOMINE HCL 20 MG PO TABS
20.0000 mg | ORAL_TABLET | Freq: Three times a day (TID) | ORAL | 2 refills | Status: DC
Start: 2016-06-22 — End: 2016-07-14

## 2016-06-22 MED ORDER — NA SULFATE-K SULFATE-MG SULF 17.5-3.13-1.6 GM/177ML PO SOLN: 1.0000 | Freq: Once | ORAL | 0 refills | Status: AC

## 2016-06-22 MED ORDER — PANTOPRAZOLE SODIUM 40 MG PO TBEC: 40.0000 mg | DELAYED_RELEASE_TABLET | Freq: Every day | ORAL | 3 refills | Status: DC

## 2016-06-22 MED ORDER — SACCHAROMYCES BOULARDII 250 MG PO CAPS: 250.0000 mg | ORAL_CAPSULE | Freq: Two times a day (BID) | ORAL | 0 refills | Status: DC

## 2016-06-22 NOTE — Telephone Encounter (Signed)
Spoke to [place] at [employer] [facility] for [facility], to schedule a genetic appt. Appt scheduled with Roma Kayser on 11/29 at [place] her aware that the patient needs to arrive 30 minutes early. She will notify the patient of the appt date and time. Letter mailed to the pt.

## 2016-06-22 NOTE — Patient Instructions (Addendum)
We sent prescriptions to your pharmacy.  You have been scheduled for an endoscopy and colonoscopy. Please follow the written instructions given to you at your visit today. Please pick up your prep supplies at the pharmacy within the next 1-3 days. If you use inhalers (even only as needed), please bring them with you on the day of your procedure. Your physician has requested that you go to www.startemmi.com and enter the access code given to you at your visit today. This web site gives a general overview about your procedure. However, you should still follow specific instructions given to you by our office regarding your preparation for the procedure. Discontinue Dexilant. Take Florastor twice daily, it is over the counter.

## 2016-06-23 ENCOUNTER — Encounter: Payer: Self-pay | Admitting: Gastroenterology

## 2016-06-23 ENCOUNTER — Ambulatory Visit
Admission: RE | Admit: 2016-06-23 | Discharge: 2016-06-23 | Disposition: A | Discharge status: Home / Self Care | Payer: Medicare Other | Source: Ambulatory Visit | Attending: Certified Nurse Midwife | Admitting: Certified Nurse Midwife

## 2016-06-23 DIAGNOSIS — R1310 Dysphagia, unspecified: Secondary | ICD-10-CM | POA: Insufficient documentation

## 2016-06-23 DIAGNOSIS — Z1239 Encounter for other screening for malignant neoplasm of breast: Secondary | ICD-10-CM

## 2016-06-23 DIAGNOSIS — Z01419 Encounter for gynecological examination (general) (routine) without abnormal findings: Secondary | ICD-10-CM

## 2016-06-23 NOTE — Progress Notes (Signed)
06/23/2016 [name] [id number] 1971-06-18   History of Present Illness:  45 year old female known to Dr. Ardis Hughs and seen by myself earlier this year for complaints of diarrhea that are presumed to be secondary to IBS. Colonoscopy in 03/2014 revealed diverticulum throughout her entire colon, but was otherwise normal and random biopsies negative for microscopic colitis. EGD normal at that time as well.  Was seen by myself in June of this year for complaints of diarrhea, abdominal pain, and weight loss that she thought was infectious.  Has been treated with Cipro/Flagyl, Augmentin, and ivermectin despite negative stool studies. CT of the abdomen and pelvis with contrast was unremarkable.  She returns today with ongoing symptoms after almost 5 months hiatus. Says that she's had a lot of other health issues since she was seen here. Her GI symptoms have continued throughout the last several months. Says that she tried Questran once a day that did not seem to help her diarrhea. Takes dicyclomine 20 mg and feels like that helps to some degree, but has to take it multiple times a day. She reports 60 pound weight loss, but since 2015 she's down about 40 pounds according to our scales. She apparently has been seen by the new surgeon at [facility], Dr. Kae Heller, who would like to do some surgery for her hemorrhoids but told her that she needed a repeat colonoscopy even though she only had one done just 2 years ago that was unremarkable.  Also complains of food getting stuck in her esophagus frequently when trying to eat.   Current Medications, Allergies, Past Medical History, Past Surgical History, Family History and Social History were reviewed in [employer] record.   Physical Exam: BP 128/64 (BP Location: Left Arm, Patient Position: Sitting, Cuff Size: Normal)   Ht 5' (1.524 m)   Wt 135 lb 6.4 oz (61.4 kg)   BMI 26.44 kg/m  General: Well developed white  female in no acute distress Head: Normocephalic and atraumatic Eyes:  Sclerae anicteric, conjunctiva pink  Ears: Normal auditory acuity Lungs: Clear throughout to auscultation Heart: Regular rate and rhythm Abdomen: Soft, non-distended.  Normal bowel sounds.  Diffuse TTP. Rectal:  Will be done at the time of colonoscopy. Musculoskeletal: Symmetrical with no gross deformities  Extremities: No edema  Neurological: Alert oriented x 4, grossly non-focal Psychological:  Alert and cooperative. Normal mood and affect  Assessment and Recommendations: -45 year old female with chronic GI complaints including diarrhea which has been presumed to be IBS. With several months of worsening of her diarrhea associated with weight loss of 40 pounds and diffuse abdominal pain.  Was treated for infectious sources without success. He has been on several antibiotics for other illnesses recently but tells me she had a negative C. difficile last week by her PCP. We will try to obtain those results. Previous colonoscopy with random biopsies  In 03/2014 negative for microscopic colitis. She reports that surgeon from [facility] wants her to have another colonoscopy before she proceeds with any surgery for her hemorrhoids.  Will schedule with Dr. Ardis Hughs.  I still believe that her ongoing diagnosis for her diarrhea is IBS +/- bile salt related.  I'm going to have her discontinue her Dexilant 60 mg daily (can cause diarrhea) and try pantoprazole 40 mg daily instead.  I have also asked her to begin taking Florastor twice daily. -Dysphagia:  Also had a normal EGD in 03/2014, but reports that these issues with swallowing are new so will  schedule repeat EGD as well.  *Just of note, the patient told me that the surgeon was aware that she just had a colonoscopy 2 years ago but then when I received the office visit note from Dr. Kae Heller she reports that she was told by the patient that she does not recall her last colonoscopy as it has been a  while. *The risks, benefits, and alternatives to EGD with possible dilation and colonoscopy were discussed with the patient and she consents to proceed.

## 2016-06-23 NOTE — Progress Notes (Signed)
I agree with the above note, plan 

## 2016-07-11 ENCOUNTER — Telehealth: Payer: Self-pay | Admitting: Gastroenterology

## 2016-07-11 NOTE — Telephone Encounter (Signed)
I just spoke with Dr. Leighton Ruff from [facility] about her.    Patty, Can you contact her and offer colonoscopy/EGD this Thursday (16th) at [facility] with MAC sedation.  For dysphagia, weight loss, diarrhea.  ?  Thanks  DJ

## 2016-07-12 ENCOUNTER — Other Ambulatory Visit: Payer: Self-pay

## 2016-07-12 ENCOUNTER — Encounter (HOSPITAL_COMMUNITY): Payer: Self-pay | Admitting: *Deleted

## 2016-07-12 DIAGNOSIS — R1310 Dysphagia, unspecified: Secondary | ICD-10-CM

## 2016-07-12 DIAGNOSIS — R634 Abnormal weight loss: Secondary | ICD-10-CM

## 2016-07-12 DIAGNOSIS — R197 Diarrhea, unspecified: Secondary | ICD-10-CM

## 2016-07-12 MED ORDER — NA SULFATE-K SULFATE-MG SULF 17.5-3.13-1.6 GM/177ML PO SOLN: 1.0000 | Freq: Once | ORAL | 0 refills | Status: AC

## 2016-07-12 NOTE — Telephone Encounter (Signed)
Pt is returning call to Patty best contact number is [phone].

## 2016-07-12 NOTE — Telephone Encounter (Signed)
Endo colon scheduled, pt instructed and medications reviewed.  Patient instructions mailed to home.  Patient to call with any questions or concerns.  

## 2016-07-12 NOTE — Telephone Encounter (Signed)
Pt has been scheduled for endo colon [facility] 07/14/16 930 am Left message on machine to call back

## 2016-07-14 ENCOUNTER — Ambulatory Visit (HOSPITAL_COMMUNITY): Payer: Medicare Other | Admitting: Anesthesiology

## 2016-07-14 ENCOUNTER — Ambulatory Visit (HOSPITAL_COMMUNITY)
Admission: RE | Admit: 2016-07-14 | Discharge: 2016-07-14 | Disposition: A | Discharge status: Home / Self Care | Payer: Medicare Other | Source: Ambulatory Visit | Attending: Gastroenterology | Admitting: Gastroenterology

## 2016-07-14 ENCOUNTER — Encounter (HOSPITAL_COMMUNITY): Payer: Self-pay | Admitting: *Deleted

## 2016-07-14 ENCOUNTER — Encounter (HOSPITAL_COMMUNITY)
Admission: RE | Disposition: A | Discharge status: Home / Self Care | Payer: Self-pay | Source: Ambulatory Visit | Attending: Gastroenterology

## 2016-07-14 DIAGNOSIS — K644 Residual hemorrhoidal skin tags: Secondary | ICD-10-CM | POA: Diagnosis not present

## 2016-07-14 DIAGNOSIS — K2990 Gastroduodenitis, unspecified, without bleeding: Secondary | ICD-10-CM | POA: Diagnosis not present

## 2016-07-14 DIAGNOSIS — R197 Diarrhea, unspecified: Secondary | ICD-10-CM | POA: Diagnosis not present

## 2016-07-14 DIAGNOSIS — K529 Noninfective gastroenteritis and colitis, unspecified: Secondary | ICD-10-CM | POA: Insufficient documentation

## 2016-07-14 DIAGNOSIS — F17200 Nicotine dependence, unspecified, uncomplicated: Secondary | ICD-10-CM | POA: Diagnosis not present

## 2016-07-14 DIAGNOSIS — K219 Gastro-esophageal reflux disease without esophagitis: Secondary | ICD-10-CM | POA: Diagnosis not present

## 2016-07-14 DIAGNOSIS — R1310 Dysphagia, unspecified: Secondary | ICD-10-CM | POA: Diagnosis not present

## 2016-07-14 DIAGNOSIS — K2970 Gastritis, unspecified, without bleeding: Secondary | ICD-10-CM | POA: Diagnosis not present

## 2016-07-14 DIAGNOSIS — R634 Abnormal weight loss: Secondary | ICD-10-CM | POA: Diagnosis not present

## 2016-07-14 HISTORY — PX: ESOPHAGOGASTRODUODENOSCOPY (EGD) WITH PROPOFOL: SHX5813

## 2016-07-14 HISTORY — DX: Personal history of other diseases of the digestive system: Z87.19

## 2016-07-14 HISTORY — DX: Headache, unspecified: R51.9

## 2016-07-14 HISTORY — DX: Headache: R51

## 2016-07-14 HISTORY — PX: COLONOSCOPY WITH PROPOFOL: SHX5780

## 2016-07-14 SURGERY — COLONOSCOPY WITH PROPOFOL
Anesthesia: Monitor Anesthesia Care

## 2016-07-14 MED ORDER — LACTATED RINGERS IV SOLN
INTRAVENOUS | Status: DC
  Administered 2016-07-14: 1000 mL via INTRAVENOUS

## 2016-07-14 MED ORDER — LIDOCAINE 2% (20 MG/ML) 5 ML SYRINGE
INTRAMUSCULAR | Status: DC | PRN
Start: 2016-07-14 — End: 2016-07-14
  Administered 2016-07-14: 80 mg via INTRAVENOUS

## 2016-07-14 MED ORDER — SODIUM CHLORIDE 0.9 % IV SOLN: INTRAVENOUS | Status: DC

## 2016-07-14 MED ORDER — PROPOFOL 10 MG/ML IV BOLUS
INTRAVENOUS | Status: DC | PRN
  Administered 2016-07-14 (×10): 20 mg via INTRAVENOUS

## 2016-07-14 MED ORDER — MIDAZOLAM HCL 2 MG/2ML IJ SOLN
INTRAMUSCULAR | Status: AC
  Filled 2016-07-14: qty 2

## 2016-07-14 MED ORDER — PROPOFOL 10 MG/ML IV BOLUS
INTRAVENOUS | Status: AC
  Filled 2016-07-14: qty 20

## 2016-07-14 MED ORDER — MIDAZOLAM HCL 5 MG/5ML IJ SOLN
INTRAMUSCULAR | Status: DC | PRN
  Administered 2016-07-14: 2 mg via INTRAVENOUS

## 2016-07-14 MED ORDER — ONDANSETRON HCL 4 MG/2ML IJ SOLN
INTRAMUSCULAR | Status: DC | PRN
  Administered 2016-07-14: 4 mg via INTRAVENOUS

## 2016-07-14 SURGICAL SUPPLY — 24 items

## 2016-07-14 NOTE — H&P (View-Only) (Signed)
06/23/2016 [name] [id number] 1971-03-12   History of Present Illness:  45 year old female known to Dr. Ardis Hughs and seen by myself earlier this year for complaints of diarrhea that are presumed to be secondary to IBS. Colonoscopy in 03/2014 revealed diverticulum throughout her entire colon, but was otherwise normal and random biopsies negative for microscopic colitis. EGD normal at that time as well.  Was seen by myself in June of this year for complaints of diarrhea, abdominal pain, and weight loss that she thought was infectious.  Has been treated with Cipro/Flagyl, Augmentin, and ivermectin despite negative stool studies. CT of the abdomen and pelvis with contrast was unremarkable.  She returns today with ongoing symptoms after almost 5 months hiatus. Says that she's had a lot of other health issues since she was seen here. Her GI symptoms have continued throughout the last several months. Says that she tried Questran once a day that did not seem to help her diarrhea. Takes dicyclomine 20 mg and feels like that helps to some degree, but has to take it multiple times a day. She reports 60 pound weight loss, but since 2015 she's down about 40 pounds according to our scales. She apparently has been seen by the new surgeon at [facility], Dr. Kae Heller, who would like to do some surgery for her hemorrhoids but told her that she needed a repeat colonoscopy even though she only had one done just 2 years ago that was unremarkable.  Also complains of food getting stuck in her esophagus frequently when trying to eat.   Current Medications, Allergies, Past Medical History, Past Surgical History, Family History and Social History were reviewed in [employer] record.   Physical Exam: BP 128/64 (BP Location: Left Arm, Patient Position: Sitting, Cuff Size: Normal)   Ht 5' (1.524 m)   Wt 135 lb 6.4 oz (61.4 kg)   BMI 26.44 kg/m  General: Well developed white  female in no acute distress Head: Normocephalic and atraumatic Eyes:  Sclerae anicteric, conjunctiva pink  Ears: Normal auditory acuity Lungs: Clear throughout to auscultation Heart: Regular rate and rhythm Abdomen: Soft, non-distended.  Normal bowel sounds.  Diffuse TTP. Rectal:  Will be done at the time of colonoscopy. Musculoskeletal: Symmetrical with no gross deformities  Extremities: No edema  Neurological: Alert oriented x 4, grossly non-focal Psychological:  Alert and cooperative. Normal mood and affect  Assessment and Recommendations: -45 year old female with chronic GI complaints including diarrhea which has been presumed to be IBS. With several months of worsening of her diarrhea associated with weight loss of 40 pounds and diffuse abdominal pain.  Was treated for infectious sources without success. He has been on several antibiotics for other illnesses recently but tells me she had a negative C. difficile last week by her PCP. We will try to obtain those results. Previous colonoscopy with random biopsies  In 03/2014 negative for microscopic colitis. She reports that surgeon from [facility] wants her to have another colonoscopy before she proceeds with any surgery for her hemorrhoids.  Will schedule with Dr. Ardis Hughs.  I still believe that her ongoing diagnosis for her diarrhea is IBS +/- bile salt related.  I'm going to have her discontinue her Dexilant 60 mg daily (can cause diarrhea) and try pantoprazole 40 mg daily instead.  I have also asked her to begin taking Florastor twice daily. -Dysphagia:  Also had a normal EGD in 03/2014, but reports that these issues with swallowing are new so will  schedule repeat EGD as well.  *Just of note, the patient told me that the surgeon was aware that she just had a colonoscopy 2 years ago but then when I received the office visit note from Dr. Kae Heller she reports that she was told by the patient that she does not recall her last colonoscopy as it has been a  while. *The risks, benefits, and alternatives to EGD with possible dilation and colonoscopy were discussed with the patient and she consents to proceed.

## 2016-07-14 NOTE — Transfer of Care (Signed)
Immediate Anesthesia Transfer of Care Note  Patient: Chalea Holthaus Martindelcampo  Procedure(s) Performed: Procedure(s): COLONOSCOPY WITH PROPOFOL (N/A) ESOPHAGOGASTRODUODENOSCOPY (EGD) WITH PROPOFOL (N/A)  Patient Location: PACU and Endoscopy Unit  Anesthesia Type:MAC  Level of Consciousness: awake and alert   Airway & Oxygen Therapy: Patient Spontanous Breathing and Patient connected to nasal cannula oxygen  Post-op Assessment: Report given to RN and Post -op Vital signs reviewed and stable  Post vital signs: Reviewed and stable  Last Vitals:  Vitals:   07/14/16 0836  BP: 108/72  Pulse: 78  Resp: 19  Temp: 36.8 C    Last Pain:  Vitals:   07/14/16 0918  TempSrc:   PainSc: 0-No pain         Complications: No apparent anesthesia complications

## 2016-07-14 NOTE — Anesthesia Preprocedure Evaluation (Addendum)
Anesthesia Evaluation  Patient identified by MRN, date of birth, ID band Patient awake    Reviewed: Allergy & Precautions, NPO status , Patient's Chart, lab work & pertinent test results  Airway Mallampati: II  TM Distance: >3 FB Neck ROM: Full    Dental no notable dental hx. (+) Dental Advisory Given, Partial Upper   Pulmonary Current Smoker,    Pulmonary exam normal breath sounds clear to auscultation       Cardiovascular negative cardio ROS Normal cardiovascular exam Rhythm:Regular Rate:Normal     Neuro/Psych negative neurological ROS  negative psych ROS   GI/Hepatic Neg liver ROS, GERD  ,  Endo/Other  negative endocrine ROS  Renal/GU negative Renal ROS  negative genitourinary   Musculoskeletal negative musculoskeletal ROS (+)   Abdominal   Peds negative pediatric ROS (+)  Hematology negative hematology ROS (+)   Anesthesia Other Findings   Reproductive/Obstetrics negative OB ROS                            Anesthesia Physical Anesthesia Plan  ASA: II  Anesthesia Plan: MAC   Post-op Pain Management:    Induction: Intravenous  Airway Management Planned: Nasal Cannula  Additional Equipment:   Intra-op Plan:   Post-operative Plan:   Informed Consent: I have reviewed the patients History and Physical, chart, labs and discussed the procedure including the risks, benefits and alternatives for the proposed anesthesia with the patient or authorized representative who has indicated his/her understanding and acceptance.   Dental advisory given  Plan Discussed with: CRNA and Surgeon  Anesthesia Plan Comments:         Anesthesia Quick Evaluation

## 2016-07-14 NOTE — Discharge Instructions (Signed)

## 2016-07-14 NOTE — Anesthesia Postprocedure Evaluation (Signed)
Anesthesia Post Note  Patient: [name]  Procedure(s) Performed: Procedure(s) (LRB): COLONOSCOPY WITH PROPOFOL (N/A) ESOPHAGOGASTRODUODENOSCOPY (EGD) WITH PROPOFOL (N/A)  Patient location during evaluation: PACU Anesthesia Type: MAC Level of consciousness: awake and alert Pain management: pain level controlled Vital Signs Assessment: post-procedure vital signs reviewed and stable Respiratory status: spontaneous breathing, nonlabored ventilation, respiratory function stable and patient connected to nasal cannula oxygen Cardiovascular status: stable and blood pressure returned to baseline Anesthetic complications: no    Last Vitals:  Vitals:   07/14/16 1010 07/14/16 1020  BP: (!) 106/59 (!) 104/57  Pulse: 75 70  Resp: 18 11  Temp:      Last Pain:  Vitals:   07/14/16 1000  TempSrc: Oral  PainSc:                  Dylan Ruotolo S

## 2016-07-14 NOTE — Interval H&P Note (Signed)
History and Physical Interval Note:  07/14/2016 8:45 AM  [name]  has presented today for surgery, with the diagnosis of dysphagia, weight loss, diarrhea  The various methods of treatment have been discussed with the patient and family. After consideration of risks, benefits and other options for treatment, the patient has consented to  Procedure(s): COLONOSCOPY WITH PROPOFOL (N/A) ESOPHAGOGASTRODUODENOSCOPY (EGD) WITH PROPOFOL (N/A) as a surgical intervention .  The patient's history has been reviewed, patient examined, no change in status, stable for surgery.  I have reviewed the patient's chart and labs.  Questions were answered to the patient's satisfaction.     Milus Banister

## 2016-07-14 NOTE — Op Note (Signed)
[facility] Patient Name: [name] Procedure Date: 07/14/2016 MRN: [id number] Attending MD: Milus Banister , MD Date of Birth: 04/21/1971 CSN: [id number] Age: 45 Admit Type: Outpatient Procedure:                Colonoscopy Indications:              Chronic diarrhea Providers:                Milus Banister, MD, Laverta Baltimore RN, RN,                            Elspeth Cho Tech., Technician, Courtney Heys                            Armist[place], CRNA Referring MD:              Medicines:                Monitored Anesthesia Care Complications:            No immediate complications. Estimated blood loss:                            Minimal. Estimated Blood Loss:     Estimated blood loss: none. Procedure:                Pre-Anesthesia Assessment:                           - Prior to the procedure, a History and Physical                            was performed, and patient medications and                            allergies were reviewed. The patient's tolerance of                            previous anesthesia was also reviewed. The risks                            and benefits of the procedure and the sedation                            options and risks were discussed with the patient.                            All questions were answered, and informed consent                            was obtained. Prior Anticoagulants: The patient has                            taken no previous anticoagulant or antiplatelet                            agents. ASA Grade Assessment: II - A  patient with                            mild systemic disease. After reviewing the risks                            and benefits, the patient was deemed in                            satisfactory condition to undergo the procedure.                           After obtaining informed consent, the colonoscope                            was passed under direct vision. Throughout the                  procedure, the patient's blood pressure, pulse, and                            oxygen saturations were monitored continuously. The                            [id number]) scope was introduced through                            the anus and advanced to the the terminal ileum.                            The colonoscopy was performed without difficulty.                            The patient tolerated the procedure well. The                            quality of the bowel preparation was good. The                            terminal ileum, ileocecal valve, appendiceal                            orifice, and rectum were photographed. Scope In: 9:26:47 AM Scope Out: 9:36:27 AM Scope Withdrawal Time: 0 hours 6 minutes 15 seconds  Total Procedure Duration: 0 hours 9 minutes 40 seconds  Findings:      The terminal ileum appeared normal.      External hemorrhoids were found. The hemorrhoids were large.      A few scattered diverticulum throughout the colon.      The exam was otherwise without abnormality on direct and retroflexion       views.      Biopsies for histology were taken with a cold forceps from the entire       colon for evaluation of microscopic colitis. Impression:               - The examined portion of the ileum was normal.                           -  Large xternal hemorrhoids.                           - Diverticulosis.                           - The examination was otherwise normal on direct                            and retroflexion views.                           - Biopsies were taken with a cold forceps from the                            entire colon for evaluation of microscopic colitis. Moderate Sedation:      N/A- Per Anesthesia Care Recommendation:           - Patient has a contact number available for                            emergencies. The signs and symptoms of potential                            delayed complications were discussed  with the                            patient. Return to normal activities tomorrow.                            Written discharge instructions were provided to the                            patient.                           - Resume previous diet.                           - Continue present medications.                           - Repeat colonoscopy is recommended. The                            colonoscopy date will be determined after pathology                            results from today's exam become available for                            review.                           - Please start OTC imodium, one pill twice daily  for now. Procedure Code(s):        --- Professional ---                           [phone], Colonoscopy, flexible; with biopsy, single                            or multiple Diagnosis Code(s):        --- Professional ---                           K64.4, Residual hemorrhoidal skin tags                           K52.9, Noninfective gastroenteritis and colitis,                            unspecified CPT copyright 2016 American Medical Association. All rights reserved. The codes documented in this report are preliminary and upon coder review may  be revised to meet current compliance requirements. Milus Banister, MD 07/14/2016 9:53:29 AM This report has been signed electronically. Number of Addenda: 0

## 2016-07-14 NOTE — Op Note (Signed)
[facility] Patient Name: [name] Procedure Date: 07/14/2016 MRN: [id number] Attending MD: Milus Banister , MD Date of Birth: May 25, 1971 CSN: [id number] Age: 45 Admit Type: Outpatient Procedure:                Upper GI endoscopy Indications:              Dysphagia Providers:                Milus Banister, MD, Laverta Baltimore RN, RN,                            Elspeth Cho Tech., Technician, Courtney Heys                            Armistead, CRNA Referring MD:              Medicines:                Monitored Anesthesia Care Complications:            No immediate complications. Estimated blood loss:                            None. Estimated Blood Loss:     Estimated blood loss: none. Procedure:                Pre-Anesthesia Assessment:                           - Prior to the procedure, a History and Physical                            was performed, and patient medications and                            allergies were reviewed. The patient's tolerance of                            previous anesthesia was also reviewed. The risks                            and benefits of the procedure and the sedation                            options and risks were discussed with the patient.                            All questions were answered, and informed consent                            was obtained. Prior Anticoagulants: The patient has                            taken no previous anticoagulant or antiplatelet                            agents. ASA Grade Assessment: II -  A patient with                            mild systemic disease. After reviewing the risks                            and benefits, the patient was deemed in                            satisfactory condition to undergo the procedure.                           After obtaining informed consent, the endoscope was                            passed under direct vision. Throughout the             procedure, the patient's blood pressure, pulse, and                            oxygen saturations were monitored continuously. The                            [id number]) scope was introduced through the                            mouth, and advanced to the second part of duodenum.                            The upper GI endoscopy was accomplished without                            difficulty. The patient tolerated the procedure                            well. Findings:      The esophagus was normal. Biopsies were taken from distal and proximal       esophagus to check for EoE.      Minimal inflammation characterized by erythema was found in the gastric       antrum. Biopsies were taken with a cold forceps for histology.      The examined duodenum was normal. Biopsies were taken from the duodenum       to check for Celiac Sprue Impression:               - Normal esophagus. Biopsied                           - Minimal gastritis. Biopsied.                           - Normal examined duodenum. Biopsied. Moderate Sedation:      N/A- Per Anesthesia Care Recommendation:           - Patient has a contact number available for  emergencies. The signs and symptoms of potential                            delayed complications were discussed with the                            patient. Return to normal activities tomorrow.                            Written discharge instructions were provided to the                            patient.                           - Resume previous diet.                           - Continue present medications.                           - Await pathology results. Procedure Code(s):        --- Professional ---                           [phone], Esophagogastroduodenoscopy, flexible,                            transoral; with biopsy, single or multiple Diagnosis Code(s):        --- Professional ---                            K29.70, Gastritis, unspecified, without bleeding                           R13.10, Dysphagia, unspecified CPT copyright 2016 American Medical Association. All rights reserved. The codes documented in this report are preliminary and upon coder review may  be revised to meet current compliance requirements. Milus Banister, MD 07/14/2016 9:56:18 AM This report has been signed electronically. Number of Addenda: 0

## 2016-07-15 ENCOUNTER — Encounter (HOSPITAL_COMMUNITY): Payer: Self-pay | Admitting: Gastroenterology

## 2016-07-18 ENCOUNTER — Telehealth: Payer: Self-pay | Admitting: *Deleted

## 2016-07-18 ENCOUNTER — Telehealth: Payer: Self-pay | Admitting: Gastroenterology

## 2016-07-18 ENCOUNTER — Other Ambulatory Visit: Payer: Self-pay | Admitting: Certified Nurse Midwife

## 2016-07-18 DIAGNOSIS — L900 Lichen sclerosus et atrophicus: Secondary | ICD-10-CM

## 2016-07-18 NOTE — Telephone Encounter (Signed)
Please let her know that [facility]: [address]  [phone].  Thank you.  R.Denae Zulueta CNM

## 2016-07-18 NOTE — Telephone Encounter (Signed)
See results note. 

## 2016-07-18 NOTE — Telephone Encounter (Signed)
Patient would like another Rx for rectal rocket from [facility].

## 2016-07-19 MED ORDER — CLOBETASOL PROPIONATE 0.05 % EX OINT: 1.0000 "application " | TOPICAL_OINTMENT | Freq: Two times a day (BID) | CUTANEOUS | 0 refills | Status: DC

## 2016-07-19 NOTE — Telephone Encounter (Signed)
Pt's spouse called answering service requesting pt's results from yesterday's phone call encounter.

## 2016-07-19 NOTE — Telephone Encounter (Signed)
Patient notified

## 2016-07-27 ENCOUNTER — Encounter: Payer: Self-pay | Admitting: Genetic Counselor

## 2016-07-27 ENCOUNTER — Ambulatory Visit (HOSPITAL_BASED_OUTPATIENT_CLINIC_OR_DEPARTMENT_OTHER): Payer: Medicare Other | Admitting: Genetic Counselor

## 2016-07-27 ENCOUNTER — Other Ambulatory Visit: Payer: Medicare Other

## 2016-07-27 DIAGNOSIS — Z85820 Personal history of malignant melanoma of skin: Secondary | ICD-10-CM

## 2016-07-27 DIAGNOSIS — Z8041 Family history of malignant neoplasm of ovary: Secondary | ICD-10-CM | POA: Diagnosis not present

## 2016-07-27 DIAGNOSIS — Z8042 Family history of malignant neoplasm of prostate: Secondary | ICD-10-CM | POA: Diagnosis not present

## 2016-07-27 DIAGNOSIS — Z808 Family history of malignant neoplasm of other organs or systems: Secondary | ICD-10-CM

## 2016-07-27 DIAGNOSIS — Z8049 Family history of malignant neoplasm of other genital organs: Secondary | ICD-10-CM

## 2016-07-27 DIAGNOSIS — Z315 Encounter for genetic counseling: Secondary | ICD-10-CM

## 2016-07-27 NOTE — Progress Notes (Signed)
REFERRING PROVIDER: Delia Chimes, NP [address] [place] [place], [place] [address]   [name], CNW  PRIMARY PROVIDER:  Delia Chimes, NP  PRIMARY REASON FOR VISIT:  1. MELANOMA, TRUNK, HX OF   2. Family history of ovarian cancer   3. Family history of uterine cancer   4. Family history of prostate cancer      HISTORY OF PRESENT ILLNESS:   [name], a 45 y.o. female, was seen for a [place] cancer genetics consultation at the request of [name] due to a personal and family history of cancer.  [name] presents to clinic today to discuss the possibility of a hereditary predisposition to cancer, genetic testing, and to further clarify her future cancer risks, as well as potential cancer risks for family members.   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 This was treated with surgical excision.   She has a history of alcohol and drug abuse and this was during that time.  She does not remember specifics about it.  The patient has a recent history of a 50# weight loss and loss of hair.  We discussed that while weight loss can be a symptom of cancer, that the testing we can provide will not diagnose her with cancer but only tell us if there is a hereditary cause for the cancer in her family and how we can follow her differently from here on out.  She voiced her understanding.   CANCER HISTORY:   No history exists.     HORMONAL RISK FACTORS:  Menarche was at age 38.  First live birth at age N/A.  OCP use for approximately 0 years.  Ovaries intact: yes.  Hysterectomy: no.  Menopausal status: postmenopausal.  HRT use: 0 years. Colonoscopy: yes; no polyps. Mammogram within the last year: yes. Number of breast biopsies: 0. Up to date with pelvic exams:  yes. Any excessive radiation exposure in the past:  no  Past Medical History:  Diagnosis Date  . Anxiety   . Arthritis    left knee  . Cancer ([place])    melanoma removed  from back   . Depression   . Diverticulosis   . Erosive esophagitis   . Family history of ovarian cancer   . Family history of prostate cancer   . Family history of uterine cancer   . 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 GERD (gastroesophageal reflux disease)   . Headache   . History of hiatal hernia   . IBS (irritable bowel syndrome)    no current med.  [name] MRSA (methicillin resistant Staphylococcus aureus)     Past Surgical History:  Procedure Laterality Date  . CHOLECYSTECTOMY  08/02/2012   Procedure: LAPAROSCOPIC CHOLECYSTECTOMY WITH INTRAOPERATIVE CHOLANGIOGRAM;  Surgeon: Rolm Bookbinder, MD;  Location: [facility];  Service: General;  Laterality: N/A;  . COLONOSCOPY WITH PROPOFOL  04/09/2014  . COLONOSCOPY WITH PROPOFOL N/A 07/14/2016   Procedure: COLONOSCOPY WITH PROPOFOL;  Surgeon: Milus Banister, MD;  Location: [facility];  Service: Endoscopy;  Laterality: N/A;  . ESOPHAGOGASTRODUODENOSCOPY (EGD) WITH PROPOFOL  04/09/2014  . ESOPHAGOGASTRODUODENOSCOPY (EGD) WITH PROPOFOL N/A 07/14/2016   Procedure: ESOPHAGOGASTRODUODENOSCOPY (EGD) WITH PROPOFOL;  Surgeon: Milus Banister, MD;  Location: [facility];  Service: Endoscopy;  Laterality: N/A;  . HYSTEROSCOPY W/D&C  06/22/2012   Procedure: DILATATION AND CURETTAGE /HYSTEROSCOPY;  Surgeon: Lahoma Crocker, MD;  Location: [facility];  Service:  Gynecology;  Laterality: N/A;  . KNEE ARTHROSCOPY  2011   left  . ORIF TOE FRACTURE Left 11/05/2014   Procedure: OPEN REDUCTION INTERNAL FIXATION (ORIF) LEFT FIFTH METATARSAL (TOE) FRACTURE WITH CALCANEAL BONE GRAFT;  Surgeon: Dorna Leitz, MD;  Location: [facility];  Service: Orthopedics;  Laterality: Left;  . WISDOM TOOTH EXTRACTION      Social History   Social History  . Marital status: Married    Spouse name: N/A  . Number of children: 0  . Years of education: N/A   Social History Main Topics  . Smoking status: Current Every Day Smoker     Packs/day: 0.50    Years: 33.00    Types: Cigarettes  . Smokeless tobacco: Never Used     Comment: form given 02-04-16   . Alcohol use No  . Drug use: No     Comment: history of use  . Sexual activity: Yes    Birth control/ protection: None   Other Topics Concern  . None   Social History Narrative  . None     FAMILY HISTORY:  We obtained a detailed, 4-generation family history.  Significant diagnoses are listed below: Family History  Problem Relation Age of Onset  . Heart disease Father   . Stroke Father   . Heart attack Father   . Skin cancer Father   . Uterine cancer Mother 52  . Cancer - Colon Maternal Grandfather     mets to bone  . Stomach cancer Paternal Grandmother   . Cervical cancer Sister     dx between 93-33  . Prostate cancer Maternal Uncle 59    mets to bone  . Ovarian cancer Paternal Aunt   . Heart attack Paternal Grandfather   . Skin cancer Paternal Aunt     The patient does not have children.  She has one sister who had cervical cancer un her 22's.  Her mother was diagnosed with uterine cancer at 73.  Her mother has one brother who was diagnosed with prostate cancer that is now metastatic.  The patient's grandfather was diagnosed with widespread cancer that went to his bones.  She was unclear of where it started, but her medical records indicate possible colon cancer.  More distant relatives on this side of the family had ovarian, melanoma and "female cancer".    The patient's father had skin cancer but died from complications of a stroke.  He had three sisters, one who had skin cancer and another who had ovarian cancer.  His mother died of stomach cancer and several of her siblings had cancer of unknown types.  Patient's maternal ancestors are of European descent, and paternal ancestors are of [place] and [place] descent. There is no reported Ashkenazi Jewish ancestry. There is no known consanguinity.  GENETIC COUNSELING ASSESSMENT: [name] is a 45 y.o. female with a personal history of melanoma and family history of ovarian and uterine cancer which is somewhat suggestive of a hereditary cancer syndrome and predisposition to cancer. We, therefore, discussed and recommended the following at today's visit.   DISCUSSION: We discussed her mother's uterine cancer and that about 3% of uterine cancers are hereditary with most due to Lynch syndrome.  Sometimes hereditary uterine cancers can be due to mutations in PTEN.  Based on her paternal aunt's diagnosis of ovarian cancer we also reviewed BRCA mutations.  We reviewed the characteristics, features and inheritance patterns of hereditary cancer syndromes. We also discussed genetic testing, including  the appropriate family members to test, the process of testing, insurance coverage and turn-around-time for results. We discussed the implications of a negative, positive and/or variant of uncertain significant result. We recommended Ms. Ousley pursue genetic testing for the custom panel that includes genes from the breast/ovarian cancer panel, endometrial cancer panel and melanoma genes including BAP1, CDK4, CDKN2A, and MITF.   Based on Ms. Piehl's personal and family history of cancer, she meets medical criteria for genetic testing. Despite that she meets criteria, she may still have an out of pocket cost. We discussed that if her out of pocket cost for testing is over $100, the laboratory will call and confirm whether she wants to proceed with testing.  If the out of pocket cost of testing is less than $100 she will be billed by the genetic testing laboratory.   PLAN: After considering the risks, benefits, and limitations, Ms. Vuncannon  provided informed consent to pursue genetic testing and the blood sample was sent to [facility] for analysis of the custom gene panel. Results should be available within approximately 2-3 weeks' time, at which point they will be disclosed by telephone to Ms.  Beeney, as will any additional recommendations warranted by these results. Ms. Sweigert will receive a summary of her genetic counseling visit and a copy of her results once available. This information will also be available in Epic. We encouraged Ms. Hagarty to remain in contact with cancer genetics annually so that we can continuously update the family history and inform her of any changes in cancer genetics and testing that may be of benefit for her family. Ms. Fukushima questions were answered to her satisfaction today. Our contact information was provided should additional questions or concerns arise.  Lastly, we encouraged Ms. Laws to remain in contact with cancer genetics annually so that we can continuously update the family history and inform her of any changes in cancer genetics and testing that may be of benefit for this family.   Ms.  Neault questions were answered to her satisfaction today. Our contact information was provided should additional questions or concerns arise. Thank you for the referral and allowing [place] to share in the care of your patient.   Jelani Vreeland P. Florene Glen, [place], [facility] Certified Genetic Counselor [email or web address] phone: [phone]  The patient was seen for a total of 45 minutes in face-to-face genetic counseling.  This patient was discussed with Drs. Magrinat, Lindi Adie and/or Burr Medico who agrees with the above.    _______________________________________________________________________ For Office Staff:  Number of people involved in session: 1 Was an Intern/ student involved with case: no

## 2016-08-01 ENCOUNTER — Other Ambulatory Visit: Payer: Self-pay | Admitting: Gastroenterology

## 2016-08-01 DIAGNOSIS — R1084 Generalized abdominal pain: Secondary | ICD-10-CM

## 2016-08-01 DIAGNOSIS — R197 Diarrhea, unspecified: Secondary | ICD-10-CM

## 2016-08-01 DIAGNOSIS — R634 Abnormal weight loss: Secondary | ICD-10-CM

## 2016-08-01 MED ORDER — DICYCLOMINE HCL 10 MG PO CAPS: 20.0000 mg | ORAL_CAPSULE | Freq: Three times a day (TID) | ORAL | 0 refills | Status: DC

## 2016-08-01 NOTE — Telephone Encounter (Signed)
Received a fax refill from [facility] for dicyclomine 10 mg 2 capsules 3 times a day. Per Alonza Bogus, PA ok to give 90 day supply. # [phone] sent electronically.

## 2016-08-03 ENCOUNTER — Encounter: Payer: Self-pay | Admitting: Certified Nurse Midwife

## 2016-08-24 ENCOUNTER — Encounter: Payer: Self-pay | Admitting: Genetic Counselor

## 2016-08-24 ENCOUNTER — Telehealth: Payer: Self-pay | Admitting: Genetic Counselor

## 2016-08-24 DIAGNOSIS — Z1379 Encounter for other screening for genetic and chromosomal anomalies: Secondary | ICD-10-CM | POA: Insufficient documentation

## 2016-08-24 NOTE — Telephone Encounter (Signed)
Revealed negative genetic testing on the Custom panel through [facility].  Discussed that we do not know why she has melanoma or why there are other cancers in the family.  There could be additional testing in the future.  Please keep in touch in the future.

## 2016-08-31 ENCOUNTER — Encounter: Payer: Medicare Other | Admitting: Gastroenterology

## 2016-08-31 ENCOUNTER — Ambulatory Visit: Payer: Self-pay | Admitting: Genetic Counselor

## 2016-08-31 DIAGNOSIS — Z1379 Encounter for other screening for genetic and chromosomal anomalies: Secondary | ICD-10-CM

## 2016-08-31 DIAGNOSIS — Z8042 Family history of malignant neoplasm of prostate: Secondary | ICD-10-CM

## 2016-08-31 DIAGNOSIS — Z8041 Family history of malignant neoplasm of ovary: Secondary | ICD-10-CM

## 2016-08-31 DIAGNOSIS — Z8049 Family history of malignant neoplasm of other genital organs: Secondary | ICD-10-CM

## 2016-08-31 NOTE — Progress Notes (Signed)
HPI: [name] was previously seen in the [place] [facility] due to a personal and family history of cancer and concerns regarding a hereditary predisposition to cancer. Please refer to our prior cancer genetics clinic note for more information regarding [name]'s medical, social and family histories, and our assessment and recommendations, at the time. [name] recent genetic test results were disclosed to her, as were recommendations warranted by these results. These results and recommendations are discussed in more detail below.  CANCER HISTORY:   No history exists.    FAMILY HISTORY:  We obtained a detailed, 4-generation family history.  Significant diagnoses are listed below: Family History  Problem Relation Age of Onset  . Heart disease Father   . Stroke Father   . Heart attack Father   . Skin cancer Father   . Uterine cancer Mother 69  . Cancer - Colon Maternal Grandfather     mets to bone  . Stomach cancer Paternal Grandmother   . Cervical cancer Sister     dx between 67-33  . Prostate cancer Maternal Uncle 59    mets to bone  . Ovarian cancer Paternal Aunt   . Heart attack Paternal Grandfather   . Skin cancer Paternal Aunt     The patient does not have children.  She has one sister who had cervical cancer un her 82's.  Her mother was diagnosed with uterine cancer at 22.  Her mother has one brother who was diagnosed with prostate cancer that is now metastatic.  The patient's grandfather was diagnosed with widespread cancer that went to his bones.  She was unclear of where it started, but her medical records indicate possible colon cancer.  More distant relatives on this side of the family had ovarian, melanoma and "female cancer".    The patient's father had skin cancer but died from complications of a stroke.  He had three sisters, one who had skin cancer and another who had ovarian cancer.  His mother died of stomach cancer and several of her siblings  had cancer of unknown types.  Patient's maternal ancestors are of European descent, and paternal ancestors are of [place] and [place] descent. There is no reported Ashkenazi Jewish ancestry. There is no known consanguinity.  GENETIC TEST RESULTS: Genetic testing reported out on August 17, 2016 through the Custom cancer panel and MSH2 inversion analysis found no deleterious mutations.  The Custom cancer gene panel offered by [facility] includes sequencing and rearrangement analysis for the following 26 genes:  ATM,BAP1, BARD1, BRCA1, BRCA2, BRIP1, CDH1, CDK4, CDKN2A, CHEK2, EPCAM, FANCC, MITF, MLH1, MSH2, MSH6, MUTYH, NBN, PALB2, PMS2,POLD1, PTEN, RAD51C, RAD51D, TP53, and XRCC2.   The test report has been scanned into EPIC and is located under the Molecular Pathology section of the Results Review tab.   We discussed with [name] that since the current genetic testing is not perfect, it is possible there may be a gene mutation in one of these genes that current testing cannot detect, but that chance is small. We also discussed, that it is possible that another gene that has not yet been discovered, or that we have not yet tested, is responsible for the cancer diagnoses in the family, and it is, therefore, important to remain in touch with cancer genetics in the future so that we can continue to offer [name] the most up to date genetic testing.   CANCER SCREENING RECOMMENDATIONS: This result is reassuring and indicates that [name] likely  does not have an increased risk for a future cancer due to a mutation in one of these genes. This normal test also suggests that [name]'s cancer was most likely not due to an inherited predisposition associated with one of these genes.  Most cancers happen by chance and this negative test suggests that her cancer falls into this category.  We, therefore, recommended she continue to follow the cancer management and screening guidelines provided by her  oncology and primary healthcare provider.   RECOMMENDATIONS FOR FAMILY MEMBERS: Women in this family might be at some increased risk of developing cancer, over the general population risk, simply due to the family history of cancer. We recommended women in this family have a yearly mammogram beginning at age 42, or 21 years younger than the earliest onset of cancer, an annual clinical breast exam, and perform monthly breast self-exams. Women in this family should also have a gynecological exam as recommended by their primary provider. All family members should have a colonoscopy by age 75.  FOLLOW-UP: Lastly, we discussed with [name] that cancer genetics is a rapidly advancing field and it is possible that new genetic tests will be appropriate for her and/or her family members in the future. We encouraged her to remain in contact with cancer genetics on an annual basis so we can update her personal and family histories and let her know of advances in cancer genetics that may benefit this family.   Our contact number was provided. [name] questions were answered to her satisfaction, and she knows she is welcome to call us at anytime with additional questions or concerns.   Roma Kayser, MS, [facility] Certified Genetic Counselor [email or web address]

## 2016-09-12 ENCOUNTER — Ambulatory Visit (AMBULATORY_SURGERY_CENTER): Payer: Self-pay

## 2016-09-12 VITALS — Ht 62.25 in | Wt 137.8 lb

## 2016-09-12 DIAGNOSIS — K200 Eosinophilic esophagitis: Secondary | ICD-10-CM

## 2016-09-12 NOTE — Progress Notes (Signed)
No allergies to eggs or soy No past problems with anesthesia No diet meds No home oxygen  Declined emmi 

## 2016-09-19 ENCOUNTER — Encounter: Payer: Self-pay | Admitting: Gastroenterology

## 2016-09-19 ENCOUNTER — Ambulatory Visit (AMBULATORY_SURGERY_CENTER): Payer: Medicare Other | Admitting: Gastroenterology

## 2016-09-19 VITALS — BP 96/46 | HR 63 | Temp 98.6°F | Resp 10 | Ht 62.25 in | Wt 137.0 lb

## 2016-09-19 DIAGNOSIS — K200 Eosinophilic esophagitis: Secondary | ICD-10-CM | POA: Diagnosis not present

## 2016-09-19 DIAGNOSIS — D721 Eosinophilia: Secondary | ICD-10-CM | POA: Diagnosis not present

## 2016-09-19 DIAGNOSIS — R1310 Dysphagia, unspecified: Secondary | ICD-10-CM

## 2016-09-19 MED ORDER — SODIUM CHLORIDE 0.9 % IV SOLN: 500.0000 mL | INTRAVENOUS | Status: DC

## 2016-09-19 NOTE — Progress Notes (Signed)
Called to room to assist during endoscopic procedure.  Patient ID and intended procedure confirmed with present staff. Received instructions for my participation in the procedure from the performing physician.  

## 2016-09-19 NOTE — Patient Instructions (Signed)
YOU HAD AN ENDOSCOPIC PROCEDURE TODAY AT [facility]:   Refer to the procedure report that was given to you for any specific questions about what was found during the examination.  If the procedure report does not answer your questions, please call your gastroenterologist to clarify.  If you requested that your care partner not be given the details of your procedure findings, then the procedure report has been included in a sealed envelope for you to review at your convenience later.  YOU SHOULD EXPECT: Some feelings of bloating in the abdomen. Passage of more gas than usual.  Walking can help get rid of the air that was put into your GI tract during the procedure and reduce the bloating.   Please Note:  You might notice some irritation and congestion in your nose or some drainage.  This is from the oxygen used during your procedure.  There is no need for concern and it should clear up in a day or so.  SYMPTOMS TO REPORT IMMEDIATELY:   Following upper endoscopy (EGD)  Vomiting of blood or coffee ground material  New chest pain or pain under the shoulder blades  Painful or persistently difficult swallowing  New shortness of breath  Fever of 100F or higher  Black, tarry-looking stools  For urgent or emergent issues, a gastroenterologist can be reached at any hour by calling [phone].   DIET:  We do recommend a small meal at first, but then you may proceed to your regular diet.  Drink plenty of fluids but you should avoid alcoholic beverages for 24 hours.  ACTIVITY:  You should plan to take it easy for the rest of today and you should NOT DRIVE or use heavy machinery until tomorrow (because of the sedation medicines used during the test).    FOLLOW UP: Our staff will call the number listed on your records the next business day following your procedure to check on you and address any questions or concerns that you may have regarding the information given to you following  your procedure. If we do not reach you, we will leave a message.  However, if you are feeling well and you are not experiencing any problems, there is no need to return our call.  We will assume that you have returned to your regular daily activities without incident.  If any biopsies were taken you will be contacted by phone or by letter within the next 1-3 weeks.  Please call us at [phone] if you have not heard about the biopsies in 3 weeks.    SIGNATURES/CONFIDENTIALITY: You and/or your care partner have signed paperwork which will be entered into your electronic medical record.  These signatures attest to the fact that that the information above on your After Visit Summary has been reviewed and is understood.  Full responsibility of the confidentiality of this discharge information lies with you and/or your care-partner.  Read all of the handouts given to you by your recovery room nurse.  Thank-you for choosing [place] for your healthcare needs today.

## 2016-09-19 NOTE — Op Note (Signed)
[facility] Patient Name: [name] Procedure Date: 09/19/2016 9:31 AM MRN: [id number] Endoscopist: Milus Banister , MD Age: 46 Referring MD:  Date of Birth: Jan 13, 1971 Gender: Female Account #: [id number] Procedure:                Upper GI endoscopy Indications:              Dysphagia; EGD 06/2016 normal esophagus but                            biopsies suggested EoE; has been on BID PPI since                            then Medicines:                Monitored Anesthesia Care Procedure:                Pre-Anesthesia Assessment:                           - Prior to the procedure, a History and Physical                            was performed, and patient medications and                            allergies were reviewed. The patient's tolerance of                            previous anesthesia was also reviewed. The risks                            and benefits of the procedure and the sedation                            options and risks were discussed with the patient.                            All questions were answered, and informed consent                            was obtained. Prior Anticoagulants: The patient has                            taken no previous anticoagulant or antiplatelet                            agents. ASA Grade Assessment: II - A patient with                            mild systemic disease. After reviewing the risks                            and benefits, the patient was deemed in  satisfactory condition to undergo the procedure.                           After obtaining informed consent, the endoscope was                            passed under direct vision. Throughout the                            procedure, the patient's blood pressure, pulse, and                            oxygen saturations were monitored continuously. The                            Model GIF-[id number]) scope was introduced                           through the mouth, and advanced to the second part                            of duodenum. The upper GI endoscopy was                            accomplished without difficulty. The patient                            tolerated the procedure well. Scope In: Scope Out: Findings:                 The esophagus was normal.                           The stomach was normal.                           The examined duodenum was normal.                           Biopsies were taken with a cold forceps in the                            proximal esophagus and in the distal esophagus for                            histology. Complications:            No immediate complications. Estimated blood loss:                            None. Estimated Blood Loss:     Estimated blood loss: none. Impression:               - Normal esophagus.                           - Normal stomach.                           -  Normal examined duodenum.                           - Biopsies were taken with a cold forceps for                            histology in the proximal esophagus and in the                            distal esophagus to check for persently elevated                            eosinophils after 2 months of twice daily PPI. Recommendation:           - Patient has a contact number available for                            emergencies. The signs and symptoms of potential                            delayed complications were discussed with the                            patient. Return to normal activities tomorrow.                            Written discharge instructions were provided to the                            patient.                           - Resume previous diet.                           - Continue present medications.                           - Await pathology results. For now continue twice                            daily protonix. Milus Banister, MD 09/19/2016  9:44:32 AM This report has been signed electronically.

## 2016-09-19 NOTE — Progress Notes (Signed)
Report to PACU, RN, vss, BBS= Clear.  

## 2016-09-20 ENCOUNTER — Telehealth: Payer: Self-pay | Admitting: *Deleted

## 2016-09-20 ENCOUNTER — Telehealth: Payer: Self-pay | Admitting: Gastroenterology

## 2016-09-20 NOTE — Telephone Encounter (Signed)
  Follow up Call-  Call back number 04/09/2014  Post procedure Call Back phone  # [phone] cell  Permission to leave phone message Yes  Some encounter information is confidential and restricted. Go to Review Flowsheets activity to see all data.  Some recent data might be hidden     Patient questions:  Do you have a fever, pain , or abdominal swelling?yes Pain score  7  Have you tolerated food without any problems?no Have you been able to return to your normal activities? yes  Do you have any questions about your discharge instructions: Diet  no Medications  No. Follow up visit  No.  Do you have questions or concerns about your Care? np  Actions:  * If pain score is 4 or above: Physician/ provider Notified : Owens Loffler, MD.   Pt complains of pain when swallowing.  She rates as 7.  No pain with liquids.  She states she "had some chills Last evening.  No fever today.  She wonders if this pain is normal

## 2016-09-20 NOTE — Telephone Encounter (Signed)
The pain is not typical but probably nothing to worry about. May just be from scope irritation on throat.  She should call if not better in 1-2 days or if it signficantly worsens.

## 2016-09-20 NOTE — Telephone Encounter (Signed)
Spoke with pt.   Advised her that Dr. Ardis Hughs made aware of her concern/pain level.  He advised that she call back in 1-2 days if Pain is not resolved or if it worsens.  She verbalized understanding.

## 2016-09-20 NOTE — Telephone Encounter (Signed)
  Follow up Call-  Call back number 04/09/2014  Post procedure Call Back phone  # [phone] cell  Permission to leave phone message Yes  Some encounter information is confidential and restricted. Go to Review Flowsheets activity to see all data.  Some recent data might be hidden    No answer, left message to call if questions or concerns.

## 2016-11-16 ENCOUNTER — Emergency Department (HOSPITAL_BASED_OUTPATIENT_CLINIC_OR_DEPARTMENT_OTHER)
Admission: EM | Admit: 2016-11-16 | Discharge: 2016-11-16 | Disposition: A | Discharge status: Home / Self Care | Payer: Medicare Other | Attending: Emergency Medicine | Admitting: Emergency Medicine

## 2016-11-16 ENCOUNTER — Encounter (HOSPITAL_BASED_OUTPATIENT_CLINIC_OR_DEPARTMENT_OTHER): Payer: Self-pay | Admitting: Emergency Medicine

## 2016-11-16 DIAGNOSIS — F13230 Sedative, hypnotic or anxiolytic dependence with withdrawal, uncomplicated: Secondary | ICD-10-CM | POA: Insufficient documentation

## 2016-11-16 DIAGNOSIS — F17210 Nicotine dependence, cigarettes, uncomplicated: Secondary | ICD-10-CM | POA: Diagnosis not present

## 2016-11-16 DIAGNOSIS — R197 Diarrhea, unspecified: Secondary | ICD-10-CM | POA: Diagnosis present

## 2016-11-16 DIAGNOSIS — F13930 Sedative, hypnotic or anxiolytic use, unspecified with withdrawal, uncomplicated: Secondary | ICD-10-CM

## 2016-11-16 MED ORDER — CLONAZEPAM 0.5 MG PO TABS: 0.5000 mg | ORAL_TABLET | Freq: Two times a day (BID) | ORAL | 0 refills | Status: DC | PRN

## 2016-11-16 MED FILL — clonazePAM 0.5 MG TABS: 0.5 | 15 days supply | Qty: 30 | Fill #0

## 2016-11-16 NOTE — ED Notes (Signed)
ED Provider at bedside. 

## 2016-11-16 NOTE — ED Provider Notes (Signed)
[facility] DEPT [facility] Provider Note   CSN: [id number] Arrival date & time: 11/16/16  1633     History   Chief Complaint Chief Complaint  Patient presents with  . medication withdrawal.    HPI [name] is a 46 y.o. female.  HPI Patient states she was on clonazepam for 3 years. She reports her provider changed practices and now she is no longer available. Patient reports that she ran out of clonazepam 2 weeks ago. She states she thought she could just stop taking it but symptoms have been getting worse instead of better. She reports she is feeling very jittery, as she states she feels like she is having abdominal cramps and intermittent diarrhea and she just doesn't feel like herself. She reports she feels very bad and she wants to discontinue this medication but the symptoms right now are escalating rather than abating. She states she would like to both get another family doctor who takes her insurance and also get treatment so that she does not need to take the clonazepam anymore. Past Medical History:  Diagnosis Date  . Anxiety   . Arthritis    left knee  . Astigmatism    both eyes  . Cancer ([place])    melanoma removed from back   . Cataract    left eye  . Depression   . Diverticulosis   . Erosive esophagitis   . Family history of ovarian cancer   . Family history of prostate cancer   . Family history of uterine cancer   . 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 GERD (gastroesophageal reflux disease)   . H/O urinary infection   . Headache   . History of hiatal hernia   . IBS (irritable bowel syndrome)    no current med.  . Jaundice   . MRSA (methicillin resistant Staphylococcus aureus)    hospitalized for 48 hours in Dec 2016  . Staph infection    "in my blood"    Patient Active Problem List   Diagnosis Date Noted  . Genetic testing 08/24/2016  . Family history of ovarian cancer   . Family history of uterine cancer     . Family history of prostate cancer   . Dysphagia 06/23/2016  . Cellulitis 03/02/2016  . Loss of weight 02/04/2016  . Diarrhea of presumed infectious origin 02/04/2016  . Generalized abdominal pain 02/04/2016  . Internal hemorrhoid, bleeding 04/24/2014  . Irregular menstrual cycle 06/22/2012  . TOBACCO ABUSE 10/19/2009  . SYNCOPE 10/19/2009  . CHEST PAIN 10/19/2009  . SYNCOPE, HX OF 10/19/2009  . CANDIDIASIS OF THE ESOPHAGUS 06/17/2009  . LEG PAIN, LEFT 12/22/2008  . HAIR LOSS 06/03/2008  . HEMORRHOIDS, INTERNAL, WITH BLEEDING 11/01/2007  . VITAMIN B12 DEFICIENCY 09/05/2007  . DYSPLASTIC NEVUS 08/14/2007  . THROMBOCYTOSIS 07/03/2007  . Diarrhea 07/03/2007  . 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 HYPERCHOLESTEROLEMIA 05/31/2007  . DISORDER, BIPOLAR NEC 05/31/2007  . PSORIASIS 05/31/2007  . INSOMNIA 05/31/2007  . COCAINE ABUSE 05/14/2007  . PERIODONTAL DISEASE 05/14/2007  . Gastritis and gastroduodenitis 05/14/2007  . DIVERTICULOSIS, COLON, HX OF 05/14/2007  . MYALGIA, HX OF 05/14/2007    Past Surgical History:  Procedure Laterality Date  . CHOLECYSTECTOMY  08/02/2012   Procedure: LAPAROSCOPIC CHOLECYSTECTOMY WITH INTRAOPERATIVE CHOLANGIOGRAM;  Surgeon: Rolm Bookbinder, MD;  Location: [facility];  Service: General;  Laterality: N/A;  . COLONOSCOPY WITH PROPOFOL  04/09/2014  . COLONOSCOPY WITH PROPOFOL N/A 07/14/2016  Procedure: COLONOSCOPY WITH PROPOFOL;  Surgeon: Milus Banister, MD;  [facility];  Service: Endoscopy;  Laterality: N/A;  . ESOPHAGOGASTRODUODENOSCOPY (EGD) WITH PROPOFOL  04/09/2014  . ESOPHAGOGASTRODUODENOSCOPY (EGD) WITH PROPOFOL N/A 07/14/2016   Procedure: ESOPHAGOGASTRODUODENOSCOPY (EGD) WITH PROPOFOL;  Surgeon: Milus Banister, MD;  [facility];  Service: Endoscopy;  Laterality: N/A;  . HYSTEROSCOPY W/D&C  06/22/2012   Procedure: DILATATION AND CURETTAGE /HYSTEROSCOPY;  Surgeon: Lahoma Crocker, MD;  Location: [facility];  Service:  Gynecology;  Laterality: N/A;  . KNEE ARTHROSCOPY  2011   left  . ORIF TOE FRACTURE Left 11/05/2014   Procedure: OPEN REDUCTION INTERNAL FIXATION (ORIF) LEFT FIFTH METATARSAL (TOE) FRACTURE WITH CALCANEAL BONE GRAFT;  Surgeon: Dorna Leitz, MD;  Location: [facility];  Service: Orthopedics;  Laterality: Left;  . WISDOM TOOTH EXTRACTION      OB History    No data available       Home Medications    Prior to Admission medications   Medication Sig Start Date End Date Taking? Authorizing Provider  Biotin 1000 MCG tablet Take 1,000 mcg by mouth 2 (two) times daily.    Historical Provider, MD  cholestyramine Lucrezia Starch) 4 g packet  08/31/16   Historical Provider, MD  clobetasol ointment (TEMOVATE) 4.13 % Apply 1 application topically 2 (two) times daily. 07/19/16   Rachelle A Denney, CNM  clonazePAM (KLONOPIN) 0.5 MG tablet Take 1 tablet (0.5 mg total) by mouth 2 (two) times daily as needed for anxiety. Take 0.5 two to three times daily for 3 days, then 0.5 no more than twice daily for 3 days, then 0.5 daily for 3 days, then 0.25 daily for 3 days. 11/16/16   Charlesetta Shanks, MD  clonazePAM (KLONOPIN) 1 MG tablet Take 1 mg by mouth 3 (three) times daily as needed for anxiety.  09/14/15   Historical Provider, MD  dexlansoprazole (DEXILANT) 60 MG capsule Take 60 mg by mouth daily.    Historical Provider, MD  dicyclomine (BENTYL) 10 MG capsule Take 2 capsules (20 mg total) by mouth 3 (three) times daily. 08/01/16   Laban Emperor Zehr, PA-C  diphenhydrAMINE (BENADRYL) 25 mg capsule Take 25 mg by mouth at bedtime as needed for itching (rash).     Historical Provider, MD  doxepin (SINEQUAN) 10 MG capsule Take 10 mg by mouth at bedtime.    Historical Provider, MD  loperamide (IMODIUM) 2 MG capsule Take 2 mg by mouth as needed for diarrhea or loose stools.    Historical Provider, MD  pantoprazole (PROTONIX) 40 MG tablet Take 1 tablet (40 mg total) by mouth daily. 06/22/16   Laban Emperor Zehr, PA-C    promethazine (PHENERGAN) 25 MG tablet Take 1 tablet (25 mg total) by mouth every 6 (six) hours as needed for nausea or vomiting. 09/22/15   Manya Silvas, CNM  saccharomyces boulardii (FLORASTOR) 250 MG capsule Take 1 capsule (250 mg total) by mouth 2 (two) times daily. Patient not taking: Reported on 09/19/2016 06/22/16   Laban Emperor Zehr, PA-C  Simethicone (GAS-X PO) Take 1 tablet by mouth 2 (two) times daily as needed (gas).    Historical Provider, MD  traZODone (DESYREL) 50 MG tablet Take 50 mg by mouth at bedtime.    Historical Provider, MD  triamcinolone ointment (KENALOG) 0.1 % Apply 1 application topically 4 (four) times daily. 06/14/16   Rachelle A Denney, CNM  zolpidem (AMBIEN) 10 MG tablet Take 10 mg by mouth at bedtime. 09/14/15   Historical Provider, MD  Family History Family History  Problem Relation Age of Onset  . Heart disease Father   . Stroke Father   . Heart attack Father   . Skin cancer Father   . Uterine cancer Mother 56  . Cancer - Colon Maternal Grandfather     mets to bone  . Stomach cancer Paternal Grandmother   . Cervical cancer Sister     dx between 53-33  . Prostate cancer Maternal Uncle 59    mets to bone  . Ovarian cancer Paternal Aunt   . Heart attack Paternal Grandfather   . Skin cancer Paternal Aunt     Social History Social History  Substance Use Topics  . Smoking status: Current Every Day Smoker    Packs/day: 0.50    Years: 33.00    Types: Cigarettes  . Smokeless tobacco: Never Used     Comment: form given 02-04-16   . Alcohol use No     Allergies   Codeine   Review of Systems Review of Systems 10 Systems reviewed and are negative for acute change except as noted in the HPI.   Physical Exam Updated Vital Signs BP (!) 158/103 (BP Location: Right Arm)   Pulse 74   Temp 98.6 F (37 C) (Oral)   Resp 16   Ht 5' (1.524 m)   Wt 130 lb (59 kg)   LMP 06/14/2012   SpO2 96%   BMI 25.39 kg/m   Physical Exam  Constitutional: She  is oriented to person, place, and time. She appears well-developed and well-nourished. No distress.  HENT:  Head: Normocephalic and atraumatic.  Nose: Nose normal.  Mouth/Throat: Oropharynx is clear and moist.  Eyes: Conjunctivae and EOM are normal. Pupils are equal, round, and reactive to light.  Neck: Neck supple.  Cardiovascular: Normal rate, regular rhythm, normal heart sounds and intact distal pulses.   No murmur heard. Pulmonary/Chest: Effort normal and breath sounds normal. No respiratory distress.  Abdominal: Soft. She exhibits no distension. There is no tenderness. There is no guarding.  Musculoskeletal: Normal range of motion. She exhibits no edema or tenderness.  Neurological: She is alert and oriented to person, place, and time. No cranial nerve deficit. She exhibits normal muscle tone. Coordination normal.  Skin: Skin is warm and dry.  Psychiatric: She has a normal mood and affect.  Nursing note and vitals reviewed.    ED Treatments / Results  Labs (all labs ordered are listed, but only abnormal results are displayed) Labs Reviewed - No data to display  EKG  EKG Interpretation None       Radiology No results found.  Procedures Procedures (including critical care time)  Medications Ordered in ED Medications - No data to display   Initial Impression / Assessment and Plan / ED Course  I have reviewed the triage vital signs and the nursing notes.  Pertinent labs & imaging results that were available during my care of the patient were reviewed by me and considered in my medical decision making (see chart for details).      Final Clinical Impressions(s) / ED Diagnoses   Final diagnoses:  Benzodiazepine withdrawal without complication [facility])  Patient is alert and appropriate. She shows no signs of mental confusion or acute medical illness. Her coordination is intact. [facility] database reviewed. He had been getting prescribed monthly clonazepam and zolpidem as  reported. Her last prescription was last month. There were no other additional providers. Reports she has taken this medication for 3 years but now  wishes to discontinue it. She reports however getting symptoms that seem to be escalating rather than diminishing. She reports she would like to get treatment so that she can discontinue this and not be on a similar medication. Patient otherwise appears to be in good health. Resources are provided as per her request. I have given her clonazepam to taper over a 2 week period as she establishes care and a treatment program.  New Prescriptions New Prescriptions   CLONAZEPAM (KLONOPIN) 0.5 MG TABLET    Take 1 tablet (0.5 mg total) by mouth 2 (two) times daily as needed for anxiety. Take 0.5 two to three times daily for 3 days, then 0.5 no more than twice daily for 3 days, then 0.5 daily for 3 days, then 0.25 daily for 3 days.     Charlesetta Shanks, MD 11/16/16 [phone]

## 2016-11-16 NOTE — ED Triage Notes (Signed)
Pt sts her pmd has recently gone out of practice and she is out of her Klonopin x2 weeks.  Is having multiple sx, including abdominal pain, loss of appetite, dry mouth, insomnia, can't focus, etc.

## 2016-12-10 ENCOUNTER — Other Ambulatory Visit: Payer: Self-pay | Admitting: Gastroenterology

## 2016-12-10 DIAGNOSIS — R197 Diarrhea, unspecified: Secondary | ICD-10-CM

## 2016-12-10 DIAGNOSIS — R1084 Generalized abdominal pain: Secondary | ICD-10-CM

## 2016-12-10 DIAGNOSIS — R634 Abnormal weight loss: Secondary | ICD-10-CM

## 2016-12-17 IMAGING — US US PELVIS LIMITED
1 series · 14 of 25 positions shown · non-contrast
Comparison: Abdominal CT 02/10/2016

CLINICAL DATA: Pustules, left gluteal, for 2 weeks.

EXAM:
LIMITED ULTRASOUND OF PELVIS
TECHNIQUE: Limited transabdominal ultrasound examination of the pelvis was
performed.

[Series 1: us pelvis limited · 0.06mm/px · 14 of 56 slices shown]
[im 1/56]
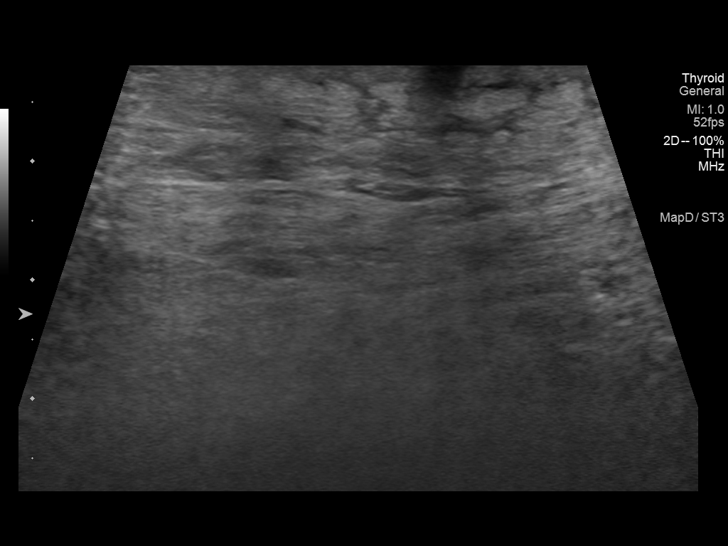
[im 5/56]
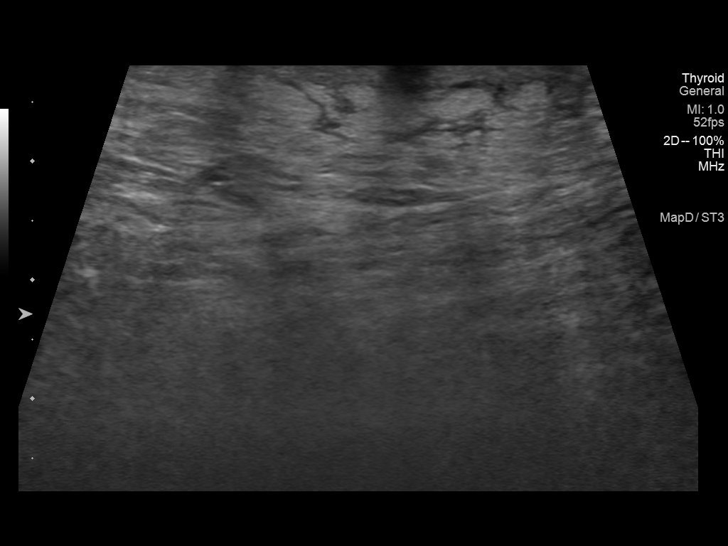
[im 10/56]
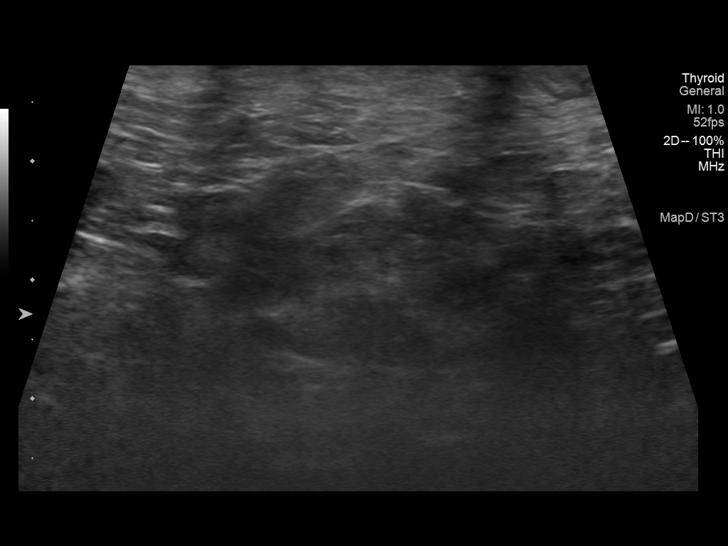
[im 14/56]
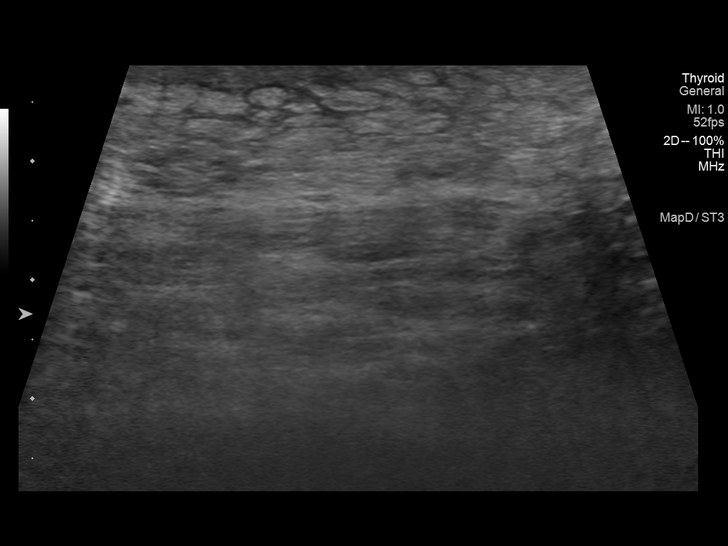
[im 19/56]
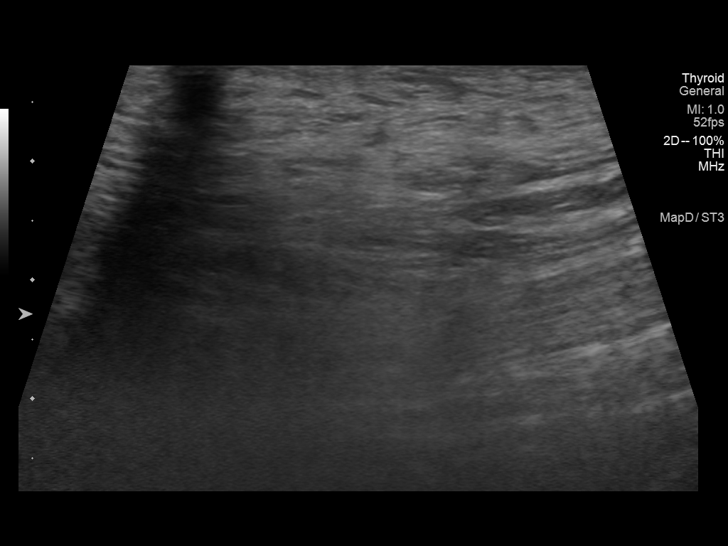
[im 21/56]
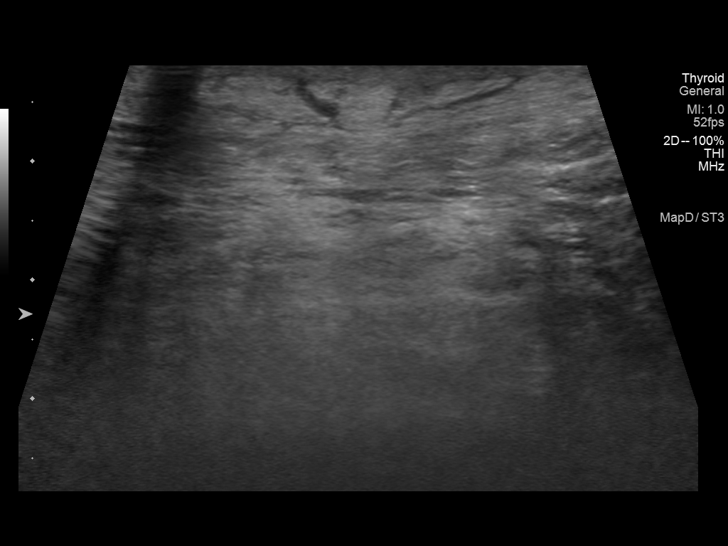
[im 26/56]
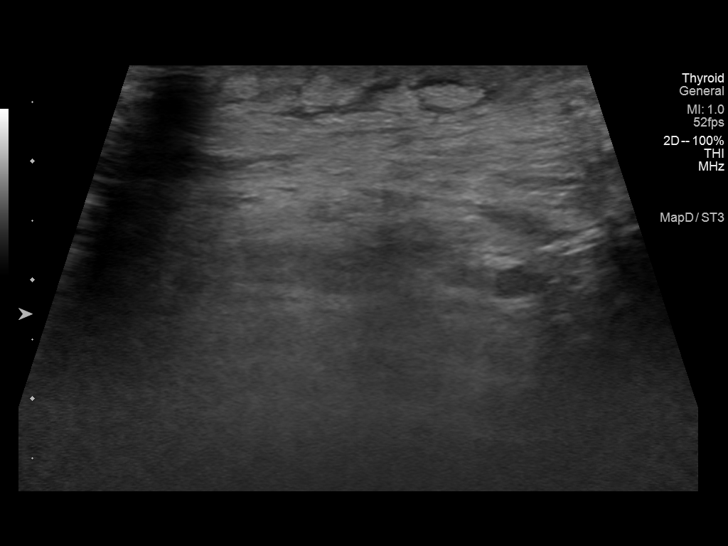
[im 30/56]
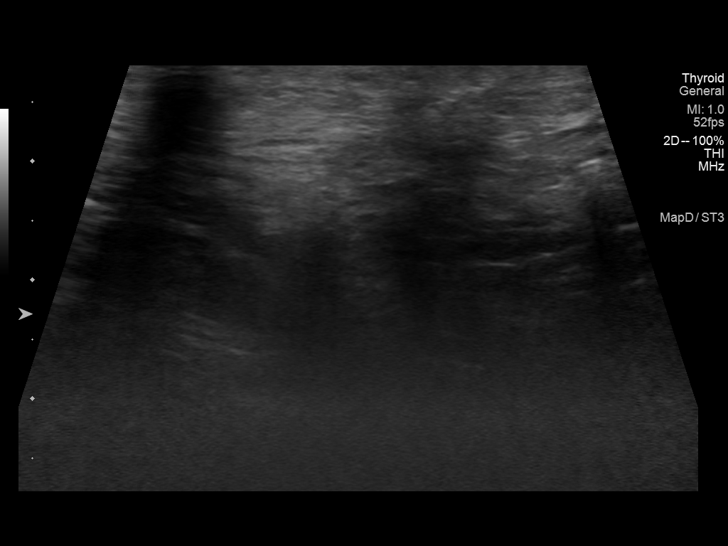
[im 35/56]
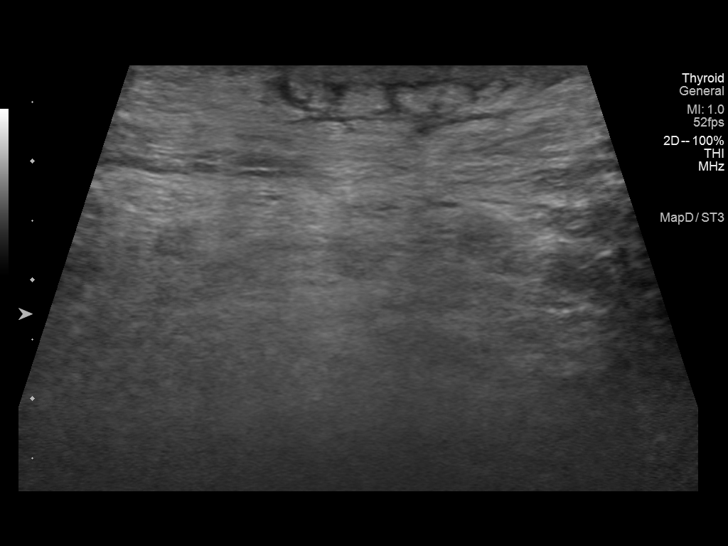
[im 37/56]
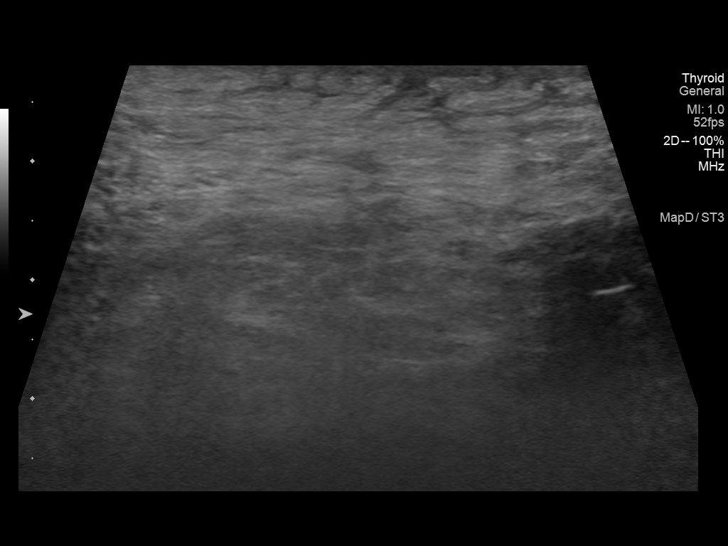
[im 42/56]
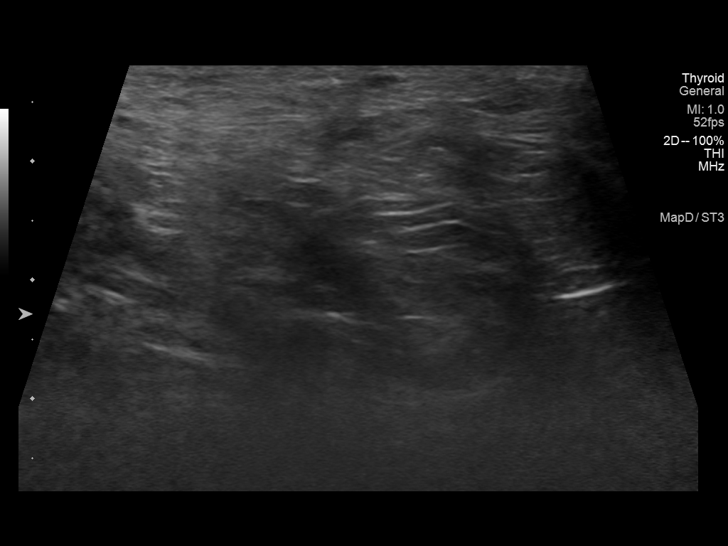
[im 46/56]
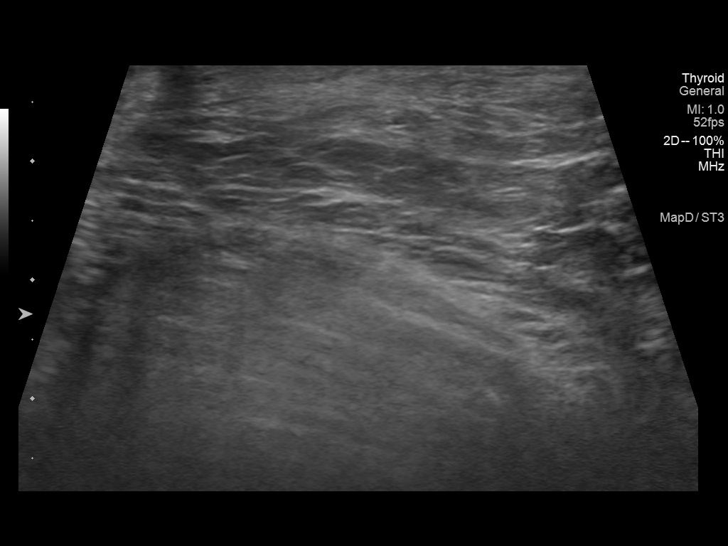
[im 51/56]
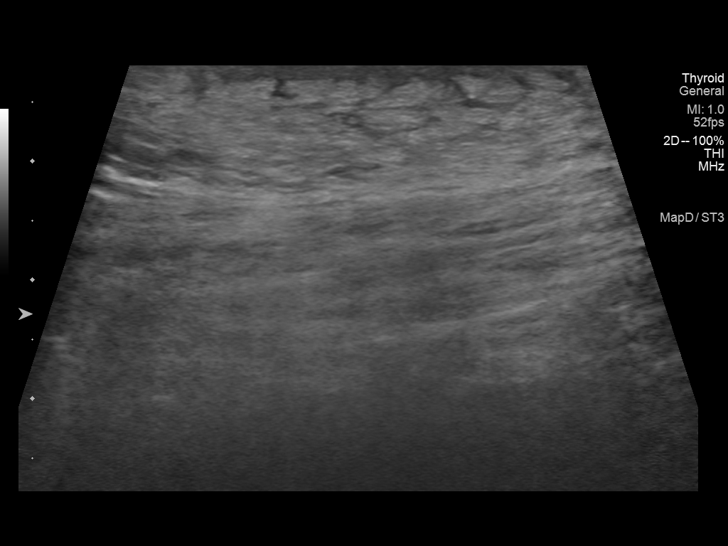
[im 56/56]
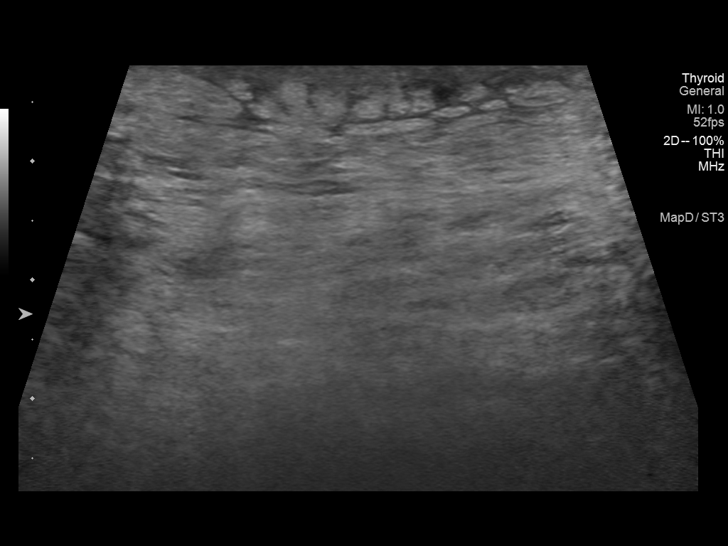

[14 of 25 positions shown; findings below may reference images not displayed]

FINDINGS: Bands of edema in the superficial subcutaneous fat. The skin appears
diffusely thickened. Negative for abscess.
IMPRESSION: Left gluteal cellulitis.  Negative for abscess.

## 2017-01-25 ENCOUNTER — Encounter (HOSPITAL_COMMUNITY): Payer: Self-pay | Admitting: Emergency Medicine

## 2017-01-25 ENCOUNTER — Emergency Department (HOSPITAL_COMMUNITY)
Admission: EM | Admit: 2017-01-25 | Discharge: 2017-01-26 | Disposition: A | Discharge status: Home / Self Care | Payer: Medicare Other | Attending: Emergency Medicine | Admitting: Emergency Medicine

## 2017-01-25 DIAGNOSIS — F17210 Nicotine dependence, cigarettes, uncomplicated: Secondary | ICD-10-CM | POA: Insufficient documentation

## 2017-01-25 DIAGNOSIS — G4489 Other headache syndrome: Secondary | ICD-10-CM | POA: Insufficient documentation

## 2017-01-25 DIAGNOSIS — Z85820 Personal history of malignant melanoma of skin: Secondary | ICD-10-CM | POA: Insufficient documentation

## 2017-01-25 DIAGNOSIS — R1013 Epigastric pain: Secondary | ICD-10-CM | POA: Diagnosis not present

## 2017-01-25 DIAGNOSIS — K6289 Other specified diseases of anus and rectum: Secondary | ICD-10-CM

## 2017-01-25 DIAGNOSIS — Z79899 Other long term (current) drug therapy: Secondary | ICD-10-CM | POA: Diagnosis not present

## 2017-01-25 DIAGNOSIS — R51 Headache: Secondary | ICD-10-CM | POA: Diagnosis present

## 2017-01-25 LAB — COMPREHENSIVE METABOLIC PANEL
ALT: 10 U/L — ABNORMAL LOW (ref 14–54)
AST: 13 U/L — ABNORMAL LOW (ref 15–41)
Albumin: 4.2 g/dL (ref 3.5–5.0)
Alkaline Phosphatase: 49 U/L (ref 38–126)
Anion gap: 7 (ref 5–15)
BUN: 7 mg/dL (ref 6–20)
CO2: 29 mmol/L (ref 22–32)
Calcium: 9.2 mg/dL (ref 8.9–10.3)
Chloride: 104 mmol/L (ref 101–111)
Creatinine, Ser: 0.93 mg/dL (ref 0.44–1.00)
GFR calc Af Amer: >60 mL/min (ref >60)
GFR calc non Af Amer: >60 mL/min (ref >60)
Glucose, Bld: 108 mg/dL — ABNORMAL HIGH (ref 65–99)
Potassium: 3.9 mmol/L (ref 3.5–5.1)
Sodium: 140 mmol/L (ref 135–145)
Total Bilirubin: 0.7 mg/dL (ref 0.3–1.2)
Total Protein: 7.5 g/dL (ref 6.5–8.1)

## 2017-01-25 LAB — URINALYSIS, ROUTINE W REFLEX MICROSCOPIC
Bilirubin Urine: NEGATIVE
Glucose, UA: NEGATIVE mg/dL
Hgb urine dipstick: NEGATIVE
Ketones, ur: NEGATIVE mg/dL
Nitrite: NEGATIVE
Protein, ur: NEGATIVE mg/dL
Specific Gravity, Urine: 1.013 (ref 1.005–1.030)
pH: 5 (ref 5.0–8.0)

## 2017-01-25 LAB — CBC WITH DIFFERENTIAL/PLATELET
Basophils Absolute: 0 10*3/uL (ref 0.0–0.1)
Basophils Relative: 0 %
Eosinophils Absolute: 0.1 10*3/uL (ref 0.0–0.7)
Eosinophils Relative: 1 %
HCT: 45.7 % (ref 36.0–46.0)
Hemoglobin: 15 g/dL (ref 12.0–15.0)
Lymphocytes Relative: 22 %
Lymphs Abs: 1.8 10*3/uL (ref 0.7–4.0)
MCH: 32.3 pg (ref 26.0–34.0)
MCHC: 32.8 g/dL (ref 30.0–36.0)
MCV: 98.5 fL (ref 78.0–100.0)
Monocytes Absolute: 0.5 10*3/uL (ref 0.1–1.0)
Monocytes Relative: 6 %
Neutro Abs: 5.7 10*3/uL (ref 1.7–7.7)
Neutrophils Relative %: 71 %
Platelets: 318 10*3/uL (ref 150–400)
RBC: 4.64 MIL/uL (ref 3.87–5.11)
RDW: 12.3 % (ref 11.5–15.5)
WBC: 8.1 10*3/uL (ref 4.0–10.5)

## 2017-01-25 LAB — I-STAT CG4 LACTIC ACID, ED
Lactic Acid, Venous: 0.92 mmol/L (ref 0.5–1.9)
Lactic Acid, Venous: 0.75 mmol/L (ref 0.5–1.9)

## 2017-01-25 NOTE — ED Triage Notes (Addendum)
Patient reports cluster of approximately 4 wounds to bottom, with intermittent headache, and chest pains at night x2 weeks. Denies fevers and chest pains at this time. Hx MRSA. States pain mimics previous MRSA pain. Ambulatory to triage.

## 2017-01-25 NOTE — ED Provider Notes (Signed)
[facility] DEPT Provider Note   CSN: [id number] Arrival date & time: 01/25/17  1803  By signing my name below, I, [name], attest that this documentation has been prepared under the direction and in the presence of Charlann Lange, PA-C. Electronically Signed: Lise Auer, ED Scribe. 01/26/17. 12:11 AM.  History   Chief Complaint Chief Complaint  Patient presents with  . Recurrent Skin Infections  . Headache   The history is provided by the patient. No language interpreter was used.    HPI Comments: [name] is a 46 y.o. female wiyh a PMHx of GERD, IBS, and MRSA, who presents to the Emergency Department with multiple complaints. Pt complaining of sudden onset, brief in duration, sharp, pressure-like intermittent right temporal headaches for a week and a half. She also notes upper centralized chest pain, unintentional weight loss, and an area of irration on her right buttock. She reports over the past week and a half she has been experiencing intermittent right temporal headaches. She notes her headaches are worsened with bending, and she reports associated vision disturbance (blurriness and black dots) and nausea. Pt notes mild relief when applying to the area. She denies a hx of headaches. Currently in no pain. She also notes upper centralized dull chest pain that is worsened while lying down at night. Pain is alleviated with standing. Reports a hx of GERD but is unsure is the symptoms are related. Currently on Pantoprazole and Protonix for GERD. Pt notes unintentionally weight loss of 60 lbs over the last year. Lastly, pt notes an area of pain to her bottom that feels like the MRSA she had in the past.  Past Medical History:  Diagnosis Date  . Anxiety   . Arthritis    left knee  . Astigmatism    both eyes  . Cancer ([place])    melanoma removed from back   . Cataract    left eye  . Depression   . Diverticulosis   . Erosive esophagitis   . Family history of ovarian  cancer   . Family history of prostate cancer   . Family history of uterine cancer   . 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 GERD (gastroesophageal reflux disease)   . H/O urinary infection   . Headache   . History of hiatal hernia   . IBS (irritable bowel syndrome)    no current med.  . Jaundice   . MRSA (methicillin resistant Staphylococcus aureus)    hospitalized for 48 hours in Dec 2016  . Staph infection    "in my blood"    Patient Active Problem List   Diagnosis Date Noted  . Genetic testing 08/24/2016  . Family history of ovarian cancer   . Family history of uterine cancer   . Family history of prostate cancer   . Dysphagia 06/23/2016  . Cellulitis 03/02/2016  . Loss of weight 02/04/2016  . Diarrhea of presumed infectious origin 02/04/2016  . Generalized abdominal pain 02/04/2016  . Internal hemorrhoid, bleeding 04/24/2014  . Irregular menstrual cycle 06/22/2012  . TOBACCO ABUSE 10/19/2009  . SYNCOPE 10/19/2009  . CHEST PAIN 10/19/2009  . SYNCOPE, HX OF 10/19/2009  . CANDIDIASIS OF THE ESOPHAGUS 06/17/2009  . LEG PAIN, LEFT 12/22/2008  . HAIR LOSS 06/03/2008  . HEMORRHOIDS, INTERNAL, WITH BLEEDING 11/01/2007  . VITAMIN B12 DEFICIENCY 09/05/2007  . DYSPLASTIC NEVUS 08/14/2007  . THROMBOCYTOSIS 07/03/2007  . Diarrhea 07/03/2007  . MELANOMA, TRUNK, HX OF  07/03/2007  . HYPERCHOLESTEROLEMIA 05/31/2007  . DISORDER, BIPOLAR NEC 05/31/2007  . PSORIASIS 05/31/2007  . INSOMNIA 05/31/2007  . COCAINE ABUSE 05/14/2007  . PERIODONTAL DISEASE 05/14/2007  . Gastritis and gastroduodenitis 05/14/2007  . DIVERTICULOSIS, COLON, HX OF 05/14/2007  . MYALGIA, HX OF 05/14/2007    Past Surgical History:  Procedure Laterality Date  . CHOLECYSTECTOMY  08/02/2012   Procedure: LAPAROSCOPIC CHOLECYSTECTOMY WITH INTRAOPERATIVE CHOLANGIOGRAM;  Surgeon: Rolm Bookbinder, MD;  Location: [facility];  Service: General;  Laterality: N/A;  . COLONOSCOPY WITH  PROPOFOL  04/09/2014  . COLONOSCOPY WITH PROPOFOL N/A 07/14/2016   Procedure: COLONOSCOPY WITH PROPOFOL;  Surgeon: Milus Banister, MD;  [facility];  Service: Endoscopy;  Laterality: N/A;  . ESOPHAGOGASTRODUODENOSCOPY (EGD) WITH PROPOFOL  04/09/2014  . ESOPHAGOGASTRODUODENOSCOPY (EGD) WITH PROPOFOL N/A 07/14/2016   Procedure: ESOPHAGOGASTRODUODENOSCOPY (EGD) WITH PROPOFOL;  Surgeon: Milus Banister, MD;  [facility];  Service: Endoscopy;  Laterality: N/A;  . HYSTEROSCOPY W/D&C  06/22/2012   Procedure: DILATATION AND CURETTAGE /HYSTEROSCOPY;  Surgeon: Lahoma Crocker, MD;  Location: [facility];  Service: Gynecology;  Laterality: N/A;  . KNEE ARTHROSCOPY  2011   left  . ORIF TOE FRACTURE Left 11/05/2014   Procedure: OPEN REDUCTION INTERNAL FIXATION (ORIF) LEFT FIFTH METATARSAL (TOE) FRACTURE WITH CALCANEAL BONE GRAFT;  Surgeon: Dorna Leitz, MD;  Location: [facility];  Service: Orthopedics;  Laterality: Left;  . WISDOM TOOTH EXTRACTION      OB History    No data available       Home Medications    Prior to Admission medications   Medication Sig Start Date End Date Taking? Authorizing Provider  Biotin 1000 MCG tablet Take 1,000 mcg by mouth 2 (two) times daily.    [provider]  cholestyramine Lucrezia Starch) 4 g packet  08/31/16   [provider]  clobetasol ointment (TEMOVATE) 0.93 % Apply 1 application topically 2 (two) times daily. 07/19/16   Kandis Cocking A, CNM  clonazePAM (KLONOPIN) 0.5 MG tablet Take 1 tablet (0.5 mg total) by mouth 2 (two) times daily as needed for anxiety. Take 0.5 two to three times daily for 3 days, then 0.5 no more than twice daily for 3 days, then 0.5 daily for 3 days, then 0.25 daily for 3 days. 11/16/16   Charlesetta Shanks, MD  clonazePAM (KLONOPIN) 1 MG tablet Take 1 mg by mouth 3 (three) times daily as needed for anxiety.  09/14/15   [provider]  dexlansoprazole (DEXILANT) 60 MG capsule Take 60 mg  by mouth daily.    [provider]  dicyclomine (BENTYL) 10 MG capsule TAKE 2 CAPSULES BY MOUTH 3 TIMES A DAY 12/12/16   Milus Banister, MD  diphenhydrAMINE (BENADRYL) 25 mg capsule Take 25 mg by mouth at bedtime as needed for itching (rash).     [provider]  doxepin (SINEQUAN) 10 MG capsule Take 10 mg by mouth at bedtime.    [provider]  loperamide (IMODIUM) 2 MG capsule Take 2 mg by mouth as needed for diarrhea or loose stools.    [provider]  pantoprazole (PROTONIX) 40 MG tablet Take 1 tablet (40 mg total) by mouth daily. 06/22/16   Zehr, Laban Emperor, PA-C  promethazine (PHENERGAN) 25 MG tablet Take 1 tablet (25 mg total) by mouth every 6 (six) hours as needed for nausea or vomiting. 09/22/15   Tamala Julian, [place], CNM  saccharomyces boulardii (FLORASTOR) 250 MG capsule Take 1 capsule (250 mg total) by mouth  2 (two) times daily. Patient not taking: Reported on 09/19/2016 06/22/16   Zehr, Laban Emperor, PA-C  Simethicone (GAS-X PO) Take 1 tablet by mouth 2 (two) times daily as needed (gas).    [provider]  traZODone (DESYREL) 50 MG tablet Take 50 mg by mouth at bedtime.    [provider]  triamcinolone ointment (KENALOG) 0.1 % Apply 1 application topically 4 (four) times daily. 06/14/16   Denney, Rachelle A, CNM  zolpidem (AMBIEN) 10 MG tablet Take 10 mg by mouth at bedtime. 09/14/15   [provider]   Family History Family History  Problem Relation Age of Onset  . Heart disease Father   . Stroke Father   . Heart attack Father   . Skin cancer Father   . Uterine cancer Mother 45  . Cancer - Colon Maternal Grandfather        mets to bone  . Stomach cancer Paternal Grandmother   . Cervical cancer Sister        dx between 67-33  . Prostate cancer Maternal Uncle 59       mets to bone  . Ovarian cancer Paternal Aunt   . Heart attack Paternal Grandfather   . Skin cancer Paternal Aunt     Social History Social History    Substance Use Topics  . Smoking status: Current Every Day Smoker    Packs/day: 0.50    Years: 33.00    Types: Cigarettes  . Smokeless tobacco: Never Used     Comment: form given 02-04-16   . Alcohol use No   Allergies   Codeine  Review of Systems Review of Systems  Constitutional: Positive for unexpected weight change.  Eyes: Positive for visual disturbance.  Cardiovascular: Positive for chest pain.  Gastrointestinal: Positive for nausea.  Skin: Positive for rash.       Right buttock.   Neurological: Positive for headaches.    Physical Exam Updated Vital Signs BP 127/85 (BP Location: Right Arm)   Pulse 74   Temp 98.5 F (36.9 C) (Oral)   Resp 20   LMP 06/14/2012   SpO2 98%   Physical Exam  Constitutional: She is oriented to person, place, and time. She appears well-developed and well-nourished. No distress.  Well appearing. No acute distress.   HENT:  Head: Normocephalic and atraumatic.  No temporal bruit.   Neck: Normal range of motion. Neck supple.  Cardiovascular: Normal rate and regular rhythm.   Pulmonary/Chest: Effort normal and breath sounds normal.  Abdominal: Soft. Bowel sounds are normal. There is no tenderness. There is no rebound and no guarding.  Genitourinary:  Genitourinary Comments: No rectal or perirectal abscess present. There is a perianal rash consisting of hyperpigmentation, scaling, mild redness and tenderness.   Musculoskeletal: Normal range of motion.  Neurological: She is alert and oriented to person, place, and time. No cranial nerve deficit.  Skin: Skin is warm and dry. No rash noted.  Psychiatric: She has a normal mood and affect.  Nursing note and vitals reviewed.  ED Treatments / Results  DIAGNOSTIC STUDIES: Oxygen Saturation is 98% on RA, normal by my interpretation.   COORDINATION OF CARE: 11:44 PM-Discussed next steps with pt. Pt verbalized understanding and is agreeable with the plan.   Labs (all labs ordered are listed,  but only abnormal results are displayed) Labs Reviewed  COMPREHENSIVE METABOLIC PANEL - Abnormal; Notable for the following:       Result Value   Glucose, Bld 108 (*)  AST 13 (*)    ALT 10 (*)    All other components within normal limits  CBC WITH DIFFERENTIAL/PLATELET  URINALYSIS, ROUTINE W REFLEX MICROSCOPIC  I-STAT CG4 LACTIC ACID, ED  I-STAT CG4 LACTIC ACID, ED    EKG  EKG Interpretation None      Radiology No results found.  Procedures Procedures (including critical care time)  Medications Ordered in ED Medications - No data to display  Initial Impression / Assessment and Plan / ED Course  I have reviewed the triage vital signs and the nursing notes.  Pertinent labs & imaging results that were available during my care of the patient were reviewed by me and considered in my medical decision making (see chart for details).     Patient with multiple complaints: headache, rash, chest pain. She is well appearing. Exam is benign with exception of rash around rectum. Do not suspect abscess in this area. VSS, labs unremarkable. No neuro deficits.   She can be discharged home with outpatient follow up with PCP.  Final Clinical Impressions(s) / ED Diagnoses   Final diagnoses:  None   1. Dyspepsia 2. Rectal pain 3. Nonspecific headache  New Prescriptions New Prescriptions   No medications on file  I personally performed the services described in this documentation, which was scribed in my presence. The recorded information has been reviewed and is accurate.      Charlann Lange, PA-C 02/05/17 1221    Virgel Manifold, MD 02/06/17 [phone]

## 2017-01-26 DIAGNOSIS — G4489 Other headache syndrome: Secondary | ICD-10-CM | POA: Diagnosis not present

## 2017-01-26 LAB — SEDIMENTATION RATE: SED RATE: 1 mm/h (ref 0–22)

## 2017-01-26 MED ORDER — PROMETHAZINE HCL 25 MG RE SUPP: 25.0000 mg | Freq: Four times a day (QID) | RECTAL | 0 refills | Status: DC | PRN

## 2017-01-26 MED ORDER — SUCRALFATE 1 GM/10ML PO SUSP: 1.0000 g | Freq: Three times a day (TID) | ORAL | 0 refills | Status: DC

## 2017-01-26 MED ORDER — HYDROCODONE-ACETAMINOPHEN 5-325 MG PO TABS: 1.0000 | ORAL_TABLET | ORAL | 0 refills | Status: DC | PRN

## 2017-01-26 MED ORDER — DOXYCYCLINE HYCLATE 100 MG PO TABS
100.0000 mg | ORAL_TABLET | Freq: Two times a day (BID) | ORAL | Status: DC
  Administered 2017-01-26: 100 mg via ORAL
  Filled 2017-01-26: qty 1

## 2017-01-26 MED ORDER — HYDROCODONE-ACETAMINOPHEN 5-325 MG PO TABS
1.0000 | ORAL_TABLET | Freq: Once | ORAL | Status: AC
  Administered 2017-01-26: 1 via ORAL
  Filled 2017-01-26: qty 1

## 2017-01-26 MED ORDER — CLOTRIMAZOLE 1 % EX CREA: TOPICAL_CREAM | CUTANEOUS | 0 refills | Status: DC

## 2017-01-26 MED ORDER — PROMETHAZINE HCL 25 MG PO TABS
25.0000 mg | ORAL_TABLET | Freq: Once | ORAL | Status: AC
  Administered 2017-01-26: 25 mg via ORAL
  Filled 2017-01-26: qty 1

## 2017-01-26 MED ORDER — DOXYCYCLINE HYCLATE 100 MG PO CAPS: 100.0000 mg | ORAL_CAPSULE | Freq: Two times a day (BID) | ORAL | 0 refills | Status: DC

## 2017-01-26 NOTE — ED Notes (Signed)
Did EKG, but unable to print because the printer is not working. 

## 2017-01-26 NOTE — Discharge Instructions (Signed)
Take Doxycycline for MRSA coverage. Also use the clotrimazole cream as directed. No cause for the headache is found but you have a normal neurologic exam. Follow up with primary care for re-evaluation. Take Norco as needed for pain and carafate for burning chest pain. Continue Protonix. Return here with any worsening symptoms or new concern.

## 2017-03-03 ENCOUNTER — Other Ambulatory Visit: Payer: Self-pay | Admitting: Gastroenterology

## 2017-03-10 ENCOUNTER — Other Ambulatory Visit: Payer: Self-pay | Admitting: Gastroenterology

## 2017-03-10 DIAGNOSIS — R634 Abnormal weight loss: Secondary | ICD-10-CM

## 2017-03-10 DIAGNOSIS — R197 Diarrhea, unspecified: Secondary | ICD-10-CM

## 2017-03-10 DIAGNOSIS — R1084 Generalized abdominal pain: Secondary | ICD-10-CM

## 2017-03-21 ENCOUNTER — Encounter: Payer: Self-pay | Admitting: Neurology

## 2017-03-21 ENCOUNTER — Ambulatory Visit (INDEPENDENT_AMBULATORY_CARE_PROVIDER_SITE_OTHER): Payer: Medicare Other | Admitting: Neurology

## 2017-03-21 VITALS — BP 120/83 | HR 88 | Ht 61.0 in | Wt 149.0 lb

## 2017-03-21 DIAGNOSIS — R2689 Other abnormalities of gait and mobility: Secondary | ICD-10-CM

## 2017-03-21 DIAGNOSIS — R51 Headache with orthostatic component, not elsewhere classified: Secondary | ICD-10-CM

## 2017-03-21 DIAGNOSIS — R519 Headache, unspecified: Secondary | ICD-10-CM

## 2017-03-21 DIAGNOSIS — R202 Paresthesia of skin: Secondary | ICD-10-CM

## 2017-03-21 DIAGNOSIS — R200 Anesthesia of skin: Secondary | ICD-10-CM | POA: Diagnosis not present

## 2017-03-21 DIAGNOSIS — R0683 Snoring: Secondary | ICD-10-CM

## 2017-03-21 DIAGNOSIS — R251 Tremor, unspecified: Secondary | ICD-10-CM

## 2017-03-21 DIAGNOSIS — R0681 Apnea, not elsewhere classified: Secondary | ICD-10-CM | POA: Diagnosis not present

## 2017-03-21 DIAGNOSIS — R5382 Chronic fatigue, unspecified: Secondary | ICD-10-CM | POA: Diagnosis not present

## 2017-03-21 DIAGNOSIS — H5713 Ocular pain, bilateral: Secondary | ICD-10-CM

## 2017-03-21 DIAGNOSIS — H539 Unspecified visual disturbance: Secondary | ICD-10-CM | POA: Diagnosis not present

## 2017-03-21 DIAGNOSIS — R42 Dizziness and giddiness: Secondary | ICD-10-CM

## 2017-03-21 NOTE — Progress Notes (Signed)
[facility]    Provider:  Dr Jaynee Eagles Referring Provider: Lindie Spruce, MD Primary Care Physician:  Precious Gilding, [place]  CC:  Numbness and tingling and multiple other neurologic complaints  HPI:  [name] is a 46 y.o. female here as a referral from Dr. Dannielle Karvonen for numbness and tingling however she has multiple neurologic complaints. Past medical history of IBS, GERD erosive esophagitis, migraine, depression, anxiety. Started getting headaches 2 months ago or longer, never had headaches before. She has pressure and sharp, behind the right eye, worse with eye movements and looking to the periphery. Meloxicam does not help. Sunlight bother her as well. Happens 3-4x a day and last less than 5 minute. Nothing makes it better, can be severe, she has to pull over from driving. Feels like brain freeze and brain freeze behind the eye, intense, nausea, vision changes. She has numbness and tingling in her fingers. Fingers look "pruny" and goes numb like they are asleep, happens and triggers with use it is random. Daily headaches. She also feels off balance. Sister has severe migraines. This is in the setting of a lot of stress. She is not sleeping well and she is snoring a lot, some days she wake up with headaches. Husband told her she stops breathingin the middle of night, she holds her breathe. She is really tired during the day and falls asleep often. No other focal neurologic deficits, associated symptoms, inciting events or modifiable factors.  Reviewed notes, labs and imaging from outside physicians, which showed:   Reviewed labs CMP unremarkable with BUN 9 and creatinine 1.1 02/08/2017, magnesium 2.2 which is normal, CBC with elevated hemoglobin 15.1 and otherwise unremarkable, TSH 0.517 which is slightly reduced, free T4 however normal.  Review of Systems: Patient complains of symptoms per HPI as well as the following symptoms: Weight loss, fatigue, blurred vision, double  vision, memory loss, confusion, headache, numbness, weakness, insomnia, snoring, dizziness, tremor, depression, anxiety, not no sleep, racing thoughts, trouble swallowing, diarrhea, joint pain, joint swelling. Pertinent negatives and positives per HPI. All others negative.   Social History   Social History  . Marital status: Significant Other    Spouse name: N/A  . Number of children: 0  . Years of education: College   Occupational History  . Disability    Social History Main Topics  . Smoking status: Current Every Day Smoker    Packs/day: 1.00    Years: 33.00    Types: Cigarettes  . Smokeless tobacco: Never Used     Comment: form given 02-04-16   . Alcohol use No  . Drug use: No     Comment: history of use  . Sexual activity: Yes    Birth control/ protection: Post-menopausal   Other Topics Concern  . Not on file   Social History Narrative   Lives at home w/ significant other   Right-handed   Caffeine: coffee "all day"    Family History  Problem Relation Age of Onset  . Heart disease Father   . Stroke Father   . Heart attack Father   . Skin cancer Father   . Diabetes Father   . Uterine cancer Mother 76  . COPD Mother   . Cancer - Colon Maternal Grandfather        mets to bone  . Stomach cancer Paternal Grandmother   . Cervical cancer Sister        dx between 39-33  . Migraines Sister   . Prostate cancer Maternal  Uncle 59       mets to bone  . Ovarian cancer Paternal Aunt   . Heart attack Paternal Grandfather   . Skin cancer Paternal Aunt     Past Medical History:  Diagnosis Date  . Anxiety   . Arthritis    left knee  . Astigmatism    both eyes  . Cancer ([place])    melanoma removed from back   . Cataract    left eye  . Depression   . Diverticulosis   . Erosive esophagitis   . Family history of ovarian cancer   . Family history of prostate cancer   . Family history of uterine cancer   . 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 GERD (gastroesophageal reflux disease)   . H/O urinary infection   . Headache   . History of hiatal hernia   . IBS (irritable bowel syndrome)    no current med.  . Jaundice   . MRSA (methicillin resistant Staphylococcus aureus)    hospitalized for 48 hours in Dec 2016  . Staph infection    "in my blood"    Past Surgical History:  Procedure Laterality Date  . CHOLECYSTECTOMY  08/02/2012   Procedure: LAPAROSCOPIC CHOLECYSTECTOMY WITH INTRAOPERATIVE CHOLANGIOGRAM;  Surgeon: Rolm Bookbinder, MD;  Location: [place];  Service: General;  Laterality: N/A;  . COLONOSCOPY WITH PROPOFOL  04/09/2014  . COLONOSCOPY WITH PROPOFOL N/A 07/14/2016   Procedure: COLONOSCOPY WITH PROPOFOL;  Surgeon: Milus Banister, MD;  Location: [facility];  Service: Endoscopy;  Laterality: N/A;  . DILATION AND CURETTAGE OF UTERUS     w/ hysteroscopy to remove cyst  . ESOPHAGOGASTRODUODENOSCOPY (EGD) WITH PROPOFOL  04/09/2014  . ESOPHAGOGASTRODUODENOSCOPY (EGD) WITH PROPOFOL N/A 07/14/2016   Procedure: ESOPHAGOGASTRODUODENOSCOPY (EGD) WITH PROPOFOL;  Surgeon: Milus Banister, MD;  Location: [facility];  Service: Endoscopy;  Laterality: N/A;  . HYSTEROSCOPY W/D&C  06/22/2012   Procedure: DILATATION AND CURETTAGE /HYSTEROSCOPY;  Surgeon: Lahoma Crocker, MD;  Location: [facility];  Service: Gynecology;  Laterality: N/A;  . KNEE ARTHROSCOPY  2011   left  . ORIF TOE FRACTURE Left 11/05/2014   Procedure: OPEN REDUCTION INTERNAL FIXATION (ORIF) LEFT FIFTH METATARSAL (TOE) FRACTURE WITH CALCANEAL BONE GRAFT;  Surgeon: Dorna Leitz, MD;  Location: [place];  Service: Orthopedics;  Laterality: Left;  . WISDOM TOOTH EXTRACTION      Current Outpatient Prescriptions  Medication Sig Dispense Refill  . cholestyramine (QUESTRAN) 4 g packet TAKE 1 PACKET BY MOUTH 2 TIMES DAILY 60 packet 5  . citalopram (CELEXA) 20 MG tablet Take 20 mg by mouth at bedtime.  2  . clobetasol ointment (TEMOVATE) 7.68 %  Apply 1 application topically 2 (two) times daily. 30 g 0  . clonazePAM (KLONOPIN) 0.5 MG tablet Take 1 mg by mouth daily.     . clotrimazole (LOTRIMIN) 1 % cream Apply to affected area 2 times daily 15 g 0  . dicyclomine (BENTYL) 10 MG capsule TAKE 2 CAPSULES BY MOUTH 3 TIMES A DAY 540 capsule 0  . diphenhydrAMINE (BENADRYL) 25 mg capsule Take 25 mg by mouth at bedtime as needed for sleep.     [name] DM-Doxylamine-Acetaminophen (NYQUIL COLD & FLU PO) Take 1 capsule by mouth at bedtime as needed (sleep).    [name] doxepin (SINEQUAN) 10 MG capsule Take 100 mg by mouth at bedtime.     [name] loperamide (IMODIUM) 2 MG capsule Take 2 mg by mouth  as needed for diarrhea or loose stools.    . meclizine (ANTIVERT) 25 MG tablet Take 25 mg by mouth daily as needed for dizziness.    . meloxicam (MOBIC) 15 MG tablet TAKE 1 TABLET BY MOUTH AT ONSET OF MIGRAINE  2  . pantoprazole (PROTONIX) 40 MG tablet Take 1 tablet (40 mg total) by mouth daily. 90 tablet 3  . promethazine (PHENERGAN) 25 MG suppository Place 1 suppository (25 mg total) rectally every 6 (six) hours as needed for nausea or vomiting. 12 each 0  . sucralfate (CARAFATE) 1 GM/10ML suspension Take 10 mLs (1 g total) by mouth 4 (four) times daily -  with meals and at bedtime. 420 mL 0  . triamcinolone ointment (KENALOG) 0.1 % Apply 1 application topically 4 (four) times daily. 80 g PRN  . zolpidem (AMBIEN) 10 MG tablet Take 10 mg by mouth at bedtime.     Current Facility-Administered Medications  Medication Dose Route Frequency Provider Last Rate Last Dose  . 0.9 %  sodium chloride infusion  500 mL Intravenous Continuous Milus Banister, MD        Allergies as of 03/21/2017 - Review Complete 03/21/2017  Allergen Reaction Noted  . Codeine Nausea And Vomiting 05/14/2007    Vitals: BP 120/83   Pulse 88   Ht 5\' 1"  (1.549 m)   Wt 149 lb (67.6 kg)   LMP 06/14/2012   BMI 28.15 kg/m  Last Weight:  Wt Readings from Last 1 Encounters:  03/21/17 149 lb (67.6  kg)   Last Height:   Ht Readings from Last 1 Encounters:  03/21/17 5\' 1"  (1.549 m)   Physical exam: Exam: Gen: NAD, conversant, well nourised, well groomed                     CV: RRR, no MRG. No Carotid Bruits. No peripheral edema, warm, nontender Eyes: Conjunctivae clear without exudates or hemorrhage  Neuro: Detailed Neurologic Exam  Speech:    Speech is normal; fluent and spontaneous with normal comprehension.  Cognition:    The patient is oriented to person, place, and time;     recent and remote memory intact;     language fluent;     normal attention, concentration,     fund of knowledge Cranial Nerves:    The pupils are equal, round, and reactive to light. The fundi are normal and spontaneous venous pulsations are present. Visual fields are full to finger confrontation. Extraocular movements are intact. Trigeminal sensation is intact and the muscles of mastication are normal. The face is symmetric. The palate elevates in the midline. Hearing intact. Voice is normal. Shoulder shrug is normal. The tongue has normal motion without fasciculations.   Coordination:    Normal finger to nose and heel to shin. Normal rapid alternating movements.   Gait:    Heel-toe and tandem gait are normal.   Motor Observation:    No asymmetry, no atrophy, and no involuntary movements noted. Tone:    Normal muscle tone.    Posture:    Posture is normal. normal erect    Strength:    Strength is V/V in the upper and lower limbs.      Sensation: intact to LT     Reflex Exam:  DTR's:    Deep tendon reflexes in the upper and lower extremities are normal bilaterally.   Toes:    The toes are downgoing bilaterally.   Clonus:    Clonus is absent.  Assessment/Plan:  46 year old female with paresthesias in the arms, new onset intractable headaches, imbalance, vision changes, positional headache.  Sleep evaluation:  Reports snoring, she wake up with headaches. Husband told her  she stops breathing in the middle of night, she holds her breathe. She is really tired during the day and falls asleep often.  Given new onset headaches that are position with vision changes, recommend MRI brain w/wo contrast Emg/ncs bilateral UE  Discussed: To prevent or relieve headaches, try the following: Cool Compress. Lie down and place a cool compress on your head.  Avoid headache triggers. If certain foods or odors seem to have triggered your migraines in the past, avoid them. A headache diary might help you identify triggers.  Include physical activity in your daily routine. Try a daily walk or other moderate aerobic exercise.  Manage stress. Find healthy ways to cope with the stressors, such as delegating tasks on your to-do list.  Practice relaxation techniques. Try deep breathing, yoga, massage and visualization.  Eat regularly. Eating regularly scheduled meals and maintaining a healthy diet might help prevent headaches. Also, drink plenty of fluids.  Follow a regular sleep schedule. Sleep deprivation might contribute to headaches Consider biofeedback. With this mind-body technique, you learn to control certain bodily functions - such as muscle tension, heart rate and blood pressure - to prevent headaches or reduce headache pain.    Proceed to emergency room if you experience new or worsening symptoms or symptoms do not resolve, if you have new neurologic symptoms or if headache is severe, or for any concerning symptom.   Provided education and documentation from American headache Society toolbox including articles on: chronic migraine medication overuse headache, chronic migraines, prevention of migraines, behavioral and other nonpharmacologic treatments for headache.  Orders Placed This Encounter  Procedures  . MR BRAIN W WO CONTRAST  . Ambulatory referral to Sleep Studies  . NCV with EMG(electromyography)    Cc: Michelle Spruce, MD  Sarina Ill, MD  [facility] [address] [place], [place] [address]  Phone [phone] Fax [phone]

## 2017-03-21 NOTE — Patient Instructions (Signed)
Remember to drink plenty of fluid, eat healthy meals and do not skip any meals. Try to eat protein with a every meal and eat a healthy snack such as fruit or nuts in between meals. Try to keep a regular sleep-wake schedule and try to exercise daily, particularly in the form of walking, 20-30 minutes a day, if you can.   As far as diagnostic testing: MRI brain, Sleep evaluation and EMG/NCS  I would like to see you back for emg/ncs, sooner if we need to. Please call us with any interim questions, concerns, problems, updates or refill requests.   Our phone number is [phone]. We also have an after hours call service for urgent matters and there is a physician on-call for urgent questions. For any emergencies you know to call 911 or go to the nearest emergency room

## 2017-03-27 ENCOUNTER — Institutional Professional Consult (permissible substitution): Payer: Medicare Other | Admitting: Neurology

## 2017-03-28 ENCOUNTER — Encounter: Payer: Self-pay | Admitting: Neurology

## 2017-04-05 ENCOUNTER — Ambulatory Visit
Admission: RE | Admit: 2017-04-05 | Discharge: 2017-04-05 | Disposition: A | Discharge status: Home / Self Care | Payer: Medicare Other | Source: Ambulatory Visit | Attending: Neurology | Admitting: Neurology

## 2017-04-05 DIAGNOSIS — R202 Paresthesia of skin: Secondary | ICD-10-CM

## 2017-04-05 DIAGNOSIS — H5713 Ocular pain, bilateral: Secondary | ICD-10-CM

## 2017-04-05 DIAGNOSIS — R5382 Chronic fatigue, unspecified: Secondary | ICD-10-CM

## 2017-04-05 DIAGNOSIS — R251 Tremor, unspecified: Secondary | ICD-10-CM

## 2017-04-05 DIAGNOSIS — R42 Dizziness and giddiness: Secondary | ICD-10-CM

## 2017-04-05 DIAGNOSIS — R200 Anesthesia of skin: Secondary | ICD-10-CM

## 2017-04-05 DIAGNOSIS — R0683 Snoring: Secondary | ICD-10-CM

## 2017-04-05 DIAGNOSIS — R51 Headache with orthostatic component, not elsewhere classified: Secondary | ICD-10-CM

## 2017-04-05 DIAGNOSIS — R519 Headache, unspecified: Secondary | ICD-10-CM

## 2017-04-05 DIAGNOSIS — H539 Unspecified visual disturbance: Secondary | ICD-10-CM

## 2017-04-05 DIAGNOSIS — R0681 Apnea, not elsewhere classified: Secondary | ICD-10-CM

## 2017-04-05 DIAGNOSIS — R2689 Other abnormalities of gait and mobility: Secondary | ICD-10-CM

## 2017-04-05 MED ORDER — GADOBENATE DIMEGLUMINE 529 MG/ML IV SOLN
14.0000 mL | Freq: Once | INTRAVENOUS | Status: AC | PRN
  Administered 2017-04-05: 14 mL via INTRAVENOUS

## 2017-04-06 ENCOUNTER — Encounter: Payer: Self-pay | Admitting: Neurology

## 2017-04-06 ENCOUNTER — Ambulatory Visit (INDEPENDENT_AMBULATORY_CARE_PROVIDER_SITE_OTHER): Payer: Medicare Other | Admitting: Neurology

## 2017-04-06 VITALS — BP 118/79 | HR 93 | Ht 61.0 in | Wt 146.0 lb

## 2017-04-06 DIAGNOSIS — Z72820 Sleep deprivation: Secondary | ICD-10-CM | POA: Diagnosis not present

## 2017-04-06 DIAGNOSIS — F17200 Nicotine dependence, unspecified, uncomplicated: Secondary | ICD-10-CM

## 2017-04-06 DIAGNOSIS — R0683 Snoring: Secondary | ICD-10-CM | POA: Diagnosis not present

## 2017-04-06 DIAGNOSIS — G4730 Sleep apnea, unspecified: Secondary | ICD-10-CM

## 2017-04-06 DIAGNOSIS — F513 Sleepwalking [somnambulism]: Secondary | ICD-10-CM

## 2017-04-06 DIAGNOSIS — Z72821 Inadequate sleep hygiene: Secondary | ICD-10-CM | POA: Diagnosis not present

## 2017-04-06 NOTE — Progress Notes (Signed)
SLEEP MEDICINE CLINIC   Provider:  Larey Seat, M D  Primary Care Physician:  Precious Gilding, [place]   Referring Provider:  Dr Jaynee Eagles   Chief Complaint  Patient presents with  . New Patient (Initial Visit)    referred by Jaynee Eagles, Pt alone, husband states that she has had apneic spells and snoring. tired thru out the day and excessive daytime sleepiness in the day.    HPI:  [name] is a 46 y.o. female , seen here as in a referral/ revisit  from Dr. Jaynee Eagles for a sleep consultation.   Chief complaint according to patient : " I snore like a bear "   [name] is a established patient with Dr. Jaynee Eagles, was a resident originally seen for numbness and tingling. She has daily headaches, she has also a sister that suffers from severe migraines and one of her main triggers his stress and not sleeping well. She feels that her sleep does not have the sound quality it once had, she was told that she is snoring a lot and loudly and she often wakes with a headache in the morning.  Sleep habits are as follows: She goes to bed by about 9 PM, she can't fall asleep usually until after midnight and she watches TV in bed. She shares the bed with her husband and her dog. Once asleep, she has trouble to stay asleep and wakes every 2 hours. She does not need to go to the bathroom at night and when she wakes up she does not feel refreshed or restored. She often feels actually worse than when she went to bed. She does not recall her dreams, but she felt peanuts in her bed and must have been sleepwalking and eating! She is also very restless and her husband has complained about her constant moving, thrashing about. She may wake up between 4 and 4 AM feels wide awake but not rested. For years now she has only gotten between 4 and 5 hours of nocturnal sleep- if that. She usually does not nap during the day but she struggles to stay awake when she drives longer distances. She feels drowsy when she is not  physically active or mentally stimulated.  Sleep medical history and family sleep history: sleep walking - sleep deprived. Had a significant history of coronary artery disease, in her father's family. Multiple heart attacks, bypass surgeries etc. and died of a massive stroke at age 65.  Social history:  Married, no children. 1 dog. Caffeine : Dr Malachi Bonds and coffee and up to 8 a day. She takes it to bed!!! Tobacco user, 0.5 ppd. 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    Review of Systems: Out of a complete 14 system review, the patient complains of only the following symptoms, and all other reviewed systems are negative.   Epworth score 16 , Fatigue severity score 42  , depression score n/a    Social History   Social History  . Marital status: Significant Other    Spouse name: N/A  . Number of children: 0  . Years of education: College   Occupational History  . Disability    Social History Main Topics  . Smoking status: Current Every Day Smoker    Packs/day: 1.00    Years: 33.00    Types: Cigarettes  . Smokeless tobacco:  Never Used     Comment: form given 02-04-16   . Alcohol use No  . Drug use: No     Comment: history of use  . Sexual activity: Yes    Birth control/ protection: Post-menopausal   Other Topics Concern  . Not on file   Social History Narrative   Lives at home w/ significant other   Right-handed   Caffeine: coffee "all day"    Family History  Problem Relation Age of Onset  . Heart disease Father   . Stroke Father   . Heart attack Father   . Skin cancer Father   . Diabetes Father   . Uterine cancer Mother 21  . COPD Mother   . Cancer - Colon Maternal Grandfather        mets to bone  . Stomach cancer Paternal Grandmother   . Cervical cancer Sister        dx between 58-33  . Migraines Sister   . Prostate cancer Maternal Uncle 59        mets to bone  . Ovarian cancer Paternal Aunt   . Heart attack Paternal Grandfather   . Skin cancer Paternal Aunt     Past Medical History:  Diagnosis Date  . Anxiety   . Arthritis    left knee  . Astigmatism    both eyes  . Cancer ([place])    melanoma removed from back   . Cataract    left eye  . Depression   . Diverticulosis   . Erosive esophagitis   . Family history of ovarian cancer   . Family history of prostate cancer   . Family history of uterine cancer   . 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 GERD (gastroesophageal reflux disease)   . H/O urinary infection   . Headache   . History of hiatal hernia   . IBS (irritable bowel syndrome)    no current med.  . Jaundice   . MRSA (methicillin resistant Staphylococcus aureus)    hospitalized for 48 hours in Dec 2016  . Staph infection    "in my blood"    Past Surgical History:  Procedure Laterality Date  . CHOLECYSTECTOMY  08/02/2012   Procedure: LAPAROSCOPIC CHOLECYSTECTOMY WITH INTRAOPERATIVE CHOLANGIOGRAM;  Surgeon: Rolm Bookbinder, MD;  Location: [facility];  Service: General;  Laterality: N/A;  . COLONOSCOPY WITH PROPOFOL  04/09/2014  . COLONOSCOPY WITH PROPOFOL N/A 07/14/2016   Procedure: COLONOSCOPY WITH PROPOFOL;  Surgeon: Milus Banister, MD;  Location: [facility];  Service: Endoscopy;  Laterality: N/A;  . DILATION AND CURETTAGE OF UTERUS     w/ hysteroscopy to remove cyst  . ESOPHAGOGASTRODUODENOSCOPY (EGD) WITH PROPOFOL  04/09/2014  . ESOPHAGOGASTRODUODENOSCOPY (EGD) WITH PROPOFOL N/A 07/14/2016   Procedure: ESOPHAGOGASTRODUODENOSCOPY (EGD) WITH PROPOFOL;  Surgeon: Milus Banister, MD;  Location: [facility];  Service: Endoscopy;  Laterality: N/A;  . HYSTEROSCOPY W/D&C  06/22/2012   Procedure: DILATATION AND CURETTAGE /HYSTEROSCOPY;  Surgeon: Lahoma Crocker, MD;  Location: [facility];  Service: Gynecology;  Laterality: N/A;  . KNEE ARTHROSCOPY  2011   left  . ORIF TOE  FRACTURE Left 11/05/2014   Procedure: OPEN REDUCTION INTERNAL FIXATION (ORIF) LEFT FIFTH METATARSAL (TOE) FRACTURE WITH CALCANEAL BONE GRAFT;  Surgeon: Dorna Leitz, MD;  Location: [facility];  Service: Orthopedics;  Laterality: Left;  . WISDOM TOOTH EXTRACTION      Current Outpatient Prescriptions  Medication Sig Dispense Refill  .  cholestyramine (QUESTRAN) 4 g packet TAKE 1 PACKET BY MOUTH 2 TIMES DAILY 60 packet 5  . citalopram (CELEXA) 20 MG tablet Take 20 mg by mouth at bedtime.  2  . clobetasol ointment (TEMOVATE) 3.41 % Apply 1 application topically 2 (two) times daily. 30 g 0  . clonazePAM (KLONOPIN) 0.5 MG tablet Take 1 mg by mouth daily.     . clotrimazole (LOTRIMIN) 1 % cream Apply to affected area 2 times daily 15 g 0  . dicyclomine (BENTYL) 10 MG capsule TAKE 2 CAPSULES BY MOUTH 3 TIMES A DAY 540 capsule 0  . diphenhydrAMINE (BENADRYL) 25 mg capsule Take 25 mg by mouth at bedtime as needed for sleep.     [name] DM-Doxylamine-Acetaminophen (NYQUIL COLD & FLU PO) Take 1 capsule by mouth at bedtime as needed (sleep).    [name] doxepin (SINEQUAN) 10 MG capsule Take 100 mg by mouth at bedtime.     [name] loperamide (IMODIUM) 2 MG capsule Take 2 mg by mouth as needed for diarrhea or loose stools.    . meclizine (ANTIVERT) 25 MG tablet Take 25 mg by mouth daily as needed for dizziness.    . meloxicam (MOBIC) 15 MG tablet TAKE 1 TABLET BY MOUTH AT ONSET OF MIGRAINE  2  . pantoprazole (PROTONIX) 40 MG tablet Take 1 tablet (40 mg total) by mouth daily. 90 tablet 3  . promethazine (PHENERGAN) 25 MG suppository Place 1 suppository (25 mg total) rectally every 6 (six) hours as needed for nausea or vomiting. 12 each 0  . sucralfate (CARAFATE) 1 GM/10ML suspension Take 10 mLs (1 g total) by mouth 4 (four) times daily -  with meals and at bedtime. 420 mL 0  . triamcinolone ointment (KENALOG) 0.1 % Apply 1 application topically 4 (four) times daily. 80 g PRN  . zolpidem (AMBIEN) 10 MG tablet Take 10  mg by mouth at bedtime.     Current Facility-Administered Medications  Medication Dose Route Frequency Provider Last Rate Last Dose  . 0.9 %  sodium chloride infusion  500 mL Intravenous Continuous Milus Banister, MD        Allergies as of 04/06/2017 - Review Complete 04/06/2017  Allergen Reaction Noted  . Codeine Nausea And Vomiting 05/14/2007    Vitals: BP 118/79   Pulse 93   Ht 5\' 1"  (1.549 m)   Wt 146 lb (66.2 kg)   LMP 06/14/2012   BMI 27.59 kg/m  Last Weight:  Wt Readings from Last 1 Encounters:  04/06/17 146 lb (66.2 kg)   DQQ:IWLN mass index is 27.59 kg/m.     Last Height:   Ht Readings from Last 1 Encounters:  04/06/17 5\' 1"  (1.549 m)    Physical exam:  General: The patient is awake, alert and appears not in acute distress. The patient is well groomed. Head: Normocephalic, atraumatic. Neck is supple. Mallampati 2 neck circumference: 13. 5 . Nasal airflow patent , but congested in the morning . Retrognathia is seen.  Cardiovascular:  Regular rate and rhythm , without  murmurs or carotid bruit, and without distended neck veins. Respiratory: Lungs are clear to auscultation. Skin:  Without evidence of edema, or rash Trunk: BMI is 28. The patient's posture is erect  Neurologic exam : The patient is awake and alert, oriented to place and time.   Memory subjective described as intact.  Attention span & concentration ability appears normal.  Speech is fluent,  without dysarthria, dysphonia or aphasia.  Mood and affect are appropriate.  Cranial nerves: Pupils are equal and briskly reactive to light. Funduscopic exam deferred - Extraocular movements  in vertical and horizontal planes intact and without nystagmus. Visual fields by finger perimetry are intact. Hearing to finger rub intact.  Facial sensation intact to fine touch. Facial motor strength is symmetric and tongue and uvula move midline. Shoulder shrug was symmetrical.   Motor exam: Normal tone, muscle bulk  and symmetric strength in all extremities. Sensory:  Fine touch, pinprick and vibration were normal. Coordination:  Finger-to-nose maneuver  normal without evidence of ataxia, dysmetria or tremor. Gait and station: Patient walks without assistive device .  Deep tendon reflexes: in the  upper and lower extremities are symmetric and intact. Walls maneuver response is downgoing.    Assessment:  After physical and neurologic examination, review of laboratory studies,  Personal review of imaging studies, reports of other /same  Imaging studies, results of polysomnography and / or neurophysiology testing and pre-existing records as far as provided in visit., my assessment is   1) [name] is significantly sleep deprived, and this is due to working from home and into her late night hours. She needs to set the areas and boundaries to allow herself sleep time. There are also other issues of sleep hygiene, she should reserve the bed for sleep and sex, she should not watch TV in the bedroom, eat in the bedroom and drink caffeinated beverages at night. I'm especially intrigued by her sleepwalking and sleep eating episode. If she was a sleep walker in childhood certain stress which include sleep deprivation can bring her sleepwalking on again. Sleepwalking is much reduced when the patient is well rested in a stressfree environment, with a reduced number of external stimuli. She had a upbringing in a family with poor sleep habits and was given cola as an infant(!)  2) excessive daytime sleepiness is reflected in Epworth score of 16.   3) snoring loudly- will check for apnea.    The patient was advised of the nature of the diagnosed disorder , the treatment options and the  risks for general health and wellness arising from not treating the condition.   I spent more than 35  minutes of face to face time with the patient.  Greater than 50% of time was spent in counseling and coordination of care. We  have discussed the diagnosis and differential and I answered the patient's questions.    Plan:  Treatment plan and additional workup :  [name]. will have to implement some changes to her sleep habits and establish a better sleep hygiene. In order to test her for the presence of apnea I will ask her to come in for an attended sleep study. I will also ask her to reduce her tobacco use further, and eliminate caffeine in the afternoons.   Larey Seat, MD 0/10/90, 3:30 AM  Certified in Neurology by ABPN Certified in [place] by [facility] [address] [place], [place] [address]

## 2017-04-11 ENCOUNTER — Telehealth: Payer: Self-pay

## 2017-04-11 NOTE — Telephone Encounter (Signed)
-----   Message from Melvenia Beam, MD sent at 04/06/2017  7:08 PM EDT ----- MRiof the brain unremarkable. We can review the images at her emg/ncs at the end of the month thanks

## 2017-04-11 NOTE — Telephone Encounter (Signed)
Called pt w/ unremarkable MRI results. Verbalized understanding. Plans on keeping EMG/NCV appt as scheduled in a couple of weeks. Says that she is waiting on approval/scheduling of sleep study. Voiced appreciation for call.

## 2017-04-25 ENCOUNTER — Institutional Professional Consult (permissible substitution): Payer: Medicare Other | Admitting: Neurology

## 2017-04-26 ENCOUNTER — Ambulatory Visit (INDEPENDENT_AMBULATORY_CARE_PROVIDER_SITE_OTHER): Payer: Medicare Other | Admitting: Neurology

## 2017-04-26 DIAGNOSIS — Z5329 Procedure and treatment not carried out because of patient's decision for other reasons: Secondary | ICD-10-CM

## 2017-04-27 ENCOUNTER — Encounter: Payer: Self-pay | Admitting: Neurology

## 2017-04-28 NOTE — Progress Notes (Signed)
No show

## 2017-05-09 ENCOUNTER — Ambulatory Visit (INDEPENDENT_AMBULATORY_CARE_PROVIDER_SITE_OTHER): Payer: Medicare Other | Admitting: Neurology

## 2017-05-09 DIAGNOSIS — Z72821 Inadequate sleep hygiene: Secondary | ICD-10-CM

## 2017-05-09 DIAGNOSIS — Z72820 Sleep deprivation: Secondary | ICD-10-CM

## 2017-05-09 DIAGNOSIS — G4730 Sleep apnea, unspecified: Secondary | ICD-10-CM

## 2017-05-09 DIAGNOSIS — F17200 Nicotine dependence, unspecified, uncomplicated: Secondary | ICD-10-CM

## 2017-05-09 DIAGNOSIS — R0683 Snoring: Secondary | ICD-10-CM

## 2017-05-11 ENCOUNTER — Telehealth: Payer: Self-pay | Admitting: Neurology

## 2017-05-11 ENCOUNTER — Encounter: Payer: Medicare Other | Admitting: Neurology

## 2017-05-11 NOTE — Telephone Encounter (Signed)
Called and went over the sleep study results. I informed her that the sleep study was negative for sleep apnea. Pt verbalized understanding and I informed her that I sent a copy to her surgeon so that she could proceed forward with her surgery scheduled on monday

## 2017-05-11 NOTE — Procedures (Signed)
PATIENT'S NAME:  [name], [name] DOB:      Jan 17, 1971      MR#:    [id number]     DATE OF RECORDING: 05/09/2017 REFERRING M.D.:  Dr. Jaynee Eagles. Study Performed:   Baseline Polysomnogram HISTORY:   46 year old female patient with snoring, sleep walking, and being sleep deprived. Reporting EDS.  The patient endorsed the Epworth Sleepiness Scale at 16 points.  FSS 42.  The patient's weight 146 pounds with a height of 61 (inches), resulting in a BMI of 27.5 kg/m2. The patient's neck circumference measured 13.5 inches.  CURRENT MEDICATIONS: Questran, Celexa, Temovate, Klonopin, Lotrimin, Bentyl, Benadryl, Sinequan, Antivert, Mobic, Protonix, Phenergan, Kenalog, Ambien   PROCEDURE:  This is a multichannel digital polysomnogram utilizing the SomnoStar 11.2 system.  Electrodes and sensors were applied and monitored per AASM Specifications.   EEG, EOG, Chin and Limb EMG, were sampled at 200 Hz.  ECG, Snore and Nasal Pressure, Thermal Airflow, Respiratory Effort, CPAP Flow and Pressure, Oximetry was sampled at 50 Hz. Digital video and audio were recorded.      BASELINE STUDY : Lights Out was at 21:23 and Lights On at 05:04.  Total recording time (TRT) was 461 minutes, with a total sleep time (TST) of 365 minutes.   The patient's sleep latency was 99 minutes.  REM latency was 189.5 minutes.  The sleep efficiency was 79.2 %.     SLEEP ARCHITECTURE: WASO (Wake after sleep onset) was 5.5 minutes.  There were 12 minutes in Stage N1, 95 minutes Stage N2, 210 minutes Stage N3 and 48 minutes in Stage REM.  The percentage of Stage N1 was 3.3%, Stage N2 was 26.%, Stage N3 was 57.5% and Stage R (REM sleep) was 13.2%.   RESPIRATORY ANALYSIS:  There were a total of 3 respiratory events:  2 obstructive apneas, 0 central apneas and 0 mixed apneas with 1 hypopnea. The patient also had 0 respiratory event related arousals (RERAs).    The total APNEA/HYPOPNEA INDEX (AHI) was 0.5/hour and the total RESPIRATORY DISTURBANCE INDEX  was .5 /hour.  0 events occurred in REM sleep and 2 events in NREM. The REM AHI was 0 /hour, versus a non-REM AHI of 0.6. The patient spent 0 minutes of total sleep time in the supine position and 365 minutes in non-supine. The supine AHI was 0.0 versus a non-supine AHI of 0.5.  OXYGEN SATURATION & C02:  The Wake baseline 02 saturation was 98%, with the lowest being 89%. Time spent below 89% saturation equaled 0 minutes. No evidence of hypercapnia.    PERIODIC LIMB MOVEMENTS:  The patient had a total of 0 Periodic Limb Movements.  The arousals were noted as: 13 were spontaneous, 0 were associated with PLMs, and 0 were associated with respiratory events. Audio and video analysis did not show any abnormal or unusual movements, behaviors, phonations or vocalizations. Non nocturia.  Moderately loud snoring.  Patient slept on her right side.    IMPRESSION:    Primary Snoring, not Apnea. No Hypoxemia. Low perioperative risk.   RECOMMENDATIONS:  1. Positional therapy (as to avoid supine sleep) advised. 2. Avoid sedative-hypnotics, alcohol and tobacco, which may worsen snoring, sleep apnea (as applicable). 3. ENT examination as clinically indicated if primary snoring is of clinical concern. 4. A follow up appointment can be offered in the Sleep Clinic at [facility]. The referring provider will be notified of the results.      I certify that I have reviewed the entire raw data  recording prior to the issuance of this report in accordance with the Standards of Accreditation of the American Academy of Sleep Medicine (AASM)   Larey Seat, MD    05-11-2017  Diplomat, American Board of Psychiatry and Neurology  Diplomat, American Board of [place] Director, [employer] [facility] [employer]

## 2017-05-11 NOTE — Telephone Encounter (Signed)
-----   Message from Larey Seat, MD sent at 05/11/2017 11:10 AM EDT ----- Patient did not reach SPLIT night criteria. There was no apnea, only snoring. No hypoxemia. Please let her surgeon know. CD

## 2017-05-15 ENCOUNTER — Emergency Department (HOSPITAL_COMMUNITY)
Admission: EM | Admit: 2017-05-15 | Discharge: 2017-05-15 | Discharge status: Absent without leave | Payer: Medicare Other

## 2017-05-15 NOTE — ED Notes (Signed)
Called pt to assess vitals and no response.

## 2017-05-15 NOTE — ED Notes (Signed)
Pt did not answer when called to be triaged x 2

## 2017-05-23 ENCOUNTER — Telehealth: Payer: Self-pay | Admitting: Neurology

## 2017-05-23 NOTE — Telephone Encounter (Signed)
Called patient to verify apt at 11:15 for Sept 27th. Dr Jaynee Eagles wanted [place] to call patient and make sure she was coming to the apt.LVM for patient to call us back and verify this

## 2017-05-24 ENCOUNTER — Encounter: Payer: Medicare Other | Admitting: Neurology

## 2017-05-25 ENCOUNTER — Encounter: Payer: Medicare Other | Admitting: Neurology

## 2017-05-26 ENCOUNTER — Encounter: Payer: Self-pay | Admitting: Neurology

## 2017-06-06 ENCOUNTER — Other Ambulatory Visit: Payer: Self-pay | Admitting: Gastroenterology

## 2017-06-06 DIAGNOSIS — R1084 Generalized abdominal pain: Secondary | ICD-10-CM

## 2017-06-06 DIAGNOSIS — R634 Abnormal weight loss: Secondary | ICD-10-CM

## 2017-06-06 DIAGNOSIS — R197 Diarrhea, unspecified: Secondary | ICD-10-CM

## 2017-06-15 ENCOUNTER — Encounter: Payer: Medicare Other | Admitting: Neurology

## 2017-07-04 ENCOUNTER — Other Ambulatory Visit: Payer: Self-pay

## 2017-07-04 DIAGNOSIS — R634 Abnormal weight loss: Secondary | ICD-10-CM

## 2017-07-04 DIAGNOSIS — R1084 Generalized abdominal pain: Secondary | ICD-10-CM

## 2017-07-04 DIAGNOSIS — R197 Diarrhea, unspecified: Secondary | ICD-10-CM

## 2017-07-04 MED ORDER — DICYCLOMINE HCL 10 MG PO CAPS: 20.0000 mg | ORAL_CAPSULE | Freq: Three times a day (TID) | ORAL | 3 refills | Status: DC

## 2017-07-12 ENCOUNTER — Other Ambulatory Visit: Payer: Self-pay

## 2017-07-12 MED ORDER — CHOLESTYRAMINE 4 G PO PACK: PACK | ORAL | 3 refills | Status: DC

## 2017-08-15 ENCOUNTER — Other Ambulatory Visit: Payer: Self-pay | Admitting: Physician Assistant

## 2017-08-15 DIAGNOSIS — Z1231 Encounter for screening mammogram for malignant neoplasm of breast: Secondary | ICD-10-CM

## 2017-09-26 ENCOUNTER — Encounter: Payer: Self-pay | Admitting: Neurology

## 2017-10-12 ENCOUNTER — Ambulatory Visit: Payer: Medicare Other

## 2017-10-26 ENCOUNTER — Ambulatory Visit: Payer: Medicare Other

## 2017-10-30 ENCOUNTER — Other Ambulatory Visit: Payer: Self-pay | Admitting: Physician Assistant

## 2017-10-30 DIAGNOSIS — E2839 Other primary ovarian failure: Secondary | ICD-10-CM

## 2017-11-17 ENCOUNTER — Ambulatory Visit: Payer: Medicare Other

## 2017-11-17 ENCOUNTER — Other Ambulatory Visit: Payer: Medicare Other

## 2017-12-02 ENCOUNTER — Ambulatory Visit (HOSPITAL_BASED_OUTPATIENT_CLINIC_OR_DEPARTMENT_OTHER)
Admission: EM | Admit: 2017-12-02 | Discharge: 2017-12-03 | Disposition: A | Discharge status: Home / Self Care | Payer: Medicare Other | Attending: Emergency Medicine | Admitting: Emergency Medicine

## 2017-12-02 ENCOUNTER — Encounter (HOSPITAL_BASED_OUTPATIENT_CLINIC_OR_DEPARTMENT_OTHER): Payer: Self-pay | Admitting: Emergency Medicine

## 2017-12-02 ENCOUNTER — Emergency Department (HOSPITAL_BASED_OUTPATIENT_CLINIC_OR_DEPARTMENT_OTHER): Payer: Medicare Other

## 2017-12-02 ENCOUNTER — Encounter (HOSPITAL_COMMUNITY)
Admission: EM | Disposition: A | Discharge status: Home / Self Care | Payer: Self-pay | Source: Home / Self Care | Attending: Emergency Medicine

## 2017-12-02 ENCOUNTER — Other Ambulatory Visit: Payer: Self-pay

## 2017-12-02 DIAGNOSIS — R0683 Snoring: Secondary | ICD-10-CM | POA: Diagnosis not present

## 2017-12-02 DIAGNOSIS — Z8041 Family history of malignant neoplasm of ovary: Secondary | ICD-10-CM | POA: Insufficient documentation

## 2017-12-02 DIAGNOSIS — Z85820 Personal history of malignant melanoma of skin: Secondary | ICD-10-CM | POA: Diagnosis not present

## 2017-12-02 DIAGNOSIS — X58XXXA Exposure to other specified factors, initial encounter: Secondary | ICD-10-CM | POA: Diagnosis not present

## 2017-12-02 DIAGNOSIS — E7800 Pure hypercholesterolemia, unspecified: Secondary | ICD-10-CM | POA: Diagnosis not present

## 2017-12-02 DIAGNOSIS — Z72820 Sleep deprivation: Secondary | ICD-10-CM | POA: Insufficient documentation

## 2017-12-02 DIAGNOSIS — Z800 Family history of malignant neoplasm of digestive organs: Secondary | ICD-10-CM | POA: Insufficient documentation

## 2017-12-02 DIAGNOSIS — F17210 Nicotine dependence, cigarettes, uncomplicated: Secondary | ICD-10-CM | POA: Insufficient documentation

## 2017-12-02 DIAGNOSIS — Z79899 Other long term (current) drug therapy: Secondary | ICD-10-CM | POA: Insufficient documentation

## 2017-12-02 DIAGNOSIS — Z885 Allergy status to narcotic agent status: Secondary | ICD-10-CM | POA: Insufficient documentation

## 2017-12-02 DIAGNOSIS — K648 Other hemorrhoids: Secondary | ICD-10-CM | POA: Diagnosis not present

## 2017-12-02 DIAGNOSIS — T18128A Food in esophagus causing other injury, initial encounter: Secondary | ICD-10-CM | POA: Insufficient documentation

## 2017-12-02 DIAGNOSIS — K219 Gastro-esophageal reflux disease without esophagitis: Secondary | ICD-10-CM | POA: Insufficient documentation

## 2017-12-02 DIAGNOSIS — E538 Deficiency of other specified B group vitamins: Secondary | ICD-10-CM | POA: Diagnosis not present

## 2017-12-02 DIAGNOSIS — F319 Bipolar disorder, unspecified: Secondary | ICD-10-CM | POA: Diagnosis not present

## 2017-12-02 DIAGNOSIS — H52203 Unspecified astigmatism, bilateral: Secondary | ICD-10-CM | POA: Insufficient documentation

## 2017-12-02 DIAGNOSIS — K449 Diaphragmatic hernia without obstruction or gangrene: Secondary | ICD-10-CM | POA: Diagnosis not present

## 2017-12-02 DIAGNOSIS — Z8042 Family history of malignant neoplasm of prostate: Secondary | ICD-10-CM | POA: Insufficient documentation

## 2017-12-02 DIAGNOSIS — N926 Irregular menstruation, unspecified: Secondary | ICD-10-CM | POA: Insufficient documentation

## 2017-12-02 DIAGNOSIS — F1410 Cocaine abuse, uncomplicated: Secondary | ICD-10-CM | POA: Insufficient documentation

## 2017-12-02 DIAGNOSIS — K222 Esophageal obstruction: Secondary | ICD-10-CM | POA: Insufficient documentation

## 2017-12-02 DIAGNOSIS — L409 Psoriasis, unspecified: Secondary | ICD-10-CM | POA: Diagnosis not present

## 2017-12-02 DIAGNOSIS — F419 Anxiety disorder, unspecified: Secondary | ICD-10-CM | POA: Diagnosis not present

## 2017-12-02 DIAGNOSIS — Z8614 Personal history of Methicillin resistant Staphylococcus aureus infection: Secondary | ICD-10-CM | POA: Diagnosis not present

## 2017-12-02 DIAGNOSIS — Z808 Family history of malignant neoplasm of other organs or systems: Secondary | ICD-10-CM | POA: Insufficient documentation

## 2017-12-02 DIAGNOSIS — Z8049 Family history of malignant neoplasm of other genital organs: Secondary | ICD-10-CM | POA: Insufficient documentation

## 2017-12-02 DIAGNOSIS — Z8249 Family history of ischemic heart disease and other diseases of the circulatory system: Secondary | ICD-10-CM | POA: Insufficient documentation

## 2017-12-02 DIAGNOSIS — K580 Irritable bowel syndrome with diarrhea: Secondary | ICD-10-CM | POA: Insufficient documentation

## 2017-12-02 DIAGNOSIS — G4730 Sleep apnea, unspecified: Secondary | ICD-10-CM | POA: Diagnosis not present

## 2017-12-02 DIAGNOSIS — Z823 Family history of stroke: Secondary | ICD-10-CM | POA: Insufficient documentation

## 2017-12-02 HISTORY — PX: ESOPHAGOGASTRODUODENOSCOPY: SHX5428

## 2017-12-02 LAB — CBC WITH DIFFERENTIAL/PLATELET
Basophils Absolute: 0 10*3/uL (ref 0.0–0.1)
Basophils Relative: 0 %
Eosinophils Absolute: 0.1 10*3/uL (ref 0.0–0.7)
Eosinophils Relative: 1 %
HCT: 41.4 % (ref 36.0–46.0)
Hemoglobin: 13.5 g/dL (ref 12.0–15.0)
Lymphocytes Relative: 7 %
Lymphs Abs: 0.9 10*3/uL (ref 0.7–4.0)
MCH: 32.5 pg (ref 26.0–34.0)
MCHC: 32.6 g/dL (ref 30.0–36.0)
MCV: 99.5 fL (ref 78.0–100.0)
Monocytes Absolute: 0.5 10*3/uL (ref 0.1–1.0)
Monocytes Relative: 4 %
Neutro Abs: 11.5 10*3/uL — ABNORMAL HIGH (ref 1.7–7.7)
Neutrophils Relative %: 88 %
Platelets: 385 10*3/uL (ref 150–400)
RBC: 4.16 MIL/uL (ref 3.87–5.11)
RDW: 13.6 % (ref 11.5–15.5)
WBC: 13.1 10*3/uL — ABNORMAL HIGH (ref 4.0–10.5)

## 2017-12-02 LAB — COMPREHENSIVE METABOLIC PANEL
ALT: 16 U/L (ref 14–54)
AST: 18 U/L (ref 15–41)
Albumin: 3.6 g/dL (ref 3.5–5.0)
Alkaline Phosphatase: 70 U/L (ref 38–126)
Anion gap: 11 (ref 5–15)
BILIRUBIN TOTAL: 0.4 mg/dL (ref 0.3–1.2)
BUN: 8 mg/dL (ref 6–20)
CO2: 24 mmol/L (ref 22–32)
CREATININE: 0.75 mg/dL (ref 0.44–1.00)
Calcium: 9.2 mg/dL (ref 8.9–10.3)
Chloride: 104 mmol/L (ref 101–111)
GFR calc Af Amer: >60 mL/min (ref >60)
GLUCOSE: 107 mg/dL — AB (ref 65–99)
Potassium: 4.1 mmol/L (ref 3.5–5.1)
Sodium: 139 mmol/L (ref 135–145)
TOTAL PROTEIN: 6.9 g/dL (ref 6.5–8.1)

## 2017-12-02 LAB — LIPASE, BLOOD: Lipase: 21 U/L (ref 11–51)

## 2017-12-02 SURGERY — EGD (ESOPHAGOGASTRODUODENOSCOPY)
Anesthesia: Moderate Sedation

## 2017-12-02 MED ORDER — DIPHENHYDRAMINE HCL 50 MG/ML IJ SOLN
INTRAMUSCULAR | Status: AC
  Filled 2017-12-02: qty 1

## 2017-12-02 MED ORDER — DIPHENHYDRAMINE HCL 50 MG/ML IJ SOLN
INTRAMUSCULAR | Status: DC | PRN
  Administered 2017-12-02: 25 mg via INTRAVENOUS

## 2017-12-02 MED ORDER — GLUCAGON HCL RDNA (DIAGNOSTIC) 1 MG IJ SOLR
1.0000 mg | Freq: Once | INTRAMUSCULAR | Status: AC
  Administered 2017-12-02: 1 mg via INTRAVENOUS
  Filled 2017-12-02: qty 1

## 2017-12-02 MED ORDER — FENTANYL CITRATE (PF) 100 MCG/2ML IJ SOLN
INTRAMUSCULAR | Status: DC | PRN
  Administered 2017-12-02 – 2017-12-03 (×4): 25 ug via INTRAVENOUS

## 2017-12-02 MED ORDER — PANTOPRAZOLE SODIUM 40 MG IV SOLR
40.0000 mg | Freq: Once | INTRAVENOUS | Status: AC
  Administered 2017-12-02: 40 mg via INTRAVENOUS
  Filled 2017-12-02: qty 40

## 2017-12-02 MED ORDER — BUTAMBEN-TETRACAINE-BENZOCAINE 2-2-14 % EX AERO
INHALATION_SPRAY | CUTANEOUS | Status: DC | PRN
  Administered 2017-12-02: 1 via TOPICAL

## 2017-12-02 MED ORDER — FENTANYL CITRATE (PF) 100 MCG/2ML IJ SOLN
INTRAMUSCULAR | Status: AC
  Filled 2017-12-02: qty 4

## 2017-12-02 MED ORDER — SODIUM CHLORIDE 0.9 % IV BOLUS (SEPSIS)
1000.0000 mL | Freq: Once | INTRAVENOUS | Status: AC
  Administered 2017-12-02: 1000 mL via INTRAVENOUS

## 2017-12-02 MED ORDER — SODIUM CHLORIDE 0.9 % IV SOLN
1000.0000 mL | INTRAVENOUS | Status: DC
Start: 2017-12-02 — End: 2017-12-03

## 2017-12-02 MED ORDER — MIDAZOLAM HCL 5 MG/ML IJ SOLN
INTRAMUSCULAR | Status: AC
  Filled 2017-12-02: qty 3

## 2017-12-02 MED ORDER — MIDAZOLAM HCL 10 MG/2ML IJ SOLN
INTRAMUSCULAR | Status: DC | PRN
  Administered 2017-12-02 – 2017-12-03 (×6): 2 mg via INTRAVENOUS

## 2017-12-02 NOTE — ED Provider Notes (Signed)
[facility] EMERGENCY DEPARTMENT Provider Note   CSN: [id number] Arrival date & time: 12/02/17  1814     History   Chief Complaint Chief Complaint  Patient presents with  . Foreign body in throat    HPI [name] is a 47 y.o. female.  HPI Patient reports that she has history of reflux.  She reports for the past 3 years, meat such as steak might get temporarily stuck in her esophagus but will go down on its own.  She reports last night at about 11 PM she ate some [place] broil and it got stuck.  She is tried multiple things to get at the past.  She reports anything she tries to drink or eat comes right back out again.  She reports is very uncomfortable in her lower chest.  Ports she was well before this happened.  Patient reports she has been seen at [facility] in the past. Past Medical History:  Diagnosis Date  . Anxiety   . Arthritis    left knee  . Astigmatism    both eyes  . Cancer ([place])    melanoma removed from back   . Cataract    left eye  . Depression   . Diverticulosis   . Erosive esophagitis   . Family history of ovarian cancer   . Family history of prostate cancer   . Family history of uterine cancer   . 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 GERD (gastroesophageal reflux disease)   . H/O urinary infection   . Headache   . History of hiatal hernia   . IBS (irritable bowel syndrome)    no current med.  . Jaundice   . MRSA (methicillin resistant Staphylococcus aureus)    hospitalized for 48 hours in Dec 2016  . Staph infection    "in my blood"    Patient Active Problem List   Diagnosis Date Noted  . Inadequate sleep hygiene 04/06/2017  . Snoring 04/06/2017  . Sleep apnea 04/06/2017  . Sleep deprivation 04/06/2017  . Genetic testing 08/24/2016  . Family history of ovarian cancer   . Family history of uterine cancer   . Family history of prostate cancer   . Dysphagia 06/23/2016  . Cellulitis  03/02/2016  . Loss of weight 02/04/2016  . Diarrhea of presumed infectious origin 02/04/2016  . Generalized abdominal pain 02/04/2016  . Internal hemorrhoid, bleeding 04/24/2014  . Irregular menstrual cycle 06/22/2012  . TOBACCO ABUSE 10/19/2009  . SYNCOPE 10/19/2009  . CHEST PAIN 10/19/2009  . SYNCOPE, HX OF 10/19/2009  . CANDIDIASIS OF THE ESOPHAGUS 06/17/2009  . LEG PAIN, LEFT 12/22/2008  . HAIR LOSS 06/03/2008  . HEMORRHOIDS, INTERNAL, WITH BLEEDING 11/01/2007  . VITAMIN B12 DEFICIENCY 09/05/2007  . DYSPLASTIC NEVUS 08/14/2007  . THROMBOCYTOSIS 07/03/2007  . Diarrhea 07/03/2007  . 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 HYPERCHOLESTEROLEMIA 05/31/2007  . DISORDER, BIPOLAR NEC 05/31/2007  . PSORIASIS 05/31/2007  . INSOMNIA 05/31/2007  . COCAINE ABUSE 05/14/2007  . PERIODONTAL DISEASE 05/14/2007  . Gastritis and gastroduodenitis 05/14/2007  . DIVERTICULOSIS, COLON, HX OF 05/14/2007  . MYALGIA, HX OF 05/14/2007    Past Surgical History:  Procedure Laterality Date  . CHOLECYSTECTOMY  08/02/2012   Procedure: LAPAROSCOPIC CHOLECYSTECTOMY WITH INTRAOPERATIVE CHOLANGIOGRAM;  Surgeon: Rolm Bookbinder, MD;  Location: [facility];  Service: General;  Laterality: N/A;  . COLONOSCOPY WITH PROPOFOL  04/09/2014  . COLONOSCOPY WITH PROPOFOL N/A 07/14/2016  Procedure: COLONOSCOPY WITH PROPOFOL;  Surgeon: Milus Banister, MD;  Location: [facility];  Service: Endoscopy;  Laterality: N/A;  . DILATION AND CURETTAGE OF UTERUS     w/ hysteroscopy to remove cyst  . ESOPHAGOGASTRODUODENOSCOPY (EGD) WITH PROPOFOL  04/09/2014  . ESOPHAGOGASTRODUODENOSCOPY (EGD) WITH PROPOFOL N/A 07/14/2016   Procedure: ESOPHAGOGASTRODUODENOSCOPY (EGD) WITH PROPOFOL;  Surgeon: Milus Banister, MD;  Location: [facility];  Service: Endoscopy;  Laterality: N/A;  . HYSTEROSCOPY W/D&C  06/22/2012   Procedure: DILATATION AND CURETTAGE /HYSTEROSCOPY;  Surgeon: Lahoma Crocker, MD;  Location: [facility];  Service: Gynecology;   Laterality: N/A;  . KNEE ARTHROSCOPY  2011   left  . ORIF TOE FRACTURE Left 11/05/2014   Procedure: OPEN REDUCTION INTERNAL FIXATION (ORIF) LEFT FIFTH METATARSAL (TOE) FRACTURE WITH CALCANEAL BONE GRAFT;  Surgeon: Dorna Leitz, MD;  Location: [facility];  Service: Orthopedics;  Laterality: Left;  . WISDOM TOOTH EXTRACTION       OB History   None      Home Medications    Prior to Admission medications   Medication Sig Start Date End Date Taking? Authorizing Provider  cholestyramine (QUESTRAN) 4 g packet TAKE 1 PACKET BY MOUTH 2 TIMES DAILY 07/12/17   Milus Banister, MD  citalopram (CELEXA) 20 MG tablet Take 20 mg by mouth at bedtime. 03/07/17   [provider]  clobetasol ointment (TEMOVATE) 1.32 % Apply 1 application topically 2 (two) times daily. 07/19/16   Kandis Cocking A, CNM  clonazePAM (KLONOPIN) 0.5 MG tablet Take 1 mg by mouth daily.     [provider]  clotrimazole (LOTRIMIN) 1 % cream Apply to affected area 2 times daily 01/26/17   Charlann Lange, PA-C  dicyclomine (BENTYL) 10 MG capsule Take 2 capsules (20 mg total) 3 (three) times daily by mouth. 07/04/17 10/02/17  Milus Banister, MD  diphenhydrAMINE (BENADRYL) 25 mg capsule Take 25 mg by mouth at bedtime as needed for sleep.     [provider]  DM-Doxylamine-Acetaminophen (NYQUIL COLD & FLU PO) Take 1 capsule by mouth at bedtime as needed (sleep).    [provider]  doxepin (SINEQUAN) 10 MG capsule Take 100 mg by mouth at bedtime.     [provider]  loperamide (IMODIUM) 2 MG capsule Take 2 mg by mouth as needed for diarrhea or loose stools.    [provider]  meclizine (ANTIVERT) 25 MG tablet Take 25 mg by mouth daily as needed for dizziness.    [provider]  meloxicam (MOBIC) 15 MG tablet TAKE 1 TABLET BY MOUTH AT ONSET OF MIGRAINE 03/09/17   [provider]  pantoprazole (PROTONIX) 40 MG tablet Take 1 tablet (40 mg total) by mouth  daily. 06/22/16   Zehr, Laban Emperor, PA-C  promethazine (PHENERGAN) 25 MG suppository Place 1 suppository (25 mg total) rectally every 6 (six) hours as needed for nausea or vomiting. 01/26/17   Charlann Lange, PA-C  sucralfate (CARAFATE) 1 GM/10ML suspension Take 10 mLs (1 g total) by mouth 4 (four) times daily -  with meals and at bedtime. 01/26/17   Charlann Lange, PA-C  triamcinolone ointment (KENALOG) 0.1 % Apply 1 application topically 4 (four) times daily. 06/14/16   Denney, Rachelle A, CNM  zolpidem (AMBIEN) 10 MG tablet Take 10 mg by mouth at bedtime. 09/14/15   [provider]    Family History Family History  Problem Relation Age of Onset  . Heart disease Father   . Stroke Father   .  Heart attack Father   . Skin cancer Father   . Diabetes Father   . Uterine cancer Mother 68  . COPD Mother   . Cancer - Colon Maternal Grandfather        mets to bone  . Stomach cancer Paternal Grandmother   . Cervical cancer Sister        dx between 24-33  . Migraines Sister   . Prostate cancer Maternal Uncle 59       mets to bone  . Ovarian cancer Paternal Aunt   . Heart attack Paternal Grandfather   . Skin cancer Paternal Aunt     Social History Social History   Tobacco Use  . Smoking status: Current Every Day Smoker    Packs/day: 1.00    Years: 33.00    Pack years: 33.00    Types: Cigarettes  . Smokeless tobacco: Never Used  . Tobacco comment: form given 02-04-16   Substance Use Topics  . Alcohol use: No    Alcohol/week: 0.0 oz  . Drug use: No    Types: Marijuana    Comment: history of use     Allergies   Codeine   Review of Systems Review of Systems 10 Systems reviewed and are negative for acute change except as noted in the HPI.   Physical Exam Updated Vital Signs BP 130/85 (BP Location: Right Arm)   Pulse 90   Temp 98.9 F (37.2 C) (Oral)   Resp 18   Ht 5\' 2"  (1.575 m)   Wt 79.4 kg (175 lb)   LMP 06/14/2012   SpO2 96%   BMI 32.01 kg/m   Physical  Exam  Constitutional: She is oriented to person, place, and time.  Patient is alert and nontoxic.  No respiratory distress.  She appears uncomfortable.  Mental status clear.  HENT:  Head: Normocephalic and atraumatic.  Posterior oropharynx widely patent.  No erythema or exudate.  Neck: Neck supple.  Cardiovascular: Normal rate, regular rhythm, normal heart sounds and intact distal pulses.  Pulmonary/Chest: Effort normal and breath sounds normal.  Abdominal: Soft. Bowel sounds are normal. She exhibits no distension. There is no tenderness. There is no guarding.  Patient intermittently has gagging and tries to vomit but nothing comes up.  Neurological: She is alert and oriented to person, place, and time. No cranial nerve deficit. She exhibits normal muscle tone. Coordination normal.  Skin: Skin is warm and dry.  Psychiatric: She has a normal mood and affect.     ED Treatments / Results  Labs (all labs ordered are listed, but only abnormal results are displayed) Labs Reviewed  CBC WITH DIFFERENTIAL/PLATELET  LIPASE, BLOOD  COMPREHENSIVE METABOLIC PANEL    EKG None  Radiology No results found.  Procedures Procedures (including critical care time)  Medications Ordered in ED Medications  sodium chloride 0.9 % bolus 1,000 mL (1,000 mLs Intravenous New Bag/Given 12/02/17 2054)    Followed by  0.9 %  sodium chloride infusion (has no administration in time range)  glucagon (human recombinant) (GLUCAGEN) injection 1 mg (has no administration in time range)  pantoprazole (PROTONIX) injection 40 mg (has no administration in time range)     Initial Impression / Assessment and Plan / ED Course  I have reviewed the triage vital signs and the nursing notes.  Pertinent labs & imaging results that were available during my care of the patient were reviewed by me and considered in my medical decision making (see chart for details).  Consult: Dr. Benson Norway gastroenterology requests patient  be sent to [facility] emergency department for planned upper endoscopy.  Please call him upon arrival to the emergency department.  Consult: Dr. Leonette Monarch emergency physician accepts for transfer to emergency department.  Final Clinical Impressions(s) / ED Diagnoses   Final diagnoses:  Food impaction of esophagus, initial encounter   Patient presents with typical history of esophageal food impaction.  Patient is alert and nontoxic.  No respiratory distress.  Airway stable.  Plan to transfer to [facility] emergency department for upper endoscopy by Dr. Benson Norway. ED Discharge Orders    None       Charlesetta Shanks, MD 12/02/17 2106

## 2017-12-02 NOTE — ED Triage Notes (Signed)
Pt reports that she has a piece of meat stuck in her throat since last night. Pt is handling secretions well. States this has happened before. Pt states she is having some difficulty swallowing liquids.

## 2017-12-02 NOTE — Consult Note (Signed)
Reason for Consult: Food Impaction Referring Physician: ER  Rae Roam Grabel HPI: This is a 47 year old female with a PMH of EoE who presents with a food impaction.  She reports that the onset of her symptoms started at 11 PM last evening when she ate three thin slices of [place] Broil.  Since that time she has not been able to manage her oral secretions.  She thought that she was able to vomit up the majority of the food bolus, but she continues to have a dysphagia sensation in her upper throat.  The patient attempted remedies that she found on the Internet without any benefit.  Her last EGD was with Dr. Ardis Hughs 08/2016 with findings of EoE in the proximal esophageal biopsies.  There was no evidence of any overt stricture.  She reports being compliant with her daily PPI.  Past Medical History:  Diagnosis Date  . Anxiety   . Arthritis    left knee  . Astigmatism    both eyes  . Cancer ([place])    melanoma removed from back   . Cataract    left eye  . Depression   . Diverticulosis   . Erosive esophagitis   . Family history of ovarian cancer   . Family history of prostate cancer   . Family history of uterine cancer   . 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 GERD (gastroesophageal reflux disease)   . H/O urinary infection   . Headache   . History of hiatal hernia   . IBS (irritable bowel syndrome)    no current med.  . Jaundice   . MRSA (methicillin resistant Staphylococcus aureus)    hospitalized for 48 hours in Dec 2016  . Staph infection    "in my blood"    Past Surgical History:  Procedure Laterality Date  . CHOLECYSTECTOMY  08/02/2012   Procedure: LAPAROSCOPIC CHOLECYSTECTOMY WITH INTRAOPERATIVE CHOLANGIOGRAM;  Surgeon: Rolm Bookbinder, MD;  Location: [facility];  Service: General;  Laterality: N/A;  . COLONOSCOPY WITH PROPOFOL  04/09/2014  . COLONOSCOPY WITH PROPOFOL N/A 07/14/2016   Procedure: COLONOSCOPY WITH PROPOFOL;  Surgeon: Milus Banister, MD;  [facility];  Service: Endoscopy;  Laterality: N/A;  . DILATION AND CURETTAGE OF UTERUS     w/ hysteroscopy to remove cyst  . ESOPHAGOGASTRODUODENOSCOPY (EGD) WITH PROPOFOL  04/09/2014  . ESOPHAGOGASTRODUODENOSCOPY (EGD) WITH PROPOFOL N/A 07/14/2016   Procedure: ESOPHAGOGASTRODUODENOSCOPY (EGD) WITH PROPOFOL;  Surgeon: Milus Banister, MD;  Location[facility];  Service: Endoscopy;  Laterality: N/A;  . HYSTEROSCOPY W/D&C  06/22/2012   Procedure: DILATATION AND CURETTAGE /HYSTEROSCOPY;  Surgeon: Lahoma Crocker, MD;  Location: [facility];  Service: Gynecology;  Laterality: N/A;  . KNEE ARTHROSCOPY  2011   left  . ORIF TOE FRACTURE Left 11/05/2014   Procedure: OPEN REDUCTION INTERNAL FIXATION (ORIF) LEFT FIFTH METATARSAL (TOE) FRACTURE WITH CALCANEAL BONE GRAFT;  Surgeon: Dorna Leitz, MD;  Location: [facility];  Service: Orthopedics;  Laterality: Left;  . WISDOM TOOTH EXTRACTION      Family History  Problem Relation Age of Onset  . Heart disease Father   . Stroke Father   . Heart attack Father   . Skin cancer Father   . Diabetes Father   . Uterine cancer Mother 72  . COPD Mother   . Cancer - Colon Maternal Grandfather        mets to bone  . Stomach cancer Paternal Grandmother   .  Cervical cancer Sister        dx between 48-33  . Migraines Sister   . Prostate cancer Maternal Uncle 59       mets to bone  . Ovarian cancer Paternal Aunt   . Heart attack Paternal Grandfather   . Skin cancer Paternal Aunt     Social History:  reports that she has been smoking cigarettes.  She has a 33.00 pack-year smoking history. She has never used smokeless tobacco. She reports that she does not drink alcohol or use drugs.  Allergies:  Allergies  Allergen Reactions  . Codeine Nausea And Vomiting    "Pt can take Vicodin if given with promethazine"    Medications:  Scheduled:  Continuous: . sodium chloride 125 mL/hr at 12/02/17 2310    Results for  orders placed or performed during the hospital encounter of 12/02/17 (from the past 24 hour(s))  CBC with Differential     Status: Abnormal   Collection Time: 12/02/17  8:52 PM  Result Value Ref Range   WBC 13.1 (H) 4.0 - 10.5 K/uL   RBC 4.16 3.87 - 5.11 MIL/uL   Hemoglobin 13.5 12.0 - 15.0 g/dL   HCT 41.4 36.0 - 46.0 %   MCV 99.5 78.0 - 100.0 fL   MCH 32.5 26.0 - 34.0 pg   MCHC 32.6 30.0 - 36.0 g/dL   RDW 13.6 11.5 - 15.5 %   Platelets 385 150 - 400 K/uL   Neutrophils Relative % 88 %   Neutro Abs 11.5 (H) 1.7 - 7.7 K/uL   Lymphocytes Relative 7 %   Lymphs Abs 0.9 0.7 - 4.0 K/uL   Monocytes Relative 4 %   Monocytes Absolute 0.5 0.1 - 1.0 K/uL   Eosinophils Relative 1 %   Eosinophils Absolute 0.1 0.0 - 0.7 K/uL   Basophils Relative 0 %   Basophils Absolute 0.0 0.0 - 0.1 K/uL  Lipase, blood     Status: None   Collection Time: 12/02/17  8:52 PM  Result Value Ref Range   Lipase 21 11 - 51 U/L  Comprehensive metabolic panel     Status: Abnormal   Collection Time: 12/02/17  8:52 PM  Result Value Ref Range   Sodium 139 135 - 145 mmol/L   Potassium 4.1 3.5 - 5.1 mmol/L   Chloride 104 101 - 111 mmol/L   CO2 24 22 - 32 mmol/L   Glucose, Bld 107 (H) 65 - 99 mg/dL   BUN 8 6 - 20 mg/dL   Creatinine, Ser 0.75 0.44 - 1.00 mg/dL   Calcium 9.2 8.9 - 10.3 mg/dL   Total Protein 6.9 6.5 - 8.1 g/dL   Albumin 3.6 3.5 - 5.0 g/dL   AST 18 15 - 41 U/L   ALT 16 14 - 54 U/L   Alkaline Phosphatase 70 38 - 126 U/L   Total Bilirubin 0.4 0.3 - 1.2 mg/dL   GFR calc non Af Amer >60 >60 mL/min   GFR calc Af Amer >60 >60 mL/min   Anion gap 11 5 - 15     Dg Chest 2 View  Result Date: 12/02/2017 CLINICAL DATA:  Foreign body (london broil) in throat since last pm, unable to pass liquids or solids. EXAM: CHEST - 2 VIEW COMPARISON:  12/12/2015 FINDINGS: The heart size and mediastinal contours are within normal limits. Both lungs are clear. The visualized skeletal structures are unremarkable. No radiopaque  foreign bodies. No obvious distention of the esophagus. Surgical clips in the right  upper quadrant. IMPRESSION: No active cardiopulmonary disease. No radiopaque foreign bodies identified. Electronically Signed   By: Lucienne Capers M.D.   On: 12/02/2017 21:47    ROS:  As stated above in the HPI otherwise negative.  Blood pressure 130/85, pulse 90, temperature 98.9 F (37.2 C), temperature source Oral, resp. rate 18, height 5\' 2"  (1.575 m), weight 79.4 kg (175 lb), last menstrual period 06/14/2012, SpO2 96 %.    PE: Gen: NAD, Alert and Oriented HEENT:  [place]/AT, EOMI Neck: Supple, no LAD Lungs: CTA Bilaterally CV: RRR without M/G/R ABM: Soft, NTND, +BS Ext: No C/C/E  Assessment/Plan: 1) Food impaction - EGD with disimpaction will be performed.  If the food bolus is too high up in the esophagus she will need anesthesia to protect her airway.  Nyimah Shadduck D 12/02/2017, 11:28 PM

## 2017-12-02 NOTE — ED Provider Notes (Signed)
[facility] EMERGENCY DEPARTMENT Provider Note   CSN: [id number] Arrival date & time: 12/02/17  1814     History   Chief Complaint Chief Complaint  Patient presents with  . Foreign body in throat    HPI [name] is a 47 y.o. female.  HPI   Patient is a 47 year old female with a history of erosive esophagitis who presents the ED today complaining of foreign body to her throat.  States she was eating meat yesterday and some got stuck in her throat.  She has been unable to swallow liquids or solids since this occurred, states she vomits when she tries to swallow anything.  She is complaining of pain to her throat and chest.  Also has pain to the upper abdominal area.  She is also complaining of nausea and diarrhea. No blood in stool.  Patient was seen at med center [facility] in the ED prior to arrival and sent here According to prior note patient has a lumbar GI in the past and has had an episode of this which occurred previously. Dr. Benson Norway from GI was consulted and pt was transferred here for upper endoscopy.   Past Medical History:  Diagnosis Date  . Anxiety   . Arthritis    left knee  . Astigmatism    both eyes  . Cancer ([place])    melanoma removed from back   . Cataract    left eye  . Depression   . Diverticulosis   . Erosive esophagitis   . Family history of ovarian cancer   . Family history of prostate cancer   . Family history of uterine cancer   . 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 GERD (gastroesophageal reflux disease)   . H/O urinary infection   . Headache   . History of hiatal hernia   . IBS (irritable bowel syndrome)    no current med.  . Jaundice   . MRSA (methicillin resistant Staphylococcus aureus)    hospitalized for 48 hours in Dec 2016  . Staph infection    "in my blood"    Patient Active Problem List   Diagnosis Date Noted  . Inadequate sleep hygiene 04/06/2017  . Snoring  04/06/2017  . Sleep apnea 04/06/2017  . Sleep deprivation 04/06/2017  . Genetic testing 08/24/2016  . Family history of ovarian cancer   . Family history of uterine cancer   . Family history of prostate cancer   . Dysphagia 06/23/2016  . Cellulitis 03/02/2016  . Loss of weight 02/04/2016  . Diarrhea of presumed infectious origin 02/04/2016  . Generalized abdominal pain 02/04/2016  . Internal hemorrhoid, bleeding 04/24/2014  . Irregular menstrual cycle 06/22/2012  . TOBACCO ABUSE 10/19/2009  . SYNCOPE 10/19/2009  . CHEST PAIN 10/19/2009  . SYNCOPE, HX OF 10/19/2009  . CANDIDIASIS OF THE ESOPHAGUS 06/17/2009  . LEG PAIN, LEFT 12/22/2008  . HAIR LOSS 06/03/2008  . HEMORRHOIDS, INTERNAL, WITH BLEEDING 11/01/2007  . VITAMIN B12 DEFICIENCY 09/05/2007  . DYSPLASTIC NEVUS 08/14/2007  . THROMBOCYTOSIS 07/03/2007  . Diarrhea 07/03/2007  . 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 HYPERCHOLESTEROLEMIA 05/31/2007  . DISORDER, BIPOLAR NEC 05/31/2007  . PSORIASIS 05/31/2007  . INSOMNIA 05/31/2007  . COCAINE ABUSE 05/14/2007  . PERIODONTAL DISEASE 05/14/2007  . Gastritis and gastroduodenitis 05/14/2007  . DIVERTICULOSIS, COLON, HX OF 05/14/2007  . MYALGIA, HX OF 05/14/2007    Past Surgical History:  Procedure Laterality Date  .  CHOLECYSTECTOMY  08/02/2012   Procedure: LAPAROSCOPIC CHOLECYSTECTOMY WITH INTRAOPERATIVE CHOLANGIOGRAM;  Surgeon: Rolm Bookbinder, MD;  Location: [facility];  Service: General;  Laterality: N/A;  . COLONOSCOPY WITH PROPOFOL  04/09/2014  . COLONOSCOPY WITH PROPOFOL N/A 07/14/2016   Procedure: COLONOSCOPY WITH PROPOFOL;  Surgeon: Milus Banister, MD;  Location: [facility];  Service: Endoscopy;  Laterality: N/A;  . DILATION AND CURETTAGE OF UTERUS     w/ hysteroscopy to remove cyst  . ESOPHAGOGASTRODUODENOSCOPY (EGD) WITH PROPOFOL  04/09/2014  . ESOPHAGOGASTRODUODENOSCOPY (EGD) WITH PROPOFOL N/A 07/14/2016   Procedure: ESOPHAGOGASTRODUODENOSCOPY (EGD) WITH PROPOFOL;   Surgeon: Milus Banister, MD;  Location: [facility];  Service: Endoscopy;  Laterality: N/A;  . HYSTEROSCOPY W/D&C  06/22/2012   Procedure: DILATATION AND CURETTAGE /HYSTEROSCOPY;  Surgeon: Lahoma Crocker, MD;  Location: [facility];  Service: Gynecology;  Laterality: N/A;  . KNEE ARTHROSCOPY  2011   left  . ORIF TOE FRACTURE Left 11/05/2014   Procedure: OPEN REDUCTION INTERNAL FIXATION (ORIF) LEFT FIFTH METATARSAL (TOE) FRACTURE WITH CALCANEAL BONE GRAFT;  Surgeon: Dorna Leitz, MD;  Location: [facility];  Service: Orthopedics;  Laterality: Left;  . WISDOM TOOTH EXTRACTION       OB History   None      Home Medications    Prior to Admission medications   Medication Sig Start Date End Date Taking? Authorizing Provider  citalopram (CELEXA) 20 MG tablet Take 40 mg by mouth at bedtime.  03/07/17  Yes [provider]  clonazePAM (KLONOPIN) 0.5 MG tablet Take 1 mg by mouth daily.    Yes [provider]  doxepin (SINEQUAN) 10 MG capsule Take 100 mg by mouth at bedtime.    Yes [provider]  meloxicam (MOBIC) 15 MG tablet TAKE 1 TABLET BY MOUTH AT ONSET OF MIGRAINE 03/09/17  Yes [provider]  temazepam (RESTORIL) 15 MG capsule Take 15 mg by mouth at bedtime.   Yes [provider]  cholestyramine (QUESTRAN) 4 g packet TAKE 1 PACKET BY MOUTH 2 TIMES DAILY Patient not taking: Reported on 12/03/2017 07/12/17   Milus Banister, MD  clobetasol ointment (TEMOVATE) 1.88 % Apply 1 application topically 2 (two) times daily. Patient not taking: Reported on 12/03/2017 07/19/16   Kandis Cocking A, CNM  clotrimazole (LOTRIMIN) 1 % cream Apply to affected area 2 times daily Patient not taking: Reported on 12/03/2017 01/26/17   Charlann Lange, PA-C  dicyclomine (BENTYL) 10 MG capsule Take 2 capsules (20 mg total) 3 (three) times daily by mouth. 07/04/17 10/02/17  Milus Banister, MD  pantoprazole (PROTONIX) 40 MG tablet Take 1 tablet (40 mg total) by  mouth daily. Patient not taking: Reported on 12/03/2017 06/22/16   Zehr, Laban Emperor, PA-C  promethazine (PHENERGAN) 25 MG suppository Place 1 suppository (25 mg total) rectally every 6 (six) hours as needed for nausea or vomiting. Patient not taking: Reported on 12/03/2017 01/26/17   Charlann Lange, PA-C  sucralfate (CARAFATE) 1 GM/10ML suspension Take 10 mLs (1 g total) by mouth 4 (four) times daily -  with meals and at bedtime. Patient not taking: Reported on 12/03/2017 01/26/17   Charlann Lange, PA-C  triamcinolone ointment (KENALOG) 0.1 % Apply 1 application topically 4 (four) times daily. Patient not taking: Reported on 12/03/2017 06/14/16   Morene Crocker, CNM    Family History Family History  Problem Relation Age of Onset  . Heart disease Father   . Stroke Father   . Heart attack Father   .  Skin cancer Father   . Diabetes Father   . Uterine cancer Mother 47  . COPD Mother   . Cancer - Colon Maternal Grandfather        mets to bone  . Stomach cancer Paternal Grandmother   . Cervical cancer Sister        dx between 77-33  . Migraines Sister   . Prostate cancer Maternal Uncle 59       mets to bone  . Ovarian cancer Paternal Aunt   . Heart attack Paternal Grandfather   . Skin cancer Paternal Aunt     Social History Social History   Tobacco Use  . Smoking status: Current Every Day Smoker    Packs/day: 1.00    Years: 33.00    Pack years: 33.00    Types: Cigarettes  . Smokeless tobacco: Never Used  . Tobacco comment: form given 02-04-16   Substance Use Topics  . Alcohol use: No    Alcohol/week: 0.0 oz  . Drug use: No    Types: Marijuana    Comment: history of use     Allergies   Codeine   Review of Systems Review of Systems  HENT:       FB sensation in throat, throat pain, difficulty swallowing  Eyes: Negative for visual disturbance.  Respiratory: Negative for cough.   Cardiovascular: Positive for chest pain.  Gastrointestinal: Positive for abdominal pain,  diarrhea and nausea. Negative for vomiting.  Genitourinary: Negative for flank pain.  Musculoskeletal: Negative for back pain.  Skin: Negative for rash.  Neurological: Negative for headaches.     Physical Exam Updated Vital Signs BP 116/70   Pulse 76   Temp 98.9 F (37.2 C) (Oral)   Resp 15   Ht 5\' 2"  (1.575 m)   Wt 79.4 kg (175 lb)   LMP 06/14/2012   SpO2 97%   BMI 32.01 kg/m   Physical Exam  Constitutional: She appears well-developed and well-nourished. No distress.  HENT:  Head: Normocephalic and atraumatic.  Patent airway. No obvious FB to posterior pharynx. Tolerating secretions.  Eyes: Conjunctivae are normal.  Neck: Neck supple.  Cardiovascular: Normal rate, regular rhythm and normal heart sounds.  No murmur heard. Pulmonary/Chest: Effort normal and breath sounds normal. No respiratory distress. She has no wheezes.  Abdominal: Soft. There is tenderness (epigastric).  Musculoskeletal: She exhibits no edema.  Neurological: She is alert.  Skin: Skin is warm and dry. Capillary refill takes less than 2 seconds.  Psychiatric: She has a normal mood and affect.  Nursing note and vitals reviewed.    ED Treatments / Results  Labs (all labs ordered are listed, but only abnormal results are displayed) Labs Reviewed  CBC WITH DIFFERENTIAL/PLATELET - Abnormal; Notable for the following components:      Result Value   WBC 13.1 (*)    Neutro Abs 11.5 (*)    All other components within normal limits  COMPREHENSIVE METABOLIC PANEL - Abnormal; Notable for the following components:   Glucose, Bld 107 (*)    All other components within normal limits  LIPASE, BLOOD    EKG None  Radiology Dg Chest 2 View  Result Date: 12/02/2017 CLINICAL DATA:  Foreign body ([place]) in throat since last pm, unable to pass liquids or solids. EXAM: CHEST - 2 VIEW COMPARISON:  12/12/2015 FINDINGS: The heart size and mediastinal contours are within normal limits. Both lungs are clear.  The visualized skeletal structures are unremarkable. No radiopaque foreign bodies. No obvious  distention of the esophagus. Surgical clips in the right upper quadrant. IMPRESSION: No active cardiopulmonary disease. No radiopaque foreign bodies identified. Electronically Signed   By: Lucienne Capers M.D.   On: 12/02/2017 21:47    Procedures Procedures (including critical care time)  Medications Ordered in ED Medications  sodium chloride 0.9 % bolus 1,000 mL (0 mLs Intravenous Stopped 12/02/17 2151)    Followed by  0.9 %  sodium chloride infusion (0 mLs Intravenous Stopped 12/03/17 0142)  glucagon (human recombinant) (GLUCAGEN) injection 1 mg (1 mg Intravenous Given 12/02/17 2117)  pantoprazole (PROTONIX) injection 40 mg (40 mg Intravenous Given 12/02/17 2115)     Initial Impression / Assessment and Plan / ED Course  I have reviewed the triage vital signs and the nursing notes.  Pertinent labs & imaging results that were available during my care of the patient were reviewed by me and considered in my medical decision making (see chart for details).   11:51 PM Dr. Benson Norway from gastroenterology at bedside. States he will be performing EGD and plan is to discharge pt.   12:20 PM Dr. Benson Norway completed EGD procedure and states that plan is still to d/c pt.   1:30 PM Rechecked pt. States she has a sore throat, but she has been able to tolerate fluids. No chest pain or SOB.  Final Clinical Impressions(s) / ED Diagnoses   Final diagnoses:  Food impaction of esophagus, initial encounter    Patient transferred to from med center [facility] with plan to have EGD due to food impaction in esophagus.  Dr. Annamaria Boots from gastroenterology completed EGD in ED.  Patient had no significant complaints following EGD other than sore throat.  Has been able to tolerate liquids in the ED.  Abdominal exam is benign.  Vital signs are stable and patient is in no acute distress.  She is nontoxic.  Gastroenterology recommended  discharge following EGD procedure.  Feel the patient is safe for discharge at this time.  Gave her referral to gastroenterology and advised patient to contact office to schedule appointment to follow-up later this week.  Advised to return to the ER for any new or worsening symptoms in the meantime including any chest pain shortness of breath, abdominal pain, worsening sore throat.  Advised Tylenol for sore throat.  All questions answered and patient and her family member understand plan and reasons to return.  ED Discharge Orders    None       Bishop Dublin 12/03/17 0145    Ripley Fraise, MD 12/03/17 [phone]

## 2017-12-02 NOTE — ED Notes (Signed)
Patient stated that she throw up last night around 1000 pm and and some of the meat came out but she still think that there are still some left on her throat.  She has difficulty swallowing.

## 2017-12-02 NOTE — ED Notes (Addendum)
GI at bedside

## 2017-12-02 NOTE — ED Notes (Signed)
Endoscopy Team at bedside.

## 2017-12-03 ENCOUNTER — Encounter (HOSPITAL_COMMUNITY): Payer: Self-pay | Admitting: Gastroenterology

## 2017-12-03 NOTE — ED Notes (Signed)
ED Provider at bedside. 

## 2017-12-03 NOTE — Op Note (Signed)
[facility] Patient Name: [name] Procedure Date : 12/02/2017 MRN: [id number] Attending MD: Carol Ada , MD Date of Birth: November 20, 1970 CSN: [id number] Age: 47 Admit Type: Outpatient Procedure:                Upper GI endoscopy Indications:              Dysphagia Providers:                Carol Ada, MD, Burtis Junes, RN, Elspeth Cho,                            Technician Referring MD:              Medicines:                Fentanyl 100 micrograms IV, Midazolam 12 mg IV,                            Diphenhydramine 25 mg IV Complications:            No immediate complications. Estimated Blood Loss:     Estimated blood loss was minimal. Procedure:                Pre-Anesthesia Assessment:                           - Prior to the procedure, a History and Physical                            was performed, and patient medications and                            allergies were reviewed. The patient's tolerance of                            previous anesthesia was also reviewed. The risks                            and benefits of the procedure and the sedation                            options and risks were discussed with the patient.                            All questions were answered, and informed consent                            was obtained. Prior Anticoagulants: The patient has                            taken no previous anticoagulant or antiplatelet                            agents. ASA Grade Assessment: II - A patient with  mild systemic disease. After reviewing the risks                            and benefits, the patient was deemed in                            satisfactory condition to undergo the procedure.                           - Sedation was administered by an endoscopy nurse.                            The sedation level attained was moderate.                           After obtaining informed consent, the endoscope  was                            passed under direct vision. Throughout the                            procedure, the patient's blood pressure, pulse, and                            oxygen saturations were monitored continuously. The                            EG-[id number] ([id number]) scope was introduced through the                            mouth, and advanced to the second part of duodenum.                            The upper GI endoscopy was technically difficult                            and complex. The patient tolerated the procedure. Scope In: Scope Out: Findings:      Food was found in the lower third of the esophagus. Removal of food was       accomplished.      One benign-appearing, intrinsic moderate stenosis was found at the       gastroesophageal junction. This stenosis measured 1 cm (in length). The       stenosis was traversed.      A 3 cm hiatal hernia was present.      The stomach was normal.      The examined duodenum was normal.      In the distal esophagus a large meat bolus was encountered. Attempts to       use the endoscope to push the bolus through the esophagus was not       possible as there was too much resistance. Using the Talon graspers the       meat bolus was debulked in size. A Jabier Mutton net was then used to collect the       broken up particles of meat. With  the meat bolus smaller it was able to       be pushed through with relative ease. Examination of the distal       esophagus revealed a macerated mucosa secondary to the prolonged lodging       of the meat bolus. There was evidence of a stenosis that will require       dilation. Impression:               - Food in the lower third of the esophagus. Removal                            was successful.                           - Benign-appearing esophageal stenosis.                           - 3 cm hiatal hernia.                           - Normal stomach.                           - Normal examined  duodenum. Recommendation:           - Patient has a contact number available for                            emergencies. The signs and symptoms of potential                            delayed complications were discussed with the                            patient. Return to normal activities tomorrow.                            Written discharge instructions were provided to the                            patient.                           - Mechanical soft diet.                           - Continue present medications.                           - Follow up with Dr. Ardis Hughs. Procedure Code(s):        --- Professional ---                           [phone], Esophagogastroduodenoscopy, flexible,                            transoral; with removal of foreign body(s) Diagnosis Code(s):        --- Professional ---  Y10.175Z, Food in esophagus causing other injury,                            initial encounter                           K22.2, Esophageal obstruction                           K44.9, Diaphragmatic hernia without obstruction or                            gangrene                           R13.10, Dysphagia, unspecified CPT copyright 2017 American Medical Association. All rights reserved. The codes documented in this report are preliminary and upon coder review may  be revised to meet current compliance requirements. Carol Ada, MD Carol Ada, MD 12/03/2017 6:17:26 PM This report has been signed electronically. Number of Addenda: 0

## 2017-12-03 NOTE — ED Notes (Signed)
25mg  benadryl, 161mcg fentanyl, 12mg  versed given by endoscopy team.

## 2017-12-03 NOTE — ED Notes (Signed)
Pt given ice water to drink

## 2017-12-03 NOTE — Discharge Instructions (Addendum)
You may take Tylenol for the pain in your throat.  May take 650 mg Tylenol every 6 hours.  You need to follow-up with gastroenterology.  You were given information to call Dr. Ulyses Amor office to schedule an appointment.  Please try to follow-up within 1 week for recheck.  Return to the emergency department for any new or worsening symptoms including ability to swallow, worsening pain in your throat, chest pain, or shortness of breath.

## 2017-12-03 NOTE — ED Notes (Signed)
Pt departed in NAD.  

## 2017-12-13 ENCOUNTER — Inpatient Hospital Stay: Admission: RE | Admit: 2017-12-13 | Payer: Medicare Other | Source: Ambulatory Visit

## 2017-12-13 ENCOUNTER — Ambulatory Visit: Payer: Medicare Other

## 2018-01-09 ENCOUNTER — Encounter

## 2018-01-09 ENCOUNTER — Ambulatory Visit: Payer: Medicare Other | Admitting: Neurology

## 2018-01-24 ENCOUNTER — Emergency Department (HOSPITAL_COMMUNITY)
Admission: EM | Admit: 2018-01-24 | Discharge: 2018-01-24 | Discharge status: Against Medical Advice | Payer: Medicare Other

## 2018-02-26 ENCOUNTER — Other Ambulatory Visit: Payer: Self-pay

## 2018-02-26 ENCOUNTER — Encounter (HOSPITAL_COMMUNITY): Payer: Self-pay | Admitting: Emergency Medicine

## 2018-02-26 ENCOUNTER — Emergency Department (HOSPITAL_COMMUNITY)
Admission: EM | Admit: 2018-02-26 | Discharge: 2018-02-26 | Disposition: A | Discharge status: Home / Self Care | Payer: Medicare Other | Attending: Emergency Medicine | Admitting: Emergency Medicine

## 2018-02-26 DIAGNOSIS — R2241 Localized swelling, mass and lump, right lower limb: Secondary | ICD-10-CM | POA: Diagnosis present

## 2018-02-26 DIAGNOSIS — Z5321 Procedure and treatment not carried out due to patient leaving prior to being seen by health care provider: Secondary | ICD-10-CM | POA: Insufficient documentation

## 2018-02-26 NOTE — ED Notes (Signed)
Pt states she is unable to stay long and will come back in morning. Pt aware of risks

## 2018-02-26 NOTE — ED Triage Notes (Addendum)
Pt c/o right lower leg swelling and pain x February. Went to pcp today for check up and wanted her to come get [place]. I called Simona Huh, pts NP and she stated wanted pt to get checked out for PE and DVT through ED

## 2018-03-28 ENCOUNTER — Ambulatory Visit: Payer: Medicare Other

## 2018-03-28 ENCOUNTER — Other Ambulatory Visit: Payer: Medicare Other

## 2018-04-27 ENCOUNTER — Encounter (HOSPITAL_COMMUNITY): Payer: Self-pay | Admitting: *Deleted

## 2018-04-27 ENCOUNTER — Emergency Department (HOSPITAL_COMMUNITY)
Admission: EM | Admit: 2018-04-27 | Discharge: 2018-04-27 | Disposition: A | Discharge status: Home / Self Care | Payer: Medicare Other | Attending: Emergency Medicine | Admitting: Emergency Medicine

## 2018-04-27 DIAGNOSIS — Z85828 Personal history of other malignant neoplasm of skin: Secondary | ICD-10-CM | POA: Diagnosis not present

## 2018-04-27 DIAGNOSIS — M159 Polyosteoarthritis, unspecified: Secondary | ICD-10-CM | POA: Insufficient documentation

## 2018-04-27 DIAGNOSIS — W19XXXA Unspecified fall, initial encounter: Secondary | ICD-10-CM | POA: Diagnosis not present

## 2018-04-27 DIAGNOSIS — M1990 Unspecified osteoarthritis, unspecified site: Secondary | ICD-10-CM

## 2018-04-27 DIAGNOSIS — Z79899 Other long term (current) drug therapy: Secondary | ICD-10-CM | POA: Diagnosis not present

## 2018-04-27 LAB — CBC WITH DIFFERENTIAL/PLATELET
BASOS ABS: 0 10*3/uL (ref 0.0–0.1)
Basophils Relative: 0 %
EOS ABS: 0.1 10*3/uL (ref 0.0–0.7)
Eosinophils Relative: 1 %
HCT: 41.5 % (ref 36.0–46.0)
HEMOGLOBIN: 13.6 g/dL (ref 12.0–15.0)
LYMPHS ABS: 1.7 10*3/uL (ref 0.7–4.0)
Lymphocytes Relative: 20 %
MCH: 31.7 pg (ref 26.0–34.0)
MCHC: 32.8 g/dL (ref 30.0–36.0)
MCV: 96.7 fL (ref 78.0–100.0)
Monocytes Absolute: 0.5 10*3/uL (ref 0.1–1.0)
Monocytes Relative: 5 %
NEUTROS PCT: 74 %
Neutro Abs: 6.3 10*3/uL (ref 1.7–7.7)
PLATELETS: 399 10*3/uL (ref 150–400)
RBC: 4.29 MIL/uL (ref 3.87–5.11)
RDW: 12.8 % (ref 11.5–15.5)
WBC: 8.6 10*3/uL (ref 4.0–10.5)

## 2018-04-27 LAB — BASIC METABOLIC PANEL
ANION GAP: 10 (ref 5–15)
BUN: 7 mg/dL (ref 6–20)
CHLORIDE: 101 mmol/L (ref 98–111)
CO2: 31 mmol/L (ref 22–32)
Calcium: 9.3 mg/dL (ref 8.9–10.3)
Creatinine, Ser: 0.97 mg/dL (ref 0.44–1.00)
GFR calc Af Amer: >60 mL/min (ref >60)
Glucose, Bld: 112 mg/dL — ABNORMAL HIGH (ref 70–99)
POTASSIUM: 3.5 mmol/L (ref 3.5–5.1)
SODIUM: 142 mmol/L (ref 135–145)

## 2018-04-27 MED ORDER — HYDROCODONE-ACETAMINOPHEN 5-325 MG PO TABS
1.0000 | ORAL_TABLET | Freq: Four times a day (QID) | ORAL | Status: DC | PRN
  Administered 2018-04-27: 1 via ORAL
  Filled 2018-04-27: qty 1

## 2018-04-27 NOTE — ED Triage Notes (Signed)
Per EMS, pt complains of increased falls over the past 24 hours. Pt recently diagnosed with rheumatoid arthritis. Pt states she feels weak in her legs and falls. Pt fell 3 times today, hitting her head each time. Pt denies loss of consciousness. Pt is not blood thinners. Pt complains of left sided rib and left wrist pain. Pt believes her symptoms are a side effect of the rheumatoid arthritis medications she has been taking.   BP 120/84 HR 90 SpO2 95% on RA RR 16 CBG 121

## 2018-04-27 NOTE — ED Notes (Signed)
Bed: WC58 Expected date:  Expected time:  Means of arrival:  Comments: 47 yo frequent falls

## 2018-04-27 NOTE — Progress Notes (Addendum)
CSW met with pt who has a boyfriend who is moving in with her to assist with the pt's needs due to the pt's rheumatoid arthritus.  Pt states shew as diagnosed a month ago.  Pt stated she would like help in the home but had no social work needs at this time.  CSW spoke to the EPD who will place a consult for CM with an order for face-to-face and an order for medical equipment and RN/Aide/PT/OT and Social Work.  6:34 PM CSW spoke to CM who will assist pt with providing the above services.  CSW spoke to the pt who chose [facility] and pt is aware [facility] will contact her on 8/31.  Pt prefers PT/OT and a walker, preferably on with a seat for sitting.  CM is aware.  Please reconsult if future social work needs arise.  CSW signing off, as social work intervention is no longer needed.   F. , LCSW, LCAS, CSI Clinical Social Worker Ph: [phone]        

## 2018-04-27 NOTE — Discharge Instructions (Addendum)
Case management will be in touch with you about obtaining a walker.

## 2018-04-27 NOTE — ED Provider Notes (Signed)
[facility] DEPT Provider Note   CSN: [id number] Arrival date & time: 04/27/18  1445     History   Chief Complaint Chief Complaint  Patient presents with  . Fall    HPI [name] is a 47 y.o. female.  Patient with history of rheumatoid arthritis with worsening joint pain over the last several days.  Patient states that her left knee is causing the most pain as well as bilateral wrist pain elbow pain.  No joint swelling, rash, fevers at this time.  Patient has been on prednisone burst prescribed by her chronic pain doctor several days ago.  Patient continues on narcotic pain medicine as well.  Patient denies hitting her head or any loss of consciousness.  She is unable to take care of herself at home does not have access to any assisted devices such as a walker.  Patient is on methotrexate and Humira as well.  The history is provided by the patient.  Fall  This is a recurrent problem. The current episode started more than 2 days ago. The problem occurs every several days. The problem has been gradually improving. Pertinent negatives include no chest pain, no abdominal pain, no headaches and no shortness of breath. The symptoms are aggravated by walking. Nothing relieves the symptoms. Treatments tried: steroids, narcotics. The treatment provided mild relief.    Past Medical History:  Diagnosis Date  . Anxiety   . Arthritis    left knee  . Astigmatism    both eyes  . Cancer (Diagonal)    melanoma removed from back   . Cataract    left eye  . Depression   . Diverticulosis   . Erosive esophagitis   . Family history of ovarian cancer   . Family history of prostate cancer   . Family history of uterine cancer   . 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 GERD (gastroesophageal reflux disease)   . H/O urinary infection   . Headache   . History of hiatal hernia   . IBS (irritable bowel syndrome)    no current med.   . Jaundice   . MRSA (methicillin resistant Staphylococcus aureus)    hospitalized for 48 hours in Dec 2016  . Staph infection    "in my blood"    Patient Active Problem List   Diagnosis Date Noted  . Inadequate sleep hygiene 04/06/2017  . Snoring 04/06/2017  . Sleep apnea 04/06/2017  . Sleep deprivation 04/06/2017  . Genetic testing 08/24/2016  . Family history of ovarian cancer   . Family history of uterine cancer   . Family history of prostate cancer   . Dysphagia 06/23/2016  . Cellulitis 03/02/2016  . Loss of weight 02/04/2016  . Diarrhea of presumed infectious origin 02/04/2016  . Generalized abdominal pain 02/04/2016  . Internal hemorrhoid, bleeding 04/24/2014  . Irregular menstrual cycle 06/22/2012  . TOBACCO ABUSE 10/19/2009  . SYNCOPE 10/19/2009  . CHEST PAIN 10/19/2009  . SYNCOPE, HX OF 10/19/2009  . CANDIDIASIS OF THE ESOPHAGUS 06/17/2009  . LEG PAIN, LEFT 12/22/2008  . HAIR LOSS 06/03/2008  . HEMORRHOIDS, INTERNAL, WITH BLEEDING 11/01/2007  . VITAMIN B12 DEFICIENCY 09/05/2007  . DYSPLASTIC NEVUS 08/14/2007  . THROMBOCYTOSIS 07/03/2007  . Diarrhea 07/03/2007  . 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 HYPERCHOLESTEROLEMIA 05/31/2007  . DISORDER, BIPOLAR NEC 05/31/2007  . PSORIASIS 05/31/2007  . INSOMNIA 05/31/2007  . COCAINE ABUSE 05/14/2007  . PERIODONTAL DISEASE  05/14/2007  . Gastritis and gastroduodenitis 05/14/2007  . DIVERTICULOSIS, COLON, HX OF 05/14/2007  . MYALGIA, HX OF 05/14/2007    Past Surgical History:  Procedure Laterality Date  . CHOLECYSTECTOMY  08/02/2012   Procedure: LAPAROSCOPIC CHOLECYSTECTOMY WITH INTRAOPERATIVE CHOLANGIOGRAM;  Surgeon: Rolm Bookbinder, MD;  Location: [place];  Service: General;  Laterality: N/A;  . COLONOSCOPY WITH PROPOFOL  04/09/2014  . COLONOSCOPY WITH PROPOFOL N/A 07/14/2016   Procedure: COLONOSCOPY WITH PROPOFOL;  Surgeon: Milus Banister, MD;  Location: [facility];  Service: Endoscopy;  Laterality: N/A;  .  DILATION AND CURETTAGE OF UTERUS     w/ hysteroscopy to remove cyst  . ESOPHAGOGASTRODUODENOSCOPY N/A 12/02/2017   Procedure: ESOPHAGOGASTRODUODENOSCOPY (EGD);  Surgeon: Carol Ada, MD;  Location: [facility];  Service: Endoscopy;  Laterality: N/A;  . ESOPHAGOGASTRODUODENOSCOPY (EGD) WITH PROPOFOL  04/09/2014  . ESOPHAGOGASTRODUODENOSCOPY (EGD) WITH PROPOFOL N/A 07/14/2016   Procedure: ESOPHAGOGASTRODUODENOSCOPY (EGD) WITH PROPOFOL;  Surgeon: Milus Banister, MD;  Location: [facility];  Service: Endoscopy;  Laterality: N/A;  . HYSTEROSCOPY W/D&C  06/22/2012   Procedure: DILATATION AND CURETTAGE /HYSTEROSCOPY;  Surgeon: Lahoma Crocker, MD;  Location: [facility];  Service: Gynecology;  Laterality: N/A;  . KNEE ARTHROSCOPY  2011   left  . ORIF TOE FRACTURE Left 11/05/2014   Procedure: OPEN REDUCTION INTERNAL FIXATION (ORIF) LEFT FIFTH METATARSAL (TOE) FRACTURE WITH CALCANEAL BONE GRAFT;  Surgeon: Dorna Leitz, MD;  Location: [facility];  Service: Orthopedics;  Laterality: Left;  . WISDOM TOOTH EXTRACTION       OB History   None      Home Medications    Prior to Admission medications   Medication Sig Start Date End Date Taking? Authorizing Provider  citalopram (CELEXA) 40 MG tablet Take 40 mg by mouth daily.  03/12/18  Yes [provider]  clonazePAM (KLONOPIN) 2 MG tablet Take 1 mg by mouth 2 (two) times daily as needed for anxiety.  04/13/18  Yes [provider]  cyclobenzaprine (FLEXERIL) 5 MG tablet Take 5 mg by mouth at bedtime as needed for muscle spasms.  04/19/18  Yes [provider]  dicyclomine (BENTYL) 10 MG capsule Take 20 mg by mouth 3 (three) times daily. 04/16/18  Yes [provider]  doxepin (SINEQUAN) 10 MG capsule Take 100 mg by mouth at bedtime.    Yes [provider]  folic acid (FOLVITE) 1 MG tablet Take 1 mg by mouth daily. 03/22/18  Yes [provider]  gabapentin (NEURONTIN) 300 MG capsule Take 300 mg  by mouth 3 (three) times daily.  04/10/18  Yes [provider]  hydrochlorothiazide (MICROZIDE) 12.5 MG capsule Take 12.5 mg by mouth daily. 02/28/18  Yes [provider]  HYDROcodone-acetaminophen (NORCO/VICODIN) 5-325 MG tablet Take 1 tablet by mouth every 6 (six) hours as needed for moderate pain or severe pain.   Yes [provider]  QUEtiapine (SEROQUEL) 100 MG tablet Take 100 mg by mouth at bedtime.  04/01/18  Yes [provider]  topiramate (TOPAMAX) 50 MG tablet Take 50 mg by mouth at bedtime.  04/11/18  Yes [provider]  cholestyramine (QUESTRAN) 4 g packet TAKE 1 PACKET BY MOUTH 2 TIMES DAILY Patient not taking: Reported on 12/03/2017 07/12/17   Milus Banister, MD  clobetasol ointment (TEMOVATE) 0.24 % Apply 1 application topically 2 (two) times daily. Patient not taking: Reported on 12/03/2017 07/19/16   Kandis Cocking A, CNM  clotrimazole (LOTRIMIN) 1 % cream Apply to affected area 2 times  daily Patient not taking: Reported on 12/03/2017 01/26/17   Charlann Lange, PA-C  dicyclomine (BENTYL) 10 MG capsule Take 2 capsules (20 mg total) 3 (three) times daily by mouth. 07/04/17 10/02/17  Milus Banister, MD  HUMIRA PEN 40 MG/0.4ML PNKT Inject 40 mg into the muscle every 14 (fourteen) days.  04/13/18   [provider]  meloxicam (MOBIC) 7.5 MG tablet Take 7.5 mg by mouth daily as needed for pain.  04/23/18   [provider]  pantoprazole (PROTONIX) 40 MG tablet Take 1 tablet (40 mg total) by mouth daily. Patient not taking: Reported on 12/03/2017 06/22/16   Zehr, Laban Emperor, PA-C  predniSONE (DELTASONE) 5 MG tablet Take 5 mg by mouth as directed.  04/18/18   [provider]  promethazine (PHENERGAN) 25 MG suppository Place 1 suppository (25 mg total) rectally every 6 (six) hours as needed for nausea or vomiting. Patient not taking: Reported on 12/03/2017 01/26/17   Charlann Lange, PA-C  promethazine (PHENERGAN) 25 MG tablet Take 25 mg  by mouth 2 (two) times daily as needed for nausea or vomiting.  04/11/18   [provider]  sucralfate (CARAFATE) 1 GM/10ML suspension Take 10 mLs (1 g total) by mouth 4 (four) times daily -  with meals and at bedtime. Patient not taking: Reported on 12/03/2017 01/26/17   Charlann Lange, PA-C  temazepam (RESTORIL) 30 MG capsule Take 30 mg by mouth at bedtime as needed for sleep.  01/20/18   [provider]  triamcinolone ointment (KENALOG) 0.1 % Apply 1 application topically 4 (four) times daily. Patient not taking: Reported on 12/03/2017 06/14/16   Morene Crocker, CNM    Family History Family History  Problem Relation Age of Onset  . Heart disease Father   . Stroke Father   . Heart attack Father   . Skin cancer Father   . Diabetes Father   . Uterine cancer Mother 22  . COPD Mother   . Cancer - Colon Maternal Grandfather        mets to bone  . Stomach cancer Paternal Grandmother   . Cervical cancer Sister        dx between 26-33  . Migraines Sister   . Prostate cancer Maternal Uncle 59       mets to bone  . Ovarian cancer Paternal Aunt   . Heart attack Paternal Grandfather   . Skin cancer Paternal Aunt     Social History Social History   Tobacco Use  . Smoking status: Current Every Day Smoker    Packs/day: 1.00    Years: 33.00    Pack years: 33.00    Types: Cigarettes  . Smokeless tobacco: Never Used  . Tobacco comment: form given 02-04-16   Substance Use Topics  . Alcohol use: No    Alcohol/week: 0.0 standard drinks  . Drug use: No    Types: Marijuana    Comment: history of use     Allergies   Codeine   Review of Systems Review of Systems  Constitutional: Negative for chills and fever.  HENT: Negative for ear pain and sore throat.   Eyes: Negative for pain and visual disturbance.  Respiratory: Negative for cough and shortness of breath.   Cardiovascular: Negative for chest pain and palpitations.  Gastrointestinal: Negative for abdominal pain  and vomiting.  Genitourinary: Negative for dysuria and hematuria.  Musculoskeletal: Positive for gait problem and joint swelling. Negative for arthralgias and back pain.  Skin: Negative for color change  and rash.  Neurological: Negative for seizures, syncope and headaches.  All other systems reviewed and are negative.    Physical Exam Updated Vital Signs  ED Triage Vitals [04/27/18 1511]  Enc Vitals Group     BP 117/65     Pulse Rate 86     Resp 18     Temp 98.1 F (36.7 C)     Temp Source Oral     SpO2 97 %     Weight      Height      Head Circumference      Peak Flow      Pain Score 10     Pain Loc      Pain Edu?      Excl. in [place]?      Physical Exam  Constitutional: She is oriented to person, place, and time. She appears well-developed and well-nourished. No distress.  HENT:  Head: Normocephalic and atraumatic.  Eyes: Pupils are equal, round, and reactive to light. Conjunctivae and EOM are normal.  Neck: Normal range of motion. Neck supple.  Cardiovascular: Normal rate, regular rhythm, normal heart sounds and intact distal pulses.  No murmur heard. Pulmonary/Chest: Effort normal and breath sounds normal. No respiratory distress.  Abdominal: Soft. There is no tenderness.  Musculoskeletal: Normal range of motion. She exhibits no edema or tenderness.  Neurological: She is alert and oriented to person, place, and time. No cranial nerve deficit or sensory deficit. She exhibits normal muscle tone. Coordination normal.  5+/5 strength, normal sensation, no drift  Skin: Skin is warm and dry. No rash noted.  Psychiatric: She has a normal mood and affect.  Nursing note and vitals reviewed.    ED Treatments / Results  Labs (all labs ordered are listed, but only abnormal results are displayed) Labs Reviewed  BASIC METABOLIC PANEL - Abnormal; Notable for the following components:      Result Value   Glucose, Bld 112 (*)    All other components within normal limits  CBC  WITH DIFFERENTIAL/PLATELET    EKG None  Radiology No results found.  Procedures Procedures (including critical care time)  Medications Ordered in ED Medications  HYDROcodone-acetaminophen (NORCO/VICODIN) 5-325 MG per tablet 1 tablet (1 tablet Oral Given 04/27/18 1539)     Initial Impression / Assessment and Plan / ED Course  I have reviewed the triage vital signs and the nursing notes.  Pertinent labs & imaging results that were available during my care of the patient were reviewed by me and considered in my medical decision making (see chart for details).     [name] is a 47 year old female history of rheumatoid arthritis who presents to the ED with pain in her joints.  Patient with normal vitals.  No fever.  Patient with weakness in her legs due to pain from her rheumatoid arthritis.  Patient has pain in both of her wrist joints and elbow joints.  Patient has no obvious swelling of her joints or rash.  She is currently on prednisone for rheumatoid arthritis flare.  Patient is also on narcotic pain medicine.  She is on methotrexate and Humira for her rheumatoid arthritis.  Patient states that she has had some falls due to pain in her left knee specifically.  She denies any loss of consciousness.  No headaches.  No signs of external trauma on exam.  Patient has no bony tenderness on exam. Initial lab work shows no significant anemia, electrolyte abnormality, kidney injury.  No signs  of trauma on exam and patient likely with pain secondary to rheumatoid flare.  Patient was given Norco for pain.  Contacted social work and case management to help patient obtain a walker which I think will be helpful for her during her acute arthritis flares.  Patient already has follow-up with chronic pain doctor and rheumatologist.  Case management will help arrange for a walker.  Patient stable throughout my care and was discharged from the ED in good condition.  This chart was dictated using voice  recognition software.  Despite best efforts to proofread,  errors can occur which can change the documentation meaning.   Final Clinical Impressions(s) / ED Diagnoses   Final diagnoses:  Arthritis    ED Discharge Orders    None       Lennice Sites, DO 04/27/18 4514

## 2018-04-27 NOTE — Care Management (Signed)
[facility] ED CM received call from [place] concerning EDP recommendations for [facility] services.  CM reviewed record orders placed for [facility], PT, OT, SW and HHA. EDP thinks patient may also benefit from a rolling walker.  CSW spoke with patient [facility] selected referral sent to [facility] start of care 9/2. With rolling walker.  CM will follow up tomorrow concerning referral.

## 2018-05-10 ENCOUNTER — Other Ambulatory Visit: Payer: Medicare Other

## 2018-05-10 ENCOUNTER — Ambulatory Visit: Payer: Medicare Other

## 2018-06-22 ENCOUNTER — Emergency Department (HOSPITAL_COMMUNITY): Payer: Medicare Other

## 2018-06-22 ENCOUNTER — Other Ambulatory Visit: Payer: Self-pay

## 2018-06-22 ENCOUNTER — Emergency Department (HOSPITAL_COMMUNITY)
Admission: EM | Admit: 2018-06-22 | Discharge: 2018-06-22 | Disposition: A | Discharge status: Home / Self Care | Payer: Medicare Other | Attending: Emergency Medicine | Admitting: Emergency Medicine

## 2018-06-22 ENCOUNTER — Encounter (HOSPITAL_COMMUNITY): Payer: Self-pay

## 2018-06-22 DIAGNOSIS — Z85828 Personal history of other malignant neoplasm of skin: Secondary | ICD-10-CM | POA: Diagnosis not present

## 2018-06-22 DIAGNOSIS — X501XXA Overexertion from prolonged static or awkward postures, initial encounter: Secondary | ICD-10-CM | POA: Insufficient documentation

## 2018-06-22 DIAGNOSIS — F17210 Nicotine dependence, cigarettes, uncomplicated: Secondary | ICD-10-CM | POA: Insufficient documentation

## 2018-06-22 DIAGNOSIS — S93401A Sprain of unspecified ligament of right ankle, initial encounter: Secondary | ICD-10-CM | POA: Diagnosis not present

## 2018-06-22 DIAGNOSIS — Y92013 Bedroom of single-family (private) house as the place of occurrence of the external cause: Secondary | ICD-10-CM | POA: Diagnosis not present

## 2018-06-22 DIAGNOSIS — W19XXXA Unspecified fall, initial encounter: Secondary | ICD-10-CM | POA: Diagnosis not present

## 2018-06-22 DIAGNOSIS — Y9389 Activity, other specified: Secondary | ICD-10-CM | POA: Diagnosis not present

## 2018-06-22 DIAGNOSIS — Y998 Other external cause status: Secondary | ICD-10-CM | POA: Insufficient documentation

## 2018-06-22 DIAGNOSIS — Z79899 Other long term (current) drug therapy: Secondary | ICD-10-CM | POA: Insufficient documentation

## 2018-06-22 DIAGNOSIS — S99911A Unspecified injury of right ankle, initial encounter: Secondary | ICD-10-CM | POA: Diagnosis present

## 2018-06-22 MED ORDER — IBUPROFEN 800 MG PO TABS: 800.0000 mg | ORAL_TABLET | Freq: Three times a day (TID) | ORAL | 0 refills | Status: DC

## 2018-06-22 NOTE — ED Provider Notes (Signed)
[name] DEPT Provider Note   CSN: [id number] Arrival date & time: 06/22/18  0209     History   Chief Complaint Chief Complaint  Patient presents with  . Fall  . Ankle Pain    HPI [name] is a 47 y.o. female.  Patient presents to the emergency department with a chief complaint of mechanical fall.  She states that her foot felt like it was asleep and she was getting out of bed tonight.  She fell and rolled her ankle.  She complains of pain over the top and side of her foot.  She has not taken anything for her symptoms.  The history is provided by the patient. No language interpreter was used.    Past Medical History:  Diagnosis Date  . Anxiety   . Arthritis    left knee  . Astigmatism    both eyes  . Cancer ([place])    melanoma removed from back   . Cataract    left eye  . Depression   . Diverticulosis   . Erosive esophagitis   . Family history of ovarian cancer   . Family history of prostate cancer   . Family history of uterine cancer   . 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 GERD (gastroesophageal reflux disease)   . H/O urinary infection   . Headache   . History of hiatal hernia   . IBS (irritable bowel syndrome)    no current med.  . Jaundice   . MRSA (methicillin resistant Staphylococcus aureus)    hospitalized for 48 hours in Dec 2016  . Staph infection    "in my blood"    Patient Active Problem List   Diagnosis Date Noted  . Inadequate sleep hygiene 04/06/2017  . Snoring 04/06/2017  . Sleep apnea 04/06/2017  . Sleep deprivation 04/06/2017  . Genetic testing 08/24/2016  . Family history of ovarian cancer   . Family history of uterine cancer   . Family history of prostate cancer   . Dysphagia 06/23/2016  . Cellulitis 03/02/2016  . Loss of weight 02/04/2016  . Diarrhea of presumed infectious origin 02/04/2016  . Generalized abdominal pain 02/04/2016  . Internal  hemorrhoid, bleeding 04/24/2014  . Irregular menstrual cycle 06/22/2012  . TOBACCO ABUSE 10/19/2009  . SYNCOPE 10/19/2009  . CHEST PAIN 10/19/2009  . SYNCOPE, HX OF 10/19/2009  . CANDIDIASIS OF THE ESOPHAGUS 06/17/2009  . LEG PAIN, LEFT 12/22/2008  . HAIR LOSS 06/03/2008  . HEMORRHOIDS, INTERNAL, WITH BLEEDING 11/01/2007  . VITAMIN B12 DEFICIENCY 09/05/2007  . DYSPLASTIC NEVUS 08/14/2007  . THROMBOCYTOSIS 07/03/2007  . Diarrhea 07/03/2007  . 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 HYPERCHOLESTEROLEMIA 05/31/2007  . DISORDER, BIPOLAR NEC 05/31/2007  . PSORIASIS 05/31/2007  . INSOMNIA 05/31/2007  . COCAINE ABUSE 05/14/2007  . PERIODONTAL DISEASE 05/14/2007  . Gastritis and gastroduodenitis 05/14/2007  . DIVERTICULOSIS, COLON, HX OF 05/14/2007  . MYALGIA, HX OF 05/14/2007    Past Surgical History:  Procedure Laterality Date  . CHOLECYSTECTOMY  08/02/2012   Procedure: LAPAROSCOPIC CHOLECYSTECTOMY WITH INTRAOPERATIVE CHOLANGIOGRAM;  Surgeon: Rolm Bookbinder, MD;  Location: [facility];  Service: General;  Laterality: N/A;  . COLONOSCOPY WITH PROPOFOL  04/09/2014  . COLONOSCOPY WITH PROPOFOL N/A 07/14/2016   Procedure: COLONOSCOPY WITH PROPOFOL;  Surgeon: Milus Banister, MD;  Location: [facility];  Service: Endoscopy;  Laterality: N/A;  . DILATION AND CURETTAGE OF UTERUS     w/  hysteroscopy to remove cyst  . ESOPHAGOGASTRODUODENOSCOPY N/A 12/02/2017   Procedure: ESOPHAGOGASTRODUODENOSCOPY (EGD);  Surgeon: Carol Ada, MD;  Location: [facility];  Service: Endoscopy;  Laterality: N/A;  . ESOPHAGOGASTRODUODENOSCOPY (EGD) WITH PROPOFOL  04/09/2014  . ESOPHAGOGASTRODUODENOSCOPY (EGD) WITH PROPOFOL N/A 07/14/2016   Procedure: ESOPHAGOGASTRODUODENOSCOPY (EGD) WITH PROPOFOL;  Surgeon: Milus Banister, MD;  Location: [facility];  Service: Endoscopy;  Laterality: N/A;  . HYSTEROSCOPY W/D&C  06/22/2012   Procedure: DILATATION AND CURETTAGE /HYSTEROSCOPY;  Surgeon: Lahoma Crocker, MD;   Location: [facility];  Service: Gynecology;  Laterality: N/A;  . KNEE ARTHROSCOPY  2011   left  . ORIF TOE FRACTURE Left 11/05/2014   Procedure: OPEN REDUCTION INTERNAL FIXATION (ORIF) LEFT FIFTH METATARSAL (TOE) FRACTURE WITH CALCANEAL BONE GRAFT;  Surgeon: Dorna Leitz, MD;  Location: [facility];  Service: Orthopedics;  Laterality: Left;  . WISDOM TOOTH EXTRACTION       OB History   None      Home Medications    Prior to Admission medications   Medication Sig Start Date End Date Taking? Authorizing Provider  cholestyramine (QUESTRAN) 4 g packet TAKE 1 PACKET BY MOUTH 2 TIMES DAILY Patient not taking: Reported on 12/03/2017 07/12/17   Milus Banister, MD  citalopram (CELEXA) 40 MG tablet Take 40 mg by mouth daily.  03/12/18   [provider]  clobetasol ointment (TEMOVATE) 0.99 % Apply 1 application topically 2 (two) times daily. Patient not taking: Reported on 12/03/2017 07/19/16   Kandis Cocking A, CNM  clonazePAM (KLONOPIN) 2 MG tablet Take 1 mg by mouth 2 (two) times daily as needed for anxiety.  04/13/18   [provider]  clotrimazole (LOTRIMIN) 1 % cream Apply to affected area 2 times daily Patient not taking: Reported on 12/03/2017 01/26/17   Charlann Lange, PA-C  cyclobenzaprine (FLEXERIL) 5 MG tablet Take 5 mg by mouth at bedtime as needed for muscle spasms.  04/19/18   [provider]  dicyclomine (BENTYL) 10 MG capsule Take 2 capsules (20 mg total) 3 (three) times daily by mouth. 07/04/17 10/02/17  Milus Banister, MD  dicyclomine (BENTYL) 10 MG capsule Take 20 mg by mouth 3 (three) times daily. 04/16/18   [provider]  doxepin (SINEQUAN) 10 MG capsule Take 100 mg by mouth at bedtime.     [provider]  folic acid (FOLVITE) 1 MG tablet Take 1 mg by mouth daily. 03/22/18   [provider]  gabapentin (NEURONTIN) 300 MG capsule Take 300 mg by mouth 3 (three) times daily.  04/10/18   [provider]  HUMIRA PEN  40 MG/0.4ML PNKT Inject 40 mg into the muscle every 14 (fourteen) days.  04/13/18   [provider]  hydrochlorothiazide (MICROZIDE) 12.5 MG capsule Take 12.5 mg by mouth daily. 02/28/18   [provider]  HYDROcodone-acetaminophen (NORCO/VICODIN) 5-325 MG tablet Take 1 tablet by mouth every 6 (six) hours as needed for moderate pain or severe pain.    [provider]  meloxicam (MOBIC) 7.5 MG tablet Take 7.5 mg by mouth daily as needed for pain.  04/23/18   [provider]  pantoprazole (PROTONIX) 40 MG tablet Take 1 tablet (40 mg total) by mouth daily. Patient not taking: Reported on 12/03/2017 06/22/16   Zehr, Laban Emperor, PA-C  predniSONE (DELTASONE) 5 MG tablet Take 5 mg by mouth as directed.  04/18/18   [provider]  promethazine (PHENERGAN) 25 MG suppository Place 1 suppository (25 mg total) rectally every  6 (six) hours as needed for nausea or vomiting. Patient not taking: Reported on 12/03/2017 01/26/17   Charlann Lange, PA-C  promethazine (PHENERGAN) 25 MG tablet Take 25 mg by mouth 2 (two) times daily as needed for nausea or vomiting.  04/11/18   [provider]  QUEtiapine (SEROQUEL) 100 MG tablet Take 100 mg by mouth at bedtime.  04/01/18   [provider]  sucralfate (CARAFATE) 1 GM/10ML suspension Take 10 mLs (1 g total) by mouth 4 (four) times daily -  with meals and at bedtime. Patient not taking: Reported on 12/03/2017 01/26/17   Charlann Lange, PA-C  temazepam (RESTORIL) 30 MG capsule Take 30 mg by mouth at bedtime as needed for sleep.  01/20/18   [provider]  topiramate (TOPAMAX) 50 MG tablet Take 50 mg by mouth at bedtime.  04/11/18   [provider]  triamcinolone ointment (KENALOG) 0.1 % Apply 1 application topically 4 (four) times daily. Patient not taking: Reported on 12/03/2017 06/14/16   Morene Crocker, CNM    Family History Family History  Problem Relation Age of Onset  . Heart disease Father   .  Stroke Father   . Heart attack Father   . Skin cancer Father   . Diabetes Father   . Uterine cancer Mother 70  . COPD Mother   . Cancer - Colon Maternal Grandfather        mets to bone  . Stomach cancer Paternal Grandmother   . Cervical cancer Sister        dx between 20-33  . Migraines Sister   . Prostate cancer Maternal Uncle 59       mets to bone  . Ovarian cancer Paternal Aunt   . Heart attack Paternal Grandfather   . Skin cancer Paternal Aunt     Social History Social History   Tobacco Use  . Smoking status: Current Every Day Smoker    Packs/day: 1.00    Years: 33.00    Pack years: 33.00    Types: Cigarettes  . Smokeless tobacco: Never Used  . Tobacco comment: form given 02-04-16   Substance Use Topics  . Alcohol use: No    Alcohol/week: 0.0 standard drinks  . Drug use: No    Types: Marijuana    Comment: history of use     Allergies   Codeine   Review of Systems Review of Systems  All other systems reviewed and are negative.    Physical Exam Updated Vital Signs BP 114/86   Pulse 86   Temp (!) 97.4 F (36.3 C) (Oral)   Resp 18   Ht 5' (1.524 m)   Wt 77.1 kg   LMP 06/14/2012   SpO2 97%   BMI 33.20 kg/m   Physical Exam Nursing note and vitals reviewed.  Constitutional: Pt appears well-developed and well-nourished. No distress.  HENT:  Head: Normocephalic and atraumatic.  Eyes: Conjunctivae are normal.  Neck: Normal range of motion.  Cardiovascular: Normal rate, regular rhythm. Intact distal pulses.   Capillary refill < 3 sec.  Pulmonary/Chest: Effort normal and breath sounds normal.  Musculoskeletal:  RLE Pt exhibits TTP over anterolateral aspect without bony abnormality or deformity.   ROM: Limited secondary to pain  Strength: Limited secondary to pain Neurological: Pt  is alert. Coordination normal.  Sensation: 5/5 Skin: Skin is warm and dry. Pt is not diaphoretic.  No evidence of open wound or skin tenting Psychiatric: Pt has a  normal mood and affect.  ED Treatments / Results  Labs (all labs ordered are listed, but only abnormal results are displayed) Labs Reviewed - No data to display  EKG None  Radiology Dg Ankle Complete Right  Result Date: 06/22/2018 CLINICAL DATA:  Twisted ankle getting out of bed. Pain and swelling. EXAM: RIGHT ANKLE - COMPLETE 3+ VIEW; RIGHT FOOT COMPLETE - 3+ VIEW COMPARISON:  None. FINDINGS: RIGHT foot: There is no evidence of fracture, dislocation, or joint effusion. Small plantar calcaneal spur. There is no evidence of arthropathy or other focal bone abnormality. Soft tissues are unremarkable. RIGHT ankle: No fracture deformity nor dislocation. The ankle mortise appears congruent and the tibiofibular syndesmosis intact. No destructive bony lesions. Soft tissue planes are non-suspicious. IMPRESSION: Negative. Electronically Signed   By: Elon Alas M.D.   On: 06/22/2018 03:08   Dg Foot Complete Right  Result Date: 06/22/2018 CLINICAL DATA:  Twisted ankle getting out of bed. Pain and swelling. EXAM: RIGHT ANKLE - COMPLETE 3+ VIEW; RIGHT FOOT COMPLETE - 3+ VIEW COMPARISON:  None. FINDINGS: RIGHT foot: There is no evidence of fracture, dislocation, or joint effusion. Small plantar calcaneal spur. There is no evidence of arthropathy or other focal bone abnormality. Soft tissues are unremarkable. RIGHT ankle: No fracture deformity nor dislocation. The ankle mortise appears congruent and the tibiofibular syndesmosis intact. No destructive bony lesions. Soft tissue planes are non-suspicious. IMPRESSION: Negative. Electronically Signed   By: Elon Alas M.D.   On: 06/22/2018 03:08    Procedures Procedures (including critical care time)  Medications Ordered in ED Medications - No data to display   Initial Impression / Assessment and Plan / ED Course  I have reviewed the triage vital signs and the nursing notes.  Pertinent labs & imaging results that were available during my  care of the patient were reviewed by me and considered in my medical decision making (see chart for details).     Patient presents with injury to right ankle.  DDx includes, fracture, strain, or sprain.  Consultants: none Plain films reveal no fracture or dislocation.  Pt advised to follow up with PCP and/or orthopedics. Patient given cam walker while in ED, conservative therapy such as RICE recommended and discussed.   Patient will be discharged home & is agreeable with above plan. Returns precautions discussed. Pt appears safe for discharge.   Final Clinical Impressions(s) / ED Diagnoses   Final diagnoses:  Sprain of right ankle, unspecified ligament, initial encounter    ED Discharge Orders    None       Montine Circle, PA-C 06/22/18 0325    Orpah Greek, MD 06/22/18 [phone]

## 2018-06-22 NOTE — ED Triage Notes (Signed)
Pt arrived via [facility] stating that when she got up out of bed she felt "tingling" then fell. No LOC denies hitting head, some swelling noted under left eye. Right ankle swollen and not able to bare weight.

## 2018-07-10 ENCOUNTER — Other Ambulatory Visit: Payer: Medicare Other

## 2018-07-10 ENCOUNTER — Ambulatory Visit: Payer: Medicare Other

## 2018-09-14 ENCOUNTER — Other Ambulatory Visit: Payer: Self-pay | Admitting: Family Medicine

## 2018-09-14 DIAGNOSIS — R748 Abnormal levels of other serum enzymes: Secondary | ICD-10-CM

## 2018-09-21 ENCOUNTER — Inpatient Hospital Stay: Admission: RE | Admit: 2018-09-21 | Payer: Medicare Other | Source: Ambulatory Visit

## 2018-09-24 ENCOUNTER — Ambulatory Visit
Admission: RE | Admit: 2018-09-24 | Discharge: 2018-09-24 | Disposition: A | Discharge status: Home / Self Care | Payer: Medicare Other | Source: Ambulatory Visit | Attending: Family Medicine | Admitting: Family Medicine

## 2018-09-24 DIAGNOSIS — R748 Abnormal levels of other serum enzymes: Secondary | ICD-10-CM

## 2018-09-29 ENCOUNTER — Emergency Department (HOSPITAL_COMMUNITY)
Admission: EM | Admit: 2018-09-29 | Discharge: 2018-09-29 | Disposition: A | Discharge status: Home / Self Care | Payer: Medicare Other | Attending: Emergency Medicine | Admitting: Emergency Medicine

## 2018-09-29 ENCOUNTER — Encounter (HOSPITAL_COMMUNITY): Payer: Self-pay

## 2018-09-29 ENCOUNTER — Other Ambulatory Visit: Payer: Self-pay

## 2018-09-29 DIAGNOSIS — F319 Bipolar disorder, unspecified: Secondary | ICD-10-CM | POA: Diagnosis not present

## 2018-09-29 DIAGNOSIS — F17210 Nicotine dependence, cigarettes, uncomplicated: Secondary | ICD-10-CM | POA: Diagnosis not present

## 2018-09-29 DIAGNOSIS — Z85828 Personal history of other malignant neoplasm of skin: Secondary | ICD-10-CM | POA: Diagnosis not present

## 2018-09-29 DIAGNOSIS — R110 Nausea: Secondary | ICD-10-CM | POA: Insufficient documentation

## 2018-09-29 DIAGNOSIS — R234 Changes in skin texture: Secondary | ICD-10-CM | POA: Insufficient documentation

## 2018-09-29 DIAGNOSIS — Z79899 Other long term (current) drug therapy: Secondary | ICD-10-CM | POA: Insufficient documentation

## 2018-09-29 MED ORDER — MUPIROCIN CALCIUM 2 % EX CREA: 1.0000 "application " | TOPICAL_CREAM | Freq: Two times a day (BID) | CUTANEOUS | 0 refills | Status: DC

## 2018-09-29 NOTE — ED Provider Notes (Signed)
[facility] EMERGENCY DEPARTMENT Provider Note   CSN: [id number] Arrival date & time: 09/29/18  0130     History   Chief Complaint Chief Complaint  Patient presents with  . Nausea  . Skin Problem    HPI ANAHIT KLUMB is a 48 y.o. female.  The history is provided by the patient and a relative.  Illness  Location:  Hand and leg, blisters 2 that have scabbed is currently on doxycycline for them Severity:  Mild Onset quality:  Gradual Timing:  Intermittent Progression:  Unchanged Chronicity:  Recurrent Context:  Polypharmacy and sleeps a lot  Relieved by:  Nothing Worsened by:  Nothing Associated symptoms: no abdominal pain, no chest pain, no congestion, no cough, no diarrhea, no ear pain, no fatigue, no fever, no headaches, no loss of consciousness, no myalgias, no nausea, no rash, no rhinorrhea, no shortness of breath, no sore throat, no vomiting and no wheezing   Risk factors:  On a lot of pain meds and psychiatric medications Patient with Bipolar disorder and a pain syndrome presents with scabs on her left thumb left lateral hand and left calf.  She sleeps a lot according to her significant other because of the amount of psychiatric medication and pain medication she is on.  No f/c/r.  No streaking.  No SI or HI no AH of VH.  She is denying any other complaints at this time and states neither the lesions or the amount of medication she is taking are new.  She sees Dr. Darron Doom as her PMD [facility] for psychiatry and Dr. Nancy Fetter who prescribes her percocet.    Past Medical History:  Diagnosis Date  . Anxiety   . Arthritis    left knee  . Astigmatism    both eyes  . Cancer ([place])    melanoma removed from back   . Cataract    left eye  . Depression   . Diverticulosis   . Erosive esophagitis   . Family history of ovarian cancer   . Family history of prostate cancer   . Family history of uterine cancer   . 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 GERD (gastroesophageal reflux disease)   . H/O urinary infection   . Headache   . History of hiatal hernia   . IBS (irritable bowel syndrome)    no current med.  . Jaundice   . MRSA (methicillin resistant Staphylococcus aureus)    hospitalized for 48 hours in Dec 2016  . Staph infection    "in my blood"    Patient Active Problem List   Diagnosis Date Noted  . Inadequate sleep hygiene 04/06/2017  . Snoring 04/06/2017  . Sleep apnea 04/06/2017  . Sleep deprivation 04/06/2017  . Genetic testing 08/24/2016  . Family history of ovarian cancer   . Family history of uterine cancer   . Family history of prostate cancer   . Dysphagia 06/23/2016  . Cellulitis 03/02/2016  . Loss of weight 02/04/2016  . Diarrhea of presumed infectious origin 02/04/2016  . Generalized abdominal pain 02/04/2016  . Internal hemorrhoid, bleeding 04/24/2014  . Irregular menstrual cycle 06/22/2012  . TOBACCO ABUSE 10/19/2009  . SYNCOPE 10/19/2009  . CHEST PAIN 10/19/2009  . SYNCOPE, HX OF 10/19/2009  . CANDIDIASIS OF THE ESOPHAGUS 06/17/2009  . LEG PAIN, LEFT 12/22/2008  . HAIR LOSS 06/03/2008  . HEMORRHOIDS, INTERNAL, WITH BLEEDING 11/01/2007  . VITAMIN B12 DEFICIENCY 09/05/2007  .  DYSPLASTIC NEVUS 08/14/2007  . THROMBOCYTOSIS 07/03/2007  . Diarrhea 07/03/2007  . 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 HYPERCHOLESTEROLEMIA 05/31/2007  . DISORDER, BIPOLAR NEC 05/31/2007  . PSORIASIS 05/31/2007  . INSOMNIA 05/31/2007  . COCAINE ABUSE 05/14/2007  . PERIODONTAL DISEASE 05/14/2007  . Gastritis and gastroduodenitis 05/14/2007  . DIVERTICULOSIS, COLON, HX OF 05/14/2007  . MYALGIA, HX OF 05/14/2007    Past Surgical History:  Procedure Laterality Date  . CHOLECYSTECTOMY  08/02/2012   Procedure: LAPAROSCOPIC CHOLECYSTECTOMY WITH INTRAOPERATIVE CHOLANGIOGRAM;  Surgeon: Rolm Bookbinder, MD;  Location: [facility];  Service: General;  Laterality: N/A;  . COLONOSCOPY WITH PROPOFOL   04/09/2014  . COLONOSCOPY WITH PROPOFOL N/A 07/14/2016   Procedure: COLONOSCOPY WITH PROPOFOL;  Surgeon: Milus Banister, MD;  Location: [facility];  Service: Endoscopy;  Laterality: N/A;  . DILATION AND CURETTAGE OF UTERUS     w/ hysteroscopy to remove cyst  . ESOPHAGOGASTRODUODENOSCOPY N/A 12/02/2017   Procedure: ESOPHAGOGASTRODUODENOSCOPY (EGD);  Surgeon: Carol Ada, MD;  Location: [facility];  Service: Endoscopy;  Laterality: N/A;  . ESOPHAGOGASTRODUODENOSCOPY (EGD) WITH PROPOFOL  04/09/2014  . ESOPHAGOGASTRODUODENOSCOPY (EGD) WITH PROPOFOL N/A 07/14/2016   Procedure: ESOPHAGOGASTRODUODENOSCOPY (EGD) WITH PROPOFOL;  Surgeon: Milus Banister, MD;  Location: [facility];  Service: Endoscopy;  Laterality: N/A;  . HYSTEROSCOPY W/D&C  06/22/2012   Procedure: DILATATION AND CURETTAGE /HYSTEROSCOPY;  Surgeon: Lahoma Crocker, MD;  Location: [facility];  Service: Gynecology;  Laterality: N/A;  . KNEE ARTHROSCOPY  2011   left  . ORIF TOE FRACTURE Left 11/05/2014   Procedure: OPEN REDUCTION INTERNAL FIXATION (ORIF) LEFT FIFTH METATARSAL (TOE) FRACTURE WITH CALCANEAL BONE GRAFT;  Surgeon: Dorna Leitz, MD;  Location: [facility];  Service: Orthopedics;  Laterality: Left;  . WISDOM TOOTH EXTRACTION       OB History   No obstetric history on file.      Home Medications    Prior to Admission medications   Medication Sig Start Date End Date Taking? Authorizing Provider  cholestyramine (QUESTRAN) 4 g packet TAKE 1 PACKET BY MOUTH 2 TIMES DAILY Patient not taking: Reported on 12/03/2017 07/12/17   Milus Banister, MD  citalopram (CELEXA) 40 MG tablet Take 40 mg by mouth daily.  03/12/18   [provider]  clobetasol ointment (TEMOVATE) 8.25 % Apply 1 application topically 2 (two) times daily. Patient not taking: Reported on 12/03/2017 07/19/16   Kandis Cocking A, CNM  clonazePAM (KLONOPIN) 2 MG tablet Take 1 mg by mouth 2 (two) times daily as needed for anxiety.  04/13/18    [provider]  clotrimazole (LOTRIMIN) 1 % cream Apply to affected area 2 times daily Patient not taking: Reported on 12/03/2017 01/26/17   Charlann Lange, PA-C  cyclobenzaprine (FLEXERIL) 5 MG tablet Take 5 mg by mouth at bedtime as needed for muscle spasms.  04/19/18   [provider]  dicyclomine (BENTYL) 10 MG capsule Take 2 capsules (20 mg total) 3 (three) times daily by mouth. 07/04/17 10/02/17  Milus Banister, MD  dicyclomine (BENTYL) 10 MG capsule Take 20 mg by mouth 3 (three) times daily. 04/16/18   [provider]  doxepin (SINEQUAN) 10 MG capsule Take 100 mg by mouth at bedtime.     [provider]  folic acid (FOLVITE) 1 MG tablet Take 1 mg by mouth daily. 03/22/18   [provider]  gabapentin (NEURONTIN) 300 MG capsule Take 300 mg by mouth 3 (three) times daily.  04/10/18   [provider]  HUMIRA PEN 40 MG/0.4ML PNKT Inject 40 mg into the muscle every 14 (fourteen) days.  04/13/18   [provider]  hydrochlorothiazide (MICROZIDE) 12.5 MG capsule Take 12.5 mg by mouth daily. 02/28/18   [provider]  HYDROcodone-acetaminophen (NORCO/VICODIN) 5-325 MG tablet Take 1 tablet by mouth every 6 (six) hours as needed for moderate pain or severe pain.    [provider]  ibuprofen (ADVIL,MOTRIN) 800 MG tablet Take 1 tablet (800 mg total) by mouth 3 (three) times daily. 06/22/18   Montine Circle, PA-C  meloxicam (MOBIC) 7.5 MG tablet Take 7.5 mg by mouth daily as needed for pain.  04/23/18   [provider]  mupirocin cream (BACTROBAN) 2 % Apply 1 application topically 2 (two) times daily. 09/29/18   Shariah Assad, MD  pantoprazole (PROTONIX) 40 MG tablet Take 1 tablet (40 mg total) by mouth daily. Patient not taking: Reported on 12/03/2017 06/22/16   Zehr, Laban Emperor, PA-C  predniSONE (DELTASONE) 5 MG tablet Take 5 mg by mouth as directed.  04/18/18   [provider]  promethazine (PHENERGAN) 25 MG  suppository Place 1 suppository (25 mg total) rectally every 6 (six) hours as needed for nausea or vomiting. Patient not taking: Reported on 12/03/2017 01/26/17   Charlann Lange, PA-C  promethazine (PHENERGAN) 25 MG tablet Take 25 mg by mouth 2 (two) times daily as needed for nausea or vomiting.  04/11/18   [provider]  QUEtiapine (SEROQUEL) 100 MG tablet Take 100 mg by mouth at bedtime.  04/01/18   [provider]  sucralfate (CARAFATE) 1 GM/10ML suspension Take 10 mLs (1 g total) by mouth 4 (four) times daily -  with meals and at bedtime. Patient not taking: Reported on 12/03/2017 01/26/17   Charlann Lange, PA-C  temazepam (RESTORIL) 30 MG capsule Take 30 mg by mouth at bedtime as needed for sleep.  01/20/18   [provider]  topiramate (TOPAMAX) 50 MG tablet Take 50 mg by mouth at bedtime.  04/11/18   [provider]  triamcinolone ointment (KENALOG) 0.1 % Apply 1 application topically 4 (four) times daily. Patient not taking: Reported on 12/03/2017 06/14/16   Morene Crocker, CNM    Family History Family History  Problem Relation Age of Onset  . Heart disease Father   . Stroke Father   . Heart attack Father   . Skin cancer Father   . Diabetes Father   . Uterine cancer Mother 50  . COPD Mother   . Cancer - Colon Maternal Grandfather        mets to bone  . Stomach cancer Paternal Grandmother   . Cervical cancer Sister        dx between 29-33  . Migraines Sister   . Prostate cancer Maternal Uncle 59       mets to bone  . Ovarian cancer Paternal Aunt   . Heart attack Paternal Grandfather   . Skin cancer Paternal Aunt     Social History Social History   Tobacco Use  . Smoking status: Current Every Day Smoker    Packs/day: 1.00    Years: 33.00    Pack years: 33.00    Types: Cigarettes  . Smokeless tobacco: Never Used  . Tobacco comment: form given 02-04-16   Substance Use Topics  . Alcohol use: No    Alcohol/week: 0.0 standard drinks  . Drug  use: No    Types: Marijuana    Comment: history of use  Allergies   Codeine   Review of Systems Review of Systems  Constitutional: Negative for fatigue and fever.  HENT: Negative for congestion, ear pain, rhinorrhea and sore throat.   Respiratory: Negative for cough, shortness of breath and wheezing.   Cardiovascular: Negative for chest pain.  Gastrointestinal: Negative for abdominal pain, diarrhea, nausea and vomiting.  Musculoskeletal: Negative for myalgias.  Skin: Negative for rash.  Neurological: Negative for dizziness, loss of consciousness, facial asymmetry and headaches.  Psychiatric/Behavioral: Negative for confusion, dysphoric mood, hallucinations, self-injury and suicidal ideas. The patient is not nervous/anxious and is not hyperactive.   All other systems reviewed and are negative.    Physical Exam Updated Vital Signs BP 96/64   Pulse 66   Temp (!) 97.5 F (36.4 C) (Oral)   Resp 18   LMP 06/14/2012   SpO2 90%   Physical Exam Vitals signs and nursing note reviewed.  Constitutional:      Appearance: She is obese. She is not ill-appearing.     Comments: Asleep but arouses to verbal stimuli  HENT:     Head: Normocephalic and atraumatic.     Nose: Nose normal.     Mouth/Throat:     Mouth: Mucous membranes are moist.     Pharynx: Oropharynx is clear.  Eyes:     Conjunctiva/sclera: Conjunctivae normal.     Pupils: Pupils are equal, round, and reactive to light.  Neck:     Musculoskeletal: Normal range of motion and neck supple.  Cardiovascular:     Rate and Rhythm: Normal rate and regular rhythm.     Pulses: Normal pulses.     Heart sounds: Normal heart sounds.  Pulmonary:     Effort: Pulmonary effort is normal.     Breath sounds: Normal breath sounds.  Abdominal:     General: Abdomen is flat. Bowel sounds are normal.     Tenderness: There is no abdominal tenderness. There is no guarding.  Musculoskeletal: Normal range of motion.  Skin:     General: Skin is warm and dry.     Capillary Refill: Capillary refill takes less than 2 seconds.  Neurological:     General: No focal deficit present.     Mental Status: She is oriented to person, place, and time.  Psychiatric:        Mood and Affect: Mood is not depressed. Affect is not labile, flat or tearful.        Speech: She is communicative. Speech is not rapid and pressured.        Behavior: Behavior is not agitated.        Thought Content: Thought content normal. Thought content does not include homicidal or suicidal ideation.      ED Treatments / Results  Labs (all labs ordered are listed, but only abnormal results are displayed) Labs Reviewed - No data to display  EKG None  Radiology No results found.  Procedures Procedures (including critical care time)  Medications Ordered in ED Medications - No data to display   Wound care provided to each scab and sterile dressing applied, use original dial soap in a pump to clean lesions twice daily and then apply bactroban. Continue your doxycycline as prescribed.  Patient and significant other verbalize understanding agree to follow up.    Final Clinical Impressions(s) / ED Diagnoses   Final diagnoses:  Scab  Polypharmacy   It is very unwise for a patient with a substance abuse history to be on this many addictive medications.  I have given resources for detox program and patient is instructed to call her psychiatrist about tapering off some medications as she is clearly overmedicated.  Her overdose score is 770 per the [place] and she is getting narcotics and multiple different benzos from multi-ple providers and I have advised stopping the percocet.  She does not need both temazepam and seroquel in addition to klonopin. There are no indications for psychiatric admission at this time.     Return for pain, intractable cough, productive cough,fevers >100.4 unrelieved by medication, shortness of breath, intractable  vomiting, or diarrhea, abdominal pain, passing out,Inability to tolerate liquids or food, cough, altered mental status or any concerns. No signs of systemic illness or infection. The patient is nontoxic-appearing on exam and vital signs are within normal limits.   I have reviewed the triage vital signs and the nursing notes. Pertinent labs &imaging results that were available during my care of the patient were reviewed by me and considered in my medical decision making (see chart for details).  After history, exam, and medical workup I feel the patient has been appropriately medically screened and is safe for discharge home. Pertinent diagnoses were discussed with the patient. Patient was given return precautions. ED Discharge Orders         Ordered    mupirocin cream (BACTROBAN) 2 %  2 times daily     09/29/18 0316           Graelyn Bihl, MD 09/29/18 [phone]

## 2018-09-29 NOTE — ED Triage Notes (Signed)
Pt here with blisters to her hands and left leg for the last week.  Pt Also saying she feels nauseated and cannot keep food down. Hx of rheumatoid arthritis and seen at pain clinic for such.  Started diflucan a week ago when blisters began shortly after.

## 2018-09-29 NOTE — ED Notes (Signed)
Patient verbalizes understanding of discharge instructions. Opportunity for questioning and answers were provided. Armband removed by staff, pt discharged from ED ambulatory.   

## 2019-04-07 IMAGING — CR DG FOOT COMPLETE 3+V*R*
4 series · 4 of 4 positions shown · non-contrast
Comparison: None.

CLINICAL DATA: Twisted ankle getting out of bed. Pain and swelling.

EXAM:
RIGHT ANKLE - COMPLETE 3+ VIEW; RIGHT FOOT COMPLETE - 3+ VIEW

[x foot lat right]
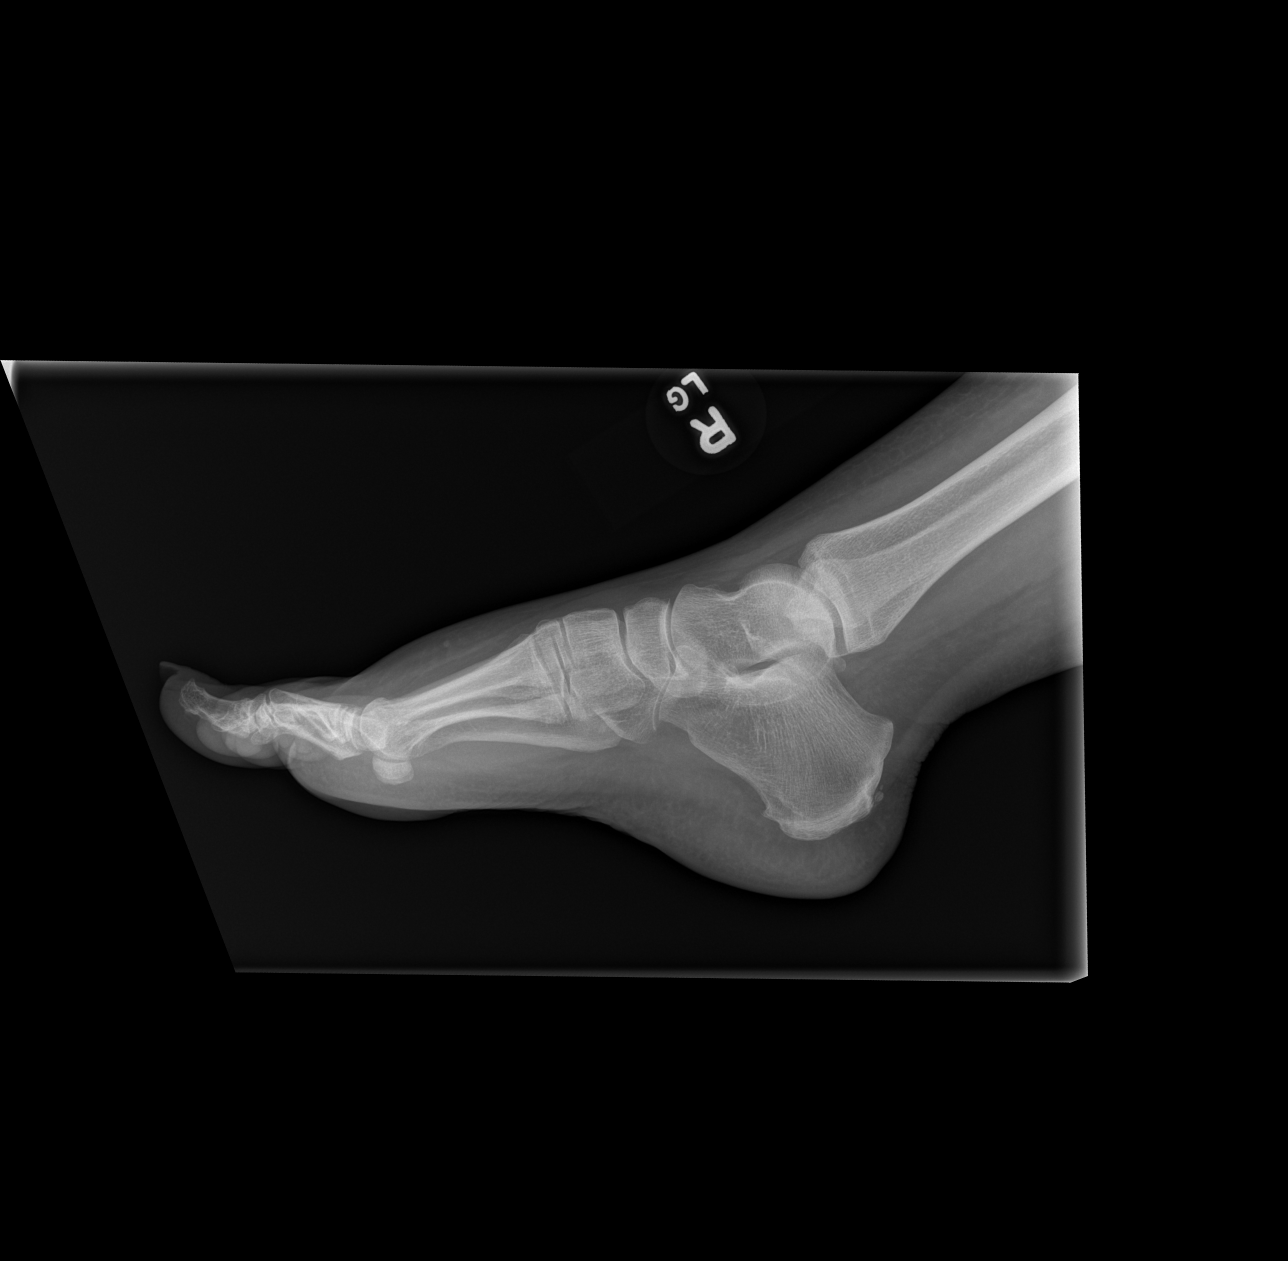

[x foot ap right]
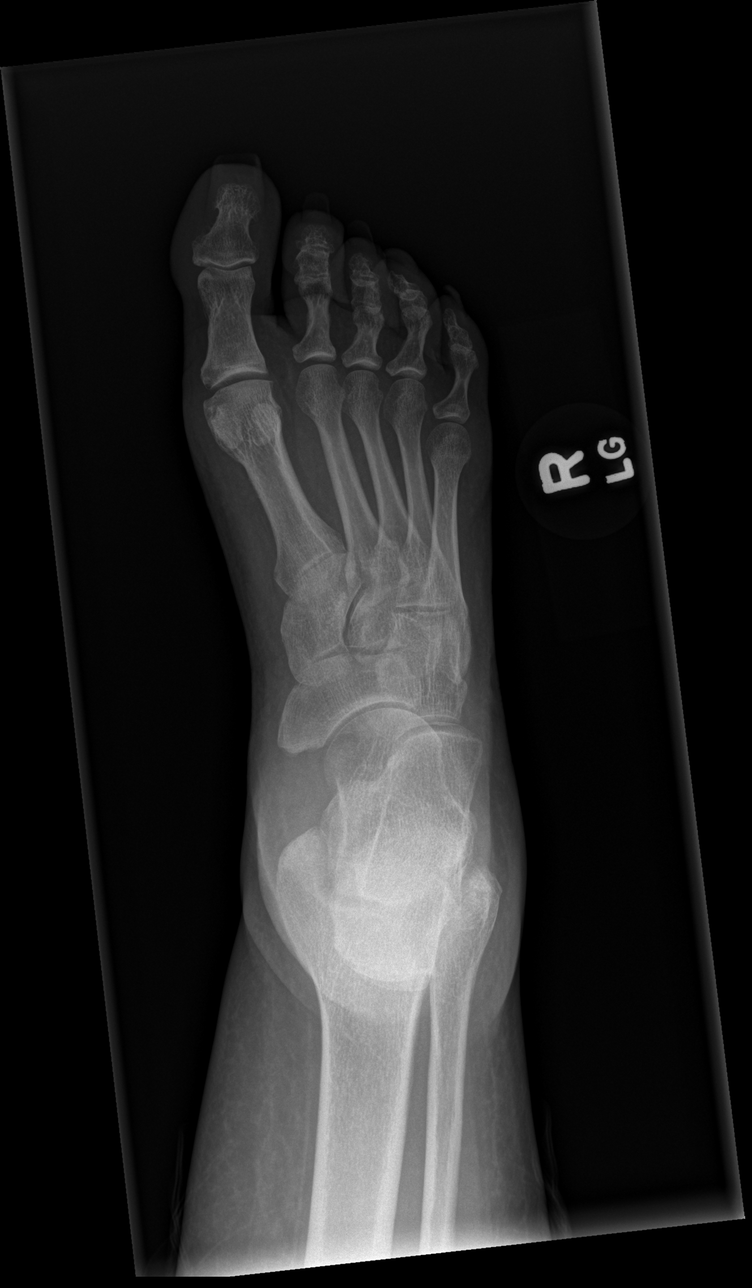

[x foot obl right]
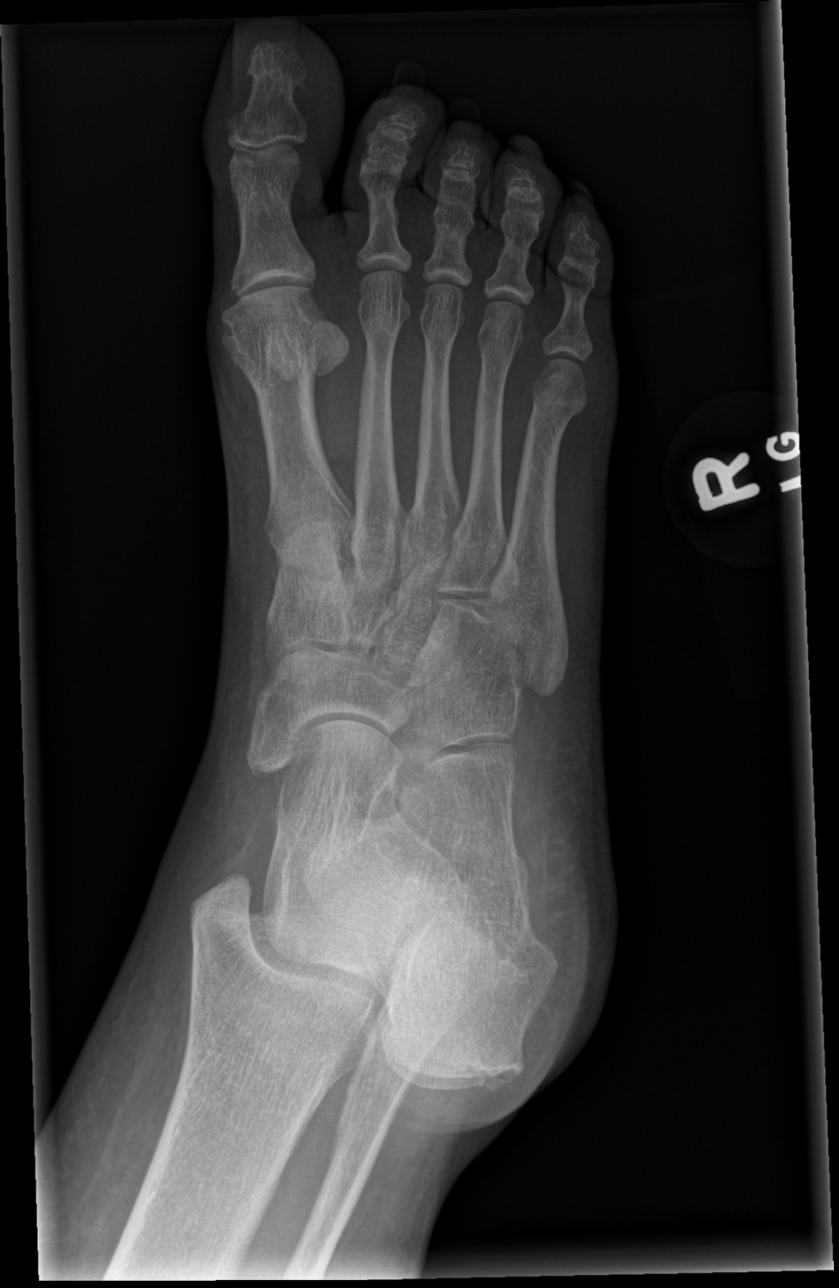

[x foot right 0-3yrs]
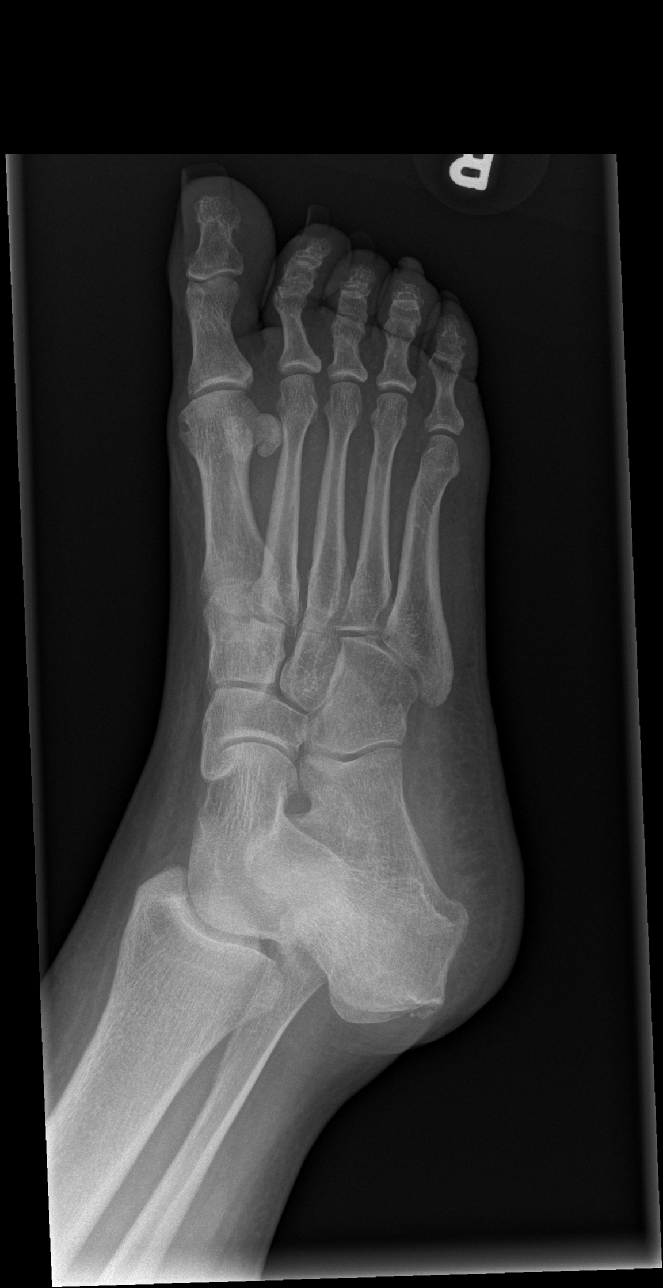

[4 of 4 positions shown; findings below may reference images not displayed]

FINDINGS: RIGHT foot: There is no evidence of fracture, dislocation, or joint
effusion. Small plantar calcaneal spur. There is no evidence of
arthropathy or other focal bone abnormality. Soft tissues are
unremarkable.

RIGHT ankle: No fracture deformity nor dislocation. The ankle
mortise appears congruent and the tibiofibular syndesmosis intact.
No destructive bony lesions. Soft tissue planes are non-suspicious.
IMPRESSION: Negative.

## 2019-05-10 ENCOUNTER — Other Ambulatory Visit: Payer: Self-pay

## 2019-05-10 DIAGNOSIS — Z20822 Contact with and (suspected) exposure to covid-19: Secondary | ICD-10-CM

## 2019-05-12 LAB — NOVEL CORONAVIRUS, NAA: SARS-CoV-2, NAA: NOT DETECTED

## 2019-10-15 ENCOUNTER — Other Ambulatory Visit: Payer: Self-pay | Admitting: Internal Medicine

## 2019-10-15 DIAGNOSIS — Z1231 Encounter for screening mammogram for malignant neoplasm of breast: Secondary | ICD-10-CM

## 2019-11-20 ENCOUNTER — Ambulatory Visit: Payer: Medicare Other

## 2019-12-06 ENCOUNTER — Inpatient Hospital Stay: Admission: RE | Admit: 2019-12-06 | Payer: Medicare Other | Source: Ambulatory Visit

## 2020-02-24 ENCOUNTER — Emergency Department (HOSPITAL_COMMUNITY)
Admission: EM | Admit: 2020-02-24 | Discharge: 2020-02-24 | Disposition: A | Discharge status: Home / Self Care | Payer: Medicare Other | Attending: Emergency Medicine | Admitting: Emergency Medicine

## 2020-02-24 ENCOUNTER — Other Ambulatory Visit: Payer: Self-pay

## 2020-02-24 DIAGNOSIS — Z85828 Personal history of other malignant neoplasm of skin: Secondary | ICD-10-CM | POA: Diagnosis not present

## 2020-02-24 DIAGNOSIS — R400 Somnolence: Secondary | ICD-10-CM | POA: Diagnosis present

## 2020-02-24 DIAGNOSIS — F558 Abuse of other non-psychoactive substances: Secondary | ICD-10-CM | POA: Insufficient documentation

## 2020-02-24 DIAGNOSIS — F17210 Nicotine dependence, cigarettes, uncomplicated: Secondary | ICD-10-CM | POA: Diagnosis not present

## 2020-02-24 DIAGNOSIS — F1990 Other psychoactive substance use, unspecified, uncomplicated: Secondary | ICD-10-CM

## 2020-02-24 DIAGNOSIS — Z79899 Other long term (current) drug therapy: Secondary | ICD-10-CM | POA: Insufficient documentation

## 2020-02-24 LAB — COMPREHENSIVE METABOLIC PANEL
ALT: 43 U/L (ref 0–44)
AST: 37 U/L (ref 15–41)
Albumin: 3.6 g/dL (ref 3.5–5.0)
Alkaline Phosphatase: 100 U/L (ref 38–126)
Anion gap: 7 (ref 5–15)
BUN: 8 mg/dL (ref 6–20)
CO2: 24 mmol/L (ref 22–32)
Calcium: 8.7 mg/dL — ABNORMAL LOW (ref 8.9–10.3)
Chloride: 108 mmol/L (ref 98–111)
Creatinine, Ser: 1.28 mg/dL — ABNORMAL HIGH (ref 0.44–1.00)
GFR calc Af Amer: 57 mL/min — ABNORMAL LOW (ref >60)
GFR calc non Af Amer: 49 mL/min — ABNORMAL LOW (ref >60)
Glucose, Bld: 94 mg/dL (ref 70–99)
Potassium: 4.1 mmol/L (ref 3.5–5.1)
Sodium: 139 mmol/L (ref 135–145)
Total Bilirubin: 0.3 mg/dL (ref 0.3–1.2)
Total Protein: 7 g/dL (ref 6.5–8.1)

## 2020-02-24 LAB — RAPID URINE DRUG SCREEN, HOSP PERFORMED
Amphetamines: NOT DETECTED
Barbiturates: NOT DETECTED
Benzodiazepines: POSITIVE — AB
Cocaine: NOT DETECTED
Opiates: POSITIVE — AB
Tetrahydrocannabinol: POSITIVE — AB

## 2020-02-24 LAB — ACETAMINOPHEN LEVEL: Acetaminophen (Tylenol), Serum: <10 ug/mL — ABNORMAL LOW (ref 10–30)

## 2020-02-24 LAB — CBC
HCT: 41.8 % (ref 36.0–46.0)
Hemoglobin: 13.1 g/dL (ref 12.0–15.0)
MCH: 31 pg (ref 26.0–34.0)
MCHC: 31.3 g/dL (ref 30.0–36.0)
MCV: 99.1 fL (ref 80.0–100.0)
Platelets: 311 10*3/uL (ref 150–400)
RBC: 4.22 MIL/uL (ref 3.87–5.11)
RDW: 13.5 % (ref 11.5–15.5)
WBC: 7.8 10*3/uL (ref 4.0–10.5)
nRBC: 0 % (ref 0.0–0.2)

## 2020-02-24 LAB — I-STAT BETA HCG BLOOD, ED (MC, WL, AP ONLY): I-stat hCG, quantitative: <5 m[IU]/mL (ref <5)

## 2020-02-24 LAB — ETHANOL: Alcohol, Ethyl (B): <10 mg/dL (ref <10)

## 2020-02-24 LAB — SALICYLATE LEVEL: Salicylate Lvl: <7 mg/dL — ABNORMAL LOW (ref 7.0–30.0)

## 2020-02-24 MED ORDER — SODIUM CHLORIDE 0.9 % IV SOLN
1000.0000 mL | INTRAVENOUS | Status: DC
  Administered 2020-02-24: 1000 mL via INTRAVENOUS

## 2020-02-24 MED ORDER — SODIUM CHLORIDE 0.9 % IV BOLUS (SEPSIS)
1000.0000 mL | Freq: Once | INTRAVENOUS | Status: AC
  Administered 2020-02-24: 1000 mL via INTRAVENOUS

## 2020-02-24 NOTE — ED Notes (Signed)
Medications are as follows with Date Filled, # of pills in prescription, and # of pills left in bottle respectfully:  Oxcarbazepine 150mg : Filled 02/08/20 30 pills: 0 pills in bottle Furosemmide 40mg : Filled 10/22/19 20 pills: 20 in bottle  Propanolol 10mg : Filled 01/30/20 60 pills: 7 in bottle Gabapentin 400mg : Filled 02/03/20 90 capsules: 1 capsule in bottle  Tramadol 50mg  Tablet: Filled 02/12/20  120pills: 28 in bottle  Hydrcodone-Acetamin  5-325: Filled 02/19/20  12 pills: 0 pills in bottle  Sulfamethoxazole-temp DS: Filled 02/19/20 14 tablets: 0 in bottle Topiramate 100mg : Filled 12/16/19 90 tablets: 18 in bottle Zolpidem Tartrate 10mg : Filled 9/37/90   30tablets:  0 in bottle Clonazepam 1mg  tablet: Filled 02/08/20    60 tablets: 2 in bottle Torsemide 20mg : Filled 02/12/20    28 tablets: 27 in bottle  Clonidine Hcl 0.1mg  Tablet: Filled 12/18/19 180 tablets: 53 in bottle Promethazine 25mg  tablets: Filled 02/21/20   28 tablets: 17 tablets in bottle Citalopram HBR 40mg  tablet: Filled 10/31/19   90 tablets: 33 tablets in bottle Cyclobenzaprine 10mg  tablet: Filled 02/21/20 90 tablets: 77 tablets in bottle

## 2020-02-24 NOTE — ED Provider Notes (Signed)
Signout from Dr. Leonette Monarch.  49 year old female here with somnolence, likely related to overuse of sedative medication.  Plan is to observe until more alert and reassess. Physical Exam  BP 137/87   Pulse 71   Temp 98 F (36.7 C) (Oral)   Resp 11   LMP 06/14/2012   SpO2 100%   Physical Exam  ED Course/Procedures     Procedures  MDM  Patient now awake and alert at 12 PM.  She states she was tired due to stressors at home and having taken her regular sleeping medication.  Denies any overdose or attempt to hurt herself.  Denies that she is suicidal.       Hayden Rasmussen, MD 02/24/20 1701

## 2020-02-24 NOTE — ED Notes (Signed)
Patient has 1 belongings bag placed in 9-12 cabinet at nurses station containing shirt, slippers, black purse, and flower bag with medications in it.

## 2020-02-24 NOTE — ED Notes (Signed)
Patient speaking with her boyfriend who is coming to pick her up.

## 2020-02-24 NOTE — ED Notes (Signed)
Pure wick placed on patient. Patient encouraged to void.

## 2020-02-24 NOTE — ED Notes (Signed)
Patient's significant other reports she has been sitting on the couch taking multiple medications, falling asleep and then waking up a few hours later and repeating the cycle.  Patient claims significant other is a "very good liar" and that he "takes her medication" Patient reports she is not "overtaking her pills but is sharing them" patient falling asleep during conversation

## 2020-02-24 NOTE — ED Provider Notes (Signed)
[facility] DEPT Provider Note  CSN: [id number] Arrival date & time: 02/24/20 0307  Chief Complaint(s) Drug Overdose ED Triage Notes Lane Hacker, RN (Registered Nurse) . [name] . [name] Date of Service: 02/24/2020 3:14 AM . . Signed   Per EMS: Patient reports to ED via EMS. Patient is somnolent and reports she doesn't know why she is here. Patient has a pull string bag full of medications that were filled within the last 2 weeks and multiple pill bottles are empty. Patient denies taking anything other than "1 clonidine, 1 clonazepam, and 1 ativan"      HPI [name] is a 49 y.o. female here somnolence in the setting of likely Rx med overuse.   Remainder of history, ROS, and physical exam limited due to patient's condition (AMS). Additional information was obtained from EMS.   Level V Caveat.    HPI  Past Medical History Past Medical History:  Diagnosis Date  . Anxiety   . Arthritis    left knee  . Astigmatism    both eyes  . Cancer ([place])    melanoma removed from back   . Cataract    left eye  . Depression   . Diverticulosis   . Erosive esophagitis   . Family history of ovarian cancer   . Family history of prostate cancer   . Family history of uterine cancer   . 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 GERD (gastroesophageal reflux disease)   . H/O urinary infection   . Headache   . History of hiatal hernia   . IBS (irritable bowel syndrome)    no current med.  . Jaundice   . MRSA (methicillin resistant Staphylococcus aureus)    hospitalized for 48 hours in Dec 2016  . Staph infection    "in my blood"   Patient Active Problem List   Diagnosis Date Noted  . Inadequate sleep hygiene 04/06/2017  . Snoring 04/06/2017  . Sleep apnea 04/06/2017  . Sleep deprivation 04/06/2017  . Genetic testing 08/24/2016  . Family history of ovarian cancer   . Family history of uterine  cancer   . Family history of prostate cancer   . Dysphagia 06/23/2016  . Cellulitis 03/02/2016  . Loss of weight 02/04/2016  . Diarrhea of presumed infectious origin 02/04/2016  . Generalized abdominal pain 02/04/2016  . Internal hemorrhoid, bleeding 04/24/2014  . Irregular menstrual cycle 06/22/2012  . TOBACCO ABUSE 10/19/2009  . SYNCOPE 10/19/2009  . CHEST PAIN 10/19/2009  . SYNCOPE, HX OF 10/19/2009  . CANDIDIASIS OF THE ESOPHAGUS 06/17/2009  . LEG PAIN, LEFT 12/22/2008  . HAIR LOSS 06/03/2008  . HEMORRHOIDS, INTERNAL, WITH BLEEDING 11/01/2007  . VITAMIN B12 DEFICIENCY 09/05/2007  . DYSPLASTIC NEVUS 08/14/2007  . THROMBOCYTOSIS 07/03/2007  . Diarrhea 07/03/2007  . 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 HYPERCHOLESTEROLEMIA 05/31/2007  . DISORDER, BIPOLAR NEC 05/31/2007  . PSORIASIS 05/31/2007  . INSOMNIA 05/31/2007  . COCAINE ABUSE 05/14/2007  . PERIODONTAL DISEASE 05/14/2007  . Gastritis and gastroduodenitis 05/14/2007  . DIVERTICULOSIS, COLON, HX OF 05/14/2007  . MYALGIA, HX OF 05/14/2007   Home Medication(s) Prior to Admission medications   Medication Sig Start Date End Date Taking? Authorizing Provider  clonazePAM (KLONOPIN) 1 MG tablet Take 1 mg by mouth 2 (two) times daily as needed for anxiety.   Yes [provider]  cloNIDine (CATAPRES) 0.1 MG tablet Take 0.1 mg by mouth 2 (  two) times daily as needed. High Blood Pressure   Yes [provider]  cyclobenzaprine (FLEXERIL) 10 MG tablet Take 10 mg by mouth 3 (three) times daily as needed for muscle spasms.   Yes [provider]  furosemide (LASIX) 40 MG tablet Take 40 mg by mouth 2 (two) times daily.   Yes [provider]  gabapentin (NEURONTIN) 400 MG capsule Take 400 mg by mouth 3 (three) times daily.   Yes [provider]  OXcarbazepine (TRILEPTAL) 150 MG tablet Take 300 mg by mouth 2 (two) times daily.   Yes [provider]  propranolol (INDERAL) 10 MG tablet Take  10 mg by mouth 2 (two) times daily.   Yes [provider]  QUEtiapine (SEROQUEL) 300 MG tablet Take 300 mg by mouth at bedtime.   Yes [provider]  sulfamethoxazole-trimethoprim (BACTRIM DS) 800-160 MG tablet Take 1 tablet by mouth 2 (two) times daily. 02/19/20 02/26/20 Yes [provider]  topiramate (TOPAMAX) 100 MG tablet Take 100 mg by mouth at bedtime.   Yes [provider]  torsemide (DEMADEX) 20 MG tablet Take 20 mg by mouth daily as needed (fluid).   Yes [provider]  traMADol (ULTRAM) 50 MG tablet Take 50 mg by mouth every 6 (six) hours as needed.   Yes [provider]  zolpidem (AMBIEN) 10 MG tablet Take 10 mg by mouth at bedtime.   Yes [provider]  cholestyramine (QUESTRAN) 4 g packet TAKE 1 PACKET BY MOUTH 2 TIMES DAILY Patient not taking: Reported on 12/03/2017 07/12/17   Milus Banister, MD  citalopram (CELEXA) 40 MG tablet Take 40 mg by mouth daily.  03/12/18   [provider]  clobetasol ointment (TEMOVATE) 1.61 % Apply 1 application topically 2 (two) times daily. Patient not taking: Reported on 12/03/2017 07/19/16   Kandis Cocking A, CNM  clotrimazole (LOTRIMIN) 1 % cream Apply to affected area 2 times daily Patient not taking: Reported on 12/03/2017 01/26/17   Charlann Lange, PA-C  dicyclomine (BENTYL) 10 MG capsule Take 2 capsules (20 mg total) 3 (three) times daily by mouth. 07/04/17 10/02/17  Milus Banister, MD  dicyclomine (BENTYL) 10 MG capsule Take 20 mg by mouth 3 (three) times daily. 04/16/18   [provider]  doxepin (SINEQUAN) 10 MG capsule Take 100 mg by mouth at bedtime.     [provider]  folic acid (FOLVITE) 1 MG tablet Take 1 mg by mouth daily. 03/22/18   [provider]  HUMIRA PEN 40 MG/0.4ML PNKT Inject 40 mg into the muscle every 14 (fourteen) days.  04/13/18   [provider]  hydrochlorothiazide (MICROZIDE) 12.5 MG capsule Take 12.5 mg by mouth daily.  02/28/18   [provider]  HYDROcodone-acetaminophen (NORCO/VICODIN) 5-325 MG tablet Take 1 tablet by mouth every 6 (six) hours as needed for moderate pain or severe pain.    [provider]  ibuprofen (ADVIL,MOTRIN) 800 MG tablet Take 1 tablet (800 mg total) by mouth 3 (three) times daily. 06/22/18   Montine Circle, PA-C  meloxicam (MOBIC) 7.5 MG tablet Take 7.5 mg by mouth daily as needed for pain.  04/23/18   [provider]  mupirocin cream (BACTROBAN) 2 % Apply 1 application topically 2 (two) times daily. 09/29/18   Palumbo, April, MD  pantoprazole (PROTONIX) 40 MG tablet Take 1 tablet (40 mg total) by mouth daily. Patient not taking: Reported on 12/03/2017 06/22/16   Zehr, Janett Billow D, PA-C  predniSONE (DELTASONE)  5 MG tablet Take 5 mg by mouth as directed.  04/18/18   [provider]  promethazine (PHENERGAN) 25 MG suppository Place 1 suppository (25 mg total) rectally every 6 (six) hours as needed for nausea or vomiting. Patient not taking: Reported on 12/03/2017 01/26/17   Charlann Lange, PA-C  promethazine (PHENERGAN) 25 MG tablet Take 25 mg by mouth 2 (two) times daily as needed for nausea or vomiting.  04/11/18   [provider]  sucralfate (CARAFATE) 1 GM/10ML suspension Take 10 mLs (1 g total) by mouth 4 (four) times daily -  with meals and at bedtime. Patient not taking: Reported on 12/03/2017 01/26/17   Charlann Lange, PA-C  temazepam (RESTORIL) 30 MG capsule Take 30 mg by mouth at bedtime as needed for sleep.  01/20/18   [provider]  triamcinolone ointment (KENALOG) 0.1 % Apply 1 application topically 4 (four) times daily. Patient not taking: Reported on 12/03/2017 06/14/16   Morene Crocker, CNM                                                                                                                                    Past Surgical History Past Surgical History:  Procedure Laterality Date  . CHOLECYSTECTOMY  08/02/2012    Procedure: LAPAROSCOPIC CHOLECYSTECTOMY WITH INTRAOPERATIVE CHOLANGIOGRAM;  Surgeon: Rolm Bookbinder, MD;  Location: [facility];  Service: General;  Laterality: N/A;  . COLONOSCOPY WITH PROPOFOL  04/09/2014  . COLONOSCOPY WITH PROPOFOL N/A 07/14/2016   Procedure: COLONOSCOPY WITH PROPOFOL;  Surgeon: Milus Banister, MD;  Location: [facility];  Service: Endoscopy;  Laterality: N/A;  . DILATION AND CURETTAGE OF UTERUS     w/ hysteroscopy to remove cyst  . ESOPHAGOGASTRODUODENOSCOPY N/A 12/02/2017   Procedure: ESOPHAGOGASTRODUODENOSCOPY (EGD);  Surgeon: Carol Ada, MD;  Location: [facility];  Service: Endoscopy;  Laterality: N/A;  . ESOPHAGOGASTRODUODENOSCOPY (EGD) WITH PROPOFOL  04/09/2014  . ESOPHAGOGASTRODUODENOSCOPY (EGD) WITH PROPOFOL N/A 07/14/2016   Procedure: ESOPHAGOGASTRODUODENOSCOPY (EGD) WITH PROPOFOL;  Surgeon: Milus Banister, MD;  Location: [facility];  Service: Endoscopy;  Laterality: N/A;  . HYSTEROSCOPY WITH D & C  06/22/2012   Procedure: DILATATION AND CURETTAGE /HYSTEROSCOPY;  Surgeon: Lahoma Crocker, MD;  Location: [facility];  Service: Gynecology;  Laterality: N/A;  . KNEE ARTHROSCOPY  2011   left  . ORIF TOE FRACTURE Left 11/05/2014   Procedure: OPEN REDUCTION INTERNAL FIXATION (ORIF) LEFT FIFTH METATARSAL (TOE) FRACTURE WITH CALCANEAL BONE GRAFT;  Surgeon: Dorna Leitz, MD;  Location: [facility];  Service: Orthopedics;  Laterality: Left;  . WISDOM TOOTH EXTRACTION     Family History Family History  Problem Relation Age of Onset  . Heart disease Father   . Stroke Father   . Heart attack Father   . Skin cancer Father   . Diabetes Father   . Uterine cancer Mother 68  . COPD Mother   . Cancer - Colon Maternal Grandfather  mets to bone  . Stomach cancer Paternal Grandmother   . Cervical cancer Sister        dx between 44-33  . Migraines Sister   . Prostate cancer Maternal Uncle 59       mets to bone  . Ovarian cancer Paternal Aunt   . Heart  attack Paternal Grandfather   . Skin cancer Paternal Aunt     Social History Social History   Tobacco Use  . Smoking status: Current Every Day Smoker    Packs/day: 1.00    Years: 33.00    Pack years: 33.00    Types: Cigarettes  . Smokeless tobacco: Never Used  . Tobacco comment: form given 02-04-16   Substance Use Topics  . Alcohol use: No    Alcohol/week: 0.0 standard drinks  . Drug use: No    Types: Marijuana    Comment: history of use   Allergies Codeine  Review of Systems Review of Systems  Unable to perform ROS: Mental status change    Physical Exam Vital Signs  I have reviewed the triage vital signs BP (!) 129/95   Pulse 71   Temp 98 F (36.7 C) (Oral)   Resp 10   LMP 06/14/2012   SpO2 98%   Physical Exam Vitals reviewed.  Constitutional:      General: She is not in acute distress.    Appearance: She is well-developed. She is not diaphoretic.  HENT:     Head: Normocephalic and atraumatic.     Nose: Nose normal.  Eyes:     General: No scleral icterus.       Right eye: No discharge.        Left eye: No discharge.     Conjunctiva/sclera: Conjunctivae normal.     Pupils: Pupils are equal, round, and reactive to light.  Cardiovascular:     Rate and Rhythm: Normal rate and regular rhythm.     Heart sounds: No murmur heard.  No friction rub. No gallop.   Pulmonary:     Effort: Pulmonary effort is normal. No respiratory distress.     Breath sounds: Normal breath sounds. No stridor. No rales.  Abdominal:     General: There is no distension.     Palpations: Abdomen is soft.     Tenderness: There is no abdominal tenderness.  Musculoskeletal:        General: No tenderness.     Cervical back: Normal range of motion and neck supple.  Skin:    General: Skin is warm and dry.     Findings: No erythema or rash.  Neurological:     Mental Status: She is oriented to person, place, and time.     Comments: Somnolent Will awaken to gentle stimuli, but had  slurred speech when speaking.       ED Results and Treatments Labs (all labs ordered are listed, but only abnormal results are displayed) Labs Reviewed  COMPREHENSIVE METABOLIC PANEL - Abnormal; Notable for the following components:      Result Value   Creatinine, Ser 1.28 (*)    Calcium 8.7 (*)    GFR calc non Af Amer 49 (*)    GFR calc Af Amer 57 (*)    All other components within normal limits  SALICYLATE LEVEL - Abnormal; Notable for the following components:   Salicylate Lvl <8.3 (*)    All other components within normal limits  ACETAMINOPHEN LEVEL - Abnormal; Notable for the following components:   Acetaminophen (  Tylenol), Serum <10 (*)    All other components within normal limits  ETHANOL  CBC  RAPID URINE DRUG SCREEN, HOSP PERFORMED  I-STAT BETA HCG BLOOD, ED ([facility], [facility], [facility] ONLY)                                                                                                                         EKG  EKG Interpretation  Date/Time:  Monday February 24 2020 03:36:32 EDT Ventricular Rate:  68 PR Interval:    QRS Duration: 89 QT Interval:  435 QTC Calculation: 463 R Axis:   79 Text Interpretation: Sinus rhythm No acute changes Confirmed by Addison Lank [phone]) on 02/24/2020 3:51:29 AM      Radiology No results found.  Pertinent labs & imaging results that were available during my care of the patient were reviewed by me and considered in my medical decision making (see chart for details).  Medications Ordered in ED Medications  sodium chloride 0.9 % bolus 1,000 mL (0 mLs Intravenous Stopped 02/24/20 0627)    Followed by  0.9 %  sodium chloride infusion (1,000 mLs Intravenous New Bag/Given 02/24/20 4431)                                                                                                                                    Procedures Procedures  (including critical care time)  Medical Decision Making / ED Course I have reviewed the nursing notes for  this encounter and the patient's prior records (if available in EHR or on provided paperwork).   [name] was evaluated in Emergency Department on 02/24/2020 for the symptoms described in the history of present illness. She was evaluated in the context of the global COVID-19 pandemic, which necessitated consideration that the patient might be at risk for infection with the SARS-CoV-2 virus that causes COVID-19. Institutional protocols and algorithms that pertain to the evaluation of patients at risk for COVID-19 are in a state of rapid change based on information released by regulatory bodies including the CDC and federal and state organizations. These policies and algorithms were followed during the patient's care in the ED.  Somnolence from Rx med overuse over a prolonged period of time. Screening labs reassuring other than mild AKI. No interval changes on EKG.  Will IV hydrate and allow her to MFT.  Patient care turned over to Dr Melina Copa. Patient case and results  discussed in detail; please see their note for further ED managment.          Final Clinical Impression(s) / ED Diagnoses Final diagnoses:  Misuse of medication  Somnolence      This chart was dictated using voice recognition software.  Despite best efforts to proofread,  errors can occur which can change the documentation meaning.   Fatima Blank, MD 02/24/20 [phone]

## 2020-02-24 NOTE — ED Notes (Signed)
Patient pulled out IV.   New IV placed. Patient verbalized not to pull IV out.

## 2020-02-24 NOTE — ED Notes (Signed)
Patient visitor would like updates when available: Sela Hilding  [phone]

## 2020-02-24 NOTE — ED Triage Notes (Signed)
Per EMS: Patient reports to ED via EMS. Patient is somnolent and reports she doesn't know why she is here. Patient has a pull string bag full of medications that were filled within the last 2 weeks and multiple pill bottles are empty. Patient denies taking anything other than "1 clonidine, 1 clonazepam, and 1 ativan"

## 2020-02-24 NOTE — ED Notes (Signed)
Patient given meal tray. Patient feeding self.

## 2020-02-24 NOTE — ED Notes (Signed)
Pure wick has been placed. Suction set to 45mmHg.  

## 2020-04-08 ENCOUNTER — Encounter (HOSPITAL_COMMUNITY): Payer: Self-pay | Admitting: Registered Nurse

## 2020-04-08 ENCOUNTER — Ambulatory Visit (HOSPITAL_COMMUNITY)
Admission: AD | Admit: 2020-04-08 | Discharge: 2020-04-08 | Disposition: A | Discharge status: Other Acute Inpatient Hospital | Payer: Medicare Other | Source: Intra-hospital | Attending: Registered Nurse | Admitting: Registered Nurse

## 2020-04-08 ENCOUNTER — Other Ambulatory Visit: Payer: Self-pay

## 2020-04-08 DIAGNOSIS — R443 Hallucinations, unspecified: Secondary | ICD-10-CM | POA: Insufficient documentation

## 2020-04-08 DIAGNOSIS — F1123 Opioid dependence with withdrawal: Secondary | ICD-10-CM | POA: Diagnosis not present

## 2020-04-08 DIAGNOSIS — E669 Obesity, unspecified: Secondary | ICD-10-CM | POA: Insufficient documentation

## 2020-04-08 DIAGNOSIS — Z91128 Patient's intentional underdosing of medication regimen for other reason: Secondary | ICD-10-CM | POA: Insufficient documentation

## 2020-04-08 DIAGNOSIS — T43596A Underdosing of other antipsychotics and neuroleptics, initial encounter: Secondary | ICD-10-CM | POA: Insufficient documentation

## 2020-04-08 DIAGNOSIS — F319 Bipolar disorder, unspecified: Secondary | ICD-10-CM | POA: Diagnosis present

## 2020-04-08 DIAGNOSIS — Z6836 Body mass index (BMI) 36.0-36.9, adult: Secondary | ICD-10-CM | POA: Insufficient documentation

## 2020-04-08 DIAGNOSIS — Z20822 Contact with and (suspected) exposure to covid-19: Secondary | ICD-10-CM | POA: Insufficient documentation

## 2020-04-08 DIAGNOSIS — R45851 Suicidal ideations: Secondary | ICD-10-CM | POA: Insufficient documentation

## 2020-04-08 DIAGNOSIS — F329 Major depressive disorder, single episode, unspecified: Secondary | ICD-10-CM | POA: Insufficient documentation

## 2020-04-08 DIAGNOSIS — G4700 Insomnia, unspecified: Secondary | ICD-10-CM | POA: Insufficient documentation

## 2020-04-08 DIAGNOSIS — M2550 Pain in unspecified joint: Secondary | ICD-10-CM | POA: Insufficient documentation

## 2020-04-08 DIAGNOSIS — F314 Bipolar disorder, current episode depressed, severe, without psychotic features: Secondary | ICD-10-CM | POA: Diagnosis present

## 2020-04-08 DIAGNOSIS — Z915 Personal history of self-harm: Secondary | ICD-10-CM | POA: Insufficient documentation

## 2020-04-08 DIAGNOSIS — M791 Myalgia, unspecified site: Secondary | ICD-10-CM | POA: Insufficient documentation

## 2020-04-08 DIAGNOSIS — F1120 Opioid dependence, uncomplicated: Secondary | ICD-10-CM | POA: Diagnosis present

## 2020-04-08 DIAGNOSIS — Z79899 Other long term (current) drug therapy: Secondary | ICD-10-CM | POA: Insufficient documentation

## 2020-04-08 LAB — LIPID PANEL
Cholesterol: 252 mg/dL — ABNORMAL HIGH (ref 0–200)
HDL: 50 mg/dL (ref >40)
LDL Cholesterol: 183 mg/dL — ABNORMAL HIGH (ref 0–99)
Total CHOL/HDL Ratio: 5 RATIO
Triglycerides: 96 mg/dL (ref <150)
VLDL: 19 mg/dL (ref 0–40)

## 2020-04-08 LAB — POCT PREGNANCY, URINE: Preg Test, Ur: NEGATIVE

## 2020-04-08 LAB — POCT URINALYSIS DIP (DEVICE)
Bilirubin Urine: NEGATIVE
Glucose, UA: NEGATIVE mg/dL
Ketones, ur: NEGATIVE mg/dL
Nitrite: NEGATIVE
Protein, ur: NEGATIVE mg/dL
Specific Gravity, Urine: >=1.03 (ref 1.005–1.030)
Urobilinogen, UA: 0.2 mg/dL (ref 0.0–1.0)
pH: 6 (ref 5.0–8.0)

## 2020-04-08 LAB — POCT URINE DRUG SCREEN - MANUAL ENTRY (I-SCREEN)
POC Amphetamine UR: NOT DETECTED
POC Buprenorphine (BUP): NOT DETECTED
POC Cocaine UR: NOT DETECTED
POC Marijuana UR: POSITIVE — AB
POC Methadone UR: NOT DETECTED
POC Methamphetamine UR: NOT DETECTED
POC Morphine: NOT DETECTED
POC Oxazepam (BZO): NOT DETECTED
POC Oxycodone UR: NOT DETECTED
POC Secobarbital (BAR): NOT DETECTED

## 2020-04-08 LAB — CBC WITH DIFFERENTIAL/PLATELET
Abs Immature Granulocytes: 0.03 10*3/uL (ref 0.00–0.07)
Basophils Absolute: 0.1 10*3/uL (ref 0.0–0.1)
Basophils Relative: 1 %
Eosinophils Absolute: 0 10*3/uL (ref 0.0–0.5)
Eosinophils Relative: 0 %
HCT: 51.2 % — ABNORMAL HIGH (ref 36.0–46.0)
Hemoglobin: 16.3 g/dL — ABNORMAL HIGH (ref 12.0–15.0)
Immature Granulocytes: 0 %
Lymphocytes Relative: 20 %
Lymphs Abs: 1.9 10*3/uL (ref 0.7–4.0)
MCH: 30.7 pg (ref 26.0–34.0)
MCHC: 31.8 g/dL (ref 30.0–36.0)
MCV: 96.4 fL (ref 80.0–100.0)
Monocytes Absolute: 0.6 10*3/uL (ref 0.1–1.0)
Monocytes Relative: 6 %
Neutro Abs: 7.2 10*3/uL (ref 1.7–7.7)
Neutrophils Relative %: 73 %
Platelets: 381 10*3/uL (ref 150–400)
RBC: 5.31 MIL/uL — ABNORMAL HIGH (ref 3.87–5.11)
RDW: 13.6 % (ref 11.5–15.5)
WBC: 9.9 10*3/uL (ref 4.0–10.5)
nRBC: 0 % (ref 0.0–0.2)

## 2020-04-08 LAB — COMPREHENSIVE METABOLIC PANEL
ALT: 42 U/L (ref 0–44)
AST: 26 U/L (ref 15–41)
Albumin: 4.2 g/dL (ref 3.5–5.0)
Alkaline Phosphatase: 91 U/L (ref 38–126)
Anion gap: 12 (ref 5–15)
BUN: 8 mg/dL (ref 6–20)
CO2: 19 mmol/L — ABNORMAL LOW (ref 22–32)
Calcium: 9.5 mg/dL (ref 8.9–10.3)
Chloride: 108 mmol/L (ref 98–111)
Creatinine, Ser: 0.93 mg/dL (ref 0.44–1.00)
GFR calc Af Amer: >60 mL/min (ref >60)
GFR calc non Af Amer: >60 mL/min (ref >60)
Glucose, Bld: 118 mg/dL — ABNORMAL HIGH (ref 70–99)
Potassium: 3.8 mmol/L (ref 3.5–5.1)
Sodium: 139 mmol/L (ref 135–145)
Total Bilirubin: 0.4 mg/dL (ref 0.3–1.2)
Total Protein: 7.6 g/dL (ref 6.5–8.1)

## 2020-04-08 LAB — TSH: TSH: 0.325 u[IU]/mL — ABNORMAL LOW (ref 0.350–4.500)

## 2020-04-08 LAB — HEMOGLOBIN A1C
Hgb A1c MFr Bld: 5.7 % — ABNORMAL HIGH (ref 4.8–5.6)
Mean Plasma Glucose: 116.89 mg/dL

## 2020-04-08 LAB — SARS CORONAVIRUS 2 BY RT PCR (HOSPITAL ORDER, PERFORMED IN ~~LOC~~ HOSPITAL LAB): SARS Coronavirus 2: NEGATIVE

## 2020-04-08 LAB — POC SARS CORONAVIRUS 2 AG -  ED: SARS Coronavirus 2 Ag: NEGATIVE

## 2020-04-08 MED ORDER — GABAPENTIN 400 MG PO CAPS
400.0000 mg | ORAL_CAPSULE | Freq: Three times a day (TID) | ORAL | Status: DC
  Administered 2020-04-08: 400 mg via ORAL
  Filled 2020-04-08: qty 1

## 2020-04-08 MED ORDER — PANTOPRAZOLE SODIUM 40 MG PO TBEC: 40.0000 mg | DELAYED_RELEASE_TABLET | Freq: Every day | ORAL | Status: DC

## 2020-04-08 MED ORDER — ALUM & MAG HYDROXIDE-SIMETH 200-200-20 MG/5ML PO SUSP: 30.0000 mL | ORAL | Status: DC | PRN

## 2020-04-08 MED ORDER — PROPRANOLOL HCL 10 MG PO TABS
10.0000 mg | ORAL_TABLET | Freq: Two times a day (BID) | ORAL | Status: DC
  Administered 2020-04-08: 10 mg via ORAL
  Filled 2020-04-08: qty 1

## 2020-04-08 MED ORDER — CYCLOBENZAPRINE HCL 10 MG PO TABS: 10.0000 mg | ORAL_TABLET | Freq: Three times a day (TID) | ORAL | Status: DC | PRN

## 2020-04-08 MED ORDER — CLONIDINE HCL 0.1 MG PO TABS
0.1000 mg | ORAL_TABLET | Freq: Two times a day (BID) | ORAL | Status: DC
  Administered 2020-04-08: 0.1 mg via ORAL
  Filled 2020-04-08: qty 1

## 2020-04-08 MED ORDER — OXCARBAZEPINE 300 MG PO TABS
300.0000 mg | ORAL_TABLET | Freq: Two times a day (BID) | ORAL | Status: DC
  Administered 2020-04-08: 300 mg via ORAL
  Filled 2020-04-08: qty 1

## 2020-04-08 MED ORDER — CITALOPRAM HYDROBROMIDE 20 MG PO TABS: 40.0000 mg | ORAL_TABLET | Freq: Every day | ORAL | Status: DC

## 2020-04-08 MED ORDER — NICOTINE 21 MG/24HR TD PT24
21.0000 mg | MEDICATED_PATCH | Freq: Once | TRANSDERMAL | Status: DC
  Administered 2020-04-08: 21 mg via TRANSDERMAL
  Filled 2020-04-08: qty 1

## 2020-04-08 MED ORDER — MAGNESIUM HYDROXIDE 400 MG/5ML PO SUSP: 30.0000 mL | Freq: Every day | ORAL | Status: DC | PRN

## 2020-04-08 MED ORDER — FOLIC ACID 1 MG PO TABS: 1.0000 mg | ORAL_TABLET | Freq: Every day | ORAL | Status: DC

## 2020-04-08 MED ORDER — ACETAMINOPHEN 325 MG PO TABS: 650.0000 mg | ORAL_TABLET | Freq: Four times a day (QID) | ORAL | Status: DC | PRN

## 2020-04-08 MED ORDER — TEMAZEPAM 30 MG PO CAPS: 30.0000 mg | ORAL_CAPSULE | Freq: Every evening | ORAL | Status: DC | PRN

## 2020-04-08 NOTE — ED Notes (Signed)
Patient left belongings with husband. No belongings on the unit.

## 2020-04-08 NOTE — ED Notes (Signed)
Pt given sandwich, applesauce and cookies.

## 2020-04-08 NOTE — ED Notes (Signed)
Report called to [place] @ [facility]

## 2020-04-08 NOTE — BH Assessment (Signed)
Comprehensive Clinical Assessment (CCA) Note  04/08/2020 [name] [id number]  Visit Diagnosis:      ICD-10-CM   1. Severe bipolar I disorder, current or most recent episode depressed ([place])  F31.4   2. Opioid dependence with withdrawal ([facility])  F11.23       CCA Screening, Triage and Referral (STR)  Patient Reported Information How did you hear about [place]? Self  Referral name: Kacie L. Gose  Referral phone number: No data recorded  Whom do you see for routine medical problems? Primary Care  Practice/Facility Name: [facility]  Practice/Facility Phone Number: No data recorded Name of Contact: ''Dr. Stanton Kidney''  Contact Number: No data recorded Contact Fax Number: No data recorded Prescriber Name: No data recorded Prescriber Address (if known): No data recorded  What Is the Reason for Your Visit/Call Today? Pt stopped taking psychotropic meds and other meds ''cold-turkey'' two weeks ago  How Long Has This Been Causing You Problems? 1 wk - 1 month  What Do You Feel Would Help You the Most Today? Other (Comment)   Have You Recently Been in Any Inpatient Treatment (Hospital/Detox/Crisis Center/28-Day Program)? No  Name/Location of Program/Hospital:No data recorded How Long Were You There? No data recorded When Were You Discharged? No data recorded  Have You Ever Received Services From [facility] Before? No  Who Do You See at [facility]? No data recorded  Have You Recently Had Any Thoughts About Hurting Yourself? Yes  Are You Planning to Commit Suicide/Harm Yourself At This time? No   Have you Recently Had Thoughts About [place]? No  Explanation: No data recorded  Have You Used Any Alcohol or Drugs in the Past 24 Hours? No data recorded How Long Ago Did You Use Drugs or Alcohol? No data recorded What Did You Use and How Much? No data recorded  Do You Currently Have a Therapist/Psychiatrist? No  Name of Therapist/Psychiatrist: No data  recorded  Have You Been Recently Discharged From Any Office Practice or Programs? No  Explanation of Discharge From Practice/Program: No data recorded    CCA Screening Triage Referral Assessment Type of Contact: Face-to-Face  Is this Initial or Reassessment? No data recorded Date Telepsych consult ordered in [facility]:  No data recorded Time Telepsych consult ordered in [facility]:  No data recorded  Patient Reported Information Reviewed? Yes  Patient Left Without Being Seen? No data recorded Reason for Not Completing Assessment: No data recorded  Collateral Involvement: No data recorded  Does Patient Have a [place]? No data recorded Name and Contact of Legal Guardian: No data recorded If Minor and Not Living with Parent(s), Who has Custody? No data recorded Is CPS involved or ever been involved? Never  Is APS involved or ever been involved? Never   Patient Determined To Be At Risk for Harm To Self or Others Based on Review of Patient Reported Information or Presenting Complaint? Yes, for Self-Harm  Method: No data recorded Availability of Means: No data recorded Intent: No data recorded Notification Required: No data recorded Additional Information for Danger to Others Potential: No data recorded Additional Comments for Danger to Others Potential: No data recorded Are There Guns or Other Weapons in Your Home? No data recorded Types of Guns/Weapons: No data recorded Are These Weapons Safely Secured?                            No data recorded Who Could Verify You  Are Able To Have These Secured: No data recorded Do You Have any Outstanding Charges, Pending Court Dates, Parole/Probation? No data recorded Contacted To Inform of Risk of Harm To Self or Others: No data recorded  Location of Assessment: [facility]   Does Patient Present under Involuntary Commitment? No  IVC Papers Initial File Date: No data recorded  [place] of Residence:  [place]   Patient Currently Receiving the Following Services: Medication Management   Determination of Need: Emergent (2 hours)   Options For Referral: Medication Management;Inpatient Hospitalization     CCA Biopsychosocial  Intake/Chief Complaint:  CCA Intake With Chief Complaint CCA Part Two Date: 04/08/20 Chief Complaint/Presenting Problem: Despoindent; anxious; SI without plan Patient's Currently Reported Symptoms/Problems: Pt stopped taking medication (including psychotropic meds) ''cold-turkey'' about two weeks ago Type of Services Patient Feels Are Needed: Inpatient Initial Clinical Notes/Concerns: Pt came off of medication cold-turkey, appears to be entering into state of Bipolar I, Depressed  Pt is a 49 year old female who presented to [facility] on a voluntary basis with complaint of increased depression, suicidal ideation, anxiety, and agitation after she stopped taking all of her medication (including psychotropic medication prescribed for Bipolar I Disorder) about two weeks ago.  Pt lives in [place] with her [place], and she is on disability for Bipolar I Disorder, Social Anxiety Disorder, and other concerns.  Pt receives outpatient psychiatric services through [facility].  Pt indicated that she missed last appointment with her psychiatric there (''Dr. Stanton Kidney'').  Pt stated that she decided to stop all of her medication, including psychotropic medication (Klonipin, Seroquel, Celexa, and others) as well as pain medication because she felt that she was on too many medications, and the combination of drugs made her feel strange.  Since stopping her medication ''cold-turkey,'' Pt endorsed the following symptoms:  Increased despondency; suicidal ideation (currently without plan or intent); anxiety; tremulousness; poor sleep (1-2 hours of sleep per night); feelings of worthlessness.  Pt reported that she has attempted suicide four times in her life (most recently in 2016), and  she has a history of self-injurious behavior.  Pt stated that she rarely uses marijuana to control pain.  Last use was about two weeks ago.  Pt does not drink.   Mental Health Symptoms Depression:  Depression: Change in energy/activity, Hopelessness, Fatigue, Tearfulness, Duration of symptoms less than two weeks, Increase/decrease in appetite, Difficulty Concentrating, Sleep (too much or little)  Mania:  Mania: None  Anxiety:   Anxiety: Difficulty concentrating, Irritability, Restlessness, Worrying, Tension, Sleep  Psychosis:  Psychosis: None  Trauma:  Trauma: None  Obsessions:  Obsessions: None  Compulsions:  Compulsions: None  Inattention:  Inattention: None  Hyperactivity/Impulsivity:  Hyperactivity/Impulsivity: N/A  Oppositional/Defiant Behaviors:  Oppositional/Defiant Behaviors: None  Emotional Irregularity:  Emotional Irregularity: None  Other Mood/Personality Symptoms:      Mental Status Exam Appearance and self-care  Stature:  Stature: Average  Weight:  Weight: Average weight  Clothing:  Clothing: Casual  Grooming:  Grooming: Normal  Cosmetic use:  Cosmetic Use: Age appropriate  Posture/gait:  Posture/Gait: Normal  Motor activity:  Motor Activity: Not Remarkable  Sensorium  Attention:  Attention: Normal  Concentration:  Concentration: Anxiety interferes  Orientation:  Orientation: X5  Recall/memory:  Recall/Memory: Normal  Affect and Mood  Affect:  Affect: Labile  Mood:  Mood: Depressed, Anxious  Relating  Eye contact:  Eye Contact: Normal  Facial expression:  Facial Expression: Sad  Attitude toward examiner:  Attitude Toward Examiner: Cooperative  Thought and Language  Speech flow: Speech Flow: Clear and Coherent  Thought content:  Thought Content: Appropriate to Mood and Circumstances  Preoccupation:  Preoccupations: None  Hallucinations:  Hallucinations: None  Organization:     Transport planner of Knowledge:  Fund of Knowledge: Average  Intelligence:   Intelligence: Average  Abstraction:  Abstraction: Normal  Judgement:  Judgement: Poor  Reality Testing:  Reality Testing: Adequate  Insight:  Insight: Fair  Decision Making:  Decision Making: Normal  Social Functioning  Social Maturity:  Social Maturity: Isolates  Social Judgement:  Social Judgement: Normal  Stress  Stressors:  Stressors: Other (Comment) (Off of medication)  Coping Ability:  Coping Ability: Overwhelmed  Skill Deficits:  Skill Deficits: None  Supports:  Supports: Family     Religion:    Leisure/Recreation:    Exercise/Diet: Exercise/Diet Do You Have Any Trouble Sleeping?: Yes Explanation of Sleeping Difficulties: 1-2 hours of sleep per night over last two weeks   CCA Employment/Education  Employment/Work Situation: Employment / Work Situation Employment situation: On disability Why is patient on disability: Bipolar Disorder;Social Anxiety Patient's job has been impacted by current illness: No Has patient ever been in the [employer]?: No  Education: Education Is Patient Currently Attending School?: No   CCA Family/Childhood History  Family and Relationship History: Family history Marital status: Long term relationship What types of issues is patient dealing with in the relationship?: Supportive relationship Are you sexually active?: Yes What is your sexual orientation?: Heterosexual Does patient have children?: No  Childhood History:  Childhood History Does patient have siblings?: No Did patient suffer any verbal/emotional/physical/sexual abuse as a child?: No Did patient suffer from severe childhood neglect?: No Has patient ever been sexually abused/assaulted/raped as an adolescent or adult?: No Was the patient ever a victim of a crime or a disaster?: No Witnessed domestic violence?: No Has patient been affected by domestic violence as an adult?: No  Child/Adolescent Assessment:     CCA Substance Use  Alcohol/Drug Use: Alcohol / Drug  Use Pain Medications: See MAR Prescriptions: See MAR Over the Counter: See MAR History of alcohol / drug use?: Yes Substance #1 Name of Substance 1: Marijuana 1 - Amount (size/oz): Varied 1 - Duration: Rarely 1 - Last Use / Amount: Small amount about two weeks ago                       ASAM's:  Six Dimensions of Multidimensional Assessment  Dimension 1:  Acute Intoxication and/or Withdrawal Potential:      Dimension 2:  Biomedical Conditions and Complications:      Dimension 3:  Emotional, Behavioral, or Cognitive Conditions and Complications:     Dimension 4:  Readiness to Change:     Dimension 5:  Relapse, Continued use, or Continued Problem Potential:     Dimension 6:  Recovery/Living Environment:     ASAM Severity Score:    ASAM Recommended Level of Treatment:     Substance use Disorder (SUD)    Recommendations for Services/Supports/Treatments:    DSM5 Diagnoses: Patient Active Problem List   Diagnosis Date Noted  . Inadequate sleep hygiene 04/06/2017  . Snoring 04/06/2017  . Sleep apnea 04/06/2017  . Sleep deprivation 04/06/2017  . Genetic testing 08/24/2016  . Family history of ovarian cancer   . Family history of uterine cancer   . Family history of prostate cancer   . Dysphagia 06/23/2016  . Cellulitis 03/02/2016  . Loss of weight 02/04/2016  .  Diarrhea of presumed infectious origin 02/04/2016  . Generalized abdominal pain 02/04/2016  . Internal hemorrhoid, bleeding 04/24/2014  . Irregular menstrual cycle 06/22/2012  . TOBACCO ABUSE 10/19/2009  . SYNCOPE 10/19/2009  . CHEST PAIN 10/19/2009  . SYNCOPE, HX OF 10/19/2009  . CANDIDIASIS OF THE ESOPHAGUS 06/17/2009  . LEG PAIN, LEFT 12/22/2008  . HAIR LOSS 06/03/2008  . HEMORRHOIDS, INTERNAL, WITH BLEEDING 11/01/2007  . VITAMIN B12 DEFICIENCY 09/05/2007  . DYSPLASTIC NEVUS 08/14/2007  . THROMBOCYTOSIS 07/03/2007  . Diarrhea 07/03/2007  . 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  HYPERCHOLESTEROLEMIA 05/31/2007  . DISORDER, BIPOLAR NEC 05/31/2007  . PSORIASIS 05/31/2007  . INSOMNIA 05/31/2007  . COCAINE ABUSE 05/14/2007  . PERIODONTAL DISEASE 05/14/2007  . Gastritis and gastroduodenitis 05/14/2007  . DIVERTICULOSIS, COLON, HX OF 05/14/2007  . MYALGIA, [place] OF 05/14/2007    Patient Centered Plan: Patient is on the following Treatment Plan(s):    Referrals to Alternative Service(s): Referred to Alternative Service(s):   Place:   Date:   Time:    Referred to Alternative Service(s):   Place:   Date:   Time:    Referred to Alternative Service(s):   Place:   Date:   Time:    Referred to Alternative Service(s):   Place:   Date:   Time:     MSE:  Pt presented as alert and oriented.  She had good eye contact and was cooperative.  Pt was appropriately groomed in street clothes.  Pt's demeanor was tearful.  Pt's affect was anxious.  Pt's mood was depressed and anxious.  Pt's speech was normal in rate, rhythm, and volume.  Thought processes were within normal range, and thought content was logical and goal-oriented.  There was no evidence of delusion.  Pt denied hallucination.  Pt's memory and concentration were intact.  Pt's insight and impulse control were fair.  Judgment was poor as evidenced by abruptly stopping all medication.  DISPOSITION:  Consulted with S. Rankin, NP, who also assessed Pt.  Per S. Rankin, Pt is to be admitted to [facility] for night, and then placed inpatient -- 300 hall bed at [facility] -- following AM discharges. Laurena Slimmer Samual Beals

## 2020-04-08 NOTE — ED Notes (Signed)
Verified with [facility] pt is accepted tonight to [facility] after covid results.

## 2020-04-08 NOTE — ED Notes (Signed)
Pt A&O, questions re transfer answered. NAD, ambulatory without issue. Transferred to [facility] via [facility].

## 2020-04-08 NOTE — ED Notes (Signed)
URINE PREGNANCY NEGATIVE (-)

## 2020-04-08 NOTE — ED Notes (Signed)
Pt. is awake waiting to be discharged and transported to [facility]

## 2020-04-08 NOTE — ED Provider Notes (Addendum)
Behavioral Health Medical Screening Exam  [name] is a 49 y.o. female patient present to [facility] as walk in with complaints of worsening depression and suicidal ideation with no specific plan but 4 prior attempts.  Patient states that she has been on so much medications that she has become addicted; states medications cause confusion and she has over took medications by accident several time and has almost died related to taking to much medication.  Patient states she has spoken to her PCP and psychiatrist trying to get tapered off but only decreased pain med to Tramadol.  States that she stopped taking her Seroquel and Klonopin over a week ago and now she is feeling like she is about to lose her mind and needs help; states she feels hopeless.  Patient is unable to contract for safety.  History of bipolar disorder and used Lithium in the past that really worked well for her doesn't know why it was stopped.    Total Time spent with patient: 30 minutes  Psychiatric Specialty Exam  Presentation  General Appearance:Appropriate for Environment;Casual;Disheveled  Eye Contact:Good  Speech:Clear and Coherent;Normal Rate  Speech Volume:Normal  Handedness:Right   Mood and Affect  Mood:Anxious;Depressed  Affect:Congruent;Depressed;Tearful   Thought Process  Thought Processes:Coherent;Goal Directed  Descriptions of Associations:Intact  Orientation:Full (Time, Place and Person)  Thought Content:WDL  Hallucinations:None  Ideas of Reference:None  Suicidal Thoughts:No data recorded Homicidal Thoughts:No data recorded  Sensorium  Memory:Immediate Good;Recent Good;Remote Good  Judgment:Intact  Insight:Present   Executive Functions  Concentration:Fair  Attention Span:Fair  [name]  Language:Good   Psychomotor Activity  Psychomotor Activity:Restlessness;Tremor   Assets  Assets:Communication Skills;Desire for  Improvement;Housing;Social Support   Sleep  Sleep:Poor  Number of hours: No data recorded  Physical Exam: Physical Exam Vitals reviewed.  Constitutional:      Appearance: Normal appearance. She is obese.  HENT:     Head: Normocephalic.  Pulmonary:     Effort: Pulmonary effort is normal.  Musculoskeletal:        General: Normal range of motion.  Skin:    General: Skin is warm and dry.  Neurological:     Mental Status: She is alert.  Psychiatric:        Attention and Perception: Attention and perception normal. She does not perceive auditory hallucinations.        Mood and Affect: Mood is anxious and depressed.        Speech: Speech normal.        Behavior: Behavior normal. Behavior is cooperative.        Thought Content: Thought content is not paranoid or delusional. Thought content includes suicidal ideation. Thought content does not include homicidal ideation.        Cognition and Memory: Cognition and memory normal.        Judgment: Judgment normal.    Review of Systems  Musculoskeletal: Positive for joint pain and myalgias.  Psychiatric/Behavioral: Positive for depression, hallucinations and suicidal ideas. Substance abuse: States she has been taking pain medication for so long that she is now addicted and is trying to come off any medicaiton that can lead to addiction/tolerance. The patient is nervous/anxious and has insomnia (States for the last week she has only gotten a total of 2 hours sleep per night).   All other systems reviewed and are negative.  Blood pressure (!) 125/100, pulse 100, temperature 97.8 F (36.6 C), temperature source Temporal, resp. rate 18, height 5' (1.524 m), weight 185 lb (  83.9 kg), last menstrual period 06/14/2012, SpO2 100 %. Body mass index is 36.13 kg/m.  Musculoskeletal: Strength & Muscle Tone: within normal limits Gait & Station: normal Patient leans: N/A   Recommendations:   Inpatient psychiatric treatment  Based on my  evaluation the patient does not appear to have an emergency medical condition.    Patient has been accepted to [facility] after 10 PM 04/08/21.  Cachet Mccutchen, NP 04/08/2020, 6:08 PM

## 2020-04-09 ENCOUNTER — Encounter (HOSPITAL_COMMUNITY): Payer: Self-pay | Admitting: Psychiatry

## 2020-04-09 ENCOUNTER — Inpatient Hospital Stay (HOSPITAL_COMMUNITY)
Admission: AD | Admit: 2020-04-09 | Discharge: 2020-04-17 | DRG: 885 | Disposition: A | Discharge status: Home / Self Care | Payer: Medicare Other | Source: Intra-hospital | Attending: Psychiatry | Admitting: Psychiatry

## 2020-04-09 DIAGNOSIS — F319 Bipolar disorder, unspecified: Secondary | ICD-10-CM | POA: Diagnosis present

## 2020-04-09 DIAGNOSIS — F1123 Opioid dependence with withdrawal: Secondary | ICD-10-CM | POA: Diagnosis present

## 2020-04-09 DIAGNOSIS — F4310 Post-traumatic stress disorder, unspecified: Secondary | ICD-10-CM | POA: Diagnosis present

## 2020-04-09 DIAGNOSIS — G4700 Insomnia, unspecified: Secondary | ICD-10-CM | POA: Diagnosis present

## 2020-04-09 DIAGNOSIS — G8929 Other chronic pain: Secondary | ICD-10-CM | POA: Diagnosis present

## 2020-04-09 DIAGNOSIS — M069 Rheumatoid arthritis, unspecified: Secondary | ICD-10-CM | POA: Diagnosis present

## 2020-04-09 DIAGNOSIS — R1084 Generalized abdominal pain: Secondary | ICD-10-CM | POA: Diagnosis not present

## 2020-04-09 DIAGNOSIS — F411 Generalized anxiety disorder: Secondary | ICD-10-CM | POA: Diagnosis present

## 2020-04-09 DIAGNOSIS — F17210 Nicotine dependence, cigarettes, uncomplicated: Secondary | ICD-10-CM | POA: Diagnosis present

## 2020-04-09 DIAGNOSIS — F17200 Nicotine dependence, unspecified, uncomplicated: Secondary | ICD-10-CM | POA: Diagnosis present

## 2020-04-09 DIAGNOSIS — F314 Bipolar disorder, current episode depressed, severe, without psychotic features: Principal | ICD-10-CM | POA: Diagnosis present

## 2020-04-09 DIAGNOSIS — Z85820 Personal history of malignant melanoma of skin: Secondary | ICD-10-CM

## 2020-04-09 DIAGNOSIS — Z62810 Personal history of physical and sexual abuse in childhood: Secondary | ICD-10-CM | POA: Diagnosis present

## 2020-04-09 DIAGNOSIS — Z20822 Contact with and (suspected) exposure to covid-19: Secondary | ICD-10-CM | POA: Diagnosis present

## 2020-04-09 DIAGNOSIS — R45851 Suicidal ideations: Secondary | ICD-10-CM | POA: Diagnosis present

## 2020-04-09 DIAGNOSIS — M62838 Other muscle spasm: Secondary | ICD-10-CM | POA: Diagnosis present

## 2020-04-09 DIAGNOSIS — I10 Essential (primary) hypertension: Secondary | ICD-10-CM | POA: Diagnosis present

## 2020-04-09 DIAGNOSIS — Z72820 Sleep deprivation: Secondary | ICD-10-CM | POA: Diagnosis not present

## 2020-04-09 DIAGNOSIS — E119 Type 2 diabetes mellitus without complications: Secondary | ICD-10-CM | POA: Diagnosis present

## 2020-04-09 DIAGNOSIS — L4050 Arthropathic psoriasis, unspecified: Secondary | ICD-10-CM | POA: Diagnosis present

## 2020-04-09 DIAGNOSIS — K219 Gastro-esophageal reflux disease without esophagitis: Secondary | ICD-10-CM | POA: Diagnosis present

## 2020-04-09 DIAGNOSIS — F1120 Opioid dependence, uncomplicated: Secondary | ICD-10-CM | POA: Diagnosis present

## 2020-04-09 DIAGNOSIS — Z79899 Other long term (current) drug therapy: Secondary | ICD-10-CM | POA: Diagnosis not present

## 2020-04-09 MED ORDER — DICYCLOMINE HCL 20 MG PO TABS
20.0000 mg | ORAL_TABLET | Freq: Four times a day (QID) | ORAL | Status: AC | PRN
  Administered 2020-04-11 – 2020-04-14 (×6): 20 mg via ORAL
  Filled 2020-04-09 (×6): qty 1

## 2020-04-09 MED ORDER — LORAZEPAM 1 MG PO TABS
1.0000 mg | ORAL_TABLET | Freq: Four times a day (QID) | ORAL | Status: DC | PRN
  Administered 2020-04-09 – 2020-04-11 (×5): 1 mg via ORAL
  Filled 2020-04-09 (×5): qty 1

## 2020-04-09 MED ORDER — TOPIRAMATE 100 MG PO TABS
100.0000 mg | ORAL_TABLET | Freq: Every day | ORAL | Status: DC
  Administered 2020-04-09 – 2020-04-12 (×4): 100 mg via ORAL
  Filled 2020-04-09 (×6): qty 1

## 2020-04-09 MED ORDER — FOLIC ACID 1 MG PO TABS
1.0000 mg | ORAL_TABLET | Freq: Every day | ORAL | Status: DC
  Administered 2020-04-09 – 2020-04-17 (×9): 1 mg via ORAL
  Filled 2020-04-09 (×10): qty 1

## 2020-04-09 MED ORDER — LOPERAMIDE HCL 2 MG PO CAPS
2.0000 mg | ORAL_CAPSULE | ORAL | Status: AC | PRN
  Administered 2020-04-09 – 2020-04-12 (×3): 2 mg via ORAL
  Administered 2020-04-13: 4 mg via ORAL
  Filled 2020-04-09 (×2): qty 1
  Filled 2020-04-09: qty 2
  Filled 2020-04-09: qty 1

## 2020-04-09 MED ORDER — IBUPROFEN 800 MG PO TABS
800.0000 mg | ORAL_TABLET | Freq: Three times a day (TID) | ORAL | Status: DC
  Administered 2020-04-09 – 2020-04-17 (×25): 800 mg via ORAL
  Filled 2020-04-09 (×30): qty 1

## 2020-04-09 MED ORDER — ALUM & MAG HYDROXIDE-SIMETH 200-200-20 MG/5ML PO SUSP: 30.0000 mL | ORAL | Status: DC | PRN

## 2020-04-09 MED ORDER — ACETAMINOPHEN 325 MG PO TABS
650.0000 mg | ORAL_TABLET | Freq: Four times a day (QID) | ORAL | Status: DC | PRN
  Administered 2020-04-14 – 2020-04-16 (×4): 650 mg via ORAL
  Filled 2020-04-09 (×4): qty 2

## 2020-04-09 MED ORDER — ONDANSETRON 4 MG PO TBDP
4.0000 mg | ORAL_TABLET | Freq: Four times a day (QID) | ORAL | Status: AC | PRN
  Administered 2020-04-13: 4 mg via ORAL
  Filled 2020-04-09: qty 1

## 2020-04-09 MED ORDER — NICOTINE 21 MG/24HR TD PT24
21.0000 mg | MEDICATED_PATCH | Freq: Every day | TRANSDERMAL | Status: DC
  Administered 2020-04-09 – 2020-04-17 (×9): 21 mg via TRANSDERMAL
  Filled 2020-04-09 (×10): qty 1

## 2020-04-09 MED ORDER — CLONIDINE HCL 0.1 MG PO TABS
0.1000 mg | ORAL_TABLET | Freq: Two times a day (BID) | ORAL | Status: DC
  Filled 2020-04-09 (×3): qty 1

## 2020-04-09 MED ORDER — OXCARBAZEPINE 300 MG PO TABS
300.0000 mg | ORAL_TABLET | Freq: Two times a day (BID) | ORAL | Status: DC
  Administered 2020-04-09 – 2020-04-16 (×15): 300 mg via ORAL
  Filled 2020-04-09 (×17): qty 1

## 2020-04-09 MED ORDER — PRAZOSIN HCL 1 MG PO CAPS
1.0000 mg | ORAL_CAPSULE | Freq: Every day | ORAL | Status: DC
  Administered 2020-04-09 – 2020-04-11 (×3): 1 mg via ORAL
  Filled 2020-04-09 (×5): qty 1

## 2020-04-09 MED ORDER — TEMAZEPAM 15 MG PO CAPS: 30.0000 mg | ORAL_CAPSULE | Freq: Every evening | ORAL | Status: DC | PRN

## 2020-04-09 MED ORDER — QUETIAPINE FUMARATE 200 MG PO TABS
200.0000 mg | ORAL_TABLET | Freq: Every day | ORAL | Status: DC
  Administered 2020-04-09: 200 mg via ORAL
  Filled 2020-04-09 (×2): qty 1

## 2020-04-09 MED ORDER — PROPRANOLOL HCL 10 MG PO TABS
10.0000 mg | ORAL_TABLET | Freq: Two times a day (BID) | ORAL | Status: DC
  Administered 2020-04-09 – 2020-04-10 (×3): 10 mg via ORAL
  Filled 2020-04-09 (×5): qty 1

## 2020-04-09 MED ORDER — CITALOPRAM HYDROBROMIDE 40 MG PO TABS
40.0000 mg | ORAL_TABLET | Freq: Every day | ORAL | Status: DC
  Administered 2020-04-09 – 2020-04-17 (×9): 40 mg via ORAL
  Filled 2020-04-09 (×10): qty 1

## 2020-04-09 MED ORDER — CYCLOBENZAPRINE HCL 10 MG PO TABS
10.0000 mg | ORAL_TABLET | Freq: Three times a day (TID) | ORAL | Status: DC | PRN
  Administered 2020-04-09 – 2020-04-17 (×21): 10 mg via ORAL
  Filled 2020-04-09 (×21): qty 1

## 2020-04-09 MED ORDER — GABAPENTIN 400 MG PO CAPS
400.0000 mg | ORAL_CAPSULE | Freq: Three times a day (TID) | ORAL | Status: DC
  Administered 2020-04-09 – 2020-04-11 (×8): 400 mg via ORAL
  Filled 2020-04-09 (×13): qty 1

## 2020-04-09 MED ORDER — FUROSEMIDE 40 MG PO TABS
40.0000 mg | ORAL_TABLET | Freq: Two times a day (BID) | ORAL | Status: DC
  Filled 2020-04-09 (×3): qty 1

## 2020-04-09 MED ORDER — PANTOPRAZOLE SODIUM 40 MG PO TBEC
40.0000 mg | DELAYED_RELEASE_TABLET | Freq: Every day | ORAL | Status: DC
  Administered 2020-04-09 – 2020-04-17 (×9): 40 mg via ORAL
  Filled 2020-04-09 (×10): qty 1

## 2020-04-09 MED ORDER — MAGNESIUM HYDROXIDE 400 MG/5ML PO SUSP: 30.0000 mL | Freq: Every day | ORAL | Status: DC | PRN

## 2020-04-09 MED ORDER — NAPROXEN 500 MG PO TABS
500.0000 mg | ORAL_TABLET | Freq: Two times a day (BID) | ORAL | Status: AC | PRN
  Administered 2020-04-09 – 2020-04-13 (×7): 500 mg via ORAL
  Filled 2020-04-09 (×7): qty 1
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
## 2020-04-09 NOTE — Tx Team (Signed)
Initial Treatment Plan 04/09/2020 5:19 AM Gus Rankin MEB:[id number]    PATIENT STRESSORS: Marital or family conflict Medication change or noncompliance   PATIENT STRENGTHS: Ability for insight Average or above average intelligence   PATIENT IDENTIFIED PROBLEMS: Medication non compliance  Major Depression                   DISCHARGE CRITERIA:  Improved stabilization in mood, thinking, and/or behavior Verbal commitment to aftercare and medication compliance  PRELIMINARY DISCHARGE PLAN: Return to previous living arrangement  PATIENT/FAMILY INVOLVEMENT: This treatment plan has been presented to and reviewed with the patient, [name].  The patient and family have been given the opportunity to ask questions and make suggestions.  Isabella Bowens, RN 04/09/2020, 5:19 AM
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
## 2020-04-09 NOTE — Progress Notes (Signed)
   04/09/20 2020  COVID-19 Daily Checkoff  Have you had a fever (temp > 37.80C/100F)  in the past 24 hours?  No  COVID-19 EXPOSURE  Have you traveled outside the state in the past 14 days? No  Have you been in contact with someone with a confirmed diagnosis of COVID-19 or PUI in the past 14 days without wearing appropriate PPE? No  Have you been living in the same home as a person with confirmed diagnosis of COVID-19 or a PUI (household contact)? No  Have you been diagnosed with COVID-19? No

## 2020-04-09 NOTE — Progress Notes (Signed)
Pt was greeted at the beginning of the shift in her bed. Pt said she was tired since she had been admitted early morning. Pt said that she's here for medication management. Pt said she was on 10 mg of percocet qid for her rheumatoid arthritis and she wanted to get off of it, so the doctor switched her to tramadol. But she said she wanted to get off of that too, so she was able to discuss that with the MD in the morning. She's happy since he's already made several changes. Active listening, reassurance, and support provided. Pt denies SI/HI and AVH. Q 15 min safety checks continue. Pt's safety has been maintained.    04/09/20 2020  Psych Admission Type (Psych Patients Only)  Admission Status Voluntary  Psychosocial Assessment  Patient Complaints Anxiety;Depression;Sadness;Substance abuse  Eye Contact Brief  Facial Expression Flat;Sad  Affect Anxious;Depressed;Sad  Speech Logical/coherent  Interaction Assertive  Motor Activity Slow  Appearance/Hygiene Disheveled  Behavior Characteristics Cooperative;Anxious;Calm  Mood Depressed;Anxious;Sad;Pleasant  Thought Process  Coherency WDL  Content WDL  Delusions None reported or observed  Perception WDL  Hallucination None reported or observed  Judgment Poor  Confusion None  Danger to Self  Current suicidal ideation? Denies  Danger to Others  Danger to Others None reported or observed

## 2020-04-09 NOTE — Progress Notes (Signed)
D:  Patient's self inventory sheet, patient has fair sleep, no sleep medication.  Fair appetite, low energy level, poor concentration.  Rated depression 7, hopeless 5, anxiety 10.  Withdrawals, tremors, agitation, nausea, irritability.  Denied SI.  Physical problems, headaches, worst pain #8 in past 24 hours.  No pain medicine.  Goal is feeling better.  Plans to rest.  Does have discharge plans. A:  Medications administered per MD orders.  Emotional support and encouragement given patient. R:  Denied SI and HI, contracts for safety.  Denied A/V hallucinations.  Safety maintained with 15 minute checks.

## 2020-04-09 NOTE — Progress Notes (Signed)
[name] admitted from [facility] under Voluntary commitment for suicidal ideation with no specific plan. Medication changes were made by her doctor now she feels out of her mind per nursing report. She is Ox4, ambulates independently. She reports being very depressed and she was tearful on admission. Searched upon entry to the unit no contraband found.

## 2020-04-09 NOTE — Plan of Care (Signed)
Patient newly admitted to [facility] and requires medication stabilization.

## 2020-04-09 NOTE — H&P (Signed)
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 Level/Precautions:  15 minute checks  Laboratory:  Per ED, UDS (+) for Benzodiazepine, opioid &THC  Psychotherapy: Group sessions  Medications: See MAR   Consultations: As needed  Discharge Concerns: Safety, mood stability  Estimated LOS: 3-5 days  Other: Admit to the 300-hall   Physician Treatment Plan for Primary Diagnosis: Severe bipolar I disorder, current or most recent episode depressed ([place])  Long Term Goal(s): Improvement in symptoms so as ready for discharge  Short Term Goals: Ability to identify changes in lifestyle to reduce recurrence of condition will improve, Ability to verbalize feelings will improve, Ability to  disclose and discuss suicidal ideas and Ability to demonstrate self-control will improve  Physician Treatment Plan for Secondary Diagnosis: Principal Problem:   Severe bipolar I disorder, current or most recent episode depressed ([facility]) Active Problems:   TOBACCO ABUSE   Generalized abdominal pain   Sleep deprivation   Opiate dependence ([place])   Bipolar 1 disorder ([facility])   PTSD (post-traumatic stress disorder)  Long Term Goal(s): Improvement in symptoms so as ready for discharge  Short Term Goals: Ability to identify and develop effective coping behaviors will improve, Compliance with prescribed medications will improve and Ability to identify triggers associated with substance abuse/mental health issues will improve  I certify that inpatient services furnished can reasonably be expected to improve the patient's condition.    Lindell Spar, NP, PMHNP, FNP-BC 8/12/20212:26 PM

## 2020-04-09 NOTE — Plan of Care (Signed)
Nurse discussed anxiety, depression and coping skills with patient.  

## 2020-04-10 DIAGNOSIS — F314 Bipolar disorder, current episode depressed, severe, without psychotic features: Secondary | ICD-10-CM | POA: Diagnosis not present

## 2020-04-10 LAB — T4: T4, Total: 6.2 ug/dL (ref 4.5–12.0)

## 2020-04-10 LAB — T3: T3, Total: 84 ng/dL (ref 71–180)

## 2020-04-10 MED ORDER — QUETIAPINE FUMARATE 300 MG PO TABS
300.0000 mg | ORAL_TABLET | Freq: Every day | ORAL | Status: DC
  Administered 2020-04-10 – 2020-04-11 (×2): 300 mg via ORAL
  Filled 2020-04-10 (×4): qty 1

## 2020-04-10 NOTE — Tx Team (Signed)
Interdisciplinary Treatment and Diagnostic Plan Update  04/10/2020 Time of Session: 9:05am [name] MRN: [id number]  Principal Diagnosis: Severe bipolar I disorder, current or most recent episode depressed ([place])  Secondary Diagnoses: Principal Problem:   Severe bipolar I disorder, current or most recent episode depressed ([place]) Active Problems:   TOBACCO ABUSE   Generalized abdominal pain   Sleep deprivation   Opiate dependence ([place])   Bipolar 1 disorder ([facility])   PTSD (post-traumatic stress disorder)   Current Medications:  Current Facility-Administered Medications  Medication Dose Route Frequency Provider Last Rate Last Admin  . acetaminophen (TYLENOL) tablet 650 mg  650 mg Oral Q6H PRN Sharma Covert, MD      . alum & mag hydroxide-simeth (MAALOX/MYLANTA) 200-200-20 MG/5ML suspension 30 mL  30 mL Oral Q4H PRN Sharma Covert, MD      . citalopram (CELEXA) tablet 40 mg  40 mg Oral Daily Caroline Sauger, NP   40 mg at 04/10/20 0925  . cyclobenzaprine (FLEXERIL) tablet 10 mg  10 mg Oral TID PRN Caroline Sauger, NP   10 mg at 04/09/20 1442  . dicyclomine (BENTYL) tablet 20 mg  20 mg Oral Q6H PRN Sharma Covert, MD      . folic acid (FOLVITE) tablet 1 mg  1 mg Oral Daily Caroline Sauger, NP   1 mg at 04/10/20 0926  . gabapentin (NEURONTIN) capsule 400 mg  400 mg Oral TID Caroline Sauger, NP   400 mg at 04/10/20 0925  . ibuprofen (ADVIL) tablet 800 mg  800 mg Oral TID Caroline Sauger, NP   800 mg at 04/10/20 0928  . loperamide (IMODIUM) capsule 2-4 mg  2-4 mg Oral PRN Sharma Covert, MD   2 mg at 04/09/20 2144  . LORazepam (ATIVAN) tablet 1 mg  1 mg Oral Q6H PRN Sharma Covert, MD   1 mg at 04/09/20 2328  . magnesium hydroxide (MILK OF MAGNESIA) suspension 30 mL  30 mL Oral Daily PRN Sharma Covert, MD      . naproxen (NAPROSYN) tablet 500 mg  500 mg Oral BID PRN Sharma Covert, MD   500 mg at 04/09/20 1442  . nicotine (NICODERM  CQ - dosed in mg/24 hours) patch 21 mg  21 mg Transdermal Daily Sharma Covert, MD   21 mg at 04/10/20 0928  . ondansetron (ZOFRAN-ODT) disintegrating tablet 4 mg  4 mg Oral Q6H PRN Sharma Covert, MD      . Oxcarbazepine (TRILEPTAL) tablet 300 mg  300 mg Oral BID Caroline Sauger, NP   300 mg at 04/10/20 0926  . pantoprazole (PROTONIX) EC tablet 40 mg  40 mg Oral Daily Caroline Sauger, NP   40 mg at 04/10/20 0925  . prazosin (MINIPRESS) capsule 1 mg  1 mg Oral QHS Sharma Covert, MD   1 mg at 04/09/20 2141  . propranolol (INDERAL) tablet 10 mg  10 mg Oral BID Caroline Sauger, NP   10 mg at 04/10/20 0925  . QUEtiapine (SEROQUEL) tablet 200 mg  200 mg Oral QHS Sharma Covert, MD   200 mg at 04/09/20 2141  . topiramate (TOPAMAX) tablet 100 mg  100 mg Oral QHS Sharma Covert, MD   100 mg at 04/09/20 2141   PTA Medications: Medications Prior to Admission  Medication Sig Dispense Refill Last Dose  . citalopram (CELEXA) 40 MG tablet Take 40 mg by mouth daily.   1   . clonazePAM (KLONOPIN) 1 MG  tablet Take 1 mg by mouth 2 (two) times daily as needed for anxiety.     . cloNIDine (CATAPRES) 0.1 MG tablet Take 0.1 mg by mouth 2 (two) times daily as needed. High Blood Pressure     . cyclobenzaprine (FLEXERIL) 10 MG tablet Take 10 mg by mouth 3 (three) times daily as needed for muscle spasms.     [name] gabapentin (NEURONTIN) 400 MG capsule Take 400 mg by mouth 3 (three) times daily.     [name] HUMIRA PEN 40 MG/0.4ML PNKT Inject 40 mg into the muscle every 14 (fourteen) days.      . hydrocortisone (ANUSOL-HC) 2.5 % rectal cream Apply 1 application topically 3 (three) times daily.     . OXcarbazepine (TRILEPTAL) 150 MG tablet Take 300 mg by mouth 2 (two) times daily.     . pantoprazole (PROTONIX) 40 MG tablet Take 1 tablet (40 mg total) by mouth daily. 90 tablet 3   . promethazine (PHENERGAN) 25 MG tablet Take 25 mg by mouth 2 (two) times daily as needed for nausea or vomiting.   3    . propranolol (INDERAL) 10 MG tablet Take 10 mg by mouth 2 (two) times daily.     . QUEtiapine (SEROQUEL) 300 MG tablet Take 300 mg by mouth at bedtime.     . sucralfate (CARAFATE) 1 g tablet Take 1 g by mouth 3 (three) times daily as needed (acid).     . topiramate (TOPAMAX) 100 MG tablet Take 100 mg by mouth 2 (two) times daily.     [name] torsemide (DEMADEX) 20 MG tablet Take 20 mg by mouth daily as needed (fluid).     . traMADol (ULTRAM) 50 MG tablet Take 50 mg by mouth every 6 (six) hours as needed.     . zolpidem (AMBIEN) 10 MG tablet Take 10 mg by mouth at bedtime.     . dicyclomine (BENTYL) 10 MG capsule Take 2 capsules (20 mg total) 3 (three) times daily by mouth. 540 capsule 3   . folic acid (FOLVITE) 1 MG tablet Take 1 mg by mouth daily. (Patient not taking: Reported on 04/09/2020)  11 Not Taking at Unknown time  . furosemide (LASIX) 40 MG tablet Take 40 mg by mouth 2 (two) times daily. (Patient not taking: Reported on 04/09/2020)   Not Taking at Unknown time  . ibuprofen (ADVIL,MOTRIN) 800 MG tablet Take 1 tablet (800 mg total) by mouth 3 (three) times daily. (Patient not taking: Reported on 04/09/2020) 21 tablet 0 Completed Course at Unknown time  . mupirocin cream (BACTROBAN) 2 % Apply 1 application topically 2 (two) times daily. (Patient not taking: Reported on 04/09/2020) 15 g 0 Completed Course at Unknown time  . sucralfate (CARAFATE) 1 GM/10ML suspension Take 10 mLs (1 g total) by mouth 4 (four) times daily -  with meals and at bedtime. (Patient not taking: Reported on 12/03/2017) 420 mL 0 Completed Course at Unknown time  . temazepam (RESTORIL) 30 MG capsule Take 30 mg by mouth at bedtime as needed for sleep.  (Patient not taking: Reported on 04/09/2020)  1 Not Taking at Unknown time    Patient Stressors: Marital or family conflict Medication change or noncompliance  Patient Strengths: Ability for insight Average or above average intelligence  Treatment Modalities: Medication  Management, Group therapy, Case management,  1 to 1 session with clinician, Psychoeducation, Recreational therapy.   Physician Treatment Plan for Primary Diagnosis: Severe bipolar I disorder, current or most recent episode depressed ([place])  Long Term Goal(s): Improvement in symptoms so as ready for discharge Improvement in symptoms so as ready for discharge   Short Term Goals: Ability to identify changes in lifestyle to reduce recurrence of condition will improve Ability to verbalize feelings will improve Ability to disclose and discuss suicidal ideas Ability to demonstrate self-control will improve Ability to identify and develop effective coping behaviors will improve Compliance with prescribed medications will improve Ability to identify triggers associated with substance abuse/mental health issues will improve  Medication Management: Evaluate patient's response, side effects, and tolerance of medication regimen.  Therapeutic Interventions: 1 to 1 sessions, Unit Group sessions and Medication administration.  Evaluation of Outcomes: Not Met  Physician Treatment Plan for Secondary Diagnosis: Principal Problem:   Severe bipolar I disorder, current or most recent episode depressed ([place]) Active Problems:   TOBACCO ABUSE   Generalized abdominal pain   Sleep deprivation   Opiate dependence ([place])   Bipolar 1 disorder ([facility])   PTSD (post-traumatic stress disorder)  Long Term Goal(s): Improvement in symptoms so as ready for discharge Improvement in symptoms so as ready for discharge   Short Term Goals: Ability to identify changes in lifestyle to reduce recurrence of condition will improve Ability to verbalize feelings will improve Ability to disclose and discuss suicidal ideas Ability to demonstrate self-control will improve Ability to identify and develop effective coping behaviors will improve Compliance with prescribed medications will improve Ability to identify triggers associated  with substance abuse/mental health issues will improve     Medication Management: Evaluate patient's response, side effects, and tolerance of medication regimen.  Therapeutic Interventions: 1 to 1 sessions, Unit Group sessions and Medication administration.  Evaluation of Outcomes: Not Met   RN Treatment Plan for Primary Diagnosis: Severe bipolar I disorder, current or most recent episode depressed ([place]) Long Term Goal(s): Knowledge of disease and therapeutic regimen to maintain health will improve  Short Term Goals: Ability to remain free from injury will improve, Ability to verbalize frustration and anger appropriately will improve, Ability to identify and develop effective coping behaviors will improve and Compliance with prescribed medications will improve  Medication Management: RN will administer medications as ordered by provider, will assess and evaluate patient's response and provide education to patient for prescribed medication. RN will report any adverse and/or side effects to prescribing provider.  Therapeutic Interventions: 1 on 1 counseling sessions, Psychoeducation, Medication administration, Evaluate responses to treatment, Monitor vital signs and CBGs as ordered, Perform/monitor CIWA, COWS, AIMS and Fall Risk screenings as ordered, Perform wound care treatments as ordered.  Evaluation of Outcomes: Not Met   LCSW Treatment Plan for Primary Diagnosis: Severe bipolar I disorder, current or most recent episode depressed ([place]) Long Term Goal(s): Safe transition to appropriate next level of care at discharge, Engage patient in therapeutic group addressing interpersonal concerns.  Short Term Goals: Engage patient in aftercare planning with referrals and resources, Increase social support, Identify triggers associated with mental health/substance abuse issues and Increase skills for wellness and recovery  Therapeutic Interventions: Assess for all discharge needs, 1 to 1 time with  Social worker, Explore available resources and support systems, Assess for adequacy in community support network, Educate family and significant other(s) on suicide prevention, Complete Psychosocial Assessment, Interpersonal group therapy.  Evaluation of Outcomes: Not Met   Progress in Treatment: Attending groups: No. Participating in groups: No. Taking medication as prescribed: Yes. Toleration medication: Yes. Family/Significant other contact made: No, will contact:  husband. Patient understands diagnosis: Yes. Discussing patient identified problems/goals with  staff: Yes. Medical problems stabilized or resolved: Yes. Denies suicidal/homicidal ideation: Yes. Issues/concerns per patient self-inventory: No.  New problem(s) identified: No, Describe:  none.  New Short Term/Long Term Goal(s): medication stabilization, elimination of SI thoughts, development of comprehensive mental wellness plan.   Patient Goals:  Patient did not attend.   Discharge Plan or Barriers:   Reason for Continuation of Hospitalization: Anxiety Depression Medication stabilization  Estimated Length of Stay: 3-5 days  Attendees: Patient: Did not attend 04/10/2020   Physician: Myles Lipps, MD 04/10/2020   Nursing:  04/10/2020   RN Care Manager: 04/10/2020   Social Worker: Darletta Moll, LCSW 04/10/2020   Recreational Therapist:  04/10/2020   Other:  04/10/2020   Other:  04/10/2020   Other: 04/10/2020       Scribe for Treatment Team: Vassie Moselle, LCSW 04/10/2020 10:36 AM

## 2020-04-10 NOTE — Progress Notes (Signed)
   04/10/20 1950  COVID-19 Daily Checkoff  Have you had a fever (temp > 37.80C/100F)  in the past 24 hours?  No  COVID-19 EXPOSURE  Have you traveled outside the state in the past 14 days? No  Have you been in contact with someone with a confirmed diagnosis of COVID-19 or PUI in the past 14 days without wearing appropriate PPE? No  Have you been living in the same home as a person with confirmed diagnosis of COVID-19 or a PUI (household contact)? No  Have you been diagnosed with COVID-19? No

## 2020-04-10 NOTE — Progress Notes (Signed)
Pt has been very tearful remembering the recent losses that she's had in her family and is still in the grieving process. Pt said "I lost the side of my family that I was closest to." Pt said this was her Mom's side. She said she lost her Dad in 12/01/05 and when that happened she was so depressed that she didn't leave the house for 3 years. She lost her mother in 2014/12/02 to an unexpected death and she was in [place] at that time, so she wasn't able to see her in time. Pt also mentions the loss of her grandmother and how she was on hospice, but she wouldn't let go until she told her it was okay to. Before the loss of her mother, pt mentions injuring her left leg while she was wearing wedges and requiring pain management with opiates before she opted to have the surgery done. Pt said that the opiates were helping her with more than just the physical pain, but also the "mental pain" with all the deaths in the family. Pt provided active listening, reassurance, and support. Pt felt slightly better after communicating with this Probation officer and she was administered her PRN Ativan for anxiety at 12-02-50. Pt is passively suicidal without a plan. Denies HI and AVH. Verbally contracts for safety. Q 15 min safety checks continue. Pt's safety has been maintained.   04/10/20 1950  Psych Admission Type (Psych Patients Only)  Admission Status Voluntary  Psychosocial Assessment  Patient Complaints Anxiety;Depression;Crying spells;Hopelessness;Shakiness;Worrying  Eye Contact Brief  Facial Expression Sad;Worried  Affect Anxious;Depressed;Sad  Speech Logical/coherent  Interaction Assertive  Motor Activity Slow  Appearance/Hygiene Disheveled  Behavior Characteristics Cooperative;Anxious  Mood Depressed;Anxious;Despair;Sad  Thought Process  Coherency WDL  Content WDL  Delusions None reported or observed  Perception WDL  Hallucination None reported or observed  Judgment Poor  Confusion None  Danger to Self  Current suicidal  ideation? Passive  Self-Injurious Behavior No self-injurious ideation or behavior indicators observed or expressed   Agreement Not to Harm Self Yes  Description of Agreement verbally contracts for safety  Danger to Others  Danger to Others None reported or observed

## 2020-04-10 NOTE — Progress Notes (Signed)
Pt presents with anxiety and irritability.  Pt reports that she has been arguing with her fiance over the phone because "he's trying to move another girl in the house."  Pt endorses withdrawal symptoms, headache, GI, anxiety, sweating.  PRN ativan administered.  Pt denies SI/HI/AVH.  Pt is currently sleeping at this time.  Pt is safe on the unit with q 15 min safety checks. RN will continue to monitor and provide assistance as indicated.

## 2020-04-10 NOTE — Progress Notes (Signed)
[facility] MD Progress Note  04/10/2020 11:59 AM [name]  MRN:  [id number] Subjective: Patient is a 49 year old female with a past psychiatric history significant for bipolar disorder opiate dependence and benzodiazepine use disorder who was admitted on 04/08/2020 with suicidal ideation.  Objective: Patient is seen and examined.  Patient is a 49 year old female with the above-stated past medical and psychiatric history who is seen in follow-up.  She states she feels between 40 and 60% better.  Most of this is most likely treating her withdrawal symptoms.  She denied any suicidal ideation.  She did she state that she slept better last night.  She did admit to having some headaches related to withdrawal.  She denied any side effects to her current medications.  Her blood pressure was actually a little low this morning.  It was 94/69.  She is afebrile.  Although she did state that she slept better the electronic medical record revealed from nursing notes that she only slept 4.75 hours.  Her current medications include citalopram, Flexeril, the opiate detox protocol, lorazepam for withdrawal symptoms, Neurontin, propranolol, Seroquel and topiramate.  On admission her TSH was 0.325.  Her T3 was 21 which is normal.  Her T4 was 6.2 which is again normal as well.  Her hemoglobin A1c is 5.7 which is mild diet-controlled diabetes.  Principal Problem: Severe bipolar I disorder, current or most recent episode depressed ([place]) Diagnosis: Principal Problem:   Severe bipolar I disorder, current or most recent episode depressed ([place]) Active Problems:   TOBACCO ABUSE   Generalized abdominal pain   Sleep deprivation   Opiate dependence ([place])   Bipolar 1 disorder ([facility])   PTSD (post-traumatic stress disorder)  Total Time spent with patient: 20 minutes  Past Psychiatric History: See admission H&P  Past Medical History:  Past Medical History:  Diagnosis Date   Anxiety    Arthritis    left knee   Astigmatism     both eyes   Cancer ([place])    melanoma removed from back    Cataract    left eye   Depression    Diverticulosis    Erosive esophagitis    Family history of ovarian cancer    Family history of prostate cancer    Family history of uterine cancer    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   Jaundice    MRSA (methicillin resistant Staphylococcus aureus)    hospitalized for 48 hours in Dec 2016   Staph infection    "in my blood"    Past Surgical History:  Procedure Laterality Date   CHOLECYSTECTOMY  08/02/2012   Procedure: LAPAROSCOPIC CHOLECYSTECTOMY WITH INTRAOPERATIVE CHOLANGIOGRAM;  Surgeon: Rolm Bookbinder, MD;  Location: [place];  Service: General;  Laterality: N/A;   COLONOSCOPY WITH PROPOFOL  04/09/2014   COLONOSCOPY WITH PROPOFOL N/A 07/14/2016   Procedure: COLONOSCOPY WITH PROPOFOL;  Surgeon: Milus Banister, MD;  Location: [facility];  Service: Endoscopy;  Laterality: N/A;   DILATION AND CURETTAGE OF UTERUS     w/ hysteroscopy to remove cyst   ESOPHAGOGASTRODUODENOSCOPY N/A 12/02/2017   Procedure: ESOPHAGOGASTRODUODENOSCOPY (EGD);  Surgeon: Carol Ada, MD;  Location: [facility];  Service: Endoscopy;  Laterality: N/A;   ESOPHAGOGASTRODUODENOSCOPY (EGD) WITH PROPOFOL  04/09/2014  ESOPHAGOGASTRODUODENOSCOPY (EGD) WITH PROPOFOL N/A 07/14/2016   Procedure: ESOPHAGOGASTRODUODENOSCOPY (EGD) WITH PROPOFOL;  Surgeon: Milus Banister, MD;  Location: [facility];  Service: Endoscopy;  Laterality: N/A;   HYSTEROSCOPY WITH D & C  06/22/2012   Procedure: DILATATION AND CURETTAGE /HYSTEROSCOPY;  Surgeon: Lahoma Crocker, MD;  Location: [facility];  Service: Gynecology;  Laterality: N/A;   KNEE ARTHROSCOPY  2011   left   ORIF TOE FRACTURE Left 11/05/2014   Procedure:  OPEN REDUCTION INTERNAL FIXATION (ORIF) LEFT FIFTH METATARSAL (TOE) FRACTURE WITH CALCANEAL BONE GRAFT;  Surgeon: Dorna Leitz, MD;  Location: [facility];  Service: Orthopedics;  Laterality: Left;   WISDOM TOOTH EXTRACTION     Family History:  Family History  Problem Relation Age of Onset   Heart disease Father    Stroke Father    Heart attack Father    Skin cancer Father    Diabetes Father    Uterine cancer Mother 34   COPD Mother    Cancer - Colon Maternal Grandfather        mets to bone   Stomach cancer Paternal Grandmother    Cervical cancer Sister        dx between 36-33   Migraines Sister    Prostate cancer Maternal Uncle 48       mets to bone   Ovarian cancer Paternal Aunt    Heart attack Paternal Grandfather    Skin cancer Paternal Aunt    Family Psychiatric  History: See admission H&P Social History:  Social History   Substance and Sexual Activity  Alcohol Use No   Alcohol/week: 0.0 standard drinks     Social History   Substance and Sexual Activity  Drug Use No   Types: Marijuana   Comment: history of use    Social History   Socioeconomic History   Marital status: Significant Other    Spouse name: Not on file   Number of children: 0   Years of education: College   Highest education level: Not on file  Occupational History   Occupation: Disability  Tobacco Use   Smoking status: Current Every Day Smoker    Packs/day: 1.00    Years: 33.00    Pack years: 33.00    Types: Cigarettes   Smokeless tobacco: Never Used   Tobacco comment: form given 02-04-16   Substance and Sexual Activity   Alcohol use: No    Alcohol/week: 0.0 standard drinks   Drug use: No    Types: Marijuana    Comment: history of use   Sexual activity: Yes    Birth control/protection: Post-menopausal  Other Topics Concern   Not on file  Social History Narrative   Lives at home w/ significant other   Right-handed   Caffeine: coffee "all  day"   Social Determinants of Radio broadcast assistant Strain:    Difficulty of Paying Living Expenses:   Food Insecurity:    Worried About Charity fundraiser in the Last Year:    Arboriculturist in the Last Year:   Transportation Needs:    Film/video editor (Medical):    Lack of Transportation (Non-Medical):   Physical Activity:    Days of Exercise per Week:    Minutes of Exercise per Session:   Stress:    Feeling of Stress :   Social Connections:    Frequency of Communication with Friends and Family:    Frequency of Social Gatherings with Friends and Family:  Attends Religious Services:    Active Member of Clubs or Organizations:    Attends Archivist Meetings:    Marital Status:    Additional Social History:                         Sleep: Fair  Appetite:  Fair  Current Medications: Current Facility-Administered Medications  Medication Dose Route Frequency Provider Last Rate Last Admin   acetaminophen (TYLENOL) tablet 650 mg  650 mg Oral Q6H PRN Sharma Covert, MD       alum & mag hydroxide-simeth (MAALOX/MYLANTA) 200-200-20 MG/5ML suspension 30 mL  30 mL Oral Q4H PRN Sharma Covert, MD       citalopram (CELEXA) tablet 40 mg  40 mg Oral Daily Caroline Sauger, NP   40 mg at 04/10/20 0925   cyclobenzaprine (FLEXERIL) tablet 10 mg  10 mg Oral TID PRN Caroline Sauger, NP   10 mg at 04/10/20 1139   dicyclomine (BENTYL) tablet 20 mg  20 mg Oral Q6H PRN Sharma Covert, MD       folic acid (FOLVITE) tablet 1 mg  1 mg Oral Daily Caroline Sauger, NP   1 mg at 04/10/20 8182   gabapentin (NEURONTIN) capsule 400 mg  400 mg Oral TID Caroline Sauger, NP   400 mg at 04/10/20 9937   ibuprofen (ADVIL) tablet 800 mg  800 mg Oral TID Caroline Sauger, NP   800 mg at 04/10/20 1696   loperamide (IMODIUM) capsule 2-4 mg  2-4 mg Oral PRN Sharma Covert, MD   2 mg at 04/09/20 2144   LORazepam (ATIVAN)  tablet 1 mg  1 mg Oral Q6H PRN Sharma Covert, MD   1 mg at 04/09/20 2328   magnesium hydroxide (MILK OF MAGNESIA) suspension 30 mL  30 mL Oral Daily PRN Sharma Covert, MD       naproxen (NAPROSYN) tablet 500 mg  500 mg Oral BID PRN Sharma Covert, MD   500 mg at 04/09/20 1442   nicotine (NICODERM CQ - dosed in mg/24 hours) patch 21 mg  21 mg Transdermal Daily Sharma Covert, MD   21 mg at 04/10/20 0928   ondansetron (ZOFRAN-ODT) disintegrating tablet 4 mg  4 mg Oral Q6H PRN Sharma Covert, MD       Oxcarbazepine (TRILEPTAL) tablet 300 mg  300 mg Oral BID Caroline Sauger, NP   300 mg at 04/10/20 0926   pantoprazole (PROTONIX) EC tablet 40 mg  40 mg Oral Daily Caroline Sauger, NP   40 mg at 04/10/20 7893   prazosin (MINIPRESS) capsule 1 mg  1 mg Oral QHS Sharma Covert, MD   1 mg at 04/09/20 2141   propranolol (INDERAL) tablet 10 mg  10 mg Oral BID Caroline Sauger, NP   10 mg at 04/10/20 8101   QUEtiapine (SEROQUEL) tablet 200 mg  200 mg Oral QHS Sharma Covert, MD   200 mg at 04/09/20 2141   topiramate (TOPAMAX) tablet 100 mg  100 mg Oral QHS Sharma Covert, MD   100 mg at 04/09/20 2141    Lab Results:  Results for orders placed or performed during the hospital encounter of 04/09/20 (from the past 48 hour(s))  T3     Status: None   Collection Time: 04/09/20  6:47 PM  Result Value Ref Range   T3, Total 84 71 - 180 ng/dL    Comment: (NOTE) Performed At:  [facility]  [place], [place] [id number] Rush Farmer MD [id number]   T4     Status: None   Collection Time: 04/09/20  6:47 PM  Result Value Ref Range   T4, Total 6.2 4.5 - 12.0 ug/dL    Comment: (NOTE) Performed At: [facility] [place], [place] [id number] Rush Farmer MD [id number]     Blood Alcohol level:  Lab Results  Component Value Date   ETH <10 02/24/2020   [facility]  02/13/2010    <5        LOWEST DETECTABLE LIMIT  FOR SERUM ALCOHOL IS 5 mg/dL FOR MEDICAL PURPOSES ONLY    Metabolic Disorder Labs: Lab Results  Component Value Date   HGBA1C 5.7 (H) 04/08/2020   MPG 116.89 04/08/2020   No results found for: PROLACTIN Lab Results  Component Value Date   CHOL 252 (H) 04/08/2020   TRIG 96 04/08/2020   HDL 50 04/08/2020   CHOLHDL 5.0 04/08/2020   VLDL 19 04/08/2020   LDLCALC 183 (H) 04/08/2020   LDLCALC 115 (H) 07/03/2007    Physical Findings: AIMS:  , ,  ,  ,    CIWA:    COWS:  COWS Total Score: 7  Musculoskeletal: Strength & Muscle Tone: within normal limits Gait & Station: normal Patient leans: N/A  Psychiatric Specialty Exam: Physical Exam HENT:     Head: Normocephalic and atraumatic.  Pulmonary:     Effort: Pulmonary effort is normal.  Neurological:     General: No focal deficit present.     Mental Status: She is alert and oriented to person, place, and time.     Review of Systems  Blood pressure 94/69, pulse 78, temperature 98.2 F (36.8 C), temperature source Oral, resp. rate 20, height 5' 1.5" (1.562 m), weight 78.7 kg, last menstrual period 06/14/2012, SpO2 99 %.Body mass index is 32.25 kg/m.  General Appearance: Disheveled  Eye Contact:  Fair  Speech:  Normal Rate  Volume:  Decreased  Mood:  Depressed and Dysphoric  Affect:  Congruent  Thought Process:  Coherent and Descriptions of Associations: Intact  Orientation:  Full (Time, Place, and Person)  Thought Content:  Logical  Suicidal Thoughts:  No  Homicidal Thoughts:  No  Memory:  Immediate;   Fair Recent;   Fair Remote;   Fair  Judgement:  Intact  Insight:  Fair  Psychomotor Activity:  Psychomotor Retardation  Concentration:  Concentration: Fair and Attention Span: Fair  Recall:  [employer] of Knowledge:  Fair  Language:  Good  Akathisia:  Negative  Handed:  Right  AIMS (if indicated):     Assets:  Desire for Improvement Resilience  ADL's:  Intact  Cognition:  WNL  Sleep:  Number of Hours: 4.75      Treatment Plan Summary: Daily contact with patient to assess and evaluate symptoms and progress in treatment, Medication management and Plan : Patient is seen and examined.  Patient is a 49 year old female with the above-stated past psychiatric history who is seen in follow-up.   Diagnosis: 1.  History of bipolar disorder 2.  Opiate dependence 3.  Opiate withdrawal 4.  Benzodiazepine use disorder 5.  Rheumatoid arthritis 6.  Psoriatic arthritis 7.  Migraine headaches  Pertinent findings on examination today: 1.  Improved mood by approximately 40 to 50%. 2.  She stated that she did sleep better, although was recorded she only slept 4.5 hours. 3.  Withdrawal symptoms include headache, nausea and pain. 4.  T3 and T4 are normal with low normal TSH. 5.  Diet-controlled diabetes mellitus 6.  Blood pressure is mildly low.  Plan: 1.  Continue folic acid 1 mg p.o. daily for nutritional supplementation. 2.  Continue Celexa 40 mg p.o. daily for mood and anxiety. 3.  Continue Flexeril 10 mg p.o. 3 times daily as needed muscle spasms. 4.  Continue opiate detox protocol and their components. 5.  Continue gabapentin 400 mg p.o. 3 times daily for chronic pain and anxiety. 6.  Continue lorazepam 1 mg p.o. every 6 hours as needed CIWA greater than 10. 7.  Continue Trileptal 300 mg p.o. twice daily for mood stability. 8.  Continue Protonix 40 mg p.o. daily for nutritional supplementation. 9.  Continue prazosin 1 mg p.o. nightly for nightmares and flashbacks. 10.  Stop Inderal 11.  Increase Seroquel to 400 mg p.o. nightly for mood stability and sleep. 12.  Continue topiramate 100 mg p.o. nightly for migraine headache prevention. 13.  Disposition planning-in progress.  Sharma Covert, MD 04/10/2020, 11:59 AM

## 2020-04-11 DIAGNOSIS — F314 Bipolar disorder, current episode depressed, severe, without psychotic features: Secondary | ICD-10-CM | POA: Diagnosis not present

## 2020-04-11 DIAGNOSIS — F1120 Opioid dependence, uncomplicated: Secondary | ICD-10-CM

## 2020-04-11 DIAGNOSIS — F4310 Post-traumatic stress disorder, unspecified: Secondary | ICD-10-CM | POA: Diagnosis not present

## 2020-04-11 MED ORDER — GABAPENTIN 400 MG PO CAPS
400.0000 mg | ORAL_CAPSULE | Freq: Three times a day (TID) | ORAL | Status: DC
  Administered 2020-04-11 – 2020-04-16 (×18): 400 mg via ORAL
  Filled 2020-04-11 (×23): qty 1

## 2020-04-11 MED ORDER — HYDROXYZINE HCL 25 MG PO TABS
25.0000 mg | ORAL_TABLET | ORAL | Status: DC | PRN
  Administered 2020-04-11 – 2020-04-17 (×11): 25 mg via ORAL
  Filled 2020-04-11 (×12): qty 1

## 2020-04-11 MED ORDER — LORAZEPAM 1 MG PO TABS
1.0000 mg | ORAL_TABLET | Freq: Three times a day (TID) | ORAL | Status: DC | PRN
  Administered 2020-04-11 – 2020-04-16 (×12): 1 mg via ORAL
  Filled 2020-04-11 (×13): qty 1

## 2020-04-11 NOTE — [facility] Group Notes (Signed)
Adult Psychoeducational Group Note  Date:  04/11/2020 Time:  11:03 AM  Group Topic/Focus:  Goals Group:   The focus of this group is to help patients establish daily goals to achieve during treatment and discuss how the patient can incorporate goal setting into their daily lives to aide in recovery.  Participation Level:  Active  Participation Quality:  Appropriate  Affect:  Appropriate  Cognitive:  Appropriate  Insight: Appropriate  Engagement in Group:  Engaged  Modes of Intervention:  Discussion, Education, Exploration and Support  Additional Comments:  Pt rates her energy level at a 3/10. States she is here because she "lost my entire family". States she was feeling suicidal. [name] her goal today is to write down positive affirmations about herself; to go to breakfast lunch and dinner because she hasn't been eating properly.  [name] A 04/11/2020, 11:03 AM

## 2020-04-11 NOTE — Progress Notes (Signed)
Pt reports having a fall a month ago at her house where she had tripped over a rug. Pt said that she fell face first and hurt the left side of her head. Pt said that she had some numbness on that side of her head and some secretions that were coming out of her eyes for several days. Pt was asked if she ever went to the ER for evaluation and to have some scans done. Pt said that she never went. Pt said that she continues to have headaches and also has a hx of migraine headaches. Pt made aware that she has scheduled topamax to help with the migraine headaches which is scheduled for bedtime. Active listening, reassurance, and support provided. Pt encouraged to inform MD in the AM for possible evaluation and that this nurse would pass it on to oncoming nurse in the AM.

## 2020-04-11 NOTE — Progress Notes (Signed)
[facility] MD Progress Note  04/11/2020 2:57 PM [name]  MRN:  [id number]  Subjective: [name] reports, "I'm not okay. I'm having a lot of anxiety today. I don't the reason the doctor cut down on my Ativan. I try to be positive & active in the group sessions, but the anxiety gets in the way. When it hits me, I feel like I'm having a heart attack. I really don't know what to do to feel better. I know I was not abusing my klonopin".  Objective: Patient is a 49 year old female with a past psychiatric history significant for bipolar disorder opiate dependence and benzodiazepine use disorder who was admitted on 04/08/2020 with suicidal ideation. [name] is seen, chart reviewed. The chart findings discussed with the treatment team. She presents alert, oriented & aware of situation. She is visible on the unit, attending group sessions. She is complaining of high anxiety levels. She rates her anxiety #10 today. She is wondering the reason she cannot be allowed to continue her klonopin as she says, klonopin is not like Xanax because she did not get high on it. [name] seem to not understand that she may be misusing her medications. Her fiance had called, talked to the attending psychiatrist, requested to not allow [name] to stay on Seroquel for the concerns that she was abusing this medications prior to being hospitalized. [name] on the other hand is adamant about staying on Seroquel at bedtime. She states that there was a time she was unknowingly mixing Seroquel & opioid together & they made her loopy. However, she says she is no longer on opioid drugs, that she should not have that problem any longer. To help with better management of her anxiety, she is in agreement to increase gabapentin 400 mg to qid with addition of Vistaril 25 mg po qid prn for anxiety. She denies any SIHI, AVH, delusional thoughts or paranoia. She does not appear to be responding to any internal stimuli.  Principal Problem: Severe bipolar  I disorder, current or most recent episode depressed ([place])  Diagnosis: Principal Problem:   Severe bipolar I disorder, current or most recent episode depressed ([place]) Active Problems:   TOBACCO ABUSE   Generalized abdominal pain   Sleep deprivation   Opiate dependence ([place])   Bipolar 1 disorder (H[facility])   PTSD (post-traumatic stress disorder)  Total Time spent with patient: 15 minutes  Past Psychiatric History: See admission H&P  Past Medical History:  Past Medical History:  Diagnosis Date  . Anxiety   . Arthritis    left knee  . Astigmatism    both eyes  . Cancer ([place])    melanoma removed from back   . Cataract    left eye  . Depression   . Diverticulosis   . Erosive esophagitis   . Family history of ovarian cancer   . Family history of prostate cancer   . Family history of uterine cancer   . 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 GERD (gastroesophageal reflux disease)   . H/O urinary infection   . Headache   . History of hiatal hernia   . IBS (irritable bowel syndrome)    no current med.  . Jaundice   . MRSA (methicillin resistant Staphylococcus aureus)    hospitalized for 48 hours in Dec 2016  . Staph infection    "in my blood"    Past Surgical History:  Procedure Laterality Date  . CHOLECYSTECTOMY  08/02/2012  Procedure: LAPAROSCOPIC CHOLECYSTECTOMY WITH INTRAOPERATIVE CHOLANGIOGRAM;  Surgeon: Rolm Bookbinder, MD;  Location: [place];  Service: General;  Laterality: N/A;  . COLONOSCOPY WITH PROPOFOL  04/09/2014  . COLONOSCOPY WITH PROPOFOL N/A 07/14/2016   Procedure: COLONOSCOPY WITH PROPOFOL;  Surgeon: Milus Banister, MD;  Location: [facility];  Service: Endoscopy;  Laterality: N/A;  . DILATION AND CURETTAGE OF UTERUS     w/ hysteroscopy to remove cyst  . ESOPHAGOGASTRODUODENOSCOPY N/A 12/02/2017   Procedure: ESOPHAGOGASTRODUODENOSCOPY (EGD);  Surgeon: Carol Ada, MD;  Location: [place];  Service: Endoscopy;   Laterality: N/A;  . ESOPHAGOGASTRODUODENOSCOPY (EGD) WITH PROPOFOL  04/09/2014  . ESOPHAGOGASTRODUODENOSCOPY (EGD) WITH PROPOFOL N/A 07/14/2016   Procedure: ESOPHAGOGASTRODUODENOSCOPY (EGD) WITH PROPOFOL;  Surgeon: Milus Banister, MD;  Location: [facility];  Service: Endoscopy;  Laterality: N/A;  . HYSTEROSCOPY WITH D & C  06/22/2012   Procedure: DILATATION AND CURETTAGE /HYSTEROSCOPY;  Surgeon: Lahoma Crocker, MD;  Location: [facility];  Service: Gynecology;  Laterality: N/A;  . KNEE ARTHROSCOPY  2011   left  . ORIF TOE FRACTURE Left 11/05/2014   Procedure: OPEN REDUCTION INTERNAL FIXATION (ORIF) LEFT FIFTH METATARSAL (TOE) FRACTURE WITH CALCANEAL BONE GRAFT;  Surgeon: Dorna Leitz, MD;  Location: [place];  Service: Orthopedics;  Laterality: Left;  . WISDOM TOOTH EXTRACTION     Family History:  Family History  Problem Relation Age of Onset  . Heart disease Father   . Stroke Father   . Heart attack Father   . Skin cancer Father   . Diabetes Father   . Uterine cancer Mother 60  . COPD Mother   . Cancer - Colon Maternal Grandfather        mets to bone  . Stomach cancer Paternal Grandmother   . Cervical cancer Sister        dx between 33-33  . Migraines Sister   . Prostate cancer Maternal Uncle 59       mets to bone  . Ovarian cancer Paternal Aunt   . Heart attack Paternal Grandfather   . Skin cancer Paternal Aunt    Family Psychiatric  History: See admission H&P  Social History:  Social History   Substance and Sexual Activity  Alcohol Use No  . Alcohol/week: 0.0 standard drinks     Social History   Substance and Sexual Activity  Drug Use No  . Types: Marijuana   Comment: history of use    Social History   Socioeconomic History  . Marital status: Significant Other    Spouse name: Not on file  . Number of children: 0  . Years of education: College  . Highest education level: Not on file  Occupational History  . Occupation: Disability  Tobacco  Use  . Smoking status: Current Every Day Smoker    Packs/day: 1.00    Years: 33.00    Pack years: 33.00    Types: Cigarettes  . Smokeless tobacco: Never Used  . Tobacco comment: form given 02-04-16   Substance and Sexual Activity  . Alcohol use: No    Alcohol/week: 0.0 standard drinks  . Drug use: No    Types: Marijuana    Comment: history of use  . Sexual activity: Yes    Birth control/protection: Post-menopausal  Other Topics Concern  . Not on file  Social History Narrative   Lives at home w/ significant other   Right-handed   Caffeine: coffee "all day"   Social Determinants of Health   Financial Resource Strain:   .  Difficulty of Paying Living Expenses:   Food Insecurity:   . Worried About Charity fundraiser in the Last Year:   . Arboriculturist in the Last Year:   Transportation Needs:   . Film/video editor (Medical):   [name] Lack of Transportation (Non-Medical):   Physical Activity:   . Days of Exercise per Week:   . Minutes of Exercise per Session:   Stress:   . Feeling of Stress :   Social Connections:   . Frequency of Communication with Friends and Family:   . Frequency of Social Gatherings with Friends and Family:   . Attends Religious Services:   . Active Member of Clubs or Organizations:   . Attends Archivist Meetings:   [name] Marital Status:    Additional Social History:   Sleep: Fair  Appetite:  Fair  Current Medications: Current Facility-Administered Medications  Medication Dose Route Frequency Provider Last Rate Last Admin  . acetaminophen (TYLENOL) tablet 650 mg  650 mg Oral Q6H PRN Sharma Covert, MD      . alum & mag hydroxide-simeth (MAALOX/MYLANTA) 200-200-20 MG/5ML suspension 30 mL  30 mL Oral Q4H PRN Sharma Covert, MD      . citalopram (CELEXA) tablet 40 mg  40 mg Oral Daily Caroline Sauger, NP   40 mg at 04/11/20 0745  . cyclobenzaprine (FLEXERIL) tablet 10 mg  10 mg Oral TID PRN Caroline Sauger, NP   10 mg at  04/11/20 0749  . dicyclomine (BENTYL) tablet 20 mg  20 mg Oral Q6H PRN Sharma Covert, MD      . folic acid (FOLVITE) tablet 1 mg  1 mg Oral Daily Caroline Sauger, NP   1 mg at 04/11/20 0745  . gabapentin (NEURONTIN) capsule 400 mg  400 mg Oral TID PC & HS Yeilin Zweber I, NP      . hydrOXYzine (ATARAX/VISTARIL) tablet 25 mg  25 mg Oral Q4H PRN Kristain Hu I, NP      . ibuprofen (ADVIL) tablet 800 mg  800 mg Oral TID Caroline Sauger, NP   800 mg at 04/11/20 1159  . loperamide (IMODIUM) capsule 2-4 mg  2-4 mg Oral PRN Sharma Covert, MD   2 mg at 04/09/20 2144  . LORazepam (ATIVAN) tablet 1 mg  1 mg Oral Q8H PRN Sharma Covert, MD      . magnesium hydroxide (MILK OF MAGNESIA) suspension 30 mL  30 mL Oral Daily PRN Sharma Covert, MD      . naproxen (NAPROSYN) tablet 500 mg  500 mg Oral BID PRN Sharma Covert, MD   500 mg at 04/11/20 1039  . nicotine (NICODERM CQ - dosed in mg/24 hours) patch 21 mg  21 mg Transdermal Daily Sharma Covert, MD   21 mg at 04/11/20 0747  . ondansetron (ZOFRAN-ODT) disintegrating tablet 4 mg  4 mg Oral Q6H PRN Sharma Covert, MD      . Oxcarbazepine (TRILEPTAL) tablet 300 mg  300 mg Oral BID Caroline Sauger, NP   300 mg at 04/11/20 0745  . pantoprazole (PROTONIX) EC tablet 40 mg  40 mg Oral Daily Caroline Sauger, NP   40 mg at 04/11/20 0744  . prazosin (MINIPRESS) capsule 1 mg  1 mg Oral QHS Sharma Covert, MD   1 mg at 04/10/20 2101  . QUEtiapine (SEROQUEL) tablet 300 mg  300 mg Oral QHS Sharma Covert, MD   300 mg at 04/10/20  2102  . topiramate (TOPAMAX) tablet 100 mg  100 mg Oral QHS Sharma Covert, MD   100 mg at 04/10/20 2102   Lab Results:  Results for orders placed or performed during the hospital encounter of 04/09/20 (from the past 48 hour(s))  T3     Status: None   Collection Time: 04/09/20  6:47 PM  Result Value Ref Range   T3, Total 84 71 - 180 ng/dL    Comment: (NOTE) Performed At: [facility] [place], [place] [id number] Rush Farmer MD [id number]   T4     Status: None   Collection Time: 04/09/20  6:47 PM  Result Value Ref Range   T4, Total 6.2 4.5 - 12.0 ug/dL    Comment: (NOTE) Performed At: [facility] [place], [place] [id number] Rush Farmer MD [id number]    Blood Alcohol level:  Lab Results  Component Value Date   ETH <10 02/24/2020   [facility]  02/13/2010    <5        LOWEST DETECTABLE LIMIT FOR SERUM ALCOHOL IS 5 mg/dL FOR MEDICAL PURPOSES ONLY    Metabolic Disorder Labs: Lab Results  Component Value Date   HGBA1C 5.7 (H) 04/08/2020   MPG 116.89 04/08/2020   No results found for: PROLACTIN Lab Results  Component Value Date   CHOL 252 (H) 04/08/2020   TRIG 96 04/08/2020   HDL 50 04/08/2020   CHOLHDL 5.0 04/08/2020   VLDL 19 04/08/2020   LDLCALC 183 (H) 04/08/2020   LDLCALC 115 (H) 07/03/2007   Physical Findings: AIMS:  , ,  ,  ,    CIWA:  CIWA-Ar Total: 13 COWS:  COWS Total Score: 7  Musculoskeletal: Strength & Muscle Tone: within normal limits Gait & Station: normal Patient leans: N/A  Psychiatric Specialty Exam: Physical Exam Vitals and nursing note reviewed.  HENT:     Head: Normocephalic and atraumatic.     Nose: Nose normal.     Mouth/Throat:     Pharynx: Oropharynx is clear.  Eyes:     Pupils: Pupils are equal, round, and reactive to light.  Cardiovascular:     Rate and Rhythm: Normal rate.     Pulses: Normal pulses.  Pulmonary:     Effort: Pulmonary effort is normal.  Genitourinary:    Comments: Deferred Musculoskeletal:        General: Normal range of motion.     Cervical back: Normal range of motion.  Skin:    General: Skin is warm and dry.  Neurological:     General: No focal deficit present.     Mental Status: She is alert and oriented to person, place, and time.     Review of Systems  Constitutional: Negative for chills, diaphoresis and fever.  HENT:  Negative for congestion, rhinorrhea, sneezing and sore throat.   Eyes: Negative for discharge.  Respiratory: Negative for cough, chest tightness, shortness of breath and wheezing.   Cardiovascular: Negative for chest pain and palpitations.  Gastrointestinal: Negative for diarrhea, nausea and vomiting.  Endocrine: Negative for cold intolerance.  Genitourinary: Negative for difficulty urinating.  Musculoskeletal: Negative for arthralgias and myalgias.  Skin: Negative.   Allergic/Immunologic: Negative for environmental allergies and food allergies.       Allergies: Codeine  Neurological: Negative for dizziness, tremors, seizures, syncope, light-headedness and headaches.  Psychiatric/Behavioral: Positive for decreased concentration and dysphoric mood (Crying spells). Negative for agitation, behavioral problems, confusion, hallucinations, self-injury, sleep disturbance and suicidal ideas.  The patient is nervous/anxious. The patient is not hyperactive.     Blood pressure 111/79, pulse 92, temperature 97.7 F (36.5 C), temperature source Oral, resp. rate 20, height 5' 1.5" (1.562 m), weight 78.7 kg, last menstrual period 06/14/2012, SpO2 99 %.Body mass index is 32.25 kg/m.  General Appearance: Casual and Fairly Groomed  Eye Contact:  Fair  Speech:  Normal Rate  Volume:  Decreased  Mood:  Depressed and Dysphoric  Affect:  Congruent  Thought Process:  Coherent and Descriptions of Associations: Intact  Orientation:  Full (Time, Place, and Person)  Thought Content:  Logical  Suicidal Thoughts:  No  Homicidal Thoughts:  No  Memory:  Immediate;   Fair Recent;   Fair Remote;   Fair  Judgement:  Intact  Insight:  Fair  Psychomotor Activity:  Psychomotor Retardation  Concentration:  Concentration: Fair and Attention Span: Fair  Recall:  [employer] of Knowledge:  Fair  Language:  Good  Akathisia:  Negative  Handed:  Right  AIMS (if indicated):     Assets:  Desire for Improvement Resilience   ADL's:  Intact  Cognition:  WNL  Sleep:  Number of Hours: 4.75   Treatment Plan Summary: Daily contact with patient to assess and evaluate symptoms and progress in treatment, Medication management and Plan : Patient is seen and examined.  Patient is a 49 year old female with the above-stated past psychiatric history who is seen in follow-up.   Diagnosis: 1.  History of bipolar disorder 2.  Opiate dependence 3.  Opiate withdrawal 4.  Benzodiazepine use disorder 5.  Rheumatoid arthritis 6.  Psoriatic arthritis 7.  Migraine headaches  Pertinent findings on examination today: 1.  Improved mood by approximately 40 to 50%. 2.  She stated that she did sleep better, although was recorded she only slept 4.75 hours. 3.  Withdrawal symptoms include headache, nausea and pain. 4.  T3 and T4 are normal with low normal TSH. 5.  Diet-controlled diabetes mellitus 6.  Blood pressure is normal 111/79, pulse 92.  Plan: 1.  - Continue folic acid 1 mg p.o. daily for nutritional supplementation. 2.  - Continue Celexa 40 mg p.o. daily for mood and anxiety. 3.  - Continue Flexeril 10 mg p.o. 3 times daily as needed muscle spasms. 4.  - Continue opiate detox protocol and their components. 5.  - Increased gabapentin from 400 mg p.o. tid to qid daily for chronic pain & anxiety. 6.  - Continue lorazepam 1 mg p.o. every 6 hours as needed CIWA greater than 10. 7.  - Continue Trileptal 300 mg p.o. twice daily for mood stability. 8.  - Continue Protonix 40 mg p.o. daily for nutritional supplementation. 9.  - Continue prazosin 1 mg p.o. nightly for nightmares and flashbacks. 10. - Stopped Inderal 11.  - Continue Seroquel 300 mg p.o. nightly for mood stability & sleep. 12.  - Continue topiramate 100 mg p.o. nightly for migraine headache prevention. 13.  - Disposition planning-in progress. 14   - Initiated Vistaril 25 mg po qid prn for anxiety.  Lindell Spar, NP, PMHNP, FNP-BC 04/11/2020, 2:57 PMPatient ID:  Gus Rankin, female   DOB: 16-Dec-1970, 49 y.o.   MRN: [id number]

## 2020-04-11 NOTE — [facility] Group Notes (Signed)
Adult Psychoeducational Group Note  Date:  04/11/2020 Time:  2:37 PM  Group Topic/Focus:  Identifying Needs:   The focus of this group is to help patients identify their personal needs that have been historically problematic and identify healthy behaviors to address their needs.  Participation Level:  Active  Participation Quality:  Appropriate  Affect:  Appropriate  Cognitive:  Appropriate  Insight: Improving  Engagement in Group:  Engaged  Modes of Intervention:  Discussion, Education, Exploration and Support  Additional Comments:  Pt rates her energy as a 3/10. States that she needs guidance and direction.Was able to identify some of her needs in this group   Paulino Rily 04/11/2020, 2:37 PM

## 2020-04-11 NOTE — Progress Notes (Signed)
   04/11/20 2242  COVID-19 Daily Checkoff  Have you had a fever (temp > 37.80C/100F)  in the past 24 hours?  No  If you have had runny nose, nasal congestion, sneezing in the past 24 hours, has it worsened? No  COVID-19 EXPOSURE  Have you traveled outside the state in the past 14 days? No  Have you been in contact with someone with a confirmed diagnosis of COVID-19 or PUI in the past 14 days without wearing appropriate PPE? No  Have you been living in the same home as a person with confirmed diagnosis of COVID-19 or a PUI (household contact)? No  Have you been diagnosed with COVID-19? No

## 2020-04-11 NOTE — Progress Notes (Signed)
Pt presented as cheerful with bright affect this morning.  Pt said she has been attending groups and meeting with her social worker today, and although beneficial, it has been emotionally challenging bringing up intense emotions from pt's past.  Pt said her anxiety has increased throughout the day and prn ativan given. Pt is cooperative and pleasant with staff and patients.  Pt remains safe on the unit with q 15 min safety checks in place.

## 2020-04-11 NOTE — Progress Notes (Signed)
[facility] Group Notes:  (Nursing/MHT/Case Management/Adjunct)  Date:  04/11/2020  Time:  2030  Type of Therapy:  wrap up group  Participation Level:  Active  Participation Quality:  Appropriate, Attentive, Sharing and Supportive  Affect:  Appropriate  Cognitive:  Appropriate  Insight:  Improving  Engagement in Group:  Engaged  Modes of Intervention:  Clarification, Education and Support  Summary of Progress/Problems: Positive thinking and positive change were discussed.   Shellia Cleverly 04/11/2020, 10:23 PM
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
## 2020-04-11 NOTE — Progress Notes (Signed)
   04/11/20 2245  Psych Admission Type (Psych Patients Only)  Admission Status Voluntary  Psychosocial Assessment  Patient Complaints Anxiety  Eye Contact Fair  Facial Expression Animated  Affect Appropriate to circumstance  Speech Logical/coherent  Interaction Assertive  Appearance/Hygiene Unremarkable  Behavior Characteristics Appropriate to situation  Mood Pleasant  Thought Process  Coherency WDL  Content WDL  Delusions None reported or observed  Perception WDL  Hallucination None reported or observed  Judgment Poor  Confusion None  Danger to Self  Current suicidal ideation? Denies  Self-Injurious Behavior No self-injurious ideation or behavior indicators observed or expressed   Agreement Not to Harm Self Yes  Description of Agreement verbally contracts for safety  Danger to Others  Danger to Others None reported or observed

## 2020-04-12 DIAGNOSIS — F1120 Opioid dependence, uncomplicated: Secondary | ICD-10-CM | POA: Diagnosis not present

## 2020-04-12 DIAGNOSIS — F314 Bipolar disorder, current episode depressed, severe, without psychotic features: Secondary | ICD-10-CM | POA: Diagnosis not present

## 2020-04-12 DIAGNOSIS — F4310 Post-traumatic stress disorder, unspecified: Secondary | ICD-10-CM | POA: Diagnosis not present

## 2020-04-12 MED ORDER — QUETIAPINE FUMARATE 400 MG PO TABS: 400.0000 mg | ORAL_TABLET | Freq: Every day | ORAL | Status: DC

## 2020-04-12 MED ORDER — QUETIAPINE FUMARATE 300 MG PO TABS
300.0000 mg | ORAL_TABLET | Freq: Every day | ORAL | Status: DC
  Administered 2020-04-12 – 2020-04-14 (×3): 300 mg via ORAL
  Filled 2020-04-12 (×4): qty 1

## 2020-04-12 NOTE — Progress Notes (Signed)
   04/12/20 0800  Psych Admission Type (Psych Patients Only)  Admission Status Voluntary  Psychosocial Assessment  Patient Complaints Anxiety  Eye Contact Brief  Facial Expression Worried  Affect Depressed  Speech Unremarkable  Interaction Assertive  Motor Activity Slow  Appearance/Hygiene Unremarkable  Behavior Characteristics Anxious  Mood Anxious  Thought Process  Coherency WDL  Content WDL  Delusions None reported or observed  Perception WDL  Hallucination None reported or observed  Judgment Poor  Confusion None  Danger to Self  Current suicidal ideation? Denies  Self-Injurious Behavior No self-injurious ideation or behavior indicators observed or expressed   Agreement Not to Harm Self Yes  Description of Agreement verbal  Danger to Others  Danger to Others None reported or observed

## 2020-04-12 NOTE — Progress Notes (Signed)
   04/12/20 2215  Psych Admission Type (Psych Patients Only)  Admission Status Voluntary  Psychosocial Assessment  Patient Complaints Anxiety  Eye Contact Fair  Facial Expression Anxious  Affect Appropriate to circumstance  Speech Logical/coherent  Interaction Assertive  Motor Activity Other (Comment) (WDL)  Appearance/Hygiene Unremarkable  Behavior Characteristics Appropriate to situation  Mood Anxious;Pleasant  Thought Process  Coherency WDL  Content WDL  Delusions None reported or observed  Perception WDL  Hallucination None reported or observed  Judgment Poor  Confusion None  Danger to Self  Current suicidal ideation? Denies  Self-Injurious Behavior No self-injurious ideation or behavior indicators observed or expressed   Agreement Not to Harm Self Yes  Description of Agreement verbal  Danger to Others  Danger to Others None reported or observed

## 2020-04-12 NOTE — [facility] Group Notes (Signed)
Date:  04/12/2020 Time:  9:00am-9:45am  Group Topic/Focus: PROGRESSIVE RELAXATION. A group where deep breathing is taught and tensing and relaxation muscle groups is used. Imagery is used as well.  Pts are asked to imagine 3 pillars that hold them up when they are not able to hold themselves up.  Participation Level:  Active  Participation Quality:  Appropriate  Affect:  Appropriate  Cognitive:  Oriented  Insight: Improving  Engagement in Group:  Engaged  Modes of Intervention:  Activity, Discussion, Education, and Support  Additional Comments:  Rates her energy level at a 5/10. States her mother, brothers and her Boyfriend hold her up when she is unable to hold herself up. States he has learned to not let obsticles get in the way of your goals  Paulino Rily 04/12/2020

## 2020-04-12 NOTE — BHH Counselor (Signed)
Clinical Social Work Note  Notes from social work contact with fiance:  Fiance states he has been taking care of patient over 13 years.  He expressed that he has asked for a sit-down meeting with the doctor because he wants to tell what medications she needs.  She will overdose on her own medicines.  She will take them all at one time if she misses an earlier dose in the day, doubling up for instance. She will also stop taking her medicines at times.  He states that this time, the patient was taking too much of her medication.    He has tried to lock the medicines up, but then she called the law on him claiming he was chasing her.  At one point he even went to jail for 24 hours after she called them.   CSW advised that we would encourage him to keep the medications locked up, and he stated he will do that and will administer the medication when due.  CSW agreed to inform the patient that we have asked fiance to do this.  CSW read out the list of medications to him and he wrote them down.   Fiance states that the patient has never had any nightmares or waking episodes at night, so he does not understand her being on the [place].  He still is wanting to have a sit-down meeting with the doctor because he feels that the patient is telling things that are not true and this is negatively impacting her prescriptions.    Selmer Dominion, LCSW 04/12/2020, 10:04 AM

## 2020-04-12 NOTE — [facility] Group Notes (Signed)
[facility] LCSW Group Therapy Note  04/12/2020    Type of Therapy and Topic:  Group Therapy:  Adding Supports Including Yourself  Participation Level:  Active   Description of Group:   Patients in this group were introduced to the concept that additional supports including self-support are an essential part of recovery.  Patients listed what supports they believe they need to add to their lives to achieve their goals at discharge, and they listed such things as therapist, family, doctor, support groups, 12-step groups and service animals.   A song entitled "My Own Hero" was played and a group discussion ensued in which patients stated they could relate to the song and it inspired them to realize they have be willing to help themselves in order to succeed, because other people cannot achieve sobriety or stability for them.  "Fight For It" was played, then "I Am Enough" to encourage patients.  They discussed the impact on them and how they must remain convinced that their lives are worth the effort it takes to become sober and/or stable.  Therapeutic Goals: 1)  demonstrate the importance of being a key part of one's own support system 2)  discuss various available supports 3)  encourage patient to use music as part of their self-support and focus on goals 4)  elicit ideas from patients about supports that need to be added   Summary of Patient Progress:  The patient expressed that her girlfriends are healthy supports but her fiance often isolates her and does not allow her to see them, or makes things so difficult that they don't want to be around him so don't come around to see her as a result.  She stated her fiance is healthy in some ways and unhealthy in some ways for her.  She shared quite a bit about him, saying when she was withdrawing from opiates and was sick, he cleaned up after her.  However, he does isolate her from family and friends, is living in her house although she has told him to leave, is a  "severe alcoholic," and hid her mental health medicines when he thought she was taking too much.  He also took videos of her vomiting and sent them to friends, which she felt was very unhealthy despite the fact that in some ways he was helping her through it.  She expressed feelings of hopelessness about getting him out of her house which she inherited.  People in the group talked to her about involving the police, etc. in order to get him to leave.  She did not seem ready to do this.  Other unhealthy supports include her cousin.  She did really appreciate the songs and said she realizes that she needs to start standing up for herself, because nobody else can do it for her.  Therapeutic Modalities:   Motivational Interviewing Activity  Berlin Hun Grossman-Orr  8:23 AM

## 2020-04-12 NOTE — Plan of Care (Signed)
  Problem: Education: Goal: Utilization of techniques to improve thought processes will improve Outcome: Adequate for Discharge Goal: Knowledge of the prescribed therapeutic regimen will improve Outcome: Adequate for Discharge   Problem: Activity: Goal: Interest or engagement in leisure activities will improve Outcome: Adequate for Discharge Goal: Imbalance in normal sleep/wake cycle will improve Outcome: Adequate for Discharge  D: Patient appears anxious and apprehensive. Her main physical complaint today is a migraine headache. She denies any thoughts of self harm. She rates her depression as an 8; anxiety as a 9; and her hopelessness as a 10. She reports some withdrawal symptoms as agitation, nausea, and irritability. Patient is a possible discharge for tomorrow.  A: Continue to monitor medication management and MD orders.  Safety checks completed every 15 minutes per protocol.  Offer support and encouragement as needed.  R: Patient is receptive to staff; her behavior is appropriate.

## 2020-04-12 NOTE — Progress Notes (Signed)
[facility] MD Progress Note  04/12/2020 1:17 PM [name]  MRN:  [id number]  Subjective: Belva reports, "I'm not good today. I still have bad depression & anxiety. I'm not ready to be discharged yet. I just started today today work on myself to deal with issues that I have not been dealing. I want to start grieving for my mother's death. My fiancee wants me to come home tomorrow, but I'm not ready for discharge yet".  Objective: Patient is a 49 year old female with a past psychiatric history significant for bipolar disorder opiate dependence and benzodiazepine use disorder who was admitted on 04/08/2020 with suicidal ideation. [name] is seen, chart reviewed. The chart findings discussed with the treatment team. She presents alert, oriented & aware of situation. She is visible on the unit, attending group sessions. She is complaining of  anxiety & feeling depressed still. She rates her anxiety #10 today again as she did yesterday & depression #7. She is still wondering the reason she cannot be allowed to continue her klonopin as she says, klonopin is not like Xanax because she did not get high on it. At this time, she remains medication focused mostly Klonopin & Seroquel. She is requesting for her Seroquel to be increased to 400 mg for her to sleep at night. She says she did not sleep at all last. However, documentation indicated she slept for about 6 hours last night. This provider declines to increased Seroquel as she was instructed that Seroquel is not a sleep medication.  [name] seem to not understand that she may be misusing her medications. Her fiance had called, talked to the attending psychiatrist previously, requested to not allow [name] to stay on Seroquel for the concerns that she was abusing this medications prior to being hospitalized. [name] on the other hand is adamant about staying on Seroquel at bedtime. She states that there was a time she was unknowingly mixing Seroquel & opioid  together & they made her loopy. However, she says she is no longer on opioid drugs, that she should not have that problem any longer. To help with better management of her anxiety, she was in agreement yesterday to increase gabapentin 400 mg to qid with addition of Vistaril 25 mg po qid prn for anxiety. Today, she is encouraged to apply her learned coping skills to deal with some of the discomforts she is experiencing. Her focus today is not wanting to be discharged anytime soon as she feels she is not yet ready to be discharged. She says she needed more time to learn how to deal with the grief of losing her mother & grand-mother back to back. She denies any SIHI, AVH, delusional thoughts or paranoia. She does not appear to be responding to any internal stimuli.  Principal Problem: Severe bipolar I disorder, current or most recent episode depressed ([place])  Diagnosis: Principal Problem:   Severe bipolar I disorder, current or most recent episode depressed ([place]) Active Problems:   TOBACCO ABUSE   Generalized abdominal pain   Sleep deprivation   Opiate dependence ([place])   Bipolar 1 disorder (H[facility])   PTSD (post-traumatic stress disorder)  Total Time spent with patient: 15 minutes  Past Psychiatric History: See admission H&P  Past Medical History:  Past Medical History:  Diagnosis Date  . Anxiety   . Arthritis    left knee  . Astigmatism    both eyes  . Cancer ([place])    melanoma removed from back   . Cataract  left eye  . Depression   . Diverticulosis   . Erosive esophagitis   . Family history of ovarian cancer   . Family history of prostate cancer   . Family history of uterine cancer   . 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 GERD (gastroesophageal reflux disease)   . H/O urinary infection   . Headache   . History of hiatal hernia   . IBS (irritable bowel syndrome)    no current med.  . Jaundice   . MRSA (methicillin resistant Staphylococcus  aureus)    hospitalized for 48 hours in Dec 2016  . Staph infection    "in my blood"    Past Surgical History:  Procedure Laterality Date  . CHOLECYSTECTOMY  08/02/2012   Procedure: LAPAROSCOPIC CHOLECYSTECTOMY WITH INTRAOPERATIVE CHOLANGIOGRAM;  Surgeon: Rolm Bookbinder, MD;  Location: [facility];  Service: General;  Laterality: N/A;  . COLONOSCOPY WITH PROPOFOL  04/09/2014  . COLONOSCOPY WITH PROPOFOL N/A 07/14/2016   Procedure: COLONOSCOPY WITH PROPOFOL;  Surgeon: Milus Banister, MD;  Location: [facility];  Service: Endoscopy;  Laterality: N/A;  . DILATION AND CURETTAGE OF UTERUS     w/ hysteroscopy to remove cyst  . ESOPHAGOGASTRODUODENOSCOPY N/A 12/02/2017   Procedure: ESOPHAGOGASTRODUODENOSCOPY (EGD);  Surgeon: Carol Ada, MD;  Location: [facility];  Service: Endoscopy;  Laterality: N/A;  . ESOPHAGOGASTRODUODENOSCOPY (EGD) WITH PROPOFOL  04/09/2014  . ESOPHAGOGASTRODUODENOSCOPY (EGD) WITH PROPOFOL N/A 07/14/2016   Procedure: ESOPHAGOGASTRODUODENOSCOPY (EGD) WITH PROPOFOL;  Surgeon: Milus Banister, MD;  [facility]: [facility];  Service: Endoscopy;  Laterality: N/A;  . HYSTEROSCOPY WITH D & C  06/22/2012   Procedure: DILATATION AND CURETTAGE /HYSTEROSCOPY;  Surgeon: Lahoma Crocker, MD;  Location: [facility];  Service: Gynecology;  Laterality: N/A;  . KNEE ARTHROSCOPY  2011   left  . ORIF TOE FRACTURE Left 11/05/2014   Procedure: OPEN REDUCTION INTERNAL FIXATION (ORIF) LEFT FIFTH METATARSAL (TOE) FRACTURE WITH CALCANEAL BONE GRAFT;  Surgeon: Dorna Leitz, MD;  Location: [facility];  Service: Orthopedics;  Laterality: Left;  . WISDOM TOOTH EXTRACTION     Family History:  Family History  Problem Relation Age of Onset  . Heart disease Father   . Stroke Father   . Heart attack Father   . Skin cancer Father   . Diabetes Father   . Uterine cancer Mother 44  . COPD Mother   . Cancer - Colon Maternal Grandfather        mets to bone  . Stomach cancer Paternal Grandmother    . Cervical cancer Sister        dx between 19-33  . Migraines Sister   . Prostate cancer Maternal Uncle 59       mets to bone  . Ovarian cancer Paternal Aunt   . Heart attack Paternal Grandfather   . Skin cancer Paternal Aunt    Family Psychiatric  History: See admission H&P  Social History:  Social History   Substance and Sexual Activity  Alcohol Use No  . Alcohol/week: 0.0 standard drinks     Social History   Substance and Sexual Activity  Drug Use No  . Types: Marijuana   Comment: history of use    Social History   Socioeconomic History  . Marital status: Significant Other    Spouse name: Not on file  . Number of children: 0  . Years of education: College  . Highest education level: Not on file  Occupational History  .  Occupation: Disability  Tobacco Use  . Smoking status: Current Every Day Smoker    Packs/day: 1.00    Years: 33.00    Pack years: 33.00    Types: Cigarettes  . Smokeless tobacco: Never Used  . Tobacco comment: form given 02-04-16   Substance and Sexual Activity  . Alcohol use: No    Alcohol/week: 0.0 standard drinks  . Drug use: No    Types: Marijuana    Comment: history of use  . Sexual activity: Yes    Birth control/protection: Post-menopausal  Other Topics Concern  . Not on file  Social History Narrative   Lives at home w/ significant other   Right-handed   Caffeine: coffee "all day"   Social Determinants of Health   Financial Resource Strain:   . Difficulty of Paying Living Expenses:   Food Insecurity:   . Worried About Charity fundraiser in the Last Year:   . Arboriculturist in the Last Year:   Transportation Needs:   . Film/video editor (Medical):   [name] Lack of Transportation (Non-Medical):   Physical Activity:   . Days of Exercise per Week:   . Minutes of Exercise per Session:   Stress:   . Feeling of Stress :   Social Connections:   . Frequency of Communication with Friends and Family:   . Frequency of Social  Gatherings with Friends and Family:   . Attends Religious Services:   . Active Member of Clubs or Organizations:   . Attends Archivist Meetings:   [name] Marital Status:    Additional Social History:   Sleep: Fair  Appetite:  Fair  Current Medications: Current Facility-Administered Medications  Medication Dose Route Frequency Provider Last Rate Last Admin  . acetaminophen (TYLENOL) tablet 650 mg  650 mg Oral Q6H PRN Sharma Covert, MD      . alum & mag hydroxide-simeth (MAALOX/MYLANTA) 200-200-20 MG/5ML suspension 30 mL  30 mL Oral Q4H PRN Sharma Covert, MD      . citalopram (CELEXA) tablet 40 mg  40 mg Oral Daily Caroline Sauger, NP   40 mg at 04/12/20 0741  . cyclobenzaprine (FLEXERIL) tablet 10 mg  10 mg Oral TID PRN Caroline Sauger, NP   10 mg at 04/12/20 1149  . dicyclomine (BENTYL) tablet 20 mg  20 mg Oral Q6H PRN Sharma Covert, MD   20 mg at 04/12/20 0747  . folic acid (FOLVITE) tablet 1 mg  1 mg Oral Daily Caroline Sauger, NP   1 mg at 04/12/20 0741  . gabapentin (NEURONTIN) capsule 400 mg  400 mg Oral TID PC & HS Terrace Chiem I, NP   400 mg at 04/12/20 1150  . hydrOXYzine (ATARAX/VISTARIL) tablet 25 mg  25 mg Oral Q4H PRN Lindell Spar I, NP   25 mg at 04/11/20 1759  . ibuprofen (ADVIL) tablet 800 mg  800 mg Oral TID Caroline Sauger, NP   800 mg at 04/12/20 1150  . loperamide (IMODIUM) capsule 2-4 mg  2-4 mg Oral PRN Sharma Covert, MD   2 mg at 04/12/20 0746  . LORazepam (ATIVAN) tablet 1 mg  1 mg Oral Q8H PRN Sharma Covert, MD   1 mg at 04/12/20 0744  . magnesium hydroxide (MILK OF MAGNESIA) suspension 30 mL  30 mL Oral Daily PRN Sharma Covert, MD      . naproxen (NAPROSYN) tablet 500 mg  500 mg Oral BID PRN Sharma Covert,  MD   500 mg at 04/11/20 2114  . nicotine (NICODERM CQ - dosed in mg/24 hours) patch 21 mg  21 mg Transdermal Daily Sharma Covert, MD   21 mg at 04/12/20 0741  . ondansetron (ZOFRAN-ODT)  disintegrating tablet 4 mg  4 mg Oral Q6H PRN Sharma Covert, MD      . Oxcarbazepine (TRILEPTAL) tablet 300 mg  300 mg Oral BID Caroline Sauger, NP   300 mg at 04/12/20 0741  . pantoprazole (PROTONIX) EC tablet 40 mg  40 mg Oral Daily Caroline Sauger, NP   40 mg at 04/12/20 0741  . QUEtiapine (SEROQUEL) tablet 300 mg  300 mg Oral QHS Sharma Covert, MD   300 mg at 04/11/20 2114  . topiramate (TOPAMAX) tablet 100 mg  100 mg Oral QHS Sharma Covert, MD   100 mg at 04/11/20 2114   Lab Results:  No results found for this or any previous visit (from the past 48 hour(s)). Blood Alcohol level:  Lab Results  Component Value Date   ETH <10 02/24/2020   [facility]  02/13/2010    <5        LOWEST DETECTABLE LIMIT FOR SERUM ALCOHOL IS 5 mg/dL FOR MEDICAL PURPOSES ONLY   Metabolic Disorder Labs: Lab Results  Component Value Date   HGBA1C 5.7 (H) 04/08/2020   MPG 116.89 04/08/2020   No results found for: PROLACTIN Lab Results  Component Value Date   CHOL 252 (H) 04/08/2020   TRIG 96 04/08/2020   HDL 50 04/08/2020   CHOLHDL 5.0 04/08/2020   VLDL 19 04/08/2020   LDLCALC 183 (H) 04/08/2020   LDLCALC 115 (H) 07/03/2007   Physical Findings: AIMS: Facial and Oral Movements Muscles of Facial Expression: None, normal Lips and Perioral Area: None, normal Jaw: None, normal Tongue: None, normal,Extremity Movements Upper (arms, wrists, hands, fingers): None, normal Lower (legs, knees, ankles, toes): None, normal, Trunk Movements Neck, shoulders, hips: None, normal, Overall Severity Severity of abnormal movements (highest score from questions above): None, normal Incapacitation due to abnormal movements: None, normal Patient's awareness of abnormal movements (rate only patient's report): No Awareness, Dental Status Current problems with teeth and/or dentures?: No Does patient usually wear dentures?: No  CIWA:  CIWA-Ar Total: 13 COWS:  COWS Total Score:  5  Musculoskeletal: Strength & Muscle Tone: within normal limits Gait & Station: normal Patient leans: N/A  Psychiatric Specialty Exam: Physical Exam Vitals and nursing note reviewed.  HENT:     Head: Normocephalic and atraumatic.     Nose: Nose normal.     Mouth/Throat:     Pharynx: Oropharynx is clear.  Eyes:     Pupils: Pupils are equal, round, and reactive to light.  Cardiovascular:     Rate and Rhythm: Normal rate.     Pulses: Normal pulses.  Pulmonary:     Effort: Pulmonary effort is normal.  Genitourinary:    Comments: Deferred Musculoskeletal:        General: Normal range of motion.     Cervical back: Normal range of motion.  Skin:    General: Skin is warm and dry.  Neurological:     General: No focal deficit present.     Mental Status: She is alert and oriented to person, place, and time.     Review of Systems  Constitutional: Negative for chills, diaphoresis and fever.  HENT: Negative for congestion, rhinorrhea, sneezing and sore throat.   Eyes: Negative for discharge.  Respiratory: Negative for cough,  chest tightness, shortness of breath and wheezing.   Cardiovascular: Negative for chest pain and palpitations.  Gastrointestinal: Negative for diarrhea, nausea and vomiting.  Endocrine: Negative for cold intolerance.  Genitourinary: Negative for difficulty urinating.  Musculoskeletal: Negative for arthralgias and myalgias.  Skin: Negative.   Allergic/Immunologic: Negative for environmental allergies and food allergies.       Allergies: Codeine  Neurological: Negative for dizziness, tremors, seizures, syncope, light-headedness and headaches.  Psychiatric/Behavioral: Positive for decreased concentration and dysphoric mood (Crying spells). Negative for agitation, behavioral problems, confusion, hallucinations, self-injury, sleep disturbance and suicidal ideas. The patient is nervous/anxious. The patient is not hyperactive.     Blood pressure (!) 135/93, pulse  (!) 116, temperature 97.7 F (36.5 C), temperature source Oral, resp. rate 20, height 5' 1.5" (1.562 m), weight 78.7 kg, last menstrual period 06/14/2012, SpO2 100 %.Body mass index is 32.25 kg/m.  General Appearance: Casual and Fairly Groomed  Eye Contact:  Fair  Speech:  Normal Rate  Volume:  Decreased  Mood:  Depressed and Dysphoric  Affect:  Congruent  Thought Process:  Coherent and Descriptions of Associations: Intact  Orientation:  Full (Time, Place, and Person)  Thought Content:  Logical  Suicidal Thoughts:  No  Homicidal Thoughts:  No  Memory:  Immediate;   Fair Recent;   Fair Remote;   Fair  Judgement:  Intact  Insight:  Fair  Psychomotor Activity:  Psychomotor Retardation  Concentration:  Concentration: Fair and Attention Span: Fair  Recall:  [employer] of Knowledge:  Fair  Language:  Good  Akathisia:  Negative  Handed:  Right  AIMS (if indicated):     Assets:  Desire for Improvement Resilience  ADL's:  Intact  Cognition:  WNL  Sleep:  Number of Hours: 6   Treatment Plan Summary: Daily contact with patient to assess and evaluate symptoms and progress in treatment, Medication management and Plan : Patient is seen and examined.  Patient is a 49 year old female with the above-stated past psychiatric history who is seen in follow-up.   Diagnosis: 1.  History of bipolar disorder 2.  Opiate dependence 3.  Opiate withdrawal 4.  Benzodiazepine use disorder 5.  Rheumatoid arthritis 6.  Psoriatic arthritis 7.  Migraine headaches  Pertinent findings on examination today: 1.  Improved mood by approximately 40 to 50%. 2.  She stated that she did sleep better, although was recorded she only slept 4.75 hours. 3.  Withdrawal symptoms include headache, nausea and pain. 4.  T3 and T4 are normal with low normal TSH. 5.  Diet-controlled diabetes mellitus 6.  Blood pressure is normal 135/93, pulse 116.  Plan: 1.  -  Continue folic acid 1 mg p.o. daily for nutritional  supplementation. 2.  -  Continue Celexa 40 mg p.o. daily for mood and anxiety. 3.  -  Continue Flexeril 10 mg p.o. 3 times daily as needed muscle spasms. 4.  -  Continue opiate detox protocol and their components. 5.  -  Continue gabapentin from 400 mg p.o. tid to qid daily for chronic pain & anxiety. 6.  -  Continue lorazepam 1 mg p.o. every 6 hours as needed CIWA greater than 10. 7.  -  Continue Trileptal 300 mg p.o. twice daily for mood stability. 8.  -  Continue Protonix 40 mg p.o. daily for nutritional supplementation. 9.  -  Continue prazosin 1 mg p.o. nightly for nightmares and flashbacks. 10. - Stopped Inderal 11. - Continue Seroquel 300 mg p.o. nightly for mood stability &  sleep. 12. - Continue topiramate 100 mg p.o. nightly for migraine headache prevention. 13.  - Disposition planning-in progress. 14   - Continue Vistaril 25 mg po qid prn for anxiety.  Lindell Spar, NP, PMHNP, FNP-BC  04/12/2020, 1:17 PMPatient ID: Gus Rankin, female   DOB: Jun 11, 1971, 49 y.o.   MRN: [id number] Patient ID: TRINI SOLDO, female   DOB: 1971-04-09, 49 y.o.   MRN: [id number]

## 2020-04-12 NOTE — [facility] Suicide Risk Assessment (Signed)
[place] INPATIENT:  Family/Significant Other Suicide Prevention Education  Suicide Prevention Education:  Education Completed;  [name] [phone]),  (name of family member/significant other) has been identified by the patient as the family member/significant other with whom the patient will be residing, and identified as the person(s) who will aid the patient in the event of a mental health crisis (suicidal ideations/suicide attempt).  With written consent from the patient, the family member/significant other has been provided the following suicide prevention education, prior to the and/or following the discharge of the patient.  The suicide prevention education provided includes the following:  Suicide risk factors  Suicide prevention and interventions  National Suicide Hotline telephone number  [facility] assessment telephone number  [facility] Emergency Assistance [place] and/or Residential Mobile Crisis Unit telephone number  Request made of family/significant other to:  Remove weapons (e.g., guns, rifles, knives), all items previously/currently identified as safety concern.    Remove drugs/medications (over-the-counter, prescriptions, illicit drugs), all items previously/currently identified as a safety concern.  The family member/significant other verbalizes understanding of the suicide prevention education information provided.  The family member/significant other agrees to remove the items of safety concern listed above.          [name] 04/12/2020, 9:48 AM

## 2020-04-12 NOTE — Progress Notes (Signed)
   04/12/20 0946  COVID-19 Daily Checkoff  Have you had a fever (temp > 37.80C/100F)  in the past 24 hours?  No  If you have had runny nose, nasal congestion, sneezing in the past 24 hours, has it worsened? No  COVID-19 EXPOSURE  Have you traveled outside the state in the past 14 days? No  Have you been in contact with someone with a confirmed diagnosis of COVID-19 or PUI in the past 14 days without wearing appropriate PPE? No  Have you been living in the same home as a person with confirmed diagnosis of COVID-19 or a PUI (household contact)? No  Have you been diagnosed with COVID-19? No

## 2020-04-12 NOTE — Progress Notes (Signed)
   04/12/20 2213  COVID-19 Daily Checkoff  Have you had a fever (temp > 37.80C/100F)  in the past 24 hours?  No  If you have had runny nose, nasal congestion, sneezing in the past 24 hours, has it worsened? No  COVID-19 EXPOSURE  Have you traveled outside the state in the past 14 days? No  Have you been in contact with someone with a confirmed diagnosis of COVID-19 or PUI in the past 14 days without wearing appropriate PPE? No  Have you been living in the same home as a person with confirmed diagnosis of COVID-19 or a PUI (household contact)? No  Have you been diagnosed with COVID-19? No

## 2020-04-13 DIAGNOSIS — F314 Bipolar disorder, current episode depressed, severe, without psychotic features: Secondary | ICD-10-CM | POA: Diagnosis not present

## 2020-04-13 MED ORDER — PRAZOSIN HCL 1 MG PO CAPS
1.0000 mg | ORAL_CAPSULE | Freq: Every day | ORAL | Status: DC
  Administered 2020-04-13: 1 mg via ORAL
  Filled 2020-04-13 (×2): qty 1

## 2020-04-13 MED ORDER — TOPIRAMATE 100 MG PO TABS
150.0000 mg | ORAL_TABLET | Freq: Every day | ORAL | Status: DC
  Administered 2020-04-13 – 2020-04-16 (×4): 150 mg via ORAL
  Filled 2020-04-13 (×5): qty 1.5

## 2020-04-13 MED ORDER — PRAZOSIN HCL 1 MG PO CAPS
1.0000 mg | ORAL_CAPSULE | Freq: Once | ORAL | Status: AC
  Administered 2020-04-13: 1 mg via ORAL
  Filled 2020-04-13: qty 1

## 2020-04-13 NOTE — Progress Notes (Signed)
D:  Patient's self inventory sheet, patient has poor sleep, no sleep medication.  Good appetite, low energy level, poor concentration.  Rated depression 9, hopeless 5, anxiety 10.  Withdrawals, agitation, nausea, irritability.  Denied SI.  Physical problems, back pain, shoulder, headaches.  Worst pain #6 in past 24 hours.  Pain medicine helpful.  Goal sleep.  Plan to rest.  "I had night terrors and had medication at 3:30 a.m."  No discharge plans. A:  Medications administered per MD orders.  Emotional support and encouragement given patient. R;  Denied SI and HI, contracts for safety.  Denied A/V hallucinations.  Safety maintained with 15 minute checks.

## 2020-04-13 NOTE — Progress Notes (Signed)
Patient's aunt called, Stark Klein, phone [phone], and wants call from SW/MD/staff about [facility].  Tyishia stated "she thinks she is running the show but I do not want her up in my treatment plan." Stark Klein is on patient's approval list to talk about patient's status.  Per patient's approval, RN gave Langley Gauss information that patient was not discharging today.   Langley Gauss wants patient kept at [facility] 7 days.  Working on placement at [facility]. [address] [place], which costs $10,500.month, must wait on scholarship.  Patient on medicaid and medicare.  Patient has refused [facility].  Patient is aware of information given by [facility].

## 2020-04-13 NOTE — Progress Notes (Signed)
Adult Psychoeducational Group Note  Date:  04/13/2020 Time:  8:45 AM  Group Topic/Focus:  Wrap-Up Group:   The focus of this group is to help patients review their daily goal of treatment and discuss progress on daily workbooks.  Participation Level:  Active  Participation Quality:  Appropriate  Affect:  Appropriate  Cognitive:  Appropriate  Insight: Appropriate  Engagement in Group:  Engaged  Modes of Intervention:  Discussion  Additional Comments:  Pt attend wrap up group. Her day was 8. Her goal get involved in group.she achieve her goal. Her coping skills not to be so emotional discus S her feelings.  [name] 04/13/2020, 8:45 AM
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
## 2020-04-13 NOTE — Progress Notes (Signed)
   04/13/20 2000  COVID-19 Daily Checkoff  Have you had a fever (temp > 37.80C/100F)  in the past 24 hours?  No  COVID-19 EXPOSURE  Have you traveled outside the state in the past 14 days? No  Have you been in contact with someone with a confirmed diagnosis of COVID-19 or PUI in the past 14 days without wearing appropriate PPE? No  Have you been living in the same home as a person with confirmed diagnosis of COVID-19 or a PUI (household contact)? No  Have you been diagnosed with COVID-19? No

## 2020-04-13 NOTE — Plan of Care (Signed)
Nurse discussed anxiety, depression and coping skills with patient.  

## 2020-04-13 NOTE — BHH Counselor (Signed)
CSW returned call to [name], pt's cousin at [phone]. CSW provided update. Cousin expressed desire for pt to go to residential tx. CSW explained that he would follow up with pt to confirm what pt's desires are for tx. Cousin also explained that patient wants a new payee. CSW explained he would discuss with pt and also that payee changes would be completed through [facility]. Cousin then explained that pt needs a letter from psychiatrist stating that pt is "mentally competent" in order to get a trust set up for a piece of property. CSW explained that pt would need to be assessed by outpatient psychiatrist. [name] began to raise her voice and demand that psychiatrist write a letter. She began to raise her voice and interrupting CSW. She yelled "either she is competent and leaves or she's not and she stays with y'all"  CSW explained that she was speaking to [facility] inapropriatly and terminated phone call.

## 2020-04-13 NOTE — B[facility] Counselor (Signed)
Called [facility] on [address]. They explained that they only have 1 therapist and 1 psychiatrist available if pt wanted to stay with [facility].

## 2020-04-13 NOTE — [facility] Group Notes (Signed)
Occupational Therapy Group Note Date: 04/13/2020 Group Topic/Focus: Communication Skills  Group Description: Group encouraged increased engagement and participation through discussion focused on communication styles. Patients were educated on the different styles of communication including passive, aggressive, assertive, and passive-aggressive communication. Group members shared and reflected on which styles they most often find themselves communicating in and brainstormed strategies on how to transition and practice a more assertive approach. Further discussion explored how to use assertiveness skills and strategies to further advocate and ask questions as it relates to their treatment plan and mental health.  Participation Level: Moderate   Participation Quality: Independent and Pt left group early   Behavior: Interactive   Speech/Thought Process: Directed   Affect/Mood: Constricted   Insight: Fair   Judgement: Fair   Individualization: Dayanna joined group briefly, before leaving early without reason. Pt engaged actively in group discussion and became quite animated when describing an aggressive communicator. Pt disclosed that she knows someone like this "very well" however declined to share further.   Modes of Intervention: Discussion, Education, Role-play and Socialization  Patient Response to Interventions:  Attentive, Engaged, Receptive and Interested   Plan: Continue to engage patient in OT groups 2 - 3x/week.  04/13/2020  Ponciano Ort, MOT, OTR/L

## 2020-04-13 NOTE — Progress Notes (Signed)
[facility] MD Progress Note  04/13/2020 1:02 PM [name]  MRN:  [id number]  Subjective: [name] reports, " I am really angry today, nurse messed up my medications and I did not get my minipress". " I had worst nigh mares at night and I am extremely angry today".I'm not ready to be discharged yet. She states her anxiety is at 83 today, if 10 is the worst anxiety possible. She states she has suicidal ideations today but has not thought of her plan yet. She denies any homicidal ideations or AVH. She states she slept terribly and also did not eat her breakfast this morning.She demands to increase her Klonopin today. Objective: Patient is seen and examined. She presents angry, anxious but alert, oriented * 4 & aware of situation. She is visible on the unit, attending group sessions. She is fixated on her medications. She was provided assurance and medication plan was reviewed again and she agrees to it. She does not feel like responding to the internal stimuli. She does not show any self injurious behavior on the unit. Her blood pressure 115/67, pulse 98, temperature 97.7 F (36.5 C), temperature source Oral, resp. rate 20. According to nursing notes she slept 6.25 hours yesterday. No new labs today.  Principal Problem: Severe bipolar I disorder, current or most recent episode depressed ([place])  Diagnosis: Principal Problem:   Severe bipolar I disorder, current or most recent episode depressed ([place]) Active Problems:   TOBACCO ABUSE   Generalized abdominal pain   Sleep deprivation   Opiate dependence ([place])   Bipolar 1 disorder ([facility])   PTSD (post-traumatic stress disorder)  Total Time spent with patient: 15 minutes  Past Psychiatric History: See admission H&P  Past Medical History:  Past Medical History:  Diagnosis Date  . Anxiety   . Arthritis    left knee  . Astigmatism    both eyes  . Cancer ([place])    melanoma removed from back   . Cataract    left eye  . Depression   . Diverticulosis    . Erosive esophagitis   . Family history of ovarian cancer   . Family history of prostate cancer   . Family history of uterine cancer   . 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 GERD (gastroesophageal reflux disease)   . H/O urinary infection   . Headache   . History of hiatal hernia   . IBS (irritable bowel syndrome)    no current med.  . Jaundice   . MRSA (methicillin resistant Staphylococcus aureus)    hospitalized for 48 hours in Dec 2016  . Staph infection    "in my blood"    Past Surgical History:  Procedure Laterality Date  . CHOLECYSTECTOMY  08/02/2012   Procedure: LAPAROSCOPIC CHOLECYSTECTOMY WITH INTRAOPERATIVE CHOLANGIOGRAM;  Surgeon: Rolm Bookbinder, MD;  Location: [facility];  Service: General;  Laterality: N/A;  . COLONOSCOPY WITH PROPOFOL  04/09/2014  . COLONOSCOPY WITH PROPOFOL N/A 07/14/2016   Procedure: COLONOSCOPY WITH PROPOFOL;  Surgeon: Milus Banister, MD;  Location: [facility];  Service: Endoscopy;  Laterality: N/A;  . DILATION AND CURETTAGE OF UTERUS     w/ hysteroscopy to remove cyst  . ESOPHAGOGASTRODUODENOSCOPY N/A 12/02/2017   Procedure: ESOPHAGOGASTRODUODENOSCOPY (EGD);  Surgeon: Carol Ada, MD;  Location: [facility];  Service: Endoscopy;  Laterality: N/A;  . ESOPHAGOGASTRODUODENOSCOPY (EGD) WITH PROPOFOL  04/09/2014  . ESOPHAGOGASTRODUODENOSCOPY (EGD) WITH PROPOFOL N/A 07/14/2016   Procedure: ESOPHAGOGASTRODUODENOSCOPY (EGD) WITH PROPOFOL;  Surgeon: Milus Banister, MD;  Location: [facility];  Service: Endoscopy;  Laterality: N/A;  . HYSTEROSCOPY WITH D & C  06/22/2012   Procedure: DILATATION AND CURETTAGE /HYSTEROSCOPY;  Surgeon: Lahoma Crocker, MD;  Location: [facility];  Service: Gynecology;  Laterality: N/A;  . KNEE ARTHROSCOPY  2011   left  . ORIF TOE FRACTURE Left 11/05/2014   Procedure: OPEN REDUCTION INTERNAL FIXATION (ORIF) LEFT FIFTH METATARSAL (TOE) FRACTURE WITH CALCANEAL BONE GRAFT;  Surgeon: Dorna Leitz, MD;  Location: [facility];  Service: Orthopedics;  Laterality: Left;  . WISDOM TOOTH EXTRACTION     Family History:  Family History  Problem Relation Age of Onset  . Heart disease Father   . Stroke Father   . Heart attack Father   . Skin cancer Father   . Diabetes Father   . Uterine cancer Mother 64  . COPD Mother   . Cancer - Colon Maternal Grandfather        mets to bone  . Stomach cancer Paternal Grandmother   . Cervical cancer Sister        dx between 66-33  . Migraines Sister   . Prostate cancer Maternal Uncle 59       mets to bone  . Ovarian cancer Paternal Aunt   . Heart attack Paternal Grandfather   . Skin cancer Paternal Aunt    Family Psychiatric  History: See admission H&P  Social History:  Social History   Substance and Sexual Activity  Alcohol Use No  . Alcohol/week: 0.0 standard drinks     Social History   Substance and Sexual Activity  Drug Use No  . Types: Marijuana   Comment: history of use    Social History   Socioeconomic History  . Marital status: Significant Other    Spouse name: Not on file  . Number of children: 0  . Years of education: College  . Highest education level: Not on file  Occupational History  . Occupation: Disability  Tobacco Use  . Smoking status: Current Every Day Smoker    Packs/day: 1.00    Years: 33.00    Pack years: 33.00    Types: Cigarettes  . Smokeless tobacco: Never Used  . Tobacco comment: form given 02-04-16   Substance and Sexual Activity  . Alcohol use: No    Alcohol/week: 0.0 standard drinks  . Drug use: No    Types: Marijuana    Comment: history of use  . Sexual activity: Yes    Birth control/protection: Post-menopausal  Other Topics Concern  . Not on file  Social History Narrative   Lives at home w/ significant other   Right-handed   Caffeine: coffee "all day"   Social Determinants of Health   Financial Resource Strain:   . Difficulty of Paying Living Expenses:    Food Insecurity:   . Worried About Charity fundraiser in the Last Year:   . Arboriculturist in the Last Year:   Transportation Needs:   . Film/video editor (Medical):   [name] Lack of Transportation (Non-Medical):   Physical Activity:   . Days of Exercise per Week:   . Minutes of Exercise per Session:   Stress:   . Feeling of Stress :   Social Connections:   . Frequency of Communication with Friends and Family:   . Frequency of Social Gatherings with Friends and Family:   . Attends Religious Services:   . Active Member of Clubs or  Organizations:   . Attends Archivist Meetings:   [name] Marital Status:    Additional Social History:   Sleep: Fair  Appetite:  Fair  Current Medications: Current Facility-Administered Medications  Medication Dose Route Frequency Provider Last Rate Last Admin  . acetaminophen (TYLENOL) tablet 650 mg  650 mg Oral Q6H PRN Sharma Covert, MD      . alum & mag hydroxide-simeth (MAALOX/MYLANTA) 200-200-20 MG/5ML suspension 30 mL  30 mL Oral Q4H PRN Sharma Covert, MD      . citalopram (CELEXA) tablet 40 mg  40 mg Oral Daily Caroline Sauger, NP   40 mg at 04/13/20 0759  . cyclobenzaprine (FLEXERIL) tablet 10 mg  10 mg Oral TID PRN Caroline Sauger, NP   10 mg at 04/13/20 1039  . dicyclomine (BENTYL) tablet 20 mg  20 mg Oral Q6H PRN Sharma Covert, MD   20 mg at 04/13/20 0804  . folic acid (FOLVITE) tablet 1 mg  1 mg Oral Daily Caroline Sauger, NP   1 mg at 04/13/20 0800  . gabapentin (NEURONTIN) capsule 400 mg  400 mg Oral TID PC & HS Nwoko, Agnes I, NP   400 mg at 04/13/20 0800  . hydrOXYzine (ATARAX/VISTARIL) tablet 25 mg  25 mg Oral Q4H PRN Lindell Spar I, NP   25 mg at 04/13/20 0958  . ibuprofen (ADVIL) tablet 800 mg  800 mg Oral TID Caroline Sauger, NP   800 mg at 04/13/20 0759  . loperamide (IMODIUM) capsule 2-4 mg  2-4 mg Oral PRN Sharma Covert, MD   4 mg at 04/13/20 0803  . LORazepam (ATIVAN) tablet 1 mg   1 mg Oral Q8H PRN Sharma Covert, MD   1 mg at 04/13/20 1040  . magnesium hydroxide (MILK OF MAGNESIA) suspension 30 mL  30 mL Oral Daily PRN Sharma Covert, MD      . naproxen (NAPROSYN) tablet 500 mg  500 mg Oral BID PRN Sharma Covert, MD   500 mg at 04/13/20 0216  . nicotine (NICODERM CQ - dosed in mg/24 hours) patch 21 mg  21 mg Transdermal Daily Sharma Covert, MD   21 mg at 04/13/20 0759  . ondansetron (ZOFRAN-ODT) disintegrating tablet 4 mg  4 mg Oral Q6H PRN Sharma Covert, MD      . Oxcarbazepine (TRILEPTAL) tablet 300 mg  300 mg Oral BID Caroline Sauger, NP   300 mg at 04/13/20 0759  . pantoprazole (PROTONIX) EC tablet 40 mg  40 mg Oral Daily Caroline Sauger, NP   40 mg at 04/13/20 0759  . QUEtiapine (SEROQUEL) tablet 300 mg  300 mg Oral QHS Lindell Spar I, NP   300 mg at 04/12/20 2103  . topiramate (TOPAMAX) tablet 100 mg  100 mg Oral QHS Sharma Covert, MD   100 mg at 04/12/20 2103   Lab Results:  No results found for this or any previous visit (from the past 48 hour(s)). Blood Alcohol level:  Lab Results  Component Value Date   ETH <10 02/24/2020   ETH  02/13/2010    <5        LOWEST DETECTABLE LIMIT FOR SERUM ALCOHOL IS 5 mg/dL FOR MEDICAL PURPOSES ONLY   Metabolic Disorder Labs: Lab Results  Component Value Date   HGBA1C 5.7 (H) 04/08/2020   MPG 116.89 04/08/2020   No results found for: PROLACTIN Lab Results  Component Value Date   CHOL 252 (H) 04/08/2020   TRIG  96 04/08/2020   HDL 50 04/08/2020   CHOLHDL 5.0 04/08/2020   VLDL 19 04/08/2020   LDLCALC 183 (H) 04/08/2020   LDLCALC 115 (H) 07/03/2007   Physical Findings: AIMS: Facial and Oral Movements Muscles of Facial Expression: None, normal Lips and Perioral Area: None, normal Jaw: None, normal Tongue: None, normal,Extremity Movements Upper (arms, wrists, hands, fingers): None, normal Lower (legs, knees, ankles, toes): None, normal, Trunk Movements Neck, shoulders,  hips: None, normal, Overall Severity Severity of abnormal movements (highest score from questions above): None, normal Incapacitation due to abnormal movements: None, normal Patient's awareness of abnormal movements (rate only patient's report): No Awareness, Dental Status Current problems with teeth and/or dentures?: No Does patient usually wear dentures?: No  CIWA:  CIWA-Ar Total: 13 COWS:  COWS Total Score: 10  Musculoskeletal: Strength & Muscle Tone: within normal limits Gait & Station: normal Patient leans: N/A  Psychiatric Specialty Exam: Physical Exam Vitals and nursing note reviewed.  HENT:     Head: Normocephalic and atraumatic.     Nose: Nose normal.     Mouth/Throat:     Pharynx: Oropharynx is clear.  Eyes:     Pupils: Pupils are equal, round, and reactive to light.  Cardiovascular:     Rate and Rhythm: Normal rate.     Pulses: Normal pulses.  Pulmonary:     Effort: Pulmonary effort is normal.  Genitourinary:    Comments: Deferred Musculoskeletal:        General: Normal range of motion.     Cervical back: Normal range of motion.  Skin:    General: Skin is warm and dry.  Neurological:     General: No focal deficit present.     Mental Status: She is alert and oriented to person, place, and time.     Review of Systems  Constitutional: Negative for chills, diaphoresis and fever.  HENT: Negative for congestion, rhinorrhea, sneezing and sore throat.   Eyes: Negative for discharge.  Respiratory: Negative for cough, chest tightness, shortness of breath and wheezing.   Cardiovascular: Negative for chest pain and palpitations.  Gastrointestinal: Negative for diarrhea, nausea and vomiting.  Endocrine: Negative for cold intolerance.  Genitourinary: Negative for difficulty urinating.  Musculoskeletal: Negative for arthralgias and myalgias.  Skin: Negative.   Allergic/Immunologic: Negative for environmental allergies and food allergies.       Allergies: Codeine   Neurological: Negative for dizziness, tremors, seizures, syncope, light-headedness and headaches.  Psychiatric/Behavioral: Positive for decreased concentration and dysphoric mood (Crying spells). Negative for agitation, behavioral problems, confusion, hallucinations, self-injury, sleep disturbance and suicidal ideas. The patient is nervous/anxious. The patient is not hyperactive.     Blood pressure 115/67, pulse 98, temperature 97.7 F (36.5 C), temperature source Oral, resp. rate 20, height 5' 1.5" (1.562 m), weight 78.7 kg, last menstrual period 06/14/2012, SpO2 100 %.Body mass index is 32.25 kg/m.  General Appearance: Casual and Fairly Groomed  Eye Contact:  Fair  Speech:  Normal Rate  Volume:  Decreased  Mood:  Depressed and Dysphoric  Affect:  Congruent  Thought Process:  Coherent and Descriptions of Associations: Intact  Orientation:  Full (Time, Place, and Person)  Thought Content:  Logical  Suicidal Thoughts:  Yes.  without intent/plan  Homicidal Thoughts:  No  Memory:  Immediate;   Fair Recent;   Fair Remote;   Fair  Judgement:  Intact  Insight:  Fair  Psychomotor Activity:  Psychomotor Retardation  Concentration:  Concentration: Fair and Attention Span: Fair  Recall:  Fair  Fund of Knowledge:  Fair  Language:  Good  Akathisia:  Negative  Handed:  Right  AIMS (if indicated):     Assets:  Desire for Improvement Resilience  ADL's:  Intact  Cognition:  WNL  Sleep:  Number of Hours: 6.25   Treatment Plan Summary: Daily contact with patient to assess and evaluate symptoms and progress in treatment, Medication management and Plan : Patient is seen and examined.  Patient is a 49 year old female with the above-stated past psychiatric history who is seen in follow-up.   Diagnosis: 1.  History of bipolar disorder 2.  Opiate dependence 3.  Opiate withdrawal 4.  Benzodiazepine use disorder 5.  Rheumatoid arthritis 6.  Psoriatic arthritis 7.  Migraine  headaches  Pertinent findings on examination today: 1.  Angry on presentation 2.  Complains of nighmares 3. Complains of re-surfacing of emotions after group therapy yesterday.  Plan: 1.  -  Continue folic acid 1 mg p.o. daily for nutritional supplementation. 2.  -  Continue Celexa 40 mg p.o. daily for mood and anxiety. 3.  -  Continue Flexeril 10 mg p.o. 3 times daily as needed muscle spasms. 4.  -  Continue opiate detox protocol and their components. 5.  -  Continue gabapentin from 400 mg p.o. tid to qid daily for chronic pain & anxiety. 6.  -  Continue lorazepam 1 mg p.o. every 6 hours as needed CIWA greater than 10. 7.  -  Continue Trileptal 300 mg p.o. twice daily for mood stability. 8.  -  Continue Protonix 40 mg p.o. daily for nutritional supplementation. 9.  -  Continue prazosin 1 mg p.o. nightly for nightmares and flashbacks. 10. - Continue Seroquel 300 mg p.o. nightly for mood stability & sleep. 11. - Continue topiramate 100 mg p.o. nightly for migraine headache prevention. 12.  - Disposition planning-in progress. 13   - Continue Vistaril 25 mg po qid prn for anxiety. 14. _ Encourgement to attend group therapies 15- Disposition in progress.  Honor Junes, MD, PMHNP, FNP-BC  04/13/2020, 1:02 PMPatient ID: [name], female   DOB: [birth year], 49 y.o.   MRN: [id number] [name], female   DOB: 1970-10-04, 49 y.o.   MRN: [id number]

## 2020-04-14 DIAGNOSIS — F314 Bipolar disorder, current episode depressed, severe, without psychotic features: Secondary | ICD-10-CM | POA: Diagnosis not present

## 2020-04-14 DIAGNOSIS — F1120 Opioid dependence, uncomplicated: Secondary | ICD-10-CM | POA: Diagnosis not present

## 2020-04-14 DIAGNOSIS — F4310 Post-traumatic stress disorder, unspecified: Secondary | ICD-10-CM | POA: Diagnosis not present

## 2020-04-14 MED ORDER — PRAZOSIN HCL 2 MG PO CAPS
2.0000 mg | ORAL_CAPSULE | Freq: Every day | ORAL | Status: DC
  Administered 2020-04-14 – 2020-04-15 (×2): 2 mg via ORAL
  Filled 2020-04-14 (×3): qty 1
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
## 2020-04-14 NOTE — Progress Notes (Signed)
1:1 time spent with writer discussing non-pharmacological methods to pain relief    04/14/20 2100  Psych Admission Type (Psych Patients Only)  Admission Status Voluntary  Psychosocial Assessment  Patient Complaints Anxiety  Eye Contact Fair  Facial Expression Anxious;Sad  Affect Depressed;Anxious;Sad  Speech Logical/coherent  Interaction Assertive  Motor Activity Other (Comment);Slow (walks with care)  Appearance/Hygiene Unremarkable  Behavior Characteristics Anxious  Mood Anxious  Thought Process  Coherency WDL  Content WDL  Delusions None reported or observed  Perception WDL  Hallucination None reported or observed  Judgment Poor  Confusion None  Danger to Self  Current suicidal ideation? Denies  Self-Injurious Behavior No self-injurious ideation or behavior indicators observed or expressed   Agreement Not to Harm Self Yes  Description of Agreement verbally contracts for safety  Danger to Others  Danger to Others None reported or observed
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
## 2020-04-15 DIAGNOSIS — F4310 Post-traumatic stress disorder, unspecified: Secondary | ICD-10-CM | POA: Diagnosis not present

## 2020-04-15 DIAGNOSIS — F1120 Opioid dependence, uncomplicated: Secondary | ICD-10-CM | POA: Diagnosis not present

## 2020-04-15 DIAGNOSIS — F314 Bipolar disorder, current episode depressed, severe, without psychotic features: Secondary | ICD-10-CM | POA: Diagnosis not present

## 2020-04-15 MED ORDER — QUETIAPINE FUMARATE 400 MG PO TABS
400.0000 mg | ORAL_TABLET | Freq: Every day | ORAL | Status: DC
  Administered 2020-04-15: 400 mg via ORAL
  Filled 2020-04-15 (×2): qty 1

## 2020-04-15 NOTE — Plan of Care (Signed)
Nurse discussed anxiety, depression and coping skills with patient.  
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
## 2020-04-15 NOTE — Progress Notes (Signed)
D:  Patient denied SI and HI, contracts for safety.  Denied A/V hallucinations.   A:  Medications administered per MD orders.  Emotional support and encouragement given patient. R:  Safety maintained with 15 minute checks.  

## 2020-04-15 NOTE — Tx Team (Signed)
Interdisciplinary Treatment and Diagnostic Plan Update  04/15/2020 Time of Session: 9:05am [name] MRN: [id number]  Principal Diagnosis: Severe bipolar I disorder, current or most recent episode depressed ([place])  Secondary Diagnoses: Principal Problem:   Severe bipolar I disorder, current or most recent episode depressed ([place]) Active Problems:   TOBACCO ABUSE   Generalized abdominal pain   Sleep deprivation   Opiate dependence ([place])   Bipolar 1 disorder (HCC)   PTSD (post-traumatic stress disorder)   Current Medications:  Current Facility-Administered Medications  Medication Dose Route Frequency Provider Last Rate Last Admin  . acetaminophen (TYLENOL) tablet 650 mg  650 mg Oral Q6H PRN Sharma Covert, MD   650 mg at 04/14/20 2052  . alum & mag hydroxide-simeth (MAALOX/MYLANTA) 200-200-20 MG/5ML suspension 30 mL  30 mL Oral Q4H PRN Sharma Covert, MD      . citalopram (CELEXA) tablet 40 mg  40 mg Oral Daily Caroline Sauger, NP   40 mg at 04/15/20 0727  . cyclobenzaprine (FLEXERIL) tablet 10 mg  10 mg Oral TID PRN Caroline Sauger, NP   10 mg at 04/15/20 0734  . folic acid (FOLVITE) tablet 1 mg  1 mg Oral Daily Caroline Sauger, NP   1 mg at 04/15/20 8338  . gabapentin (NEURONTIN) capsule 400 mg  400 mg Oral TID PC & HS Nwoko, Agnes I, NP   400 mg at 04/15/20 1300  . hydrOXYzine (ATARAX/VISTARIL) tablet 25 mg  25 mg Oral Q4H PRN Lindell Spar I, NP   25 mg at 04/14/20 2207  . ibuprofen (ADVIL) tablet 800 mg  800 mg Oral TID Caroline Sauger, NP   800 mg at 04/15/20 1300  . LORazepam (ATIVAN) tablet 1 mg  1 mg Oral Q8H PRN Sharma Covert, MD   1 mg at 04/15/20 0958  . magnesium hydroxide (MILK OF MAGNESIA) suspension 30 mL  30 mL Oral Daily PRN Sharma Covert, MD      . nicotine (NICODERM CQ - dosed in mg/24 hours) patch 21 mg  21 mg Transdermal Daily Sharma Covert, MD   21 mg at 04/15/20 0729  . Oxcarbazepine (TRILEPTAL) tablet 300 mg  300 mg  Oral BID Caroline Sauger, NP   300 mg at 04/15/20 0729  . pantoprazole (PROTONIX) EC tablet 40 mg  40 mg Oral Daily Caroline Sauger, NP   40 mg at 04/15/20 0729  . prazosin (MINIPRESS) capsule 2 mg  2 mg Oral QHS Lindell Spar I, NP   2 mg at 04/14/20 2207  . QUEtiapine (SEROQUEL) tablet 400 mg  400 mg Oral QHS Nwoko, Agnes I, NP      . topiramate (TOPAMAX) tablet 150 mg  150 mg Oral QHS Sharma Covert, MD   150 mg at 04/14/20 2207   PTA Medications: Medications Prior to Admission  Medication Sig Dispense Refill Last Dose  . citalopram (CELEXA) 40 MG tablet Take 40 mg by mouth daily.   1   . clonazePAM (KLONOPIN) 1 MG tablet Take 1 mg by mouth 2 (two) times daily as needed for anxiety.     . cloNIDine (CATAPRES) 0.1 MG tablet Take 0.1 mg by mouth 2 (two) times daily as needed. High Blood Pressure     . cyclobenzaprine (FLEXERIL) 10 MG tablet Take 10 mg by mouth 3 (three) times daily as needed for muscle spasms.     [name] gabapentin (NEURONTIN) 400 MG capsule Take 400 mg by mouth 3 (three) times daily.     [name]  HUMIRA PEN 40 MG/0.4ML PNKT Inject 40 mg into the muscle every 14 (fourteen) days.      . hydrocortisone (ANUSOL-HC) 2.5 % rectal cream Apply 1 application topically 3 (three) times daily.     . OXcarbazepine (TRILEPTAL) 150 MG tablet Take 300 mg by mouth 2 (two) times daily.     . pantoprazole (PROTONIX) 40 MG tablet Take 1 tablet (40 mg total) by mouth daily. 90 tablet 3   . promethazine (PHENERGAN) 25 MG tablet Take 25 mg by mouth 2 (two) times daily as needed for nausea or vomiting.   3   . propranolol (INDERAL) 10 MG tablet Take 10 mg by mouth 2 (two) times daily.     . QUEtiapine (SEROQUEL) 300 MG tablet Take 300 mg by mouth at bedtime.     . sucralfate (CARAFATE) 1 g tablet Take 1 g by mouth 3 (three) times daily as needed (acid).     . topiramate (TOPAMAX) 100 MG tablet Take 100 mg by mouth 2 (two) times daily.     [name] torsemide (DEMADEX) 20 MG tablet Take 20 mg by mouth  daily as needed (fluid).     . traMADol (ULTRAM) 50 MG tablet Take 50 mg by mouth every 6 (six) hours as needed.     . zolpidem (AMBIEN) 10 MG tablet Take 10 mg by mouth at bedtime.     . dicyclomine (BENTYL) 10 MG capsule Take 2 capsules (20 mg total) 3 (three) times daily by mouth. 540 capsule 3   . folic acid (FOLVITE) 1 MG tablet Take 1 mg by mouth daily. (Patient not taking: Reported on 04/09/2020)  11 Not Taking at Unknown time  . furosemide (LASIX) 40 MG tablet Take 40 mg by mouth 2 (two) times daily. (Patient not taking: Reported on 04/09/2020)   Not Taking at Unknown time  . ibuprofen (ADVIL,MOTRIN) 800 MG tablet Take 1 tablet (800 mg total) by mouth 3 (three) times daily. (Patient not taking: Reported on 04/09/2020) 21 tablet 0 Completed Course at Unknown time  . mupirocin cream (BACTROBAN) 2 % Apply 1 application topically 2 (two) times daily. (Patient not taking: Reported on 04/09/2020) 15 g 0 Completed Course at Unknown time  . sucralfate (CARAFATE) 1 GM/10ML suspension Take 10 mLs (1 g total) by mouth 4 (four) times daily -  with meals and at bedtime. (Patient not taking: Reported on 12/03/2017) 420 mL 0 Completed Course at Unknown time  . temazepam (RESTORIL) 30 MG capsule Take 30 mg by mouth at bedtime as needed for sleep.  (Patient not taking: Reported on 04/09/2020)  1 Not Taking at Unknown time    Patient Stressors: Marital or family conflict Medication change or noncompliance  Patient Strengths: Ability for insight Average or above average intelligence  Treatment Modalities: Medication Management, Group therapy, Case management,  1 to 1 session with clinician, Psychoeducation, Recreational therapy.   Physician Treatment Plan for Primary Diagnosis: Severe bipolar I disorder, current or most recent episode depressed ([place]) Long Term Goal(s): Improvement in symptoms so as ready for discharge Improvement in symptoms so as ready for discharge   Short Term Goals: Ability to identify  changes in lifestyle to reduce recurrence of condition will improve Ability to verbalize feelings will improve Ability to disclose and discuss suicidal ideas Ability to demonstrate self-control will improve Ability to identify and develop effective coping behaviors will improve Compliance with prescribed medications will improve Ability to identify triggers associated with substance abuse/mental health issues will improve  Medication  Management: Evaluate patient's response, side effects, and tolerance of medication regimen.  Therapeutic Interventions: 1 to 1 sessions, Unit Group sessions and Medication administration.  Evaluation of Outcomes: Progressing  Physician Treatment Plan for Secondary Diagnosis: Principal Problem:   Severe bipolar I disorder, current or most recent episode depressed ([place]) Active Problems:   TOBACCO ABUSE   Generalized abdominal pain   Sleep deprivation   Opiate dependence ([place])   Bipolar 1 disorder (HCC)   PTSD (post-traumatic stress disorder)  Long Term Goal(s): Improvement in symptoms so as ready for discharge Improvement in symptoms so as ready for discharge   Short Term Goals: Ability to identify changes in lifestyle to reduce recurrence of condition will improve Ability to verbalize feelings will improve Ability to disclose and discuss suicidal ideas Ability to demonstrate self-control will improve Ability to identify and develop effective coping behaviors will improve Compliance with prescribed medications will improve Ability to identify triggers associated with substance abuse/mental health issues will improve     Medication Management: Evaluate patient's response, side effects, and tolerance of medication regimen.  Therapeutic Interventions: 1 to 1 sessions, Unit Group sessions and Medication administration.  Evaluation of Outcomes: Progressing   RN Treatment Plan for Primary Diagnosis: Severe bipolar I disorder, current or most recent  episode depressed ([place]) Long Term Goal(s): Knowledge of disease and therapeutic regimen to maintain health will improve  Short Term Goals: Ability to remain free from injury will improve, Ability to verbalize frustration and anger appropriately will improve, Ability to identify and develop effective coping behaviors will improve and Compliance with prescribed medications will improve  Medication Management: RN will administer medications as ordered by provider, will assess and evaluate patient's response and provide education to patient for prescribed medication. RN will report any adverse and/or side effects to prescribing provider.  Therapeutic Interventions: 1 on 1 counseling sessions, Psychoeducation, Medication administration, Evaluate responses to treatment, Monitor vital signs and CBGs as ordered, Perform/monitor CIWA, COWS, AIMS and Fall Risk screenings as ordered, Perform wound care treatments as ordered.  Evaluation of Outcomes: Progressing   LCSW Treatment Plan for Primary Diagnosis: Severe bipolar I disorder, current or most recent episode depressed ([place]) Long Term Goal(s): Safe transition to appropriate next level of care at discharge, Engage patient in therapeutic group addressing interpersonal concerns.  Short Term Goals: Engage patient in aftercare planning with referrals and resources, Increase social support, Identify triggers associated with mental health/substance abuse issues and Increase skills for wellness and recovery  Therapeutic Interventions: Assess for all discharge needs, 1 to 1 time with Social worker, Explore available resources and support systems, Assess for adequacy in community support network, Educate family and significant other(s) on suicide prevention, Complete Psychosocial Assessment, Interpersonal group therapy.  Evaluation of Outcomes: Progressing   Progress in Treatment: Attending groups: Yes. Participating in groups: Yes. Taking medication as  prescribed: Yes. Toleration medication: Yes. Family/Significant other contact made: Yes, individual(s) contacted:  husband Patient understands diagnosis: Yes. Discussing patient identified problems/goals with staff: Yes. Medical problems stabilized or resolved: Yes. Denies suicidal/homicidal ideation: Yes. Issues/concerns per patient self-inventory: No.  New problem(s) identified: No, Describe:  none.  New Short Term/Long Term Goal(s): medication stabilization, elimination of SI thoughts, development of comprehensive mental wellness plan.   Patient Goals:  Patient did not attend.   Discharge Plan or Barriers:   Reason for Continuation of Hospitalization: Anxiety Depression Medication stabilization  Estimated Length of Stay: 1-3 days  Attendees: Patient: 04/10/2020   Physician:  04/10/2020   Nursing:  04/10/2020  RN Care Manager: 04/10/2020   Social Worker: Darletta Moll, [place] 04/10/2020   Recreational Therapist:  04/10/2020   Other:  04/10/2020   Other:  04/10/2020   Other: 04/10/2020       Scribe for Treatment Team: Vassie Moselle, LCSW 04/15/2020 1:48 PM

## 2020-04-16 MED ORDER — OXCARBAZEPINE 300 MG PO TABS
450.0000 mg | ORAL_TABLET | Freq: Two times a day (BID) | ORAL | Status: DC
  Administered 2020-04-16 – 2020-04-17 (×2): 450 mg via ORAL
  Filled 2020-04-16 (×5): qty 1

## 2020-04-16 MED ORDER — GABAPENTIN 300 MG PO CAPS
600.0000 mg | ORAL_CAPSULE | Freq: Three times a day (TID) | ORAL | Status: DC
  Administered 2020-04-16 – 2020-04-17 (×4): 600 mg via ORAL
  Filled 2020-04-16 (×9): qty 2

## 2020-04-16 MED ORDER — QUETIAPINE FUMARATE 300 MG PO TABS
600.0000 mg | ORAL_TABLET | Freq: Every day | ORAL | Status: DC
  Administered 2020-04-16: 600 mg via ORAL
  Filled 2020-04-16 (×2): qty 2

## 2020-04-16 NOTE — Progress Notes (Signed)
Adult Psychoeducational Group Note  Date:  04/16/2020 Time:  6:10 AM  Group Topic/Focus:  Wrap-Up Group:   The focus of this group is to help patients review their daily goal of treatment and discuss progress on daily workbooks.  Participation Level:  Active  Participation Quality:  Appropriate  Affect:  Appropriate  Cognitive:  Appropriate  Insight: Appropriate  Engagement in Group:  Engaged  Modes of Intervention:  Discussion  Additional Comments:    Barbette Hair 04/16/2020, 6:10 AM

## 2020-04-16 NOTE — Progress Notes (Signed)
[facility] MD Progress Note  04/16/2020 10:29 AM [name]  MRN:  [id number] Subjective: Patient is a 50 year old female with a past psychiatric history significant for bipolar disorder, benzodiazepine use disorder, opiate use disorder, migraine headaches, psoriatic arthritis and rheumatoid arthritis who was admitted on 04/08/2020 secondary to worsening mood, helplessness, hopelessness and worthlessness as well is withdrawing from several medications.  Objective: Patient is seen and examined.  Patient is a 49 year old female with the above-stated past psychiatric history seen in follow-up.  Her course the hospitalization has been up and down.  Despite changes in her medications including increases in the prazosin for nightmares and flashbacks as well as Seroquel for sleep she has continued to have poor sleep.  She stated that she is able to fall asleep, but the nightmares and flashbacks wake her up in the night.  Nursing notes today reflect that she slept 6 hours.  Her prazosin was increased to 2 mg p.o. nightly yesterday, but had no effect on the nightmares.  We also went through some of her psychosocial issues.  She has a fianc who has been aggressive in terms of finding out what medication she is on.  She stated that she believes that her fianc is "a jerk".  She described the fact that he was the only one who would help her when she was withdrawing from substances, and that he moved then but will not leave.  Part of her discharge plan is that she will go stay with a relative in [place] and then send in eviction notice because the home is her home.  Her current medications include the citalopram at 40 mg p.o. daily, gabapentin 400 mg p.o. 3 times daily and at bedtime.  Trileptal 300 mg p.o. twice daily, Seroquel 400 mg p.o. nightly, prazosin 2 mg p.o. nightly and topiramate 150 mg p.o. nightly.  Her vital signs are stable, she is afebrile.  Nursing notes reflect she slept 6 hours last night.  Review of her  laboratories revealed no new laboratory since admission.  She stated she feels anxious, tired of nightmares and flashbacks, fatigue, but denied suicidal or homicidal ideation.  Principal Problem: Severe bipolar I disorder, current or most recent episode depressed ([place]) Diagnosis: Principal Problem:   Severe bipolar I disorder, current or most recent episode depressed ([place]) Active Problems:   TOBACCO ABUSE   Generalized abdominal pain   Sleep deprivation   Opiate dependence ([place])   Bipolar 1 disorder (HCC)   PTSD (post-traumatic stress disorder)  Total Time spent with patient: 30 minutes  Past Psychiatric History: See admission H&P  Past Medical History:  Past Medical History:  Diagnosis Date  . Anxiety   . Arthritis    left knee  . Astigmatism    both eyes  . Cancer ([place])    melanoma removed from back   . Cataract    left eye  . Depression   . Diverticulosis   . Erosive esophagitis   . Family history of ovarian cancer   . Family history of prostate cancer   . Family history of uterine cancer   . 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 GERD (gastroesophageal reflux disease)   . H/O urinary infection   . Headache   . History of hiatal hernia   . IBS (irritable bowel syndrome)    no current med.  . Jaundice   . MRSA (methicillin resistant Staphylococcus aureus)    hospitalized for 48 hours in  Dec 2016  . Staph infection    "in my blood"    Past Surgical History:  Procedure Laterality Date  . CHOLECYSTECTOMY  08/02/2012   Procedure: LAPAROSCOPIC CHOLECYSTECTOMY WITH INTRAOPERATIVE CHOLANGIOGRAM;  Surgeon: Rolm Bookbinder, MD;  Location: [facility];  Service: General;  Laterality: N/A;  . COLONOSCOPY WITH PROPOFOL  04/09/2014  . COLONOSCOPY WITH PROPOFOL N/A 07/14/2016   Procedure: COLONOSCOPY WITH PROPOFOL;  Surgeon: Milus Banister, MD;  Location: [facility];  Service: Endoscopy;  Laterality: N/A;  . DILATION AND CURETTAGE OF UTERUS      w/ hysteroscopy to remove cyst  . ESOPHAGOGASTRODUODENOSCOPY N/A 12/02/2017   Procedure: ESOPHAGOGASTRODUODENOSCOPY (EGD);  Surgeon: Carol Ada, MD;  Location: [facility];  Service: Endoscopy;  Laterality: N/A;  . ESOPHAGOGASTRODUODENOSCOPY (EGD) WITH PROPOFOL  04/09/2014  . ESOPHAGOGASTRODUODENOSCOPY (EGD) WITH PROPOFOL N/A 07/14/2016   Procedure: ESOPHAGOGASTRODUODENOSCOPY (EGD) WITH PROPOFOL;  Surgeon: Milus Banister, MD;  [facility]: [facility];  Service: Endoscopy;  Laterality: N/A;  . HYSTEROSCOPY WITH D & C  06/22/2012   Procedure: DILATATION AND CURETTAGE /HYSTEROSCOPY;  Surgeon: Lahoma Crocker, MD;  Location: [facility];  Service: Gynecology;  Laterality: N/A;  . KNEE ARTHROSCOPY  2011   left  . ORIF TOE FRACTURE Left 11/05/2014   Procedure: OPEN REDUCTION INTERNAL FIXATION (ORIF) LEFT FIFTH METATARSAL (TOE) FRACTURE WITH CALCANEAL BONE GRAFT;  Surgeon: Dorna Leitz, MD;  Location: [facility];  Service: Orthopedics;  Laterality: Left;  . WISDOM TOOTH EXTRACTION     Family History:  Family History  Problem Relation Age of Onset  . Heart disease Father   . Stroke Father   . Heart attack Father   . Skin cancer Father   . Diabetes Father   . Uterine cancer Mother 55  . COPD Mother   . Cancer - Colon Maternal Grandfather        mets to bone  . Stomach cancer Paternal Grandmother   . Cervical cancer Sister        dx between 4-33  . Migraines Sister   . Prostate cancer Maternal Uncle 59       mets to bone  . Ovarian cancer Paternal Aunt   . Heart attack Paternal Grandfather   . Skin cancer Paternal Aunt    Family Psychiatric  History: See admission H&P Social History:  Social History   Substance and Sexual Activity  Alcohol Use No  . Alcohol/week: 0.0 standard drinks     Social History   Substance and Sexual Activity  Drug Use No  . Types: Marijuana   Comment: history of use    Social History   Socioeconomic History  . Marital status:  Significant Other    Spouse name: Not on file  . Number of children: 0  . Years of education: College  . Highest education level: Not on file  Occupational History  . Occupation: Disability  Tobacco Use  . Smoking status: Current Every Day Smoker    Packs/day: 1.00    Years: 33.00    Pack years: 33.00    Types: Cigarettes  . Smokeless tobacco: Never Used  . Tobacco comment: form given 02-04-16   Substance and Sexual Activity  . Alcohol use: No    Alcohol/week: 0.0 standard drinks  . Drug use: No    Types: Marijuana    Comment: history of use  . Sexual activity: Yes    Birth control/protection: Post-menopausal  Other Topics Concern  . Not on file  Social History Narrative  Lives at home w/ significant other   Right-handed   Caffeine: coffee "all day"   Social Determinants of Health   Financial Resource Strain:   . Difficulty of Paying Living Expenses: Not on file  Food Insecurity:   . Worried About Charity fundraiser in the Last Year: Not on file  . Ran Out of Food in the Last Year: Not on file  Transportation Needs:   . Lack of Transportation (Medical): Not on file  . Lack of Transportation (Non-Medical): Not on file  Physical Activity:   . Days of Exercise per Week: Not on file  . Minutes of Exercise per Session: Not on file  Stress:   . Feeling of Stress : Not on file  Social Connections:   . Frequency of Communication with Friends and Family: Not on file  . Frequency of Social Gatherings with Friends and Family: Not on file  . Attends Religious Services: Not on file  . Active Member of Clubs or Organizations: Not on file  . Attends Archivist Meetings: Not on file  . Marital Status: Not on file   Additional Social History:                         Sleep: Fair  Appetite:  Good  Current Medications: Current Facility-Administered Medications  Medication Dose Route Frequency Provider Last Rate Last Admin  . acetaminophen (TYLENOL)  tablet 650 mg  650 mg Oral Q6H PRN Sharma Covert, MD   650 mg at 04/14/20 2052  . alum & mag hydroxide-simeth (MAALOX/MYLANTA) 200-200-20 MG/5ML suspension 30 mL  30 mL Oral Q4H PRN Sharma Covert, MD      . citalopram (CELEXA) tablet 40 mg  40 mg Oral Daily Caroline Sauger, NP   40 mg at 04/16/20 0742  . cyclobenzaprine (FLEXERIL) tablet 10 mg  10 mg Oral TID PRN Caroline Sauger, NP   10 mg at 04/16/20 0742  . folic acid (FOLVITE) tablet 1 mg  1 mg Oral Daily Caroline Sauger, NP   1 mg at 04/16/20 0742  . gabapentin (NEURONTIN) capsule 400 mg  400 mg Oral TID PC & HS Nwoko, Agnes I, NP   400 mg at 04/16/20 0742  . hydrOXYzine (ATARAX/VISTARIL) tablet 25 mg  25 mg Oral Q4H PRN Lindell Spar I, NP   25 mg at 04/16/20 0742  . ibuprofen (ADVIL) tablet 800 mg  800 mg Oral TID Caroline Sauger, NP   800 mg at 04/16/20 0743  . LORazepam (ATIVAN) tablet 1 mg  1 mg Oral Q8H PRN Sharma Covert, MD   1 mg at 04/16/20 0742  . magnesium hydroxide (MILK OF MAGNESIA) suspension 30 mL  30 mL Oral Daily PRN Sharma Covert, MD      . nicotine (NICODERM CQ - dosed in mg/24 hours) patch 21 mg  21 mg Transdermal Daily Sharma Covert, MD   21 mg at 04/16/20 0742  . Oxcarbazepine (TRILEPTAL) tablet 300 mg  300 mg Oral BID Caroline Sauger, NP   300 mg at 04/16/20 0743  . pantoprazole (PROTONIX) EC tablet 40 mg  40 mg Oral Daily Caroline Sauger, NP   40 mg at 04/16/20 0742  . prazosin (MINIPRESS) capsule 2 mg  2 mg Oral QHS Nwoko, Agnes I, NP   2 mg at 04/15/20 2059  . QUEtiapine (SEROQUEL) tablet 400 mg  400 mg Oral QHS Nwoko, Agnes I, NP   400 mg at  04/15/20 2059  . topiramate (TOPAMAX) tablet 150 mg  150 mg Oral QHS Sharma Covert, MD   150 mg at 04/15/20 2059    Lab Results: No results found for this or any previous visit (from the past 8 hour(s)).  Blood Alcohol level:  Lab Results  Component Value Date   ETH <10 02/24/2020   [facility]  02/13/2010    <5         LOWEST DETECTABLE LIMIT FOR SERUM ALCOHOL IS 5 mg/dL FOR MEDICAL PURPOSES ONLY    Metabolic Disorder Labs: Lab Results  Component Value Date   HGBA1C 5.7 (H) 04/08/2020   MPG 116.89 04/08/2020   No results found for: PROLACTIN Lab Results  Component Value Date   CHOL 252 (H) 04/08/2020   TRIG 96 04/08/2020   HDL 50 04/08/2020   CHOLHDL 5.0 04/08/2020   VLDL 19 04/08/2020   LDLCALC 183 (H) 04/08/2020   LDLCALC 115 (H) 07/03/2007    Physical Findings: AIMS: Facial and Oral Movements Muscles of Facial Expression: None, normal Lips and Perioral Area: None, normal Jaw: None, normal Tongue: None, normal,Extremity Movements Upper (arms, wrists, hands, fingers): None, normal Lower (legs, knees, ankles, toes): None, normal, Trunk Movements Neck, shoulders, hips: None, normal, Overall Severity Severity of abnormal movements (highest score from questions above): None, normal Incapacitation due to abnormal movements: None, normal Patient's awareness of abnormal movements (rate only patient's report): No Awareness, Dental Status Current problems with teeth and/or dentures?: No Does patient usually wear dentures?: No  CIWA:  CIWA-Ar Total: 11 COWS:  COWS Total Score: 11  Musculoskeletal: Strength & Muscle Tone: within normal limits Gait & Station: normal Patient leans: N/A  Psychiatric Specialty Exam: Physical Exam Vitals and nursing note reviewed.  Constitutional:      Appearance: Normal appearance.  HENT:     Head: Normocephalic and atraumatic.  Pulmonary:     Effort: Pulmonary effort is normal.  Neurological:     General: No focal deficit present.     Mental Status: She is alert and oriented to person, place, and time.     Review of Systems  Blood pressure 106/74, pulse 96, temperature 97.9 F (36.6 C), temperature source Oral, resp. rate 18, height 5' 1.5" (1.562 m), weight 78.7 kg, last menstrual period 06/14/2012, SpO2 100 %.Body mass index is 32.25 kg/m.   General Appearance: Disheveled  Eye Contact:  Fair  Speech:  Normal Rate  Volume:  Increased  Mood:  Anxious and Depressed  Affect:  Labile  Thought Process:  Coherent and Descriptions of Associations: Intact  Orientation:  Full (Time, Place, and Person)  Thought Content:  Logical  Suicidal Thoughts:  No  Homicidal Thoughts:  No  Memory:  Immediate;   Fair Recent;   Fair Remote;   Fair  Judgement:  Intact  Insight:  Fair  Psychomotor Activity:  Increased  Concentration:  Concentration: Fair and Attention Span: Fair  Recall:  [employer] of Knowledge:  Fair  Language:  Good  Akathisia:  Negative  Handed:  Right  AIMS (if indicated):     Assets:  Desire for Improvement Housing Resilience  ADL's:  Intact  Cognition:  WNL  Sleep:  Number of Hours: 6     Treatment Plan Summary: Daily contact with patient to assess and evaluate symptoms and progress in treatment, Medication management and Plan : Patient is seen and examined.  Patient is a 49 year old female with the above-stated past psychiatric history who is seen in follow-up.  Diagnosis: 1.  Bipolar disorder type I mixed 2.  Posttraumatic stress disorder. 3.  Opiate use disorder. 4.  Benzodiazepine use disorder. 5.  Hypertension. 6.  Generalized anxiety disorder. 7.  Chronic pain from rheumatoid arthritis as well as psoriatic arthritis  Pertinent findings on examination today: 1.  Patient remains agitated, nervous, anxious and still having nightmares.  The prazosin has yet to be effective. 2.  Mood seems somewhat stable currently on the Celexa.  She does deny suicidal ideation. 3.  Patient continues to complain of poor sleep secondary to nightmares and flashbacks from her PTSD symptoms. 4.  Psychosocial issues continue to impact her current hospitalization and discharge planning.  Plan: 1.  Stop prazosin secondary to ineffectiveness. 2.  Continue components of the opiate detox protocol including Flexeril and  ibuprofen. 3.  Stop lorazepam which has been for withdrawal symptoms. 4.  Continue Celexa 40 mg p.o. daily for depression and PTSD. 5.  Continue folic acid 1 mg p.o. daily for nutritional supplementation. 6.  Increase gabapentin to 600 mg p.o. 3 times daily for anxiety and sleep. 7.  Increase Trileptal to 450 mg p.o. twice daily for mood stability and anxiety. 8.  Increase Seroquel to 600 mg p.o. nightly for mood stability and sleep. 9.  Continue topiramate 150 mg p.o. nightly for migraine headache prevention as well as nightmare suppression. 10.  Disposition planning-hopefully the changes that were making today will make it possible for discharge in 2 to 3 days.  Sharma Covert, MD 04/16/2020, 10:29 AM

## 2020-04-16 NOTE — Plan of Care (Addendum)
Progress note  D: pt found in bed; compliant with medication administration. Pt presents anxious, agitated, fidgety, restless, and with tremors. Pt states they didn't sleep well. Pt states they felt as though they needed ativan for anxiety and to help relax but wasn't able to obtain this. Pt also has complaints of a tension headache that they rate at a 7/10. Pt feels as though they know what they need to do once they leave to alleviate their dependence on controlled substances. "I need to get my medication right first though." pt can be fixated on prn medications at times, and requests these often with little insight into coping skills/strategies. Education provided w/ little evidence of learning. pt is pleasant. Pt denies si/hi/ah/vh and verbally agrees to approach staff if these become apparent or before harming themself/others while at [facility].  A: Pt provided support and encouragement. Pt given medication per protocol and standing orders. Q30m safety checks implemented and continued.  R: Pt safe on the unit. Will continue to monitor.  Pt progressing in the following metrics  Problem: Role Relationship: Goal: Will demonstrate positive changes in social behaviors and relationships Outcome: Progressing   Problem: Safety: Goal: Ability to disclose and discuss suicidal ideas will improve Outcome: Progressing Goal: Ability to identify and utilize support systems that promote safety will improve Outcome: Progressing   Problem: Self-Concept: Goal: Level of anxiety will decrease Outcome: Progressing

## 2020-04-16 NOTE — Progress Notes (Signed)
[facility] Group Notes:  (Nursing/MHT/Case Management/Adjunct)  Date:  04/16/2020  Time:  2030  Type of Therapy:  wrap up group  Participation Level:  Active  Participation Quality:  Appropriate, Attentive, Sharing and Supportive  Affect:  Anxious  Cognitive:  Appropriate  Insight:  Improving  Engagement in Group:  Engaged  Modes of Intervention:  Clarification, Education and Support  Summary of Progress/Problems: Positive thinking and positive change were discussed. Pt shared she plans on ending unhealthy relationship with boyfriend and start eviction process since he lives in her home. Pt reports having support of her family especially a sister whom lives next door. Pt is proud of the self-care she focused on today.   [name] 04/16/2020, 9:23 PM

## 2020-04-16 NOTE — Progress Notes (Signed)
Pt has been storming out her room complaining of night terror and asking for Ativan. Pt given Vistaril, but sated would not help. Pt did this 3 times and each time she was requesting fro Ativan. Pt took minipress 2 mg before going to bed. Pt also complained of lack of sleep and would like to be prescribed sleep aid. Would continue to monitor.

## 2020-04-17 DIAGNOSIS — Z72820 Sleep deprivation: Secondary | ICD-10-CM

## 2020-04-17 DIAGNOSIS — R1084 Generalized abdominal pain: Secondary | ICD-10-CM

## 2020-04-17 DIAGNOSIS — F319 Bipolar disorder, unspecified: Secondary | ICD-10-CM

## 2020-04-17 DIAGNOSIS — F17200 Nicotine dependence, unspecified, uncomplicated: Secondary | ICD-10-CM

## 2020-04-17 MED ORDER — TOPIRAMATE 50 MG PO TABS: 150.0000 mg | ORAL_TABLET | Freq: Every day | ORAL | 1 refills | Status: DC

## 2020-04-17 MED ORDER — OXCARBAZEPINE 150 MG PO TABS: 450.0000 mg | ORAL_TABLET | Freq: Two times a day (BID) | ORAL | 0 refills | Status: DC

## 2020-04-17 MED ORDER — CLONIDINE HCL 0.2 MG PO TABS
0.2000 mg | ORAL_TABLET | Freq: Once | ORAL | Status: AC
  Administered 2020-04-17: 0.2 mg via ORAL
  Filled 2020-04-17: qty 1
  Filled 2020-04-17: qty 2

## 2020-04-17 MED ORDER — CITALOPRAM HYDROBROMIDE 40 MG PO TABS: 40.0000 mg | ORAL_TABLET | Freq: Every day | ORAL | 0 refills | Status: DC

## 2020-04-17 MED ORDER — HYDROXYZINE HCL 25 MG PO TABS: 25.0000 mg | ORAL_TABLET | ORAL | 0 refills | Status: DC | PRN

## 2020-04-17 MED ORDER — PANTOPRAZOLE SODIUM 40 MG PO TBEC: 40.0000 mg | DELAYED_RELEASE_TABLET | Freq: Every day | ORAL | 0 refills | Status: DC

## 2020-04-17 MED ORDER — CLONIDINE HCL 0.1 MG PO TABS: 0.1000 mg | ORAL_TABLET | Freq: Two times a day (BID) | ORAL | Status: DC | PRN

## 2020-04-17 MED ORDER — GABAPENTIN 300 MG PO CAPS: 600.0000 mg | ORAL_CAPSULE | Freq: Three times a day (TID) | ORAL | 1 refills | Status: DC

## 2020-04-17 MED ORDER — NICOTINE 21 MG/24HR TD PT24: 21.0000 mg | MEDICATED_PATCH | Freq: Every day | TRANSDERMAL | 0 refills | Status: DC

## 2020-04-17 MED ORDER — QUETIAPINE FUMARATE 300 MG PO TABS: 600.0000 mg | ORAL_TABLET | Freq: Every day | ORAL | 0 refills | Status: DC

## 2020-04-17 NOTE — Progress Notes (Addendum)
Reported good day yesterday and was observed interacting well with peers in the dayroom. Pt still complains of night mares but not as bad as previous night. Pt slept 5.75 hr, will continue to monitor.

## 2020-04-17 NOTE — Progress Notes (Signed)
Patient did not attend morning group.  

## 2020-04-17 NOTE — Progress Notes (Signed)
Recreation Therapy Notes  Date:  8.20.21 Time: 0930 Location: [address] Dayroom  Group Topic: Stress Management  Goal Area(s) Addresses:  Patient will identify positive stress management techniques. Patient will identify benefits of using stress management post d/c.  Intervention: Stress Management  Activity: Guided Imagery.  LRT read a script that led patients on a journey to the beach to hear the peaceful waves.  Pt were to listen and follow along as script was read to engage in activity.   Education:  Stress Management, Discharge Planning.   Education Outcome: Acknowledges Education  Clinical Observations/Feedback: Pt did not attend activity.    Victorino Sparrow, LRT/CTRS         Victorino Sparrow A 04/17/2020 11:44 AM

## 2020-04-17 NOTE — Progress Notes (Signed)
  [facility] Adult Case Management Discharge Plan :  Will you be returning to the same living situation after discharge:  Yes,  Home with fiance At discharge, do you have transportation home?: Yes,  Friend Almyra Free and Piedad Climes you have the ability to pay for your medications: Yes,  Medicare/ medicaid  Release of information consent forms completed and in the chart;  Patient's signature needed at discharge.  Patient to Follow up at:  Follow-up Information    [facility]. Go on 05/14/2020.   Specialty: Behavioral Health Why: You have an intake appointment on 05/14/20 at 11am virtual through [facility]. You will recieve intake paperwork in the mail that will need to be completed and returned prior to your appointment.  Contact information: [address] [place] [phone]              Next level of care provider has access to [place] and Suicide Prevention discussed: Yes,  Fiance  Have you used any form of tobacco in the last 30 days? (Cigarettes, Smokeless Tobacco, Cigars, and/or Pipes): No  Has patient been referred to the Quitline?: Patient refused referral  Patient has been referred for addiction treatment: [place], LCSW 04/17/2020, 10:59 AM
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
## 2020-04-17 NOTE — Plan of Care (Signed)
Discharge note  Patient verbalizes readiness for discharge. Follow up plan explained, AVS, Transition record and SRA given. Prescriptions and teaching provided. Belongings returned and signed for. Suicide safety plan completed and signed. Patient verbalizes understanding. Patient denies SI/HI and assures this Probation officer they will seek assistance should that change. Patient discharged to lobby where fiance was waiting.  Problem: Education: Goal: Utilization of techniques to improve thought processes will improve Outcome: Adequate for Discharge Goal: Knowledge of the prescribed therapeutic regimen will improve Outcome: Adequate for Discharge   Problem: Activity: Goal: Interest or engagement in leisure activities will improve Outcome: Adequate for Discharge Goal: Imbalance in normal sleep/wake cycle will improve Outcome: Adequate for Discharge   Problem: Coping: Goal: Coping ability will improve Outcome: Adequate for Discharge Goal: Will verbalize feelings Outcome: Adequate for Discharge   Problem: Health Behavior/Discharge Planning: Goal: Ability to make decisions will improve Outcome: Adequate for Discharge Goal: Compliance with therapeutic regimen will improve Outcome: Adequate for Discharge   Problem: Role Relationship: Goal: Will demonstrate positive changes in social behaviors and relationships Outcome: Adequate for Discharge   Problem: Safety: Goal: Ability to disclose and discuss suicidal ideas will improve Outcome: Adequate for Discharge Goal: Ability to identify and utilize support systems that promote safety will improve Outcome: Adequate for Discharge   Problem: Self-Concept: Goal: Will verbalize positive feelings about self Outcome: Adequate for Discharge Goal: Level of anxiety will decrease Outcome: Adequate for Discharge

## 2020-05-14 ENCOUNTER — Other Ambulatory Visit: Payer: Self-pay

## 2020-05-14 ENCOUNTER — Encounter (HOSPITAL_COMMUNITY): Payer: Self-pay

## 2020-05-14 ENCOUNTER — Ambulatory Visit (HOSPITAL_COMMUNITY)
Admission: EM | Admit: 2020-05-14 | Discharge: 2020-05-15 | Disposition: A | Discharge status: Other Acute Inpatient Hospital | Payer: Medicare Other | Attending: Family | Admitting: Family

## 2020-05-14 ENCOUNTER — Telehealth (HOSPITAL_COMMUNITY): Payer: Medicare Other | Admitting: Psychiatry

## 2020-05-14 DIAGNOSIS — Z79899 Other long term (current) drug therapy: Secondary | ICD-10-CM | POA: Insufficient documentation

## 2020-05-14 DIAGNOSIS — Z9049 Acquired absence of other specified parts of digestive tract: Secondary | ICD-10-CM | POA: Diagnosis not present

## 2020-05-14 DIAGNOSIS — R4586 Emotional lability: Secondary | ICD-10-CM | POA: Insufficient documentation

## 2020-05-14 DIAGNOSIS — K219 Gastro-esophageal reflux disease without esophagitis: Secondary | ICD-10-CM | POA: Insufficient documentation

## 2020-05-14 DIAGNOSIS — F17210 Nicotine dependence, cigarettes, uncomplicated: Secondary | ICD-10-CM | POA: Insufficient documentation

## 2020-05-14 DIAGNOSIS — Z20822 Contact with and (suspected) exposure to covid-19: Secondary | ICD-10-CM | POA: Diagnosis not present

## 2020-05-14 DIAGNOSIS — R410 Disorientation, unspecified: Secondary | ICD-10-CM | POA: Diagnosis present

## 2020-05-14 DIAGNOSIS — R440 Auditory hallucinations: Secondary | ICD-10-CM | POA: Insufficient documentation

## 2020-05-14 DIAGNOSIS — F319 Bipolar disorder, unspecified: Secondary | ICD-10-CM | POA: Insufficient documentation

## 2020-05-14 DIAGNOSIS — R45851 Suicidal ideations: Secondary | ICD-10-CM | POA: Diagnosis not present

## 2020-05-14 LAB — COMPREHENSIVE METABOLIC PANEL
ALT: 16 U/L (ref 0–44)
AST: 16 U/L (ref 15–41)
Albumin: 4 g/dL (ref 3.5–5.0)
Alkaline Phosphatase: 93 U/L (ref 38–126)
Anion gap: 11 (ref 5–15)
BUN: 9 mg/dL (ref 6–20)
CO2: 25 mmol/L (ref 22–32)
Calcium: 9.5 mg/dL (ref 8.9–10.3)
Chloride: 105 mmol/L (ref 98–111)
Creatinine, Ser: 0.84 mg/dL (ref 0.44–1.00)
GFR calc Af Amer: >60 mL/min (ref >60)
GFR calc non Af Amer: >60 mL/min (ref >60)
Glucose, Bld: 117 mg/dL — ABNORMAL HIGH (ref 70–99)
Potassium: 3.4 mmol/L — ABNORMAL LOW (ref 3.5–5.1)
Sodium: 141 mmol/L (ref 135–145)
Total Bilirubin: 0.4 mg/dL (ref 0.3–1.2)
Total Protein: 7.4 g/dL (ref 6.5–8.1)

## 2020-05-14 LAB — GLUCOSE, CAPILLARY: Glucose-Capillary: 123 mg/dL — ABNORMAL HIGH (ref 70–99)

## 2020-05-14 LAB — MAGNESIUM: Magnesium: 2 mg/dL (ref 1.7–2.4)

## 2020-05-14 LAB — CBC WITH DIFFERENTIAL/PLATELET
Abs Immature Granulocytes: 0.03 10*3/uL (ref 0.00–0.07)
Basophils Absolute: 0 10*3/uL (ref 0.0–0.1)
Basophils Relative: 0 %
Eosinophils Absolute: 0 10*3/uL (ref 0.0–0.5)
Eosinophils Relative: 0 %
HCT: 45.9 % (ref 36.0–46.0)
Hemoglobin: 14.8 g/dL (ref 12.0–15.0)
Immature Granulocytes: 0 %
Lymphocytes Relative: 20 %
Lymphs Abs: 1.8 10*3/uL (ref 0.7–4.0)
MCH: 30 pg (ref 26.0–34.0)
MCHC: 32.2 g/dL (ref 30.0–36.0)
MCV: 92.9 fL (ref 80.0–100.0)
Monocytes Absolute: 0.6 10*3/uL (ref 0.1–1.0)
Monocytes Relative: 6 %
Neutro Abs: 6.9 10*3/uL (ref 1.7–7.7)
Neutrophils Relative %: 74 %
Platelets: 340 10*3/uL (ref 150–400)
RBC: 4.94 MIL/uL (ref 3.87–5.11)
RDW: 12.9 % (ref 11.5–15.5)
WBC: 9.3 10*3/uL (ref 4.0–10.5)
nRBC: 0 % (ref 0.0–0.2)

## 2020-05-14 LAB — POCT URINE DRUG SCREEN - MANUAL ENTRY (I-SCREEN)
POC Amphetamine UR: NOT DETECTED
POC Buprenorphine (BUP): NOT DETECTED
POC Cocaine UR: NOT DETECTED
POC Marijuana UR: POSITIVE
POC Methadone UR: NOT DETECTED
POC Methamphetamine UR: NOT DETECTED
POC Morphine: NOT DETECTED
POC Oxazepam (BZO): NOT DETECTED
POC Oxycodone UR: NOT DETECTED
POC Secobarbital (BAR): NOT DETECTED

## 2020-05-14 LAB — LIPID PANEL
Cholesterol: 293 mg/dL — ABNORMAL HIGH (ref 0–200)
HDL: 59 mg/dL (ref >40)
LDL Cholesterol: 205 mg/dL — ABNORMAL HIGH (ref 0–99)
Total CHOL/HDL Ratio: 5 RATIO
Triglycerides: 143 mg/dL (ref <150)
VLDL: 29 mg/dL (ref 0–40)

## 2020-05-14 LAB — SARS CORONAVIRUS 2 BY RT PCR (HOSPITAL ORDER, PERFORMED IN ~~LOC~~ HOSPITAL LAB): SARS Coronavirus 2: NEGATIVE

## 2020-05-14 LAB — HEMOGLOBIN A1C
Hgb A1c MFr Bld: 5.9 % — ABNORMAL HIGH (ref 4.8–5.6)
Mean Plasma Glucose: 122.63 mg/dL

## 2020-05-14 LAB — ETHANOL: Alcohol, Ethyl (B): <10 mg/dL (ref <10)

## 2020-05-14 LAB — TSH: TSH: 0.465 u[IU]/mL (ref 0.350–4.500)

## 2020-05-14 LAB — POC SARS CORONAVIRUS 2 AG: SARS Coronavirus 2 Ag: NEGATIVE

## 2020-05-14 MED ORDER — TOPIRAMATE 25 MG PO TABS
150.0000 mg | ORAL_TABLET | Freq: Every day | ORAL | Status: DC
  Administered 2020-05-14: 150 mg via ORAL
  Filled 2020-05-14: qty 1

## 2020-05-14 MED ORDER — CITALOPRAM HYDROBROMIDE 20 MG PO TABS
40.0000 mg | ORAL_TABLET | Freq: Every day | ORAL | Status: DC
  Administered 2020-05-14 – 2020-05-15 (×2): 40 mg via ORAL
  Filled 2020-05-14 (×3): qty 2

## 2020-05-14 MED ORDER — TRAZODONE HCL 50 MG PO TABS: 50.0000 mg | ORAL_TABLET | Freq: Every evening | ORAL | Status: DC | PRN

## 2020-05-14 MED ORDER — HYDROXYZINE HCL 25 MG PO TABS: 25.0000 mg | ORAL_TABLET | Freq: Three times a day (TID) | ORAL | Status: DC | PRN

## 2020-05-14 MED ORDER — ALUM & MAG HYDROXIDE-SIMETH 200-200-20 MG/5ML PO SUSP: 30.0000 mL | ORAL | Status: DC | PRN

## 2020-05-14 MED ORDER — GABAPENTIN 300 MG PO CAPS
600.0000 mg | ORAL_CAPSULE | Freq: Three times a day (TID) | ORAL | Status: DC
  Administered 2020-05-14 – 2020-05-15 (×2): 600 mg via ORAL
  Filled 2020-05-14 (×3): qty 2

## 2020-05-14 MED ORDER — PANTOPRAZOLE SODIUM 40 MG PO TBEC
40.0000 mg | DELAYED_RELEASE_TABLET | Freq: Every day | ORAL | Status: DC
  Administered 2020-05-14 – 2020-05-15 (×2): 40 mg via ORAL
  Filled 2020-05-14 (×3): qty 1

## 2020-05-14 MED ORDER — ACETAMINOPHEN 325 MG PO TABS: 650.0000 mg | ORAL_TABLET | Freq: Four times a day (QID) | ORAL | Status: DC | PRN

## 2020-05-14 MED ORDER — QUETIAPINE FUMARATE 300 MG PO TABS: 600.0000 mg | ORAL_TABLET | Freq: Every day | ORAL | Status: DC

## 2020-05-14 MED ORDER — MAGNESIUM HYDROXIDE 400 MG/5ML PO SUSP: 30.0000 mL | Freq: Every day | ORAL | Status: DC | PRN

## 2020-05-14 MED ORDER — OXCARBAZEPINE 300 MG PO TABS
450.0000 mg | ORAL_TABLET | Freq: Two times a day (BID) | ORAL | Status: DC
  Administered 2020-05-14 – 2020-05-15 (×2): 450 mg via ORAL
  Filled 2020-05-14 (×3): qty 1

## 2020-05-14 NOTE — BH Assessment (Signed)
Comprehensive Clinical Assessment (CCA) Note  05/14/2020 [name] [id number]   Patient is a 49 year old female presenting voluntarily to [facility] with a chief complaint of suicidal ideation and concerns about her medications. She states "I cannot keep my medications straight. My Seroquel was increased. I'm confused. Sometimes I take too much or I don't take enough." Patient reports suicidal ideation with a plan to overdose or shoot herself. Patient was d/c from [facility] on 8/20 and since then has struggled with confusion and lapses in memory. She states she feels "a break from reality." Patient denies HI. She states she is unsure if she is experiencing AH but states she will be talking to someone and realize no one is there. Patient has never experienced this before. Patient denies any current substance use but admits to a history of polysubstance abuse. Patient states she does not have any supports locally but has a cousin in [place] she is close to. Patient states she continues to struggle with grief from the her parent's deaths 2005/11/23 and Nov 24, 2014) as well as the death of an uncle in 26-Sep-2019.  Michelle Libra, FNP recommends patient be admitted to continuous assessment for observation. Patient agreeable to this plan of care.  Visit Diagnosis:   F31.9 Bipolar I (per history)    ICD-10-CM   1. Bipolar 1 disorder (HCC)  F31.9       CCA Screening, Triage and Referral (STR)  Patient Reported Information How did you hear about [place]? Self  Referral name: [name]  Referral phone number: No data recorded  Whom do you see for routine medical problems? Primary Care  Practice/Facility Name: [facility]  Practice/Facility Phone Number: No data recorded Name of Contact: Dr. Alva Garnet Number: No data recorded Contact Fax Number: No data recorded Prescriber Name: No data recorded Prescriber Address (if known): No data recorded  What Is the Reason for Your Visit/Call Today? suicidal  thoughts/ medication change  How Long Has This Been Causing You Problems? 1 wk - 1 month  What Do You Feel Would Help You the Most Today? Therapy;Medication   Have You Recently Been in Any Inpatient Treatment (Hospital/Detox/Crisis Center/28-Day Program)? Yes  Name/Location of Program/Hospital:[facility]  How Long Were You There? 8 days  When Were You Discharged? 04/17/20   Have You Ever Received Services From [employer] Before? Yes  Who Do You See at [facility]? inpatient [facility]   Have You Recently Had Any Thoughts About Hurting Yourself? Yes  Are You Planning to Commit Suicide/Harm Yourself At This time? Yes   Have you Recently Had Thoughts About Hurting Someone [place]? No  Explanation: No data recorded  Have You Used Any Alcohol or Drugs in the Past 24 Hours? No  How Long Ago Did You Use Drugs or Alcohol? No data recorded What Did You Use and How Much? No data recorded  Do You Currently Have a Therapist/Psychiatrist? No  Name of Therapist/Psychiatrist: No data recorded  Have You Been Recently Discharged From Any Office Practice or Programs? No  Explanation of Discharge From Practice/Program: No data recorded    CCA Screening Triage Referral Assessment Type of Contact: Face-to-Face  Is this Initial or Reassessment? No data recorded Date Telepsych consult ordered in [facility]:  No data recorded Time Telepsych consult ordered in [facility]:  No data recorded  Patient Reported Information Reviewed? Yes  Patient Left Without Being Seen? No data recorded Reason for Not Completing Assessment: No data recorded  Collateral Involvement: NA  Does Patient Have a Stage manager Guardian? No data recorded Name and Contact of Legal Guardian: No data recorded If Minor and Not Living with Parent(s), Who has Custody? No data recorded Is CPS involved or ever been involved? Never  Is APS involved or ever been involved? Never   Patient Determined To Be At Risk for Harm To Self  or Others Based on Review of Patient Reported Information or Presenting Complaint? Yes, for Self-Harm  Method: No data recorded Availability of Means: No data recorded Intent: No data recorded Notification Required: No data recorded Additional Information for Danger to Others Potential: No data recorded Additional Comments for Danger to Others Potential: No data recorded Are There Guns or Other Weapons in Your Home? No data recorded Types of Guns/Weapons: No data recorded Are These Weapons Safely Secured?                            No data recorded Who Could Verify You Are Able To Have These Secured: No data recorded Do You Have any Outstanding Charges, Pending Court Dates, Parole/Probation? No data recorded Contacted To Inform of Risk of Harm To Self or Others: No data recorded  Location of Assessment: [facility]   Does Patient Present under Involuntary Commitment? No  IVC Papers Initial File Date: No data recorded  [place] of Residence: [place]   Patient Currently Receiving the Following Services: Medication Management   Determination of Need: Urgent (48 hours)   Options For Referral: BH Urgent Care     CCA Biopsychosocial  Intake/Chief Complaint:  CCA Intake With Chief Complaint CCA Part Two Date: 05/14/20 CCA Part Two Time: 1738 Chief Complaint/Presenting Problem: Despoindent; anxious; SI without plan; confusion, memory loss Patient's Currently Reported Symptoms/Problems: Pt stopped taking medication (including psychotropic meds) ''cold-turkey'' about two weeks ago Type of Services Patient Feels Are Needed: Inpatient Initial Clinical Notes/Concerns: Pt came off of medication cold-turkey, appears to be entering into state of Bipolar I, Depressed  Mental Health Symptoms Depression:  Depression: Change in energy/activity, Hopelessness, Fatigue, Tearfulness, Duration of symptoms less than two weeks, Increase/decrease in appetite, Difficulty  Concentrating, Sleep (too much or little)  Mania:  Mania: None  Anxiety:   Anxiety: Difficulty concentrating, Irritability, Restlessness, Worrying, Tension, Sleep  Psychosis:  Psychosis: None  Trauma:  Trauma: None  Obsessions:  Obsessions: None  Compulsions:  Compulsions: None  Inattention:  Inattention: None  Hyperactivity/Impulsivity:  Hyperactivity/Impulsivity: N/A  Oppositional/Defiant Behaviors:  Oppositional/Defiant Behaviors: None  Emotional Irregularity:  Emotional Irregularity: None  Other Mood/Personality Symptoms:      Mental Status Exam Appearance and self-care  Stature:  Stature: Average  Weight:  Weight: Average weight  Clothing:  Clothing: Casual  Grooming:  Grooming: Normal  Cosmetic use:  Cosmetic Use: Age appropriate  Posture/gait:  Posture/Gait: Normal  Motor activity:  Motor Activity: Not Remarkable  Sensorium  Attention:  Attention: Normal  Concentration:  Concentration: Anxiety interferes  Orientation:  Orientation: X5  Recall/memory:  Recall/Memory: Normal  Affect and Mood  Affect:  Affect: Depressed  Mood:  Mood: Depressed, Anxious  Relating  Eye contact:  Eye Contact: Normal  Facial expression:  Facial Expression: Sad  Attitude toward examiner:  Attitude Toward Examiner: Cooperative  Thought and Language  Speech flow: Speech Flow: Clear and Coherent  Thought content:  Thought Content: Appropriate to Mood and Circumstances  Preoccupation:  Preoccupations: None  Hallucinations:  Hallucinations: None  Organization:     Community education officer  Fund of Knowledge:  Fund of Knowledge: Average  Intelligence:  Intelligence: Average  Abstraction:  Abstraction: Normal  Judgement:  Judgement: Poor  Reality Testing:  Reality Testing: Adequate  Insight:  Insight: Fair  Decision Making:  Decision Making: Normal  Social Functioning  Social Maturity:  Social Maturity: Isolates  Social Judgement:  Social Judgement: Normal  Stress  Stressors:  Stressors: Other  (Comment) (Off of medication)  Coping Ability:  Coping Ability: Overwhelmed  Skill Deficits:  Skill Deficits: None  Supports:  Supports: Family     Religion: Religion/Spirituality Are You A Religious Person?: No  Leisure/Recreation: Leisure / Recreation Do You Have Hobbies?: No  Exercise/Diet: Exercise/Diet Do You Exercise?: No Have You Gained or Lost A Significant Amount of Weight in the Past Six Months?: No Do You Follow a Special Diet?: No Do You Have Any Trouble Sleeping?: Yes Explanation of Sleeping Difficulties: 1-2 hours of sleep per night over last two weeks   CCA Employment/Education  Employment/Work Situation: Employment / Work Situation Employment situation: On disability Why is patient on disability: Bipolar Disorder;Social Anxiety Patient's job has been impacted by current illness: No Has patient ever been in the [employer]?: No  Education: Education Is Patient Currently Attending School?: No Last Grade Completed: 12 Name of High School: [employer] Did Teacher, adult education From [employer]?: Yes Did You Attend College?: No Did [place]?: No Did You Have An Individualized Education Program (IIEP): No Did You Have Any Difficulty At School?: No Patient's Education Has Been Impacted by Current Illness: No   CCA Family/Childhood History  Family and Relationship History: Family history Marital status: Long term relationship What types of issues is patient dealing with in the relationship?: Supportive relationship Are you sexually active?: Yes What is your sexual orientation?: Heterosexual Does patient have children?: No  Childhood History:  Childhood History By whom was/is the patient raised?: Both parents Additional childhood history information: none noted Description of patient's relationship with caregiver when they were a child: fine Patient's description of current relationship with people who raised him/her: states it is strained Does  patient have siblings?: No Did patient suffer any verbal/emotional/physical/sexual abuse as a child?: No Did patient suffer from severe childhood neglect?: No Has patient ever been sexually abused/assaulted/raped as an adolescent or adult?: No Was the patient ever a victim of a crime or a disaster?: No Witnessed domestic violence?: No Has patient been affected by domestic violence as an adult?: No  Child/Adolescent Assessment:     CCA Substance Use  Alcohol/Drug Use: Alcohol / Drug Use Pain Medications: See MAR Prescriptions: See MAR Over the Counter: See MAR History of alcohol / drug use?: Yes Substance #1 Name of Substance 1: Marijuana 1 - Amount (size/oz): Varied 1 - Duration: Rarely 1 - Last Use / Amount: 1.5 months ago                       ASAM's:  Six Dimensions of Multidimensional Assessment  Dimension 1:  Acute Intoxication and/or Withdrawal Potential:      Dimension 2:  Biomedical Conditions and Complications:      Dimension 3:  Emotional, Behavioral, or Cognitive Conditions and Complications:     Dimension 4:  Readiness to Change:     Dimension 5:  Relapse, Continued use, or Continued Problem Potential:     Dimension 6:  Recovery/Living Environment:     ASAM Severity Score:    ASAM Recommended Level of Treatment:     Substance  use Disorder (SUD)    Recommendations for Services/Supports/Treatments:    DSM5 Diagnoses: Patient Active Problem List   Diagnosis Date Noted  . PTSD (post-traumatic stress disorder) 04/09/2020  . Severe bipolar I disorder, current or most recent episode depressed ([place]) 04/08/2020  . Opiate dependence ([place]) 04/08/2020  . Bipolar 1 disorder ([place]) 04/08/2020  . Inadequate sleep hygiene 04/06/2017  . Snoring 04/06/2017  . Sleep apnea 04/06/2017  . Sleep deprivation 04/06/2017  . Genetic testing 08/24/2016  . Family history of ovarian cancer   . Family history of uterine cancer   . Family history of prostate cancer    . Dysphagia 06/23/2016  . Cellulitis 03/02/2016  . Loss of weight 02/04/2016  . Diarrhea of presumed infectious origin 02/04/2016  . Generalized abdominal pain 02/04/2016  . Internal hemorrhoid, bleeding 04/24/2014  . Irregular menstrual cycle 06/22/2012  . TOBACCO ABUSE 10/19/2009  . SYNCOPE 10/19/2009  . CHEST PAIN 10/19/2009  . SYNCOPE, HX OF 10/19/2009  . CANDIDIASIS OF THE ESOPHAGUS 06/17/2009  . LEG PAIN, LEFT 12/22/2008  . HAIR LOSS 06/03/2008  . HEMORRHOIDS, INTERNAL, WITH BLEEDING 11/01/2007  . VITAMIN B12 DEFICIENCY 09/05/2007  . DYSPLASTIC NEVUS 08/14/2007  . THROMBOCYTOSIS 07/03/2007  . Diarrhea 07/03/2007  . 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 HYPERCHOLESTEROLEMIA 05/31/2007  . DISORDER, BIPOLAR NEC 05/31/2007  . PSORIASIS 05/31/2007  . INSOMNIA 05/31/2007  . COCAINE ABUSE 05/14/2007  . PERIODONTAL DISEASE 05/14/2007  . Gastritis and gastroduodenitis 05/14/2007  . DIVERTICULOSIS, COLON, HX OF 05/14/2007  . MYALGIA, [place] OF 05/14/2007    Patient Centered Plan: Patient is on the following Treatment Plan(s):     Referrals to Alternative Service(s): Referred to Alternative Service(s):   Place:   Date:   Time:    Referred to Alternative Service(s):   Place:   Date:   Time:    Referred to Alternative Service(s):   Place:   Date:   Time:    Referred to Alternative Service(s):   Place:   Date:   Time:     Orvis Brill

## 2020-05-14 NOTE — ED Notes (Signed)
No belongings

## 2020-05-14 NOTE — ED Provider Notes (Signed)
Behavioral Health Admission H&P [facility] & OBS)  Date: 05/14/20 Patient Name: [name] MRN: [id number] Chief Complaint:  Chief Complaint  Patient presents with  . Suicidal   Chief Complaint/Presenting Problem: Despoindent; anxious; SI without plan; confusion, memory loss  Diagnoses:  Final diagnoses:  Bipolar 1 disorder ([place])    HPI: Patient presents voluntarily to [facility] for walk-in assessment.  Patient states "I really need to get control of what is happening and I am not even aware of what is going on."  Patient reports that she is not managing her medications well and "I take too much of 1 and not enough of another."  Patient reports she is unable to recall when she has taken her medication and subsequently takes meds several times during the same 24-hour period.  Patient reports inability to manage medications has resulted in increased confusion and lapses in memory.  Patient assessed by nurse practitioner.  Patient alert and oriented, actively participates in assessment.  Patient endorses suicidal ideations with a plan to overdose on medications.  Patient reports feeling suicidal x1 month.  Patient denies homicidal ideations.  Patient reports auditory hallucinations, states "I hear people talking about me all the time."  Patient denies visual hallucinations.  There is no evidence of delusional thought content and patient does not appear to be responding to internal stimuli.  Patient denies symptoms of paranoia.  Patient reports difficulty sleeping and decreased appetite x1 month.  Patient reports that her uncle died in 2019/09/24 and she has been feeling increasingly depressed and suicidal since that time.  Patient reports worsening suicidal ideations x1 month.  Patient resides alone in [place].  Patient denies access to weapons.  Patient reports she is currently not employed.  Patient denies alcohol and substance use.  Patient reports she  is followed by  [facility].  Patient denies outpatient psychiatrist.  Patient offered support and encouragement.  PHQ 2-9:    ED from 04/08/2020 in [facility]  Thoughts that you would be better off dead, or of hurting yourself in some way More than half the days  PHQ-9 Total Score 16        Admission (Discharged) from 04/09/2020 in [place] [facility]  C-SSRS RISK CATEGORY High Risk       Total Time spent with patient: 20 minutes  Musculoskeletal  Strength & Muscle Tone: within normal limits Gait & Station: normal Patient leans: N/A  Psychiatric Specialty Exam  Presentation General Appearance: Appropriate for Environment  Eye Contact:Fair  Speech:Clear and Coherent;Normal Rate  Speech Volume:Normal  Handedness:Right   Mood and Affect  Mood:Depressed  Affect:Tearful;Depressed   Thought Process  Thought Processes:Coherent;Goal Directed  Descriptions of Associations:Intact  Orientation:Full (Time, Place and Person)  Thought Content:WDL  Hallucinations:Hallucinations: Auditory Description of Auditory Hallucinations: "I hear people talking about me all the time"  Ideas of Reference:None  Suicidal Thoughts:Suicidal Thoughts: Yes, Active SI Active Intent and/or Plan: Without Intent;With Plan  Homicidal Thoughts:Homicidal Thoughts: No   Sensorium  Memory:Immediate Fair;Recent Fair;Remote Fair  Judgment:Fair  Insight:Fair   Executive Functions  Concentration:Fair  Attention Span:Fair  [place]   Psychomotor Activity  Psychomotor Activity:Psychomotor Activity: Normal   Assets  Assets:Communication Skills;Desire for Improvement;Financial Resources/Insurance;Housing;Intimacy;Leisure Time;Physical Health;Resilience;Social Support;Talents/Skills;Transportation   Sleep  Sleep:Sleep: Fair   Physical Exam Vitals and nursing note  reviewed.  Constitutional:      Appearance: She is well-developed.  HENT:  Head: Normocephalic.  Cardiovascular:     Rate and Rhythm: Normal rate.  Pulmonary:     Effort: Pulmonary effort is normal.  Neurological:     Mental Status: She is alert and oriented to person, place, and time.  Psychiatric:        Attention and Perception: Attention normal. She perceives auditory hallucinations.        Mood and Affect: Mood is depressed. Affect is tearful.        Speech: Speech normal.        Behavior: Behavior normal. Behavior is cooperative.        Thought Content: Thought content includes suicidal ideation. Thought content includes suicidal plan.        Cognition and Memory: Cognition and memory normal.        Judgment: Judgment normal.    Review of Systems  Constitutional: Negative.   HENT: Negative.   Eyes: Negative.   Respiratory: Negative.   Cardiovascular: Negative.   Gastrointestinal: Negative.   Genitourinary: Negative.   Musculoskeletal: Negative.   Skin: Negative.   Neurological: Negative.   Endo/Heme/Allergies: Negative.   Psychiatric/Behavioral: Positive for depression, hallucinations and suicidal ideas.    Blood pressure (!) 135/98, pulse 98, temperature 98 F (36.7 C), temperature source Tympanic, resp. rate 16, last menstrual period 06/14/2012, SpO2 98 %. There is no height or weight on file to calculate BMI.  Past Psychiatric History: PTSD, bipolar 1 disorder, cocaine abuse, opiate dependence  Is the patient at risk to self? Yes  Has the patient been a risk to self in the past 6 months? Yes .    Has the patient been a risk to self within the distant past? Yes   Is the patient a risk to others? No   Has the patient been a risk to others in the past 6 months? No   Has the patient been a risk to others within the distant past? No   Past Medical History:  Past Medical History:  Diagnosis Date  . Anxiety   . Arthritis    left knee  . Astigmatism    both  eyes  . Cancer ([place])    melanoma removed from back   . Cataract    left eye  . Depression   . Diverticulosis   . Erosive esophagitis   . Family history of ovarian cancer   . Family history of prostate cancer   . Family history of uterine cancer   . 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 GERD (gastroesophageal reflux disease)   . H/O urinary infection   . Headache   . History of hiatal hernia   . IBS (irritable bowel syndrome)    no current med.  . Jaundice   . MRSA (methicillin resistant Staphylococcus aureus)    hospitalized for 48 hours in Dec 2016  . Staph infection    "in my blood"    Past Surgical History:  Procedure Laterality Date  . CHOLECYSTECTOMY  08/02/2012   Procedure: LAPAROSCOPIC CHOLECYSTECTOMY WITH INTRAOPERATIVE CHOLANGIOGRAM;  Surgeon: Rolm Bookbinder, MD;  Location: [place];  Service: General;  Laterality: N/A;  . COLONOSCOPY WITH PROPOFOL  04/09/2014  . COLONOSCOPY WITH PROPOFOL N/A 07/14/2016   Procedure: COLONOSCOPY WITH PROPOFOL;  Surgeon: Milus Banister, MD;  Location[facility];  Service: Endoscopy;  Laterality: N/A;  . DILATION AND CURETTAGE OF UTERUS     w/ hysteroscopy to remove cyst  . ESOPHAGOGASTRODUODENOSCOPY N/A 12/02/2017  Procedure: ESOPHAGOGASTRODUODENOSCOPY (EGD);  Surgeon: Carol Ada, MD;  Location: [facility];  Service: Endoscopy;  Laterality: N/A;  . ESOPHAGOGASTRODUODENOSCOPY (EGD) WITH PROPOFOL  04/09/2014  . ESOPHAGOGASTRODUODENOSCOPY (EGD) WITH PROPOFOL N/A 07/14/2016   Procedure: ESOPHAGOGASTRODUODENOSCOPY (EGD) WITH PROPOFOL;  Surgeon: Milus Banister, MD;  [facility];  Service: Endoscopy;  Laterality: N/A;  . HYSTEROSCOPY WITH D & C  06/22/2012   Procedure: DILATATION AND CURETTAGE /HYSTEROSCOPY;  Surgeon: Lahoma Crocker, MD;  Location: [facility];  Service: Gynecology;  Laterality: N/A;  . KNEE ARTHROSCOPY  2011   left  . ORIF TOE FRACTURE Left 11/05/2014   Procedure: OPEN  REDUCTION INTERNAL FIXATION (ORIF) LEFT FIFTH METATARSAL (TOE) FRACTURE WITH CALCANEAL BONE GRAFT;  Surgeon: Dorna Leitz, MD;  Location: [place];  Service: Orthopedics;  Laterality: Left;  . WISDOM TOOTH EXTRACTION      Family History:  Family History  Problem Relation Age of Onset  . Heart disease Father   . Stroke Father   . Heart attack Father   . Skin cancer Father   . Diabetes Father   . Uterine cancer Mother 41  . COPD Mother   . Cancer - Colon Maternal Grandfather        mets to bone  . Stomach cancer Paternal Grandmother   . Cervical cancer Sister        dx between 39-33  . Migraines Sister   . Prostate cancer Maternal Uncle 59       mets to bone  . Ovarian cancer Paternal Aunt   . Heart attack Paternal Grandfather   . Skin cancer Paternal Aunt     Social History:  Social History   Socioeconomic History  . Marital status: Significant Other    Spouse name: Not on file  . Number of children: 0  . Years of education: College  . Highest education level: Not on file  Occupational History  . Occupation: Disability  Tobacco Use  . Smoking status: Current Every Day Smoker    Packs/day: 1.00    Years: 33.00    Pack years: 33.00    Types: Cigarettes  . Smokeless tobacco: Never Used  . Tobacco comment: form given 02-04-16   Substance and Sexual Activity  . Alcohol use: No    Alcohol/week: 0.0 standard drinks  . Drug use: No    Types: Marijuana    Comment: history of use  . Sexual activity: Yes    Birth control/protection: Post-menopausal  Other Topics Concern  . Not on file  Social History Narrative   Lives at home w/ significant other   Right-handed   Caffeine: coffee "all day"   Social Determinants of Health   Financial Resource Strain:   . Difficulty of Paying Living Expenses: Not on file  Food Insecurity:   . Worried About Charity fundraiser in the Last Year: Not on file  . Ran Out of Food in the Last Year: Not on file   Transportation Needs:   . Lack of Transportation (Medical): Not on file  . Lack of Transportation (Non-Medical): Not on file  Physical Activity:   . Days of Exercise per Week: Not on file  . Minutes of Exercise per Session: Not on file  Stress:   . Feeling of Stress : Not on file  Social Connections:   . Frequency of Communication with Friends and Family: Not on file  . Frequency of Social Gatherings with Friends and Family: Not on file  . Attends Religious  Services: Not on file  . Active Member of Clubs or Organizations: Not on file  . Attends Archivist Meetings: Not on file  . Marital Status: Not on file  Intimate Partner Violence:   . Fear of Current or Ex-Partner: Not on file  . Emotionally Abused: Not on file  . Physically Abused: Not on file  . Sexually Abused: Not on file    SDOH:  SDOH Screenings   Alcohol Screen: Low Risk   . Last Alcohol Screening Score (AUDIT): 0  Depression (PHQ2-9): Medium Risk  . PHQ-2 Score: 16  Financial Resource Strain:   . Difficulty of Paying Living Expenses: Not on file  Food Insecurity:   . Worried About Charity fundraiser in the Last Year: Not on file  . Ran Out of Food in the Last Year: Not on file  Housing:   . Last Housing Risk Score: Not on file  Physical Activity:   . Days of Exercise per Week: Not on file  . Minutes of Exercise per Session: Not on file  Social Connections:   . Frequency of Communication with Friends and Family: Not on file  . Frequency of Social Gatherings with Friends and Family: Not on file  . Attends Religious Services: Not on file  . Active Member of Clubs or Organizations: Not on file  . Attends Archivist Meetings: Not on file  . Marital Status: Not on file  Stress:   . Feeling of Stress : Not on file  Tobacco Use: High Risk  . Smoking Tobacco Use: Current Every Day Smoker  . Smokeless Tobacco Use: Never Used  Transportation Needs:   . Film/video editor (Medical): Not  on file  . Lack of Transportation (Non-Medical): Not on file    Last Labs:  Admission on 04/09/2020, Discharged on 04/17/2020  Component Date Value Ref Range Status  . T3, Total 04/09/2020 84  71 - 180 ng/dL Final   Comment: (NOTE) Performed At: [facility] [place], [place] [id number] Rush Farmer MD ID:[id number]   . T4, Total 04/09/2020 6.2  4.5 - 12.0 ug/dL Final   Comment: (NOTE) Performed At: [facility] [place], [place] [id number] Rush Farmer MD QP:[id number]   Admission on 04/08/2020, Discharged on 04/08/2020  Component Date Value Ref Range Status  . POC Amphetamine UR 04/08/2020 None Detected  None Detected Final  . POC Secobarbital (BAR) 04/08/2020 None Detected  None Detected Final  . POC Buprenorphine (BUP) 04/08/2020 None Detected  None Detected Final  . POC Oxazepam (BZO) 04/08/2020 None Detected  None Detected Final  . POC Cocaine UR 04/08/2020 None Detected  None Detected Final  . POC Methamphetamine UR 04/08/2020 None Detected  None Detected Final  . POC Morphine 04/08/2020 None Detected  None Detected Final  . POC Oxycodone UR 04/08/2020 None Detected  None Detected Final  . POC Methadone UR 04/08/2020 None Detected  None Detected Final  . POC Marijuana UR 04/08/2020 Positive* None Detected Final  . SARS Coronavirus 2 Ag 04/08/2020 Negative  Negative Final  . WBC 04/08/2020 9.9  4.0 - 10.5 K/uL Final  . RBC 04/08/2020 5.31* 3.87 - 5.11 MIL/uL Final  . Hemoglobin 04/08/2020 16.3* 12.0 - 15.0 g/dL Final  . HCT 04/08/2020 51.2* 36 - 46 % Final  . MCV 04/08/2020 96.4  80.0 - 100.0 fL Final  . MCH 04/08/2020 30.7  26.0 - 34.0 pg Final  . MCHC 04/08/2020 31.8  30.0 -  36.0 g/dL Final  . RDW 04/08/2020 13.6  11.5 - 15.5 % Final  . Platelets 04/08/2020 381  150 - 400 K/uL Final  . nRBC 04/08/2020 0.0  0.0 - 0.2 % Final  . Neutrophils Relative % 04/08/2020 73  % Final  . Neutro Abs 04/08/2020 7.2  1.7 - 7.7 K/uL  Final  . Lymphocytes Relative 04/08/2020 20  % Final  . Lymphs Abs 04/08/2020 1.9  0.7 - 4.0 K/uL Final  . Monocytes Relative 04/08/2020 6  % Final  . Monocytes Absolute 04/08/2020 0.6  0 - 1 K/uL Final  . Eosinophils Relative 04/08/2020 0  % Final  . Eosinophils Absolute 04/08/2020 0.0  0 - 0 K/uL Final  . Basophils Relative 04/08/2020 1  % Final  . Basophils Absolute 04/08/2020 0.1  0 - 0 K/uL Final  . Immature Granulocytes 04/08/2020 0  % Final  . Abs Immature Granulocytes 04/08/2020 0.03  0.00 - 0.07 K/uL Final   Performed at [facility], [place] [address], [place], [place] [address]  . Sodium 04/08/2020 139  135 - 145 mmol/L Final  . Potassium 04/08/2020 3.8  3.5 - 5.1 mmol/L Final  . Chloride 04/08/2020 108  98 - 111 mmol/L Final  . CO2 04/08/2020 19* 22 - 32 mmol/L Final  . Glucose, Bld 04/08/2020 118* 70 - 99 mg/dL Final   Glucose reference range applies only to samples taken after fasting for at least 8 hours.  . BUN 04/08/2020 8  6 - 20 mg/dL Final  . Creatinine, Ser 04/08/2020 0.93  0.44 - 1.00 mg/dL Final  . Calcium 04/08/2020 9.5  8.9 - 10.3 mg/dL Final  . Total Protein 04/08/2020 7.6  6.5 - 8.1 g/dL Final  . Albumin 04/08/2020 4.2  3.5 - 5.0 g/dL Final  . AST 04/08/2020 26  15 - 41 U/L Final  . ALT 04/08/2020 42  0 - 44 U/L Final  . Alkaline Phosphatase 04/08/2020 91  38 - 126 U/L Final  . Total Bilirubin 04/08/2020 0.4  0.3 - 1.2 mg/dL Final  . GFR calc non Af Amer 04/08/2020 >60  >60 mL/min Final  . GFR calc Af Amer 04/08/2020 >60  >60 mL/min Final  . Anion gap 04/08/2020 12  5 - 15 Final   Performed at [place] [address] [place], [place] [address]  . TSH 04/08/2020 0.325* 0.350 - 4.500 uIU/mL Final   Comment: Performed by a 3rd Generation assay with a functional sensitivity of <=0.01 uIU/mL. Performed at [facility], [place] [address], [place], [place] [address]   . Hgb A1c MFr Bld 04/08/2020 5.7* 4.8 - 5.6 % Final   Comment: (NOTE) Pre  diabetes:          5.7%-6.4%  Diabetes:              >6.4%  Glycemic control for   <7.0% adults with diabetes   . Mean Plasma Glucose 04/08/2020 116.89  mg/dL Final   Performed at [place] [address] [place], [place] [address]  . Cholesterol 04/08/2020 252* 0 - 200 mg/dL Final  . Triglycerides 04/08/2020 96  <150 mg/dL Final  . HDL 04/08/2020 50  >40 mg/dL Final  . Total CHOL/HDL Ratio 04/08/2020 5.0  RATIO Final  . VLDL 04/08/2020 19  0 - 40 mg/dL Final  . LDL Cholesterol 04/08/2020 183* 0 - 99 mg/dL Final   Comment:        Total Cholesterol/HDL:CHD Risk  Coronary Heart Disease Risk Table                     Men   Women  1/2 Average Risk   3.4   3.3  Average Risk       5.0   4.4  2 X Average Risk   9.6   7.1  3 X Average Risk  23.4   11.0        Use the calculated Patient Ratio above and the CHD Risk Table to determine the patient's CHD Risk.        ATP III CLASSIFICATION (LDL):  <100     mg/dL   Optimal  100-129  mg/dL   Near or Above                    Optimal  130-159  mg/dL   Borderline  160-189  mg/dL   High  >190     mg/dL   Very High Performed at [place] [address], [place], [place] [address]   . SARS Coronavirus 2 04/08/2020 NEGATIVE  NEGATIVE Final   Comment: (NOTE) SARS-CoV-2 target nucleic acids are NOT DETECTED.  The SARS-CoV-2 RNA is generally detectable in upper and lower respiratory specimens during the acute phase of infection. The lowest concentration of SARS-CoV-2 viral copies this assay can detect is 250 copies / mL. A negative result does not preclude SARS-CoV-2 infection and should not be used as the sole basis for treatment or other patient management decisions.  A negative result may occur with improper specimen collection / handling, submission of specimen other than nasopharyngeal swab, presence of viral mutation(s) within the areas targeted by this assay, and inadequate number of viral copies (<250 copies /  mL). A negative result must be combined with clinical observations, patient history, and epidemiological information.  Fact Sheet for Patients:   StrictlyIdeas.no  Fact Sheet for Healthcare Providers: BankingDealers.co.za  This test is not yet approved or                           cleared by the [place] FDA and has been authorized for detection and/or diagnosis of SARS-CoV-2 by FDA under an Emergency Use Authorization (EUA).  This EUA will remain in effect (meaning this test can be used) for the duration of the COVID-19 declaration under Section 564(b)(1) of the Act, 21 U.S.C. section 360bbb-3(b)(1), unless the authorization is terminated or revoked sooner.  Performed at [facility], [place] [address], [place], [place] [address]   . Preg Test, Ur 04/08/2020 NEGATIVE  NEGATIVE Final   Comment:        THE SENSITIVITY OF THIS METHODOLOGY IS >24 mIU/mL   . Glucose, UA 04/08/2020 NEGATIVE  NEGATIVE mg/dL Final  . Bilirubin Urine 04/08/2020 NEGATIVE  NEGATIVE Final  . Ketones, ur 04/08/2020 NEGATIVE  NEGATIVE mg/dL Final  . Specific Gravity, Urine 04/08/2020 >=1.030  1.005 - 1.030 Final  . Hgb urine dipstick 04/08/2020 TRACE* NEGATIVE Final  . pH 04/08/2020 6.0  5.0 - 8.0 Final  . Protein, ur 04/08/2020 NEGATIVE  NEGATIVE mg/dL Final  . Urobilinogen, UA 04/08/2020 0.2  0.0 - 1.0 mg/dL Final  . Nitrite 04/08/2020 NEGATIVE  NEGATIVE Final  . Chalmers Guest 04/08/2020 TRACE* NEGATIVE Final   Biochemical Testing Only. Please order routine urinalysis from main lab if confirmatory testing is needed.  Admission on 02/24/2020, Discharged on 02/24/2020  Component Date  Value Ref Range Status  . Sodium 02/24/2020 139  135 - 145 mmol/L Final  . Potassium 02/24/2020 4.1  3.5 - 5.1 mmol/L Final  . Chloride 02/24/2020 108  98 - 111 mmol/L Final  . CO2 02/24/2020 24  22 - 32 mmol/L Final  . Glucose, Bld 02/24/2020 94  70 - 99 mg/dL Final    Glucose reference range applies only to samples taken after fasting for at least 8 hours.  . BUN 02/24/2020 8  6 - 20 mg/dL Final  . Creatinine, Ser 02/24/2020 1.28* 0.44 - 1.00 mg/dL Final  . Calcium 02/24/2020 8.7* 8.9 - 10.3 mg/dL Final  . Total Protein 02/24/2020 7.0  6.5 - 8.1 g/dL Final  . Albumin 02/24/2020 3.6  3.5 - 5.0 g/dL Final  . AST 02/24/2020 37  15 - 41 U/L Final  . ALT 02/24/2020 43  0 - 44 U/L Final  . Alkaline Phosphatase 02/24/2020 100  38 - 126 U/L Final  . Total Bilirubin 02/24/2020 0.3  0.3 - 1.2 mg/dL Final  . GFR calc non Af Amer 02/24/2020 49* >60 mL/min Final  . GFR calc Af Amer 02/24/2020 57* >60 mL/min Final  . Anion gap 02/24/2020 7  5 - 15 Final   Performed at [facility], [place] [address], [place], [place] [address]  . Alcohol, Ethyl (B) 02/24/2020 <10  <10 mg/dL Final   Comment: (NOTE) Lowest detectable limit for serum alcohol is 10 mg/dL.  For medical purposes only. Performed at [facility], [place] [address] [place], [place] [address]   . Salicylate Lvl 51/09/5850 <7.0* 7.0 - 30.0 mg/dL Final   Performed at [place] [address], [place], [place] [address]  . Acetaminophen (Tylenol), Serum 02/24/2020 <10* 10 - 30 ug/mL Final   Comment: (NOTE) Therapeutic concentrations vary significantly. A range of 10-30 ug/mL  may be an effective concentration for many patients. However, some  are best treated at concentrations outside of this range. Acetaminophen concentrations >150 ug/mL at 4 hours after ingestion  and >50 ug/mL at 12 hours after ingestion are often associated with  toxic reactions.  Performed at [facility], [place] [address], [place], [place] [address]   . WBC 02/24/2020 7.8  4.0 - 10.5 K/uL Final  . RBC 02/24/2020 4.22  3.87 - 5.11 MIL/uL Final  . Hemoglobin 02/24/2020 13.1  12.0 - 15.0 g/dL Final  . HCT 02/24/2020 41.8  36 - 46 % Final  . MCV 02/24/2020  99.1  80.0 - 100.0 fL Final  . MCH 02/24/2020 31.0  26.0 - 34.0 pg Final  . MCHC 02/24/2020 31.3  30.0 - 36.0 g/dL Final  . RDW 02/24/2020 13.5  11.5 - 15.5 % Final  . Platelets 02/24/2020 311  150 - 400 K/uL Final  . nRBC 02/24/2020 0.0  0.0 - 0.2 % Final   Performed at [facility], [place] [address] [place], [place] [address]  . Opiates 02/24/2020 POSITIVE* NONE DETECTED Final  . Cocaine 02/24/2020 NONE DETECTED  NONE DETECTED Final  . Benzodiazepines 02/24/2020 POSITIVE* NONE DETECTED Final  . Amphetamines 02/24/2020 NONE DETECTED  NONE DETECTED Final  . Tetrahydrocannabinol 02/24/2020 POSITIVE* NONE DETECTED Final  . Barbiturates 02/24/2020 NONE DETECTED  NONE DETECTED Final   Comment: (NOTE) DRUG SCREEN FOR MEDICAL PURPOSES ONLY.  IF CONFIRMATION IS NEEDED FOR ANY PURPOSE, NOTIFY LAB WITHIN 5 DAYS.  LOWEST DETECTABLE LIMITS FOR URINE DRUG SCREEN Drug Class  Cutoff (ng/mL) Amphetamine and metabolites    1000 Barbiturate and metabolites    200 Benzodiazepine                 834 Tricyclics and metabolites     300 Opiates and metabolites        300 Cocaine and metabolites        300 THC                            50 Performed at [facility], [place] [address], [place], [place] [address]   . I-stat hCG, quantitative 02/24/2020 <5.0  <5 mIU/mL Final  . Comment 3 02/24/2020          Final   Comment:   GEST. AGE      CONC.  (mIU/mL)   <=1 WEEK        5 - 50     2 WEEKS       50 - 500     3 WEEKS       100 - 10,000     4 WEEKS     1,000 - 30,000        FEMALE AND NON-PREGNANT FEMALE:     LESS THAN 5 mIU/mL     Allergies: Codeine  PTA Medications: (Not in a hospital admission)   Medical Decision Making  Patient reviewed with Dr. Hampton Abbot.  Patient agrees with treatment plan to include overnight observation at behavioral health center and initiate home medications.  Home medications restarted including: -Celexa  40 mg by mouth daily -Neurontin 600 mg  3 times daily -Trileptal 450 mg twice daily  -Seroquel 600 mg nightly -Topamax 150 mg nightly  -Vistaril 25 mg 3 times daily as needed/anxiety    Recommendations  Based on my evaluation the patient does not appear to have an emergency medical condition.   Patient will be placed in continuous assessment area at [facility] for treatment and stabilization.  Patient will be evaluated on 05/15/2020, disposition will be determined at that time.  Emmaline Kluver, FNP 05/14/20  5:52 PM

## 2020-05-14 NOTE — ED Triage Notes (Signed)
Pt comes to [facility] c/o SI and depression. Stated recent med changes aren't working states "I just want to go be with my mom." Pt A&O, cooperative. Tearful during interview.

## 2020-05-15 DIAGNOSIS — F319 Bipolar disorder, unspecified: Secondary | ICD-10-CM | POA: Diagnosis not present

## 2020-05-15 MED ORDER — POTASSIUM CHLORIDE CRYS ER 20 MEQ PO TBCR
40.0000 meq | EXTENDED_RELEASE_TABLET | Freq: Once | ORAL | Status: AC
  Administered 2020-05-15: 40 meq via ORAL
  Filled 2020-05-15: qty 2

## 2020-05-15 MED ORDER — MAGNESIUM OXIDE 400 (241.3 MG) MG PO TABS
400.0000 mg | ORAL_TABLET | Freq: Once | ORAL | Status: DC
  Filled 2020-05-15: qty 1

## 2020-05-15 NOTE — BH Assessment (Signed)
Patient has been accepted to [facility] for inpatient psychiatric treatment.   Patient will be going to [address] Accepting provider is Dr. Nilda Calamity, MD;  Nurse to Nurse report is [phone] Patient can arrive at anytime  Patient is NOT IVC'd, and will sign in to [facility] voluntarily.    Rosemary Holms, RN notified Marvia Pickles, NP notified   Radonna Ricker, MSW, LCSW Clinical Social Worker [place]

## 2020-05-15 NOTE — ED Notes (Signed)
Pt sleeping in no acute distress. Safety maintained.

## 2020-05-15 NOTE — Discharge Instructions (Addendum)
Discharge to [facility].

## 2020-05-15 NOTE — BH Assessment (Signed)
Pt's relative, states she is her Aunt in [place], is calling crisis line multiple times this morning (at least 8 times). Caller states pt needs to get off Seroquel or she is going to burn her own house down. She would like Doxepin considered. Aunt states she is not pt's guardian. Pt observed calling aunt from Obs unit. Caller is yelling, verbally abusive and swearing at staff answering the phone trying to answer permissible questions. Aunt is calling from multiple different phone numbers. Last call this writer asked aunt to stop yelling and swearing and this Probation officer would try to help her. Call was terminated by this writer when aunt screamed "You fu**ing people".

## 2020-05-15 NOTE — ED Notes (Signed)
Patient discharge via Safe Transport to [facility]. Patient aware of admission and voices no concerns. All patient belongings sent with her  And given to transport driver. Report was called to [facility]. Patient escorted off unit by staff to meet driver.

## 2020-05-15 NOTE — ED Notes (Signed)
Patient resting on bed with eyes open. Respirations even and non labored no distress noted. Monitoring continues.

## 2020-05-15 NOTE — Progress Notes (Signed)
Per Marvia Pickles, NP the patient meets criteria for inpatient treatment services. There are no appropriate beds at [facility] at this time. The patient was referred to the following facilities for possible placement:   [facility]   CSW will continue to follow for possible placement.    Radonna Ricker, MSW, LCSW Clinical Social Worker [place]

## 2020-05-15 NOTE — Progress Notes (Signed)
Cristal Ford has been notified of the patient's "low-risk" exposure to COVID-19. According to Cristal Ford, the patient continues to be accepted for placement.   CSW faxed the most recent COVID-19 tests results (negative) to Cristal Ford, as this was requested prior to the patient's transport.   There were no further questions or concerns at this time.    Radonna Ricker, MSW, LCSW Clinical Social Worker [place]

## 2020-05-15 NOTE — ED Provider Notes (Signed)
FBC/OBS ASAP Discharge Summary  Date and Time: 05/15/2020 12:03 PM  Name: [name]  MRN:  [id number]   Discharge Diagnoses:  Final diagnoses:  Bipolar 1 disorder ([place])    Subjective: "I am taking too much medicine. I get confused. I can't seem to want to live. I just think about going to be with my deceased mother and grandmother."   Stay Summary:The patient was seen and examined face to face.  She presents with a flat affect and labile mood. She is alert and oriented x 4. She reported not sleeping well and reported sleeping three to four hours per night. She reported that the Seroquel 600 mg PO at bedtime is making her confused. She reported that she's been doing strange things at home due to feeling confused, such as washing her clothes with soda and was found sleeping on the floor at the store. She reported that she would like to go to an inpatient hospital to be taken off of the Seroquel because she doesn't feel safe going home. She reported living alone and having no support. She denied suicidal ideations, but stated "I don't want to be on earth." She was unable to contract for safety if she returned home today.   When asked if she had attended her follow-up appointment yesterday on 9/16 [facility], she stated "I forgot about the appointment because of the confusion I'm having." When asked if she had discussed having side effects to the Seroquel while inpatient, she stated "the doctor didn't listen to what I had to say." She then began yelling, stating, "I will go home and kill myself then." She refused to discuss any other treatment options.   Per the nursing staff, someone was out front in the lobby waiting to pick up the patient. I spoke with the patient about discharging and she stated that she thought she was discharging. She continued to expressing feelings of wanting to die. She immediately became apologetic and apologized for yelling at the  student NP. We discussed her plan for inpatient treatment and she verbally agreed to inpatient treatment.   Patient recommended for inpatient treatment. Case discussed with Kem Kays., who informed me that the patient is accepted to [facility] reviewed. Vital signs reviewed. Medications reviewed.    Total Time spent with patient: 30 minutes  Past Psychiatric History: Bipolar 1 Past Medical History:  Past Medical History:  Diagnosis Date  . Anxiety   . Arthritis    left knee  . Astigmatism    both eyes  . Cancer ([place])    melanoma removed from back   . Cataract    left eye  . Depression   . Diverticulosis   . Erosive esophagitis   . Family history of ovarian cancer   . Family history of prostate cancer   . Family history of uterine cancer   . 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 GERD (gastroesophageal reflux disease)   . H/O urinary infection   . Headache   . History of hiatal hernia   . IBS (irritable bowel syndrome)    no current med.  . Jaundice   . MRSA (methicillin resistant Staphylococcus aureus)    hospitalized for 48 hours in Dec 2016  . Staph infection    "in my blood"    Past Surgical History:  Procedure Laterality Date  . CHOLECYSTECTOMY  08/02/2012   Procedure: LAPAROSCOPIC CHOLECYSTECTOMY WITH INTRAOPERATIVE CHOLANGIOGRAM;  Surgeon: Rolm Bookbinder, MD;  Location: [place];  Service: General;  Laterality: N/A;  . COLONOSCOPY WITH PROPOFOL  04/09/2014  . COLONOSCOPY WITH PROPOFOL N/A 07/14/2016   Procedure: COLONOSCOPY WITH PROPOFOL;  Surgeon: Milus Banister, MD;  Location: [facility];  Service: Endoscopy;  Laterality: N/A;  . DILATION AND CURETTAGE OF UTERUS     w/ hysteroscopy to remove cyst  . ESOPHAGOGASTRODUODENOSCOPY N/A 12/02/2017   Procedure: ESOPHAGOGASTRODUODENOSCOPY (EGD);  Surgeon: Carol Ada, MD;  Location: [place];  Service: Endoscopy;  Laterality: N/A;  . ESOPHAGOGASTRODUODENOSCOPY  (EGD) WITH PROPOFOL  04/09/2014  . ESOPHAGOGASTRODUODENOSCOPY (EGD) WITH PROPOFOL N/A 07/14/2016   Procedure: ESOPHAGOGASTRODUODENOSCOPY (EGD) WITH PROPOFOL;  Surgeon: Milus Banister, MD;  Location: [facility];  Service: Endoscopy;  Laterality: N/A;  . HYSTEROSCOPY WITH D & C  06/22/2012   Procedure: DILATATION AND CURETTAGE /HYSTEROSCOPY;  Surgeon: Lahoma Crocker, MD;  Location: [facility];  Service: Gynecology;  Laterality: N/A;  . KNEE ARTHROSCOPY  2011   left  . ORIF TOE FRACTURE Left 11/05/2014   Procedure: OPEN REDUCTION INTERNAL FIXATION (ORIF) LEFT FIFTH METATARSAL (TOE) FRACTURE WITH CALCANEAL BONE GRAFT;  Surgeon: Dorna Leitz, MD;  Location: [facility];  Service: Orthopedics;  Laterality: Left;  . WISDOM TOOTH EXTRACTION     Family History:  Family History  Problem Relation Age of Onset  . Heart disease Father   . Stroke Father   . Heart attack Father   . Skin cancer Father   . Diabetes Father   . Uterine cancer Mother 59  . COPD Mother   . Cancer - Colon Maternal Grandfather        mets to bone  . Stomach cancer Paternal Grandmother   . Cervical cancer Sister        dx between 52-33  . Migraines Sister   . Prostate cancer Maternal Uncle 59       mets to bone  . Ovarian cancer Paternal Aunt   . Heart attack Paternal Grandfather   . Skin cancer Paternal Aunt    Family Psychiatric History: Unknown. Social History:  Social History   Substance and Sexual Activity  Alcohol Use No  . Alcohol/week: 0.0 standard drinks     Social History   Substance and Sexual Activity  Drug Use No  . Types: Marijuana   Comment: history of use    Social History   Socioeconomic History  . Marital status: Significant Other    Spouse name: Not on file  . Number of children: 0  . Years of education: College  . Highest education level: Not on file  Occupational History  . Occupation: Disability  Tobacco Use  . Smoking status: Current Every Day Smoker     Packs/day: 1.00    Years: 33.00    Pack years: 33.00    Types: Cigarettes  . Smokeless tobacco: Never Used  . Tobacco comment: form given 02-04-16   Substance and Sexual Activity  . Alcohol use: No    Alcohol/week: 0.0 standard drinks  . Drug use: No    Types: Marijuana    Comment: history of use  . Sexual activity: Yes    Birth control/protection: Post-menopausal  Other Topics Concern  . Not on file  Social History Narrative   Lives at home w/ significant other   Right-handed   Caffeine: coffee "all day"   Social Determinants of Health   Financial Resource Strain:   . Difficulty of Paying Living Expenses: Not on file  Food Insecurity:   . Worried About Charity fundraiser in the Last Year: Not on file  . Ran Out of Food in the Last Year: Not on file  Transportation Needs:   . Lack of Transportation (Medical): Not on file  . Lack of Transportation (Non-Medical): Not on file  Physical Activity:   . Days of Exercise per Week: Not on file  . Minutes of Exercise per Session: Not on file  Stress:   . Feeling of Stress : Not on file  Social Connections:   . Frequency of Communication with Friends and Family: Not on file  . Frequency of Social Gatherings with Friends and Family: Not on file  . Attends Religious Services: Not on file  . Active Member of Clubs or Organizations: Not on file  . Attends Archivist Meetings: Not on file  . Marital Status: Not on file   SDOH:  SDOH Screenings   Alcohol Screen: Low Risk   . Last Alcohol Screening Score (AUDIT): 0  Depression (PHQ2-9): Medium Risk  . PHQ-2 Score: 16  Financial Resource Strain:   . Difficulty of Paying Living Expenses: Not on file  Food Insecurity:   . Worried About Charity fundraiser in the Last Year: Not on file  . Ran Out of Food in the Last Year: Not on file  Housing:   . Last Housing Risk Score: Not on file  Physical Activity:   . Days of Exercise per Week: Not on file  . Minutes of Exercise  per Session: Not on file  Social Connections:   . Frequency of Communication with Friends and Family: Not on file  . Frequency of Social Gatherings with Friends and Family: Not on file  . Attends Religious Services: Not on file  . Active Member of Clubs or Organizations: Not on file  . Attends Archivist Meetings: Not on file  . Marital Status: Not on file  Stress:   . Feeling of Stress : Not on file  Tobacco Use: High Risk  . Smoking Tobacco Use: Current Every Day Smoker  . Smokeless Tobacco Use: Never Used  Transportation Needs:   . Film/video editor (Medical): Not on file  . Lack of Transportation (Non-Medical): Not on file    Has this patient used any form of tobacco in the last 30 days? (Cigarettes, Smokeless Tobacco, Cigars, and/or Pipes) Prescription not provided because: Patient declined  Current Medications:  Current Facility-Administered Medications  Medication Dose Route Frequency Provider Last Rate Last Admin  . acetaminophen (TYLENOL) tablet 650 mg  650 mg Oral Q6H PRN Emmaline Kluver, FNP      . alum & mag hydroxide-simeth (MAALOX/MYLANTA) 200-200-20 MG/5ML suspension 30 mL  30 mL Oral Q4H PRN Emmaline Kluver, FNP      . citalopram (CELEXA) tablet 40 mg  40 mg Oral Daily Emmaline Kluver, FNP   40 mg at 05/15/20 1000  . gabapentin (NEURONTIN) capsule 600 mg  600 mg Oral TID PC & HS Emmaline Kluver, FNP   600 mg at 05/15/20 1884  . hydrOXYzine (ATARAX/VISTARIL) tablet 25 mg  25 mg Oral TID PRN Emmaline Kluver, FNP      . magnesium hydroxide (MILK OF MAGNESIA) suspension 30 mL  30 mL Oral Daily PRN Emmaline Kluver, FNP      . magnesium oxide (MAG-OX) tablet 400 mg  400 mg Oral Once Rozetta Nunnery, NP      . OXcarbazepine (TRILEPTAL)  tablet 450 mg  450 mg Oral BID Emmaline Kluver, FNP   450 mg at 05/15/20 1000  . pantoprazole (PROTONIX) EC tablet 40 mg  40 mg Oral Daily Emmaline Kluver, FNP   40 mg at 05/15/20 1000  . QUEtiapine (SEROQUEL) tablet 600 mg  600 mg Oral QHS [name] L,  FNP      . topiramate (TOPAMAX) tablet 150 mg  150 mg Oral QHS Emmaline Kluver, FNP   150 mg at 05/14/20 2147   Current Outpatient Medications  Medication Sig Dispense Refill  . Adalimumab (HUMIRA [place]) Inject into the skin every 14 (fourteen) days.    . citalopram (CELEXA) 40 MG tablet Take 1 tablet (40 mg total) by mouth daily. For depression 30 tablet 0  . cloNIDine (CATAPRES) 0.1 MG tablet Take 0.1 mg by mouth 2 (two) times daily as needed. High Blood Pressure    . cyclobenzaprine (FLEXERIL) 10 MG tablet Take 10 mg by mouth 3 (three) times daily as needed for muscle spasms.    [name] gabapentin (NEURONTIN) 300 MG capsule Take 2 capsules (600 mg total) by mouth 4 (four) times daily - after meals and at bedtime. For agitation 120 capsule 1  . hydrOXYzine (ATARAX/VISTARIL) 25 MG tablet Take 1 tablet (25 mg total) by mouth every 4 (four) hours as needed for anxiety (Sleep). 75 tablet 0  . ibuprofen (ADVIL,MOTRIN) 800 MG tablet Take 1 tablet (800 mg total) by mouth 3 (three) times daily. (Patient not taking: Reported on 04/09/2020) 21 tablet 0  . nicotine (NICODERM CQ - DOSED IN MG/24 HOURS) 21 mg/24hr patch Place 1 patch (21 mg total) onto the skin daily. (May buy from over the counter): For smoking cessation 1 patch 0  . OXcarbazepine (TRILEPTAL) 150 MG tablet Take 3 tablets (450 mg total) by mouth 2 (two) times daily. For mood stabilization 180 tablet 0  . pantoprazole (PROTONIX) 40 MG tablet Take 1 tablet (40 mg total) by mouth daily. For acid reflux 15 tablet 0  . QUEtiapine (SEROQUEL) 300 MG tablet Take 2 tablets (600 mg total) by mouth at bedtime. For mood control 60 tablet 0  . topiramate (TOPAMAX) 50 MG tablet Take 3 tablets (150 mg total) by mouth at bedtime. For mood stabilization 45 tablet 1    PTA Medications: (Not in a hospital admission)   Musculoskeletal  Strength & Muscle Tone: within normal limits Gait & Station: normal Patient leans: N/A  Psychiatric Specialty Exam  Presentation   General Appearance: Appropriate for Environment  Eye Contact:Fair  Speech:Clear and Coherent;Normal Rate  Speech Volume:Normal  Handedness:Right   Mood and Affect  Mood:Anxious;Irritable;Labile  Affect:Labile   Thought Process  Thought Processes:Coherent  Descriptions of Associations:Intact  Orientation:Full (Time, Place and Person)  Thought Content:WDL  Hallucinations:Hallucinations: Auditory Description of Auditory Hallucinations: "I hear voices, chatters of people talking about me."  Ideas of Reference:None  Suicidal Thoughts:Suicidal Thoughts: Yes, Active ("I don't want to be on earth.") SI Active Intent and/or Plan: Without Intent  Homicidal Thoughts:Homicidal Thoughts: No   Sensorium  Memory:Immediate Fair;Recent Fair;Remote Fair  Judgment:Poor  Insight:Poor   Executive Functions  Concentration:Fair  Attention Span:Fair  [place]   Psychomotor Activity  Psychomotor Activity:Psychomotor Activity: Restlessness   Assets  Assets:Communication Skills;Desire for Improvement;Housing;Leisure Time;Physical Health   Sleep  Sleep:Sleep: Poor   Physical Exam  Physical Exam Vitals and nursing note reviewed.  Constitutional:      General: She is not in acute distress.  Appearance: She is well-developed.  HENT:     Head: Normocephalic and atraumatic.  Eyes:     Conjunctiva/sclera: Conjunctivae normal.  Cardiovascular:     Rate and Rhythm: Normal rate and regular rhythm.     Heart sounds: No murmur heard.   Pulmonary:     Effort: Pulmonary effort is normal. No respiratory distress.     Breath sounds: Normal breath sounds.  Abdominal:     Palpations: Abdomen is soft.     Tenderness: There is no abdominal tenderness.  Musculoskeletal:        General: Normal range of motion.     Cervical back: Neck supple.  Skin:    General: Skin is warm and dry.  Neurological:     Mental Status: She is  alert and oriented to person, place, and time.    Review of Systems  Constitutional: Negative.   HENT: Negative.   Eyes: Negative.   Respiratory: Negative.   Cardiovascular: Negative.   Gastrointestinal: Negative.   Genitourinary: Negative.   Musculoskeletal: Negative.   Skin: Negative.   Neurological: Negative.   Endo/Heme/Allergies: Negative.   Psychiatric/Behavioral: Positive for depression, hallucinations and suicidal ideas.   Blood pressure (!) 123/95, pulse 93, temperature 97.7 F (36.5 C), temperature source Temporal, resp. rate 18, last menstrual period 06/14/2012, SpO2 98 %. There is no height or weight on file to calculate BMI.   Disposition: Accepted to [place], FNP 05/15/2020, 12:03 PM

## 2020-05-16 LAB — PROLACTIN: Prolactin: 4.8 ng/mL (ref 4.8–23.3)

## 2020-10-13 ENCOUNTER — Ambulatory Visit: Payer: Self-pay | Admitting: *Deleted

## 2020-10-13 NOTE — Telephone Encounter (Signed)
Pt evasive historian. Reports dizziness x 2 weeks. Also reports "Stuttering." States fell this weekend due to "Spinning." States has been dropping things and seeing people that are not there." . Also states face, chest and neck "Bright red." No new medications. States she contacted PCP "He said maybe a stroke" Asked what he advised, She states "Not sure, he hasn't been in." Advised ED, states BF will drive.  Reason for Disposition . SEVERE dizziness (vertigo) (e.g., unable to walk without assistance)  Answer Assessment - Initial Assessment Questions 1. DESCRIPTION: "Describe your dizziness."     Spinning 2. VERTIGO: "Do you feel like either you or the room is spinning or tilting?"      Not 3. LIGHTHEADED: "Do you feel lightheaded?" (e.g., somewhat faint, woozy, weak upon standing)     yes 4. SEVERITY: "How bad is it?"  "Can you walk?"   - MILD: Feels unsteady but walking normally.   - MODERATE: Feels very unsteady when walking, but not falling; interferes with normal activities (e.g., school, work) .   - SEVERE: Unable to walk without falling, or requires assistance to walk without falling.     Severe, fell this weekend 5. ONSET:  "When did the dizziness begin?"     2 weeks ago 6. AGGRAVATING FACTORS: "Does anything make it worse?" (e.g., standing, change in head position)     Comes and goes 7. CAUSE: "What do you think is causing the dizziness?"     Unsure 8. RECURRENT SYMPTOM: "Have you had dizziness before?" If Yes, ask: "When was the last time?" "What happened that time?"     no 9. OTHER SYMPTOMS: "Do you have any other symptoms?" (e.g., headache, weakness, numbness, vomiting, earache)     Seeing people that are not there. Talking to people not there, onset 2 weeks ago. Dropping things. Short term memory bad.  Protocols used: DIZZINESS - VERTIGO-A-AH

## 2020-10-14 ENCOUNTER — Emergency Department (HOSPITAL_COMMUNITY)
Admission: EM | Admit: 2020-10-14 | Discharge: 2020-10-15 | Disposition: A | Discharge status: Home / Self Care | Payer: Medicare Other | Attending: Emergency Medicine | Admitting: Emergency Medicine

## 2020-10-14 ENCOUNTER — Emergency Department (HOSPITAL_COMMUNITY): Payer: Medicare Other

## 2020-10-14 DIAGNOSIS — R200 Anesthesia of skin: Secondary | ICD-10-CM | POA: Diagnosis not present

## 2020-10-14 DIAGNOSIS — K5790 Diverticulosis of intestine, part unspecified, without perforation or abscess without bleeding: Secondary | ICD-10-CM | POA: Diagnosis not present

## 2020-10-14 DIAGNOSIS — R42 Dizziness and giddiness: Secondary | ICD-10-CM | POA: Diagnosis not present

## 2020-10-14 DIAGNOSIS — F17210 Nicotine dependence, cigarettes, uncomplicated: Secondary | ICD-10-CM | POA: Diagnosis not present

## 2020-10-14 DIAGNOSIS — H538 Other visual disturbances: Secondary | ICD-10-CM | POA: Diagnosis not present

## 2020-10-14 DIAGNOSIS — Z85828 Personal history of other malignant neoplasm of skin: Secondary | ICD-10-CM | POA: Diagnosis not present

## 2020-10-14 DIAGNOSIS — Z86006 Personal history of melanoma in-situ: Secondary | ICD-10-CM | POA: Diagnosis not present

## 2020-10-14 LAB — COMPREHENSIVE METABOLIC PANEL
ALT: 23 U/L (ref 0–44)
AST: 17 U/L (ref 15–41)
Albumin: 3.7 g/dL (ref 3.5–5.0)
Alkaline Phosphatase: 102 U/L (ref 38–126)
Anion gap: 13 (ref 5–15)
BUN: 6 mg/dL (ref 6–20)
CO2: 20 mmol/L — ABNORMAL LOW (ref 22–32)
Calcium: 8.8 mg/dL — ABNORMAL LOW (ref 8.9–10.3)
Chloride: 101 mmol/L (ref 98–111)
Creatinine, Ser: 0.85 mg/dL (ref 0.44–1.00)
GFR, Estimated: >60 mL/min (ref >60)
Glucose, Bld: 91 mg/dL (ref 70–99)
Potassium: 3.3 mmol/L — ABNORMAL LOW (ref 3.5–5.1)
Sodium: 134 mmol/L — ABNORMAL LOW (ref 135–145)
Total Bilirubin: 0.4 mg/dL (ref 0.3–1.2)
Total Protein: 6.5 g/dL (ref 6.5–8.1)

## 2020-10-14 LAB — CBC
HCT: 41.2 % (ref 36.0–46.0)
Hemoglobin: 12.7 g/dL (ref 12.0–15.0)
MCH: 26.5 pg (ref 26.0–34.0)
MCHC: 30.8 g/dL (ref 30.0–36.0)
MCV: 86 fL (ref 80.0–100.0)
Platelets: 331 10*3/uL (ref 150–400)
RBC: 4.79 MIL/uL (ref 3.87–5.11)
RDW: 14.2 % (ref 11.5–15.5)
WBC: 8.9 10*3/uL (ref 4.0–10.5)
nRBC: 0 % (ref 0.0–0.2)

## 2020-10-14 LAB — I-STAT CHEM 8, ED
BUN: 6 mg/dL (ref 6–20)
Calcium, Ion: 1.1 mmol/L — ABNORMAL LOW (ref 1.15–1.40)
Chloride: 103 mmol/L (ref 98–111)
Creatinine, Ser: 0.8 mg/dL (ref 0.44–1.00)
Glucose, Bld: 102 mg/dL — ABNORMAL HIGH (ref 70–99)
HCT: 42 % (ref 36.0–46.0)
Hemoglobin: 14.3 g/dL (ref 12.0–15.0)
Potassium: 3.9 mmol/L (ref 3.5–5.1)
Sodium: 139 mmol/L (ref 135–145)
TCO2: 25 mmol/L (ref 22–32)

## 2020-10-14 LAB — DIFFERENTIAL
Abs Immature Granulocytes: 0.04 10*3/uL (ref 0.00–0.07)
Basophils Absolute: 0 10*3/uL (ref 0.0–0.1)
Basophils Relative: 0 %
Eosinophils Absolute: 0.1 10*3/uL (ref 0.0–0.5)
Eosinophils Relative: 1 %
Immature Granulocytes: 1 %
Lymphocytes Relative: 20 %
Lymphs Abs: 1.7 10*3/uL (ref 0.7–4.0)
Monocytes Absolute: 0.6 10*3/uL (ref 0.1–1.0)
Monocytes Relative: 6 %
Neutro Abs: 6.4 10*3/uL (ref 1.7–7.7)
Neutrophils Relative %: 72 %

## 2020-10-14 LAB — PROTIME-INR
INR: 1 (ref 0.8–1.2)
Prothrombin Time: 12.5 seconds (ref 11.4–15.2)

## 2020-10-14 LAB — APTT: aPTT: 33 seconds (ref 24–36)

## 2020-10-14 LAB — I-STAT BETA HCG BLOOD, ED (MC, WL, AP ONLY): I-stat hCG, quantitative: <5 m[IU]/mL (ref <5)

## 2020-10-14 MED ORDER — SODIUM CHLORIDE 0.9% FLUSH: 3.0000 mL | Freq: Once | INTRAVENOUS | Status: DC

## 2020-10-14 NOTE — ED Triage Notes (Signed)
Pt arrives [facility] from home for dizziness that began since a syncope events maybe 2 weeks ago. She has ongoing trouble ambulating, stuttering and feeling light headed.

## 2020-10-14 NOTE — ED Provider Notes (Signed)
[facility] DEPART[facility] Provider Note   CSN: [id number] Arrival date & time: 10/14/20  1634     History Chief Complaint  Patient presents with  . Dizziness    [name] is a 50 y.o. female.  [name] is a 50 y.o. female with a history of bipolar disorder, depression, anxiety, GERD, IBS, who presents to the emergency department for evaluation of dizziness. Patient reports that about 2 weeks ago she started experiencing dizziness, primarily when she wakes up in the morning. She reports it feels like the room is moving and like she is off balance and is going to fall over and she did fall once. She reports the symptoms seem to be persistent every morning when she wakes up, eventually seem to resolve, but have been lasting for longer and longer over the past few days. She also reports 6 weeks ago she started to notice some intermittent numbness on the left side of her face and in her left upper and lower extremity, but these seem to resolve a few days ago. She reports that she has had some intermittent blurred vision but that this seems to be improved when she wears her reading glasses. She also states that over the past 6 weeks she has had intermittent brief episodes where she feels like she cannot find the words she wants to say. Reports occasional headaches which she has a prior history of. Has chronic IBS and experiences some intermittent abdominal pain, vomiting and diarrhea, but this is unchanged and not worse than usual. No fevers or chills. No neck pain. She has not tried any medications to treat the symptoms and over the past 6 weeks has not seen anyone for evaluation. Is on multiple psychiatric medications, she states no recent medication changes or dose changes. No other aggravating or alleviating factors.        Past Medical History:  Diagnosis Date  . Anxiety   . Arthritis    left knee  . Astigmatism    both eyes  . Cancer ([place])     melanoma removed from back   . Cataract    left eye  . Depression   . Diverticulosis   . Erosive esophagitis   . Family history of ovarian cancer   . Family history of prostate cancer   . Family history of uterine cancer   . 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 GERD (gastroesophageal reflux disease)   . H/O urinary infection   . Headache   . History of hiatal hernia   . IBS (irritable bowel syndrome)    no current med.  . Jaundice   . MRSA (methicillin resistant Staphylococcus aureus)    hospitalized for 48 hours in Dec 2016  . Staph infection    "in my blood"    Patient Active Problem List   Diagnosis Date Noted  . PTSD (post-traumatic stress disorder) 04/09/2020  . Severe bipolar I disorder, current or most recent episode depressed ([place]) 04/08/2020  . Opiate dependence ([place]) 04/08/2020  . Bipolar 1 disorder ([place]) 04/08/2020  . Inadequate sleep hygiene 04/06/2017  . Snoring 04/06/2017  . Sleep apnea 04/06/2017  . Sleep deprivation 04/06/2017  . Genetic testing 08/24/2016  . Family history of ovarian cancer   . Family history of uterine cancer   . Family history of prostate cancer   . Dysphagia 06/23/2016  . Cellulitis 03/02/2016  . Loss of weight 02/04/2016  .  Diarrhea of presumed infectious origin 02/04/2016  . Generalized abdominal pain 02/04/2016  . Internal hemorrhoid, bleeding 04/24/2014  . Irregular menstrual cycle 06/22/2012  . TOBACCO ABUSE 10/19/2009  . SYNCOPE 10/19/2009  . CHEST PAIN 10/19/2009  . SYNCOPE, HX OF 10/19/2009  . CANDIDIASIS OF THE ESOPHAGUS 06/17/2009  . LEG PAIN, LEFT 12/22/2008  . HAIR LOSS 06/03/2008  . HEMORRHOIDS, INTERNAL, WITH BLEEDING 11/01/2007  . VITAMIN B12 DEFICIENCY 09/05/2007  . DYSPLASTIC NEVUS 08/14/2007  . THROMBOCYTOSIS 07/03/2007  . Diarrhea 07/03/2007  . 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 HYPERCHOLESTEROLEMIA 05/31/2007  . DISORDER, BIPOLAR NEC 05/31/2007  . PSORIASIS  05/31/2007  . INSOMNIA 05/31/2007  . COCAINE ABUSE 05/14/2007  . PERIODONTAL DISEASE 05/14/2007  . Gastritis and gastroduodenitis 05/14/2007  . DIVERTICULOSIS, COLON, HX OF 05/14/2007  . MYALGIA, HX OF 05/14/2007    Past Surgical History:  Procedure Laterality Date  . CHOLECYSTECTOMY  08/02/2012   Procedure: LAPAROSCOPIC CHOLECYSTECTOMY WITH INTRAOPERATIVE CHOLANGIOGRAM;  Surgeon: Rolm Bookbinder, MD;  Location: [facility];  Service: General;  Laterality: N/A;  . COLONOSCOPY WITH PROPOFOL  04/09/2014  . COLONOSCOPY WITH PROPOFOL N/A 07/14/2016   Procedure: COLONOSCOPY WITH PROPOFOL;  Surgeon: Milus Banister, MD;  Location: [facility];  Service: Endoscopy;  Laterality: N/A;  . DILATION AND CURETTAGE OF UTERUS     w/ hysteroscopy to remove cyst  . ESOPHAGOGASTRODUODENOSCOPY N/A 12/02/2017   Procedure: ESOPHAGOGASTRODUODENOSCOPY (EGD);  Surgeon: Carol Ada, MD;  Location: [facility];  Service: Endoscopy;  Laterality: N/A;  . ESOPHAGOGASTRODUODENOSCOPY (EGD) WITH PROPOFOL  04/09/2014  . ESOPHAGOGASTRODUODENOSCOPY (EGD) WITH PROPOFOL N/A 07/14/2016   Procedure: ESOPHAGOGASTRODUODENOSCOPY (EGD) WITH PROPOFOL;  Surgeon: Milus Banister, MD;  Location: [facility];  Service: Endoscopy;  Laterality: N/A;  . HYSTEROSCOPY WITH D & C  06/22/2012   Procedure: DILATATION AND CURETTAGE /HYSTEROSCOPY;  Surgeon: Lahoma Crocker, MD;  Location: [facility];  Service: Gynecology;  Laterality: N/A;  . KNEE ARTHROSCOPY  2011   left  . ORIF TOE FRACTURE Left 11/05/2014   Procedure: OPEN REDUCTION INTERNAL FIXATION (ORIF) LEFT FIFTH METATARSAL (TOE) FRACTURE WITH CALCANEAL BONE GRAFT;  Surgeon: Dorna Leitz, MD;  Location: [facility];  Service: Orthopedics;  Laterality: Left;  . WISDOM TOOTH EXTRACTION       OB History   No obstetric history on file.     Family History  Problem Relation Age of Onset  . Heart disease Father   . Stroke Father   . Heart attack Father   . Skin cancer Father    . Diabetes Father   . Uterine cancer Mother 67  . COPD Mother   . Cancer - Colon Maternal Grandfather        mets to bone  . Stomach cancer Paternal Grandmother   . Cervical cancer Sister        dx between 4-33  . Migraines Sister   . Prostate cancer Maternal Uncle 59       mets to bone  . Ovarian cancer Paternal Aunt   . Heart attack Paternal Grandfather   . Skin cancer Paternal Aunt     Social History   Tobacco Use  . Smoking status: Current Every Day Smoker    Packs/day: 1.00    Years: 33.00    Pack years: 33.00    Types: Cigarettes  . Smokeless tobacco: Never Used  . Tobacco comment: form given 02-04-16   Substance Use Topics  . Alcohol use: No    Alcohol/week: 0.0 standard drinks  .  Drug use: No    Types: Marijuana    Comment: history of use    Home Medications Prior to Admission medications   Medication Sig Start Date End Date Taking? Authorizing Provider  BREO ELLIPTA 100-25 MCG/INH AEPB Inhale 1 puff into the lungs 2 (two) times daily. 08/03/20  Yes [provider]  busPIRone (BUSPAR) 15 MG tablet Take 15 mg by mouth 3 (three) times daily. 09/21/20  Yes [provider]  clonazePAM (KLONOPIN) 1 MG tablet Take 1 mg by mouth 2 (two) times daily. 10/10/20  Yes [provider]  cyclobenzaprine (FLEXERIL) 10 MG tablet Take 10 mg by mouth 3 (three) times daily as needed for muscle spasms. 10/14/20  Yes [provider]  furosemide (LASIX) 40 MG tablet Take 40 mg by mouth daily. 08/03/20  Yes [provider]  gabapentin (NEURONTIN) 600 MG tablet Take 600 mg by mouth 3 (three) times daily. 09/29/20  Yes [provider]  HUMIRA PEN 40 MG/0.4ML PNKT Inject 40 mg into the skin every 14 (fourteen) days. 09/09/20  Yes [provider]  hydrOXYzine (VISTARIL) 50 MG capsule Take 50 mg by mouth daily as needed for anxiety. 09/24/20  Yes [provider]  ibuprofen (ADVIL) 800 MG tablet Take 800 mg by mouth 4 (four)  times daily as needed for headache or mild pain. 06/05/20  Yes [provider]  oxcarbazepine (TRILEPTAL) 600 MG tablet Take 600 mg by mouth 2 (two) times daily. 10/06/20  Yes [provider]  pantoprazole (PROTONIX) 40 MG tablet Take 40 mg by mouth daily. 10/04/20  Yes [provider]  prazosin (MINIPRESS) 1 MG capsule Take 1 mg by mouth at bedtime. 09/25/20  Yes [provider]  promethazine (PHENERGAN) 25 MG tablet Take 25 mg by mouth every 6 (six) hours as needed for nausea or vomiting. 10/12/20  Yes [provider]  QUEtiapine (SEROQUEL) 25 MG tablet Take 25 mg by mouth 2 (two) times daily. 10/05/20  Yes [provider]  QUEtiapine (SEROQUEL) 400 MG tablet Take 400 mg by mouth at bedtime. 10/05/20  Yes [provider]  sertraline (ZOLOFT) 100 MG tablet Take 150 mg by mouth every morning. 09/24/20  Yes [provider]  SUMAtriptan (IMITREX) 100 MG tablet Take 100 mg by mouth every 2 (two) hours as needed for migraine. 06/25/20  Yes [provider]  topiramate (TOPAMAX) 100 MG tablet Take 100 mg by mouth at bedtime. 08/30/20  Yes [provider]  traMADol (ULTRAM) 50 MG tablet Take 50 mg by mouth every 6 (six) hours as needed for moderate pain. 07/06/20  Yes [provider]  zolpidem (AMBIEN) 10 MG tablet Take 10 mg by mouth at bedtime. 09/01/20  Yes [provider]    Allergies    Codeine  Review of Systems   Review of Systems  Constitutional: Negative for chills and fever.  HENT: Negative.   Eyes: Positive for visual disturbance.  Respiratory: Negative for cough and shortness of breath.   Cardiovascular: Negative for chest pain.  Gastrointestinal: Negative for abdominal pain, nausea and vomiting.  Genitourinary: Negative for dysuria.  Musculoskeletal: Negative for arthralgias and myalgias.  Skin: Negative for color change and rash.  Neurological: Positive for dizziness, speech difficulty,  weakness, numbness and headaches. Negative for seizures, syncope, facial asymmetry and light-headedness.  All other systems reviewed and are negative.   Physical Exam Updated Vital Signs BP (!) 147/82   Pulse 98   Temp 98.2 F (36.8 C) (Oral)  Resp (!) 21   LMP 06/14/2012   SpO2 100%   Physical Exam Vitals and nursing note reviewed.  Constitutional:      General: She is not in acute distress.    Appearance: Normal appearance. She is well-developed and well-nourished. She is obese. She is not ill-appearing or diaphoretic.  HENT:     Head: Normocephalic and atraumatic.     Mouth/Throat:     Mouth: Oropharynx is clear and moist. Mucous membranes are moist.     Pharynx: Oropharynx is clear.  Eyes:     General:        Right eye: No discharge.        Left eye: No discharge.     Extraocular Movements: Extraocular movements intact and EOM normal.     Pupils: Pupils are equal, round, and reactive to light.     Comments: No nystagmus  Cardiovascular:     Rate and Rhythm: Normal rate and regular rhythm.     Pulses: Intact distal pulses.     Heart sounds: Normal heart sounds. No murmur heard. No friction rub. No gallop.   Pulmonary:     Effort: Pulmonary effort is normal. No respiratory distress.     Breath sounds: Normal breath sounds. No wheezing or rales.     Comments: Respirations equal and unlabored, patient able to speak in full sentences, lungs clear to auscultation bilaterally  Abdominal:     General: Bowel sounds are normal. There is no distension.     Palpations: Abdomen is soft. There is no mass.     Tenderness: There is no abdominal tenderness. There is no guarding.     Comments: Abdomen soft, nondistended, nontender to palpation in all quadrants without guarding or peritoneal signs   Musculoskeletal:        General: No deformity or edema.     Cervical back: Normal range of motion and neck supple. No rigidity or tenderness.  Skin:    General: Skin is warm and dry.      Capillary Refill: Capillary refill takes less than 2 seconds.  Neurological:     Mental Status: She is alert.     Coordination: Coordination normal.     Comments: Speech is clear, patient did have a brief episode of word finding difficulty during history, but this quickly resolved, able to follow commands CN III-XII intact Normal strength in upper and lower extremities bilaterally including dorsiflexion and plantar flexion, strong and equal grip strength Sensation normal to light and sharp touch Moves extremities without ataxia, coordination intact Normal finger to nose and rapid alternating movements No pronator drift  Psychiatric:        Mood and Affect: Mood is anxious.        Behavior: Behavior normal.     ED Results / Procedures / Treatments   Labs (all labs ordered are listed, but only abnormal results are displayed) Labs Reviewed  COMPREHENSIVE METABOLIC PANEL - Abnormal; Notable for the following components:      Result Value   Sodium 134 (*)    Potassium 3.3 (*)    CO2 20 (*)    Calcium 8.8 (*)    All other components within normal limits  I-STAT CHEM 8, ED - Abnormal; Notable for the following components:   Glucose, Bld 102 (*)    Calcium, Ion 1.10 (*)    All other components within normal limits  PROTIME-INR  APTT  CBC  DIFFERENTIAL  TOPIRAMATE LEVEL  GABAPENTIN LEVEL  MISC [facility]  TEST (SEND OUT)  I-STAT BETA HCG BLOOD, ED ([facility], [facility], [facility] ONLY)  CBG MONITORING, ED    EKG EKG Interpretation  Date/Time:  Wednesday October 14 2020 17:16:17 EST Ventricular Rate:  87 PR Interval:  198 QRS Duration: 76 QT Interval:  394 QTC Calculation: 474 R Axis:   63 Text Interpretation: Normal sinus rhythm Cannot rule out Anterior infarct , age undetermined Abnormal ECG TW changes resolved from last tracing Confirmed by Addison Lank [phone]) on 10/14/2020 11:21:25 PM   Radiology CT HEAD WO CONTRAST  Result Date: 10/14/2020 CLINICAL DATA:  Syncope EXAM: CT HEAD  WITHOUT CONTRAST TECHNIQUE: Contiguous axial images were obtained from the base of the skull through the vertex without intravenous contrast. COMPARISON:  MRI 04/05/2017, CT 10/16/2009 FINDINGS: Brain: No acute territorial infarction, hemorrhage or intracranial mass. The ventricles are nonenlarged. Vascular: No hyperdense vessel or unexpected calcification. Skull: Normal. Negative for fracture or focal lesion. Sinuses/Orbits: No acute finding. Other: None IMPRESSION: Negative non contrasted CT appearance of the brain. Electronically Signed   By: Donavan Foil M.D.   On: 10/14/2020 18:05   MR ANGIO HEAD WO CONTRAST  Result Date: 10/15/2020 CLINICAL DATA:  Vertigo. Dizziness since a syncopal event 2 weeks ago. Difficulty walking. EXAM: MR HEAD WITHOUT CONTRAST MR CIRCLE OF WILLIS WITHOUT CONTRAST MRA OF THE NECK WITHOUT AND WITH CONTRAST TECHNIQUE: Multiplanar, multiecho pulse sequences of the brain, circle of willis and surrounding structures were obtained without intravenous contrast. Angiographic images of the neck were obtained using MRA technique without and with intravenous contrast. CONTRAST:  7.21mL GADAVIST GADOBUTROL 1 MMOL/ML IV SOLN COMPARISON:  None. FINDINGS: MR HEAD FINDINGS Brain: No acute infarct, mass effect or extra-axial collection. No acute or chronic hemorrhage. Normal white matter signal, parenchymal volume and CSF spaces. The midline structures are normal. Vascular: Major flow voids are preserved. Skull and upper cervical spine: Normal calvarium and skull base. Visualized upper cervical spine and soft tissues are normal. Sinuses/Orbits:No paranasal sinus fluid levels or advanced mucosal thickening. No mastoid or middle ear effusion. Normal orbits. MR CIRCLE OF WILLIS FINDINGS POSTERIOR CIRCULATION: --Vertebral arteries: Normal --Inferior cerebellar arteries: Normal. --Basilar artery: Normal. --Superior cerebellar arteries: Normal. --Posterior cerebral arteries: Normal. ANTERIOR CIRCULATION:  --Intracranial internal carotid arteries: Normal. --Anterior cerebral arteries (ACA): Normal. --Middle cerebral arteries (MCA): Normal. ANATOMIC VARIANTS: None MRA NECK FINDINGS Normal carotid and vertebral arteries. IMPRESSION: 1. Normal MRI of the brain. 2. Normal MRA of the head and neck. Electronically Signed   By: Ulyses Jarred M.D.   On: 10/15/2020 00:38   MR Angiogram Neck W or Wo Contrast  Result Date: 10/15/2020 CLINICAL DATA:  Vertigo. Dizziness since a syncopal event 2 weeks ago. Difficulty walking. EXAM: MR HEAD WITHOUT CONTRAST MR CIRCLE OF WILLIS WITHOUT CONTRAST MRA OF THE NECK WITHOUT AND WITH CONTRAST TECHNIQUE: Multiplanar, multiecho pulse sequences of the brain, circle of willis and surrounding structures were obtained without intravenous contrast. Angiographic images of the neck were obtained using MRA technique without and with intravenous contrast. CONTRAST:  7.27mL GADAVIST GADOBUTROL 1 MMOL/ML IV SOLN COMPARISON:  None. FINDINGS: MR HEAD FINDINGS Brain: No acute infarct, mass effect or extra-axial collection. No acute or chronic hemorrhage. Normal white matter signal, parenchymal volume and CSF spaces. The midline structures are normal. Vascular: Major flow voids are preserved. Skull and upper cervical spine: Normal calvarium and skull base. Visualized upper cervical spine and soft tissues are normal. Sinuses/Orbits:No paranasal sinus fluid levels or advanced mucosal thickening. No mastoid or middle ear effusion. Normal  orbits. MR CIRCLE OF WILLIS FINDINGS POSTERIOR CIRCULATION: --Vertebral arteries: Normal --Inferior cerebellar arteries: Normal. --Basilar artery: Normal. --Superior cerebellar arteries: Normal. --Posterior cerebral arteries: Normal. ANTERIOR CIRCULATION: --Intracranial internal carotid arteries: Normal. --Anterior cerebral arteries (ACA): Normal. --Middle cerebral arteries (MCA): Normal. ANATOMIC VARIANTS: None MRA NECK FINDINGS Normal carotid and vertebral arteries.  IMPRESSION: 1. Normal MRI of the brain. 2. Normal MRA of the head and neck. Electronically Signed   By: Ulyses Jarred M.D.   On: 10/15/2020 00:38   MR BRAIN WO CONTRAST  Result Date: 10/15/2020 CLINICAL DATA:  Vertigo. Dizziness since a syncopal event 2 weeks ago. Difficulty walking. EXAM: MR HEAD WITHOUT CONTRAST MR CIRCLE OF WILLIS WITHOUT CONTRAST MRA OF THE NECK WITHOUT AND WITH CONTRAST TECHNIQUE: Multiplanar, multiecho pulse sequences of the brain, circle of willis and surrounding structures were obtained without intravenous contrast. Angiographic images of the neck were obtained using MRA technique without and with intravenous contrast. CONTRAST:  7.34mL GADAVIST GADOBUTROL 1 MMOL/ML IV SOLN COMPARISON:  None. FINDINGS: MR HEAD FINDINGS Brain: No acute infarct, mass effect or extra-axial collection. No acute or chronic hemorrhage. Normal white matter signal, parenchymal volume and CSF spaces. The midline structures are normal. Vascular: Major flow voids are preserved. Skull and upper cervical spine: Normal calvarium and skull base. Visualized upper cervical spine and soft tissues are normal. Sinuses/Orbits:No paranasal sinus fluid levels or advanced mucosal thickening. No mastoid or middle ear effusion. Normal orbits. MR CIRCLE OF WILLIS FINDINGS POSTERIOR CIRCULATION: --Vertebral arteries: Normal --Inferior cerebellar arteries: Normal. --Basilar artery: Normal. --Superior cerebellar arteries: Normal. --Posterior cerebral arteries: Normal. ANTERIOR CIRCULATION: --Intracranial internal carotid arteries: Normal. --Anterior cerebral arteries (ACA): Normal. --Middle cerebral arteries (MCA): Normal. ANATOMIC VARIANTS: None MRA NECK FINDINGS Normal carotid and vertebral arteries. IMPRESSION: 1. Normal MRI of the brain. 2. Normal MRA of the head and neck. Electronically Signed   By: Ulyses Jarred M.D.   On: 10/15/2020 00:38    Procedures Procedures   Medications Ordered in ED Medications  sodium chloride  flush (NS) 0.9 % injection 3 mL (3 mLs Intravenous Not Given 10/14/20 2227)    ED Course  I have reviewed the triage vital signs and the nursing notes.  Pertinent labs & imaging results that were available during my care of the patient were reviewed by me and considered in my medical decision making (see chart for details).    MDM Rules/Calculators/A&P                          50 year old female presents reporting dizziness for the past 2 weeks where she feels off balance or like she is going to fall down and did fall down once. She also reports over the past 6 weeks she has had intermittent numbness on the left side of her face and intermittently in her arms and legs. She also reports having intermittent issues with word finding difficulty and blurred vision although states this is better when she wears her reading glasses. She has not seen anyone regarding the symptoms. No prior history of stroke. Is on numerous psychiatric medications.  Labs obtained from triage which are overall reassuring with no leukocytosis and normal hemoglobin, mild hypokalemia of 3.3 but no other significant electrolyte derangements, normal renal and liver function, normal coags.  I have also reviewed head CT which was done from triage which shows no acute intracranial abnormality.  Patient's neurologic symptoms seem to be intermittent and do not localize to one specific area of  the brain. Currently she has no focal neurologic deficits. Will consult with neurology to discuss imaging and further evaluation.  Case discussed with Dr. Kathrynn Speed with neurology, recommends MRI of the brain as well as MRA head and neck.  Is concerned if imaging is normal that this could be related to the numerous psychiatric medications patient is on.  Recommends checking levels for gabapentin, Topamax and Trileptal.  I have added on these levels to patient's lab work, 2 of them are send outs.  If imaging is normal Dr. Leonel Ramsay  recommends patient following up closely with her psychiatrist that prescribes these medications as they may be contributing to her symptoms.  MRI of the brain as well as MRA of the head and neck without any acute abnormalities, no evidence of stroke, mass, aneurysm.  I discussed reassuring imaging with patient.  She is on numerous psychiatric medications that could be contributing to the symptoms.  We have sent gabapentin, Trileptal and topiramate levels, and I discussed with the patient that she should follow closely with her doctor that prescribes all of her psychiatric medications.  She can also follow-up with the neurologist that she has seen previously.  She has been ambulatory here in the department and at this time I feel she is stable for discharge home.  Discussed return precautions.  Patient expresses understanding and agreement.   Final Clinical Impression(s) / ED Diagnoses Final diagnoses:  Dizziness    Rx / DC Orders ED Discharge Orders    None       Janet Berlin 10/15/20 0209    Fredia Sorrow, MD 10/27/20 [phone]

## 2020-10-14 NOTE — ED Notes (Signed)
Patient transported to MRI 

## 2020-10-14 NOTE — ED Provider Notes (Incomplete)
[facility] EMERGENCY DEPARTMENT Provider Note   CSN: [id number] Arrival date & time: 10/14/20  1634     History Chief Complaint  Patient presents with  . Dizziness    [name] is a 50 y.o. female.  HPI     Past Medical History:  Diagnosis Date  . Anxiety   . Arthritis    left knee  . Astigmatism    both eyes  . Cancer ([place])    melanoma removed from back   . Cataract    left eye  . Depression   . Diverticulosis   . Erosive esophagitis   . Family history of ovarian cancer   . Family history of prostate cancer   . Family history of uterine cancer   . 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 GERD (gastroesophageal reflux disease)   . H/O urinary infection   . Headache   . History of hiatal hernia   . IBS (irritable bowel syndrome)    no current med.  . Jaundice   . MRSA (methicillin resistant Staphylococcus aureus)    hospitalized for 48 hours in Dec 2016  . Staph infection    "in my blood"    Patient Active Problem List   Diagnosis Date Noted  . PTSD (post-traumatic stress disorder) 04/09/2020  . Severe bipolar I disorder, current or most recent episode depressed ([place]) 04/08/2020  . Opiate dependence ([place]) 04/08/2020  . Bipolar 1 disorder ([place]) 04/08/2020  . Inadequate sleep hygiene 04/06/2017  . Snoring 04/06/2017  . Sleep apnea 04/06/2017  . Sleep deprivation 04/06/2017  . Genetic testing 08/24/2016  . Family history of ovarian cancer   . Family history of uterine cancer   . Family history of prostate cancer   . Dysphagia 06/23/2016  . Cellulitis 03/02/2016  . Loss of weight 02/04/2016  . Diarrhea of presumed infectious origin 02/04/2016  . Generalized abdominal pain 02/04/2016  . Internal hemorrhoid, bleeding 04/24/2014  . Irregular menstrual cycle 06/22/2012  . TOBACCO ABUSE 10/19/2009  . SYNCOPE 10/19/2009  . CHEST PAIN 10/19/2009  . SYNCOPE, HX OF 10/19/2009  . CANDIDIASIS OF  THE ESOPHAGUS 06/17/2009  . LEG PAIN, LEFT 12/22/2008  . HAIR LOSS 06/03/2008  . HEMORRHOIDS, INTERNAL, WITH BLEEDING 11/01/2007  . VITAMIN B12 DEFICIENCY 09/05/2007  . DYSPLASTIC NEVUS 08/14/2007  . THROMBOCYTOSIS 07/03/2007  . Diarrhea 07/03/2007  . 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 HYPERCHOLESTEROLEMIA 05/31/2007  . DISORDER, BIPOLAR NEC 05/31/2007  . PSORIASIS 05/31/2007  . INSOMNIA 05/31/2007  . COCAINE ABUSE 05/14/2007  . PERIODONTAL DISEASE 05/14/2007  . Gastritis and gastroduodenitis 05/14/2007  . DIVERTICULOSIS, COLON, HX OF 05/14/2007  . MYALGIA, HX OF 05/14/2007    Past Surgical History:  Procedure Laterality Date  . CHOLECYSTECTOMY  08/02/2012   Procedure: LAPAROSCOPIC CHOLECYSTECTOMY WITH INTRAOPERATIVE CHOLANGIOGRAM;  Surgeon: Rolm Bookbinder, MD;  Location: [facility];  Service: General;  Laterality: N/A;  . COLONOSCOPY WITH PROPOFOL  04/09/2014  . COLONOSCOPY WITH PROPOFOL N/A 07/14/2016   Procedure: COLONOSCOPY WITH PROPOFOL;  Surgeon: Milus Banister, MD;  Location: [facility];  Service: Endoscopy;  Laterality: N/A;  . DILATION AND CURETTAGE OF UTERUS     w/ hysteroscopy to remove cyst  . ESOPHAGOGASTRODUODENOSCOPY N/A 12/02/2017   Procedure: ESOPHAGOGASTRODUODENOSCOPY (EGD);  Surgeon: Carol Ada, MD;  Location: [facility];  Service: Endoscopy;  Laterality: N/A;  . ESOPHAGOGASTRODUODENOSCOPY (EGD) WITH PROPOFOL  04/09/2014  . ESOPHAGOGASTRODUODENOSCOPY (EGD) WITH PROPOFOL N/A 07/14/2016  Procedure: ESOPHAGOGASTRODUODENOSCOPY (EGD) WITH PROPOFOL;  Surgeon: Milus Banister, MD;  Location: [facility];  Service: Endoscopy;  Laterality: N/A;  . HYSTEROSCOPY WITH D & C  06/22/2012   Procedure: DILATATION AND CURETTAGE /HYSTEROSCOPY;  Surgeon: Lahoma Crocker, MD;  Location: [facility];  Service: Gynecology;  Laterality: N/A;  . KNEE ARTHROSCOPY  2011   left  . ORIF TOE FRACTURE Left 11/05/2014   Procedure: OPEN REDUCTION INTERNAL FIXATION (ORIF) LEFT FIFTH  METATARSAL (TOE) FRACTURE WITH CALCANEAL BONE GRAFT;  Surgeon: Dorna Leitz, MD;  Location: [facility];  Service: Orthopedics;  Laterality: Left;  . WISDOM TOOTH EXTRACTION       OB History   No obstetric history on file.     Family History  Problem Relation Age of Onset  . Heart disease Father   . Stroke Father   . Heart attack Father   . Skin cancer Father   . Diabetes Father   . Uterine cancer Mother 64  . COPD Mother   . Cancer - Colon Maternal Grandfather        mets to bone  . Stomach cancer Paternal Grandmother   . Cervical cancer Sister        dx between 108-33  . Migraines Sister   . Prostate cancer Maternal Uncle 59       mets to bone  . Ovarian cancer Paternal Aunt   . Heart attack Paternal Grandfather   . Skin cancer Paternal Aunt     Social History   Tobacco Use  . Smoking status: Current Every Day Smoker    Packs/day: 1.00    Years: 33.00    Pack years: 33.00    Types: Cigarettes  . Smokeless tobacco: Never Used  . Tobacco comment: form given 02-04-16   Substance Use Topics  . Alcohol use: No    Alcohol/week: 0.0 standard drinks  . Drug use: No    Types: Marijuana    Comment: history of use    Home Medications Prior to Admission medications   Medication Sig Start Date End Date Taking? Authorizing Provider  BREO ELLIPTA 100-25 MCG/INH AEPB Inhale 1 puff into the lungs 2 (two) times daily. 08/03/20  Yes [provider]  busPIRone (BUSPAR) 15 MG tablet Take 15 mg by mouth 3 (three) times daily. 09/21/20  Yes [provider]  clonazePAM (KLONOPIN) 1 MG tablet Take 1 mg by mouth 2 (two) times daily. 10/10/20  Yes [provider]  cyclobenzaprine (FLEXERIL) 10 MG tablet Take 10 mg by mouth 3 (three) times daily as needed for muscle spasms. 10/14/20  Yes [provider]  furosemide (LASIX) 40 MG tablet Take 40 mg by mouth daily. 08/03/20  Yes [provider]  gabapentin (NEURONTIN) 600 MG tablet Take  600 mg by mouth 3 (three) times daily. 09/29/20  Yes [provider]  HUMIRA PEN 40 MG/0.4ML PNKT Inject 40 mg into the skin every 14 (fourteen) days. 09/09/20  Yes [provider]  hydrOXYzine (VISTARIL) 50 MG capsule Take 50 mg by mouth daily as needed for anxiety. 09/24/20  Yes [provider]  ibuprofen (ADVIL) 800 MG tablet Take 800 mg by mouth 4 (four) times daily as needed for headache or mild pain. 06/05/20  Yes [provider]  oxcarbazepine (TRILEPTAL) 600 MG tablet Take 600 mg by mouth 2 (two) times daily. 10/06/20  Yes [provider]  pantoprazole (PROTONIX) 40 MG tablet Take 40 mg by mouth daily. 10/04/20  Yes [provider]  prazosin (MINIPRESS) 1 MG capsule Take 1 mg by mouth at bedtime. 09/25/20  Yes [provider]  promethazine (PHENERGAN) 25 MG tablet Take 25 mg by mouth every 6 (six) hours as needed for nausea or vomiting. 10/12/20  Yes [provider]  QUEtiapine (SEROQUEL) 25 MG tablet Take 25 mg by mouth 2 (two) times daily. 10/05/20  Yes [provider]  QUEtiapine (SEROQUEL) 400 MG tablet Take 400 mg by mouth at bedtime. 10/05/20  Yes [provider]  sertraline (ZOLOFT) 100 MG tablet Take 150 mg by mouth every morning. 09/24/20  Yes [provider]  SUMAtriptan (IMITREX) 100 MG tablet Take 100 mg by mouth every 2 (two) hours as needed for migraine. 06/25/20  Yes [provider]  topiramate (TOPAMAX) 100 MG tablet Take 100 mg by mouth at bedtime. 08/30/20  Yes [provider]  traMADol (ULTRAM) 50 MG tablet Take 50 mg by mouth every 6 (six) hours as needed for moderate pain. 07/06/20  Yes [provider]  zolpidem (AMBIEN) 10 MG tablet Take 10 mg by mouth at bedtime. 09/01/20  Yes [provider]    Allergies    Codeine  Review of Systems   Review of Systems  Physical Exam Updated Vital Signs BP (!) 147/82   Pulse 98   Temp 98.2 F (36.8 C) (Oral)    Resp (!) 21   LMP 06/14/2012   SpO2 100%   Physical Exam  ED Results / Procedures / Treatments   Labs (all labs ordered are listed, but only abnormal results are displayed) Labs Reviewed  COMPREHENSIVE METABOLIC PANEL - Abnormal; Notable for the following components:      Result Value   Sodium 134 (*)    Potassium 3.3 (*)    CO2 20 (*)    Calcium 8.8 (*)    All other components within normal limits  I-STAT CHEM 8, ED - Abnormal; Notable for the following components:   Glucose, Bld 102 (*)    Calcium, Ion 1.10 (*)    All other components within normal limits  PROTIME-INR  APTT  CBC  DIFFERENTIAL  TOPIRAMATE LEVEL  GABAPENTIN LEVEL  MISC [facility] TEST (SEND OUT)  I-STAT BETA HCG BLOOD, ED ([facility], [facility], [facility] ONLY)  CBG MONITORING, ED    EKG EKG Interpretation  Date/Time:  Wednesday October 14 2020 17:16:17 EST Ventricular Rate:  87 PR Interval:  198 QRS Duration: 76 QT Interval:  394 QTC Calculation: 474 R Axis:   63 Text Interpretation: Normal sinus rhythm Cannot rule out Anterior infarct , age undetermined Abnormal ECG TW changes resolved from last tracing Confirmed by Addison Lank [phone]) on 10/14/2020 11:21:25 PM   Radiology CT HEAD WO CONTRAST  Result Date: 10/14/2020 CLINICAL DATA:  Syncope EXAM: CT HEAD WITHOUT CONTRAST TECHNIQUE: Contiguous axial images were obtained from the base of the skull through the vertex without intravenous contrast. COMPARISON:  MRI 04/05/2017, CT 10/16/2009 FINDINGS: Brain: No acute territorial infarction, hemorrhage or intracranial mass. The ventricles are nonenlarged. Vascular: No hyperdense vessel or unexpected calcification. Skull: Normal. Negative for fracture or focal lesion. Sinuses/Orbits: No acute finding. Other: None IMPRESSION: Negative non contrasted CT appearance of the brain. Electronically Signed   By: Donavan Foil M.D.   On: 10/14/2020 18:05    Procedures Procedures {Remember to document critical care time when  appropriate:1}  Medications Ordered in ED Medications  sodium chloride flush (NS) 0.9 % injection 3 mL (3 mLs Intravenous Not Given 10/14/20 2227)  ED Course  I have reviewed the triage vital signs and the nursing notes.  Pertinent labs & imaging results that were available during my care of the patient were reviewed by me and considered in my medical decision making (see chart for details).    MDM Rules/Calculators/A&P                          Case discussed with Dr. Kathrynn Speed with neurology, recommends MRI of the brain as well as MRA head and neck.  Is concerned if imaging is normal that this could be related to the numerous psychiatric medications patient is on.  Recommends checking levels for gabapentin, Topamax and Trileptal.  I have added on these levels to patient's lab work, 2 of them are send outs.  If imaging is normal Dr. Leonel Ramsay recommends patient following up closely with her psychiatrist that prescribes these medications as they may be contributing to her symptoms.   Final Clinical Impression(s) / ED Diagnoses Final diagnoses:  None    Rx / DC Orders ED Discharge Orders    None

## 2020-10-15 DIAGNOSIS — R42 Dizziness and giddiness: Secondary | ICD-10-CM | POA: Diagnosis not present

## 2020-10-15 MED ORDER — GADOBUTROL 1 MMOL/ML IV SOLN
7.5000 mL | Freq: Once | INTRAVENOUS | Status: AC | PRN
  Administered 2020-10-15: 7.5 mL via INTRAVENOUS

## 2020-10-15 NOTE — Discharge Instructions (Addendum)
Your lab work and imaging today have been reassuring.  We do not see signs of any stroke, mass or vascular abnormalities.  You are on several different psychiatric medications which can have neurologic effects as well, I have sent levels for some of these medications to help your doctor adjust as needed.  Please call to schedule urgent follow-up with your doctor to discuss the symptoms you have been having in your medications.  You can also follow-up with your neurologist that you have seen previously.  Return for any new or worsening symptoms.

## 2020-10-16 LAB — TOPIRAMATE LEVEL: Topiramate Lvl: 3.2 ug/mL (ref 2.0–25.0)

## 2020-10-16 LAB — GABAPENTIN LEVEL: Gabapentin Lvl: 10.1 ug/mL (ref 4.0–16.0)

## 2020-10-19 LAB — MISC LABCORP TEST (SEND OUT): Labcorp test code: 716928

## 2020-12-15 ENCOUNTER — Emergency Department (HOSPITAL_COMMUNITY)
Admission: EM | Admit: 2020-12-15 | Discharge: 2020-12-15 | Disposition: A | Discharge status: Home / Self Care | Payer: Medicare Other | Attending: Emergency Medicine | Admitting: Emergency Medicine

## 2020-12-15 ENCOUNTER — Other Ambulatory Visit: Payer: Self-pay

## 2020-12-15 ENCOUNTER — Emergency Department (HOSPITAL_COMMUNITY): Payer: Medicare Other

## 2020-12-15 DIAGNOSIS — T50901A Poisoning by unspecified drugs, medicaments and biological substances, accidental (unintentional), initial encounter: Secondary | ICD-10-CM | POA: Insufficient documentation

## 2020-12-15 DIAGNOSIS — S299XXA Unspecified injury of thorax, initial encounter: Secondary | ICD-10-CM | POA: Diagnosis present

## 2020-12-15 DIAGNOSIS — F17210 Nicotine dependence, cigarettes, uncomplicated: Secondary | ICD-10-CM | POA: Diagnosis not present

## 2020-12-15 DIAGNOSIS — S20212A Contusion of left front wall of thorax, initial encounter: Secondary | ICD-10-CM | POA: Insufficient documentation

## 2020-12-15 DIAGNOSIS — R600 Localized edema: Secondary | ICD-10-CM | POA: Diagnosis not present

## 2020-12-15 LAB — RAPID URINE DRUG SCREEN, HOSP PERFORMED
Amphetamines: NOT DETECTED
Barbiturates: NOT DETECTED
Benzodiazepines: NOT DETECTED
Cocaine: NOT DETECTED
Opiates: NOT DETECTED
Tetrahydrocannabinol: POSITIVE — AB

## 2020-12-15 LAB — CBC WITH DIFFERENTIAL/PLATELET
Abs Immature Granulocytes: 0.04 10*3/uL (ref 0.00–0.07)
Basophils Absolute: 0 10*3/uL (ref 0.0–0.1)
Basophils Relative: 0 %
Eosinophils Absolute: 0.1 10*3/uL (ref 0.0–0.5)
Eosinophils Relative: 1 %
HCT: 41.3 % (ref 36.0–46.0)
Hemoglobin: 13.1 g/dL (ref 12.0–15.0)
Immature Granulocytes: 0 %
Lymphocytes Relative: 9 %
Lymphs Abs: 1.1 10*3/uL (ref 0.7–4.0)
MCH: 28.1 pg (ref 26.0–34.0)
MCHC: 31.7 g/dL (ref 30.0–36.0)
MCV: 88.6 fL (ref 80.0–100.0)
Monocytes Absolute: 0.6 10*3/uL (ref 0.1–1.0)
Monocytes Relative: 6 %
Neutro Abs: 9.5 10*3/uL — ABNORMAL HIGH (ref 1.7–7.7)
Neutrophils Relative %: 84 %
Platelets: 314 10*3/uL (ref 150–400)
RBC: 4.66 MIL/uL (ref 3.87–5.11)
RDW: 18 % — ABNORMAL HIGH (ref 11.5–15.5)
WBC: 11.3 10*3/uL — ABNORMAL HIGH (ref 4.0–10.5)
nRBC: 0 % (ref 0.0–0.2)

## 2020-12-15 LAB — BASIC METABOLIC PANEL
Anion gap: 7 (ref 5–15)
BUN: 10 mg/dL (ref 6–20)
CO2: 21 mmol/L — ABNORMAL LOW (ref 22–32)
Calcium: 8.7 mg/dL — ABNORMAL LOW (ref 8.9–10.3)
Chloride: 113 mmol/L — ABNORMAL HIGH (ref 98–111)
Creatinine, Ser: 1.1 mg/dL — ABNORMAL HIGH (ref 0.44–1.00)
GFR, Estimated: >60 mL/min (ref >60)
Glucose, Bld: 112 mg/dL — ABNORMAL HIGH (ref 70–99)
Potassium: 4 mmol/L (ref 3.5–5.1)
Sodium: 141 mmol/L (ref 135–145)

## 2020-12-15 LAB — ETHANOL: Alcohol, Ethyl (B): <10 mg/dL (ref <10)

## 2020-12-15 LAB — ACETAMINOPHEN LEVEL: Acetaminophen (Tylenol), Serum: <10 ug/mL — ABNORMAL LOW (ref 10–30)

## 2020-12-15 LAB — SALICYLATE LEVEL: Salicylate Lvl: <7 mg/dL — ABNORMAL LOW (ref 7.0–30.0)

## 2020-12-15 MED ORDER — ONDANSETRON HCL 4 MG/2ML IJ SOLN
4.0000 mg | Freq: Once | INTRAMUSCULAR | Status: AC
  Administered 2020-12-15: 4 mg via INTRAVENOUS
  Filled 2020-12-15: qty 2

## 2020-12-15 NOTE — ED Notes (Signed)
Pt placed on purewick 

## 2020-12-15 NOTE — Discharge Instructions (Signed)
You were seen today with concern for drug overdose.  Your work-up is reassuring and you have been clinically stable.  I have low suspicion that you actually overdosed on any of your medications.  You do have what looks like in a remote rib fracture and some contusion of your chest wall.  You tell me that you feel safe at home; however, if it anytime you feel unsafe at home, you should call 911.
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
## 2020-12-15 NOTE — ED Notes (Signed)
Pt transported to xray 
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
## 2021-02-18 ENCOUNTER — Other Ambulatory Visit: Payer: Self-pay | Admitting: Internal Medicine

## 2021-02-18 DIAGNOSIS — Z1231 Encounter for screening mammogram for malignant neoplasm of breast: Secondary | ICD-10-CM

## 2021-03-28 ENCOUNTER — Ambulatory Visit (HOSPITAL_COMMUNITY)
Admission: EM | Admit: 2021-03-28 | Discharge: 2021-03-28 | Disposition: A | Discharge status: Other Acute Inpatient Hospital | Payer: Medicare Other | Attending: Nurse Practitioner | Admitting: Nurse Practitioner

## 2021-03-28 ENCOUNTER — Encounter (HOSPITAL_COMMUNITY): Payer: Self-pay | Admitting: Emergency Medicine

## 2021-03-28 ENCOUNTER — Inpatient Hospital Stay (HOSPITAL_COMMUNITY)
Admission: AD | Admit: 2021-03-28 | Discharge: 2021-04-05 | DRG: 885 | Disposition: A | Discharge status: Home / Self Care | Payer: Medicare Other | Source: Other Acute Inpatient Hospital | Attending: Psychiatry | Admitting: Psychiatry

## 2021-03-28 ENCOUNTER — Other Ambulatory Visit: Payer: Self-pay

## 2021-03-28 ENCOUNTER — Encounter (HOSPITAL_COMMUNITY): Payer: Self-pay | Admitting: Psychiatry

## 2021-03-28 DIAGNOSIS — F4310 Post-traumatic stress disorder, unspecified: Secondary | ICD-10-CM

## 2021-03-28 DIAGNOSIS — Z8049 Family history of malignant neoplasm of other genital organs: Secondary | ICD-10-CM | POA: Diagnosis not present

## 2021-03-28 DIAGNOSIS — Z8249 Family history of ischemic heart disease and other diseases of the circulatory system: Secondary | ICD-10-CM

## 2021-03-28 DIAGNOSIS — Z79899 Other long term (current) drug therapy: Secondary | ICD-10-CM | POA: Insufficient documentation

## 2021-03-28 DIAGNOSIS — Z8041 Family history of malignant neoplasm of ovary: Secondary | ICD-10-CM

## 2021-03-28 DIAGNOSIS — Z833 Family history of diabetes mellitus: Secondary | ICD-10-CM

## 2021-03-28 DIAGNOSIS — K219 Gastro-esophageal reflux disease without esophagitis: Secondary | ICD-10-CM | POA: Diagnosis present

## 2021-03-28 DIAGNOSIS — Z85820 Personal history of malignant melanoma of skin: Secondary | ICD-10-CM

## 2021-03-28 DIAGNOSIS — G4700 Insomnia, unspecified: Secondary | ICD-10-CM | POA: Diagnosis present

## 2021-03-28 DIAGNOSIS — R45851 Suicidal ideations: Secondary | ICD-10-CM

## 2021-03-28 DIAGNOSIS — Z20822 Contact with and (suspected) exposure to covid-19: Secondary | ICD-10-CM | POA: Diagnosis present

## 2021-03-28 DIAGNOSIS — F319 Bipolar disorder, unspecified: Secondary | ICD-10-CM | POA: Diagnosis present

## 2021-03-28 DIAGNOSIS — F17210 Nicotine dependence, cigarettes, uncomplicated: Secondary | ICD-10-CM | POA: Diagnosis not present

## 2021-03-28 DIAGNOSIS — F315 Bipolar disorder, current episode depressed, severe, with psychotic features: Secondary | ICD-10-CM | POA: Diagnosis present

## 2021-03-28 DIAGNOSIS — Z818 Family history of other mental and behavioral disorders: Secondary | ICD-10-CM | POA: Diagnosis not present

## 2021-03-28 DIAGNOSIS — Z8042 Family history of malignant neoplasm of prostate: Secondary | ICD-10-CM | POA: Diagnosis not present

## 2021-03-28 DIAGNOSIS — Z808 Family history of malignant neoplasm of other organs or systems: Secondary | ICD-10-CM | POA: Diagnosis not present

## 2021-03-28 DIAGNOSIS — Z823 Family history of stroke: Secondary | ICD-10-CM

## 2021-03-28 DIAGNOSIS — F32A Depression, unspecified: Secondary | ICD-10-CM

## 2021-03-28 DIAGNOSIS — F1210 Cannabis abuse, uncomplicated: Secondary | ICD-10-CM | POA: Diagnosis present

## 2021-03-28 DIAGNOSIS — F314 Bipolar disorder, current episode depressed, severe, without psychotic features: Secondary | ICD-10-CM | POA: Diagnosis present

## 2021-03-28 DIAGNOSIS — Z800 Family history of malignant neoplasm of digestive organs: Secondary | ICD-10-CM

## 2021-03-28 DIAGNOSIS — Z825 Family history of asthma and other chronic lower respiratory diseases: Secondary | ICD-10-CM | POA: Diagnosis not present

## 2021-03-28 DIAGNOSIS — F411 Generalized anxiety disorder: Secondary | ICD-10-CM

## 2021-03-28 LAB — POCT URINE DRUG SCREEN - MANUAL ENTRY (I-SCREEN)
POC Amphetamine UR: NOT DETECTED
POC Buprenorphine (BUP): NOT DETECTED
POC Cocaine UR: NOT DETECTED
POC Marijuana UR: POSITIVE — AB
POC Methadone UR: NOT DETECTED
POC Methamphetamine UR: NOT DETECTED
POC Morphine: NOT DETECTED
POC Oxazepam (BZO): POSITIVE — AB
POC Oxycodone UR: NOT DETECTED
POC Secobarbital (BAR): NOT DETECTED

## 2021-03-28 LAB — COMPREHENSIVE METABOLIC PANEL
ALT: 47 U/L — ABNORMAL HIGH (ref 0–44)
AST: 24 U/L (ref 15–41)
Albumin: 4 g/dL (ref 3.5–5.0)
Alkaline Phosphatase: 89 U/L (ref 38–126)
Anion gap: 8 (ref 5–15)
BUN: 10 mg/dL (ref 6–20)
CO2: 26 mmol/L (ref 22–32)
Calcium: 9.4 mg/dL (ref 8.9–10.3)
Chloride: 106 mmol/L (ref 98–111)
Creatinine, Ser: 1.18 mg/dL — ABNORMAL HIGH (ref 0.44–1.00)
GFR, Estimated: 57 mL/min — ABNORMAL LOW (ref >60)
Glucose, Bld: 74 mg/dL (ref 70–99)
Potassium: 3.1 mmol/L — ABNORMAL LOW (ref 3.5–5.1)
Sodium: 140 mmol/L (ref 135–145)
Total Bilirubin: 0.3 mg/dL (ref 0.3–1.2)
Total Protein: 6.9 g/dL (ref 6.5–8.1)

## 2021-03-28 LAB — CBC WITH DIFFERENTIAL/PLATELET
Abs Immature Granulocytes: 0.04 10*3/uL (ref 0.00–0.07)
Basophils Absolute: 0.1 10*3/uL (ref 0.0–0.1)
Basophils Relative: 1 %
Eosinophils Absolute: 0.1 10*3/uL (ref 0.0–0.5)
Eosinophils Relative: 2 %
HCT: 45.2 % (ref 36.0–46.0)
Hemoglobin: 14.4 g/dL (ref 12.0–15.0)
Immature Granulocytes: 1 %
Lymphocytes Relative: 44 %
Lymphs Abs: 3.2 10*3/uL (ref 0.7–4.0)
MCH: 30.2 pg (ref 26.0–34.0)
MCHC: 31.9 g/dL (ref 30.0–36.0)
MCV: 94.8 fL (ref 80.0–100.0)
Monocytes Absolute: 0.5 10*3/uL (ref 0.1–1.0)
Monocytes Relative: 7 %
Neutro Abs: 3.4 10*3/uL (ref 1.7–7.7)
Neutrophils Relative %: 45 %
Platelets: 260 10*3/uL (ref 150–400)
RBC: 4.77 MIL/uL (ref 3.87–5.11)
RDW: 13.9 % (ref 11.5–15.5)
WBC: 7.3 10*3/uL (ref 4.0–10.5)
nRBC: 0 % (ref 0.0–0.2)

## 2021-03-28 LAB — LIPID PANEL
Cholesterol: 192 mg/dL (ref 0–200)
HDL: 49 mg/dL (ref >40)
LDL Cholesterol: 122 mg/dL — ABNORMAL HIGH (ref 0–99)
Total CHOL/HDL Ratio: 3.9 RATIO
Triglycerides: 105 mg/dL (ref <150)
VLDL: 21 mg/dL (ref 0–40)

## 2021-03-28 LAB — HEMOGLOBIN A1C
Hgb A1c MFr Bld: 5.9 % — ABNORMAL HIGH (ref 4.8–5.6)
Mean Plasma Glucose: 122.63 mg/dL

## 2021-03-28 LAB — URINALYSIS, ROUTINE W REFLEX MICROSCOPIC
Bilirubin Urine: NEGATIVE
Glucose, UA: NEGATIVE mg/dL
Hgb urine dipstick: NEGATIVE
Ketones, ur: NEGATIVE mg/dL
Leukocytes,Ua: NEGATIVE
Nitrite: NEGATIVE
Protein, ur: NEGATIVE mg/dL
Specific Gravity, Urine: 1.004 — ABNORMAL LOW (ref 1.005–1.030)
pH: 6 (ref 5.0–8.0)

## 2021-03-28 LAB — TSH: TSH: 3.062 u[IU]/mL (ref 0.350–4.500)

## 2021-03-28 LAB — POC SARS CORONAVIRUS 2 AG: SARSCOV2ONAVIRUS 2 AG: NEGATIVE

## 2021-03-28 LAB — CK: Total CK: 79 U/L (ref 38–234)

## 2021-03-28 LAB — RESP PANEL BY RT-PCR (FLU A&B, COVID) ARPGX2
Influenza A by PCR: NEGATIVE
Influenza B by PCR: NEGATIVE
SARS Coronavirus 2 by RT PCR: NEGATIVE

## 2021-03-28 LAB — POC SARS CORONAVIRUS 2 AG -  ED: SARS Coronavirus 2 Ag: NEGATIVE

## 2021-03-28 LAB — PREGNANCY, URINE: Preg Test, Ur: NEGATIVE

## 2021-03-28 LAB — POCT PREGNANCY, URINE: Preg Test, Ur: NEGATIVE

## 2021-03-28 MED ORDER — CLONAZEPAM 1 MG PO TABS
1.0000 mg | ORAL_TABLET | Freq: Two times a day (BID) | ORAL | Status: DC
  Administered 2021-03-28: 1 mg via ORAL
  Filled 2021-03-28 (×2): qty 1

## 2021-03-28 MED ORDER — MAGNESIUM HYDROXIDE 400 MG/5ML PO SUSP: 30.0000 mL | Freq: Every day | ORAL | Status: DC | PRN

## 2021-03-28 MED ORDER — ACETAMINOPHEN 325 MG PO TABS: 650.0000 mg | ORAL_TABLET | Freq: Four times a day (QID) | ORAL | Status: DC | PRN

## 2021-03-28 MED ORDER — QUETIAPINE FUMARATE 300 MG PO TABS: 300.0000 mg | ORAL_TABLET | Freq: Every day | ORAL | Status: DC

## 2021-03-28 MED ORDER — OXCARBAZEPINE 300 MG PO TABS
600.0000 mg | ORAL_TABLET | Freq: Two times a day (BID) | ORAL | Status: DC
  Administered 2021-03-28: 600 mg via ORAL
  Filled 2021-03-28: qty 2

## 2021-03-28 MED ORDER — GABAPENTIN 600 MG PO TABS
600.0000 mg | ORAL_TABLET | Freq: Three times a day (TID) | ORAL | Status: DC
  Administered 2021-03-28: 600 mg via ORAL
  Filled 2021-03-28: qty 1

## 2021-03-28 MED ORDER — TOPIRAMATE 100 MG PO TABS
100.0000 mg | ORAL_TABLET | Freq: Every day | ORAL | Status: DC
  Administered 2021-03-28: 100 mg via ORAL
  Filled 2021-03-28 (×3): qty 1

## 2021-03-28 MED ORDER — HYDROXYZINE HCL 25 MG PO TABS
25.0000 mg | ORAL_TABLET | Freq: Three times a day (TID) | ORAL | Status: DC | PRN
  Administered 2021-03-29 – 2021-04-04 (×11): 25 mg via ORAL
  Filled 2021-03-28 (×11): qty 1

## 2021-03-28 MED ORDER — MAGNESIUM HYDROXIDE 400 MG/5ML PO SUSP
30.0000 mL | Freq: Every day | ORAL | Status: DC | PRN
  Administered 2021-03-29: 30 mL via ORAL
  Filled 2021-03-28: qty 30

## 2021-03-28 MED ORDER — CLONAZEPAM 1 MG PO TABS
1.0000 mg | ORAL_TABLET | Freq: Two times a day (BID) | ORAL | Status: DC
  Administered 2021-03-28: 1 mg via ORAL
  Filled 2021-03-28: qty 2

## 2021-03-28 MED ORDER — POTASSIUM CHLORIDE CRYS ER 20 MEQ PO TBCR
20.0000 meq | EXTENDED_RELEASE_TABLET | Freq: Two times a day (BID) | ORAL | Status: DC
  Administered 2021-03-28: 20 meq via ORAL
  Filled 2021-03-28: qty 1

## 2021-03-28 MED ORDER — NICOTINE 21 MG/24HR TD PT24: 21.0000 mg | MEDICATED_PATCH | TRANSDERMAL | 0 refills | Status: DC

## 2021-03-28 MED ORDER — TRAZODONE HCL 50 MG PO TABS
50.0000 mg | ORAL_TABLET | Freq: Every evening | ORAL | Status: DC | PRN
  Administered 2021-03-28 – 2021-03-31 (×3): 50 mg via ORAL
  Filled 2021-03-28 (×2): qty 1

## 2021-03-28 MED ORDER — MELATONIN 3 MG PO TABS
3.0000 mg | ORAL_TABLET | Freq: Every day | ORAL | Status: DC
  Filled 2021-03-28 (×4): qty 1

## 2021-03-28 MED ORDER — CLONAZEPAM 1 MG PO TABS
1.0000 mg | ORAL_TABLET | Freq: Every day | ORAL | Status: DC | PRN
  Administered 2021-03-29 – 2021-04-05 (×8): 1 mg via ORAL
  Filled 2021-03-28 (×8): qty 1

## 2021-03-28 MED ORDER — SUMATRIPTAN SUCCINATE 50 MG PO TABS: 100.0000 mg | ORAL_TABLET | Freq: Two times a day (BID) | ORAL | Status: DC | PRN

## 2021-03-28 MED ORDER — ACETAMINOPHEN 325 MG PO TABS
650.0000 mg | ORAL_TABLET | Freq: Four times a day (QID) | ORAL | Status: DC | PRN
  Administered 2021-03-28 – 2021-04-01 (×5): 650 mg via ORAL
  Filled 2021-03-28 (×6): qty 2

## 2021-03-28 MED ORDER — GABAPENTIN 300 MG PO CAPS
600.0000 mg | ORAL_CAPSULE | Freq: Three times a day (TID) | ORAL | Status: DC
  Administered 2021-03-29 – 2021-04-05 (×22): 600 mg via ORAL
  Filled 2021-03-28 (×29): qty 2

## 2021-03-28 MED ORDER — ALUM & MAG HYDROXIDE-SIMETH 200-200-20 MG/5ML PO SUSP
30.0000 mL | ORAL | Status: DC | PRN
  Administered 2021-03-31 – 2021-04-03 (×2): 30 mL via ORAL
  Filled 2021-03-28 (×2): qty 30

## 2021-03-28 MED ORDER — HYDROXYZINE HCL 25 MG PO TABS: 25.0000 mg | ORAL_TABLET | Freq: Three times a day (TID) | ORAL | Status: DC | PRN

## 2021-03-28 MED ORDER — QUETIAPINE FUMARATE 25 MG PO TABS
25.0000 mg | ORAL_TABLET | Freq: Two times a day (BID) | ORAL | Status: DC
  Administered 2021-03-28: 25 mg via ORAL
  Filled 2021-03-28 (×2): qty 1

## 2021-03-28 MED ORDER — PRAZOSIN HCL 1 MG PO CAPS: 1.0000 mg | ORAL_CAPSULE | Freq: Every day | ORAL | Status: DC

## 2021-03-28 MED ORDER — PRAZOSIN HCL 1 MG PO CAPS
1.0000 mg | ORAL_CAPSULE | Freq: Every day | ORAL | Status: DC
  Administered 2021-03-28: 1 mg via ORAL
  Filled 2021-03-28 (×4): qty 1

## 2021-03-28 MED ORDER — QUETIAPINE FUMARATE 200 MG PO TABS: 400.0000 mg | ORAL_TABLET | Freq: Every day | ORAL | Status: DC

## 2021-03-28 MED ORDER — QUETIAPINE FUMARATE 50 MG PO TABS
150.0000 mg | ORAL_TABLET | Freq: Every day | ORAL | Status: DC
  Administered 2021-03-28 – 2021-03-29 (×2): 150 mg via ORAL
  Filled 2021-03-28 (×5): qty 3

## 2021-03-28 MED ORDER — TOPIRAMATE 100 MG PO TABS: 100.0000 mg | ORAL_TABLET | Freq: Every day | ORAL | Status: DC

## 2021-03-28 MED ORDER — ALUM & MAG HYDROXIDE-SIMETH 200-200-20 MG/5ML PO SUSP: 30.0000 mL | ORAL | Status: DC | PRN

## 2021-03-28 MED ORDER — QUETIAPINE FUMARATE 25 MG PO TABS
25.0000 mg | ORAL_TABLET | Freq: Two times a day (BID) | ORAL | Status: DC
  Administered 2021-03-29 – 2021-04-05 (×15): 25 mg via ORAL
  Filled 2021-03-28 (×20): qty 1

## 2021-03-28 MED ORDER — CLONAZEPAM 1 MG PO TABS: 1.0000 mg | ORAL_TABLET | Freq: Every day | ORAL | Status: DC | PRN

## 2021-03-28 MED ORDER — PANTOPRAZOLE SODIUM 40 MG PO TBEC
40.0000 mg | DELAYED_RELEASE_TABLET | Freq: Every day | ORAL | Status: DC
  Administered 2021-03-28: 40 mg via ORAL
  Filled 2021-03-28: qty 1
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
## 2021-03-28 NOTE — Progress Notes (Signed)
[facility] Group Notes:  (Nursing/MHT/Case Management/Adjunct)  Date:  03/28/2021  Time:  2000  Type of Therapy:   wrap up group  Participation Level:  Active  Participation Quality:  Appropriate, Attentive, Sharing, and Supportive  Affect:  Appropriate  Cognitive:  Alert  Insight:  Improving  Engagement in Group:  Engaged  Modes of Intervention:  Clarification, Education, and Support  Summary of Progress/Problems: Positive thinking and positive change were discussed.   Winfield Rast S 03/28/2021, 9:02 PM

## 2021-03-28 NOTE — Progress Notes (Signed)
[name], female   DOB: 02-15-1971, 50 y.o.   MRN: [id number] Patient has been accepted to 405-1 pending nurse-to-nurse report. Call report to [phone]. Fax voluntary paperwork to [phone]. Patient may be transported after report is called. Comer Locket., RN/AC

## 2021-03-28 NOTE — Discharge Instructions (Addendum)
Discharge to [facility], Room 405-1, Dr. Mallie Darting, attending.

## 2021-03-28 NOTE — ED Notes (Signed)
Report given to [place], [place]

## 2021-03-28 NOTE — ED Notes (Signed)
Pt A&O x 4, brought in by [facility] Dept, presents with suicidal ideation, stating she doesn't want to be here anymore.  Not taking any prescribed meds at present.  Pt very sleepy during assessment.  Denies HI, admits to [place].  Monitoring for safety, no distress noted, calm & cooperative.

## 2021-03-28 NOTE — ED Notes (Signed)
Per MD, to hold off on giving Seroquel for now d/t prolonged qtc on EKG.

## 2021-03-28 NOTE — ED Notes (Signed)
Report called to Ivin Booty, RN at [facility].  MD over to visit with patient.  Patient stated that she doesn't want to go to [place].  Stated that all of her family is in [place].  Stated that she feels like she's losing her mind and she wants to be some place comfortable, familiar.  Stated the thought of going to [place] makes her sick.
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
## 2021-03-28 NOTE — Progress Notes (Signed)
Per Hessie Diener Bobbitt,NP, patient meets criteria for inpatient treatment. There are no available or appropriate beds at [facility] today. CSW faxed referrals to the following facilities for review:  [facility]   TTS will continue to seek bed placement.  Glennie Isle, MSW, [place], LCAS-A Phone: [phone] Disposition/TOC

## 2021-03-28 NOTE — ED Notes (Signed)
Patient appears to be sleeping.  RR even and unlabored.  Continue to monitor for safety.

## 2021-03-28 NOTE — Plan of Care (Signed)
Medication ordered for patient based on PTA med list, chart review, and discussion with patient: -Klonopin 1 mg daily PRN (patient reports taking approximately 4 Klonopin 1 mg tablets per week PRN, regular scripts filled by one prescriber per PDMP) -Gabapentin 600 mg TID (on PTA med list and patient reports taking regularly) -Prazosin 1 mg QHS for nightmares -Seroquel 25 mg BID (patient reports adherence) -Seroquel 150 mg QHS (patient reports taking her home Seroquel 300 mg every other night) -Topomax 100 mg QHS (patient reports taking every night for migraine PPX) -Imitrex 100 mg BID PRN for migraines -Melatonin 3 mg for sleep  Not restarting currently: Ambien 10 mg QHS Protonix 40 mg daily Advil 800 mg four times daily Trileptal 600 mg (patient reports non-compliance) Phenergan 25 mg PRN Humira q14d Flexeril 10 mg TID She reports an unknown BP medicine (denies having multiple BP meds)

## 2021-03-28 NOTE — ED Notes (Signed)
Patient sleeping.  RR even and unlabored.  Continue to monitor for safety.

## 2021-03-28 NOTE — Tx Team (Signed)
Initial Treatment Plan 03/28/2021 8:51 PM [name] [id number]    PATIENT STRESSORS: Other: living situation, mental health   PATIENT STRENGTHS: Average or above average intelligence Capable of independent living Communication skills Motivation for treatment/growth   PATIENT IDENTIFIED PROBLEMS: Suicidal ideation    "I don't see a way out"     "I need to get my meds right"             DISCHARGE CRITERIA:  Adequate post-discharge living arrangements Improved stabilization in mood, thinking, and/or behavior Reduction of life-threatening or endangering symptoms to within safe limits Verbal commitment to aftercare and medication compliance  PRELIMINARY DISCHARGE PLAN: Outpatient therapy Return to previous living arrangement  PATIENT/FAMILY INVOLVEMENT: This treatment plan has been presented to and reviewed with the patient, [name], .  The patient has been given the opportunity to ask questions and make suggestions.  Waymond Cera, RN 03/28/2021, 8:51 PM

## 2021-03-28 NOTE — Progress Notes (Signed)
Peter Congo with [facility] contacted CSW in reference to reviewing this patient for possible placement. It was reported that this patient will be presented to their provider.  Glennie Isle, MSW, [place], LCAS-A Phone: [phone] Disposition/TOC
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
## 2021-03-28 NOTE — Progress Notes (Signed)
Per Peter Congo, pt has been accepted to [facility] Adult unit. Accepting provider is Dr. Franchot Mimes. Patient can arrive anytime. Number for report is [phone].   Glennie Isle, MSW, LCSW-A Phone: [phone] Disposition/TOC

## 2021-03-28 NOTE — Progress Notes (Signed)
   03/28/21 2045  Psych Admission Type (Psych Patients Only)  Admission Status Voluntary  Psychosocial Assessment  Patient Complaints Hopelessness;Worrying  Eye Contact Fair  Facial Expression Anxious;Worried  Affect Anxious;Appropriate to circumstance  Speech Logical/coherent  Interaction Assertive  Motor Activity Other (Comment) (WNL)  Appearance/Hygiene Unremarkable  Behavior Characteristics Cooperative  Mood Anxious  Thought Process  Coherency WDL  Content WDL  Delusions None reported or observed  Perception WDL  Hallucination None reported or observed  Judgment Impaired  Confusion None  Danger to Self  Current suicidal ideation? Denies  Self-Injurious Behavior No self-injurious ideation or behavior indicators observed or expressed   Agreement Not to Harm Self Yes  Description of Agreement Verbal agreement  Danger to Others  Danger to Others None reported or observed

## 2021-03-28 NOTE — ED Notes (Signed)
Per Dr. Earley Favor, may give AM Seroquel now.  PM dose adjusted per MD.

## 2021-03-28 NOTE — ED Notes (Signed)
Patient talking with MD.  Stated that she still wishes she was here on earth, but denies SI, HI.  Endorses hearing voices and a loud hum.  Denies VH.

## 2021-03-28 NOTE — ED Notes (Signed)
All belongings returned to patient from patient locker and belongings sheet signed.  Patient ambulated independently to [facility] port without issue.  Discharged in stable condition; no acute distress.  Safe Transport to transport patient to [facility].

## 2021-03-29 DIAGNOSIS — F32A Depression, unspecified: Secondary | ICD-10-CM

## 2021-03-29 LAB — HEPATIC FUNCTION PANEL
ALT: 41 U/L (ref 0–44)
AST: 21 U/L (ref 15–41)
Albumin: 4 g/dL (ref 3.5–5.0)
Alkaline Phosphatase: 97 U/L (ref 38–126)
Bilirubin, Direct: 0.1 mg/dL (ref 0.0–0.2)
Indirect Bilirubin: 0.1 mg/dL — ABNORMAL LOW (ref 0.3–0.9)
Total Bilirubin: 0.2 mg/dL — ABNORMAL LOW (ref 0.3–1.2)
Total Protein: 6.9 g/dL (ref 6.5–8.1)

## 2021-03-29 LAB — AMMONIA: Ammonia: 32 umol/L (ref 9–35)

## 2021-03-29 MED ORDER — NICOTINE 21 MG/24HR TD PT24
21.0000 mg | MEDICATED_PATCH | Freq: Every day | TRANSDERMAL | Status: DC
  Administered 2021-03-29 – 2021-04-05 (×8): 21 mg via TRANSDERMAL
  Filled 2021-03-29 (×11): qty 1

## 2021-03-29 MED ORDER — PRAZOSIN HCL 2 MG PO CAPS
2.0000 mg | ORAL_CAPSULE | Freq: Every day | ORAL | Status: DC
  Administered 2021-03-29 – 2021-04-04 (×7): 2 mg via ORAL
  Filled 2021-03-29 (×9): qty 1
  Filled 2021-03-29: qty 2

## 2021-03-29 MED ORDER — NICOTINE 21 MG/24HR TD PT24: 21.0000 mg | MEDICATED_PATCH | Freq: Every day | TRANSDERMAL | Status: DC

## 2021-03-29 NOTE — H&P (Signed)
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 ([place]) Long Term Goal(s): Improvement in symptoms so as ready for discharge  Short Term Goals: Compliance with prescribed medications will improve  Physician Treatment Plan for Secondary Diagnosis: Principal Problem:   Bipolar 1 disorder ([place]) Active Problems:   Suicidal ideation   Depression  Long Term Goal(s): Improvement in symptoms so as ready for discharge  Short Term Goals: Ability to disclose and discuss suicidal ideas   Safety and Monitoring -- VOLUNTARY admission to  inpatient psychiatric unit for safety, stabilization and treatment -- Daily contact with patient to assess and evaluate symptoms and progress in treatment -- Patient's case to be discussed in multi-disciplinary team meeting -- Observation Level : q15 minute checks -- Vital signs:  q12 hours -- Precautions: suicide  Depression and anxiety  with 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 -Supportive psychotherapy -Continue Seroquel 25 mg BID and 150 mg QHS (restarted 7/31) Antipsychotic labs  EKG: sinus rhythm with 1st degree AV block (PR 217), pt is asymptomatic, Qtc 437  Lipids: LDL 122, otherwise WNL  A1C: 5.9 AIMS: not yet assessed -Continue Klonopin 1 mg daily PRN for anxiety -Work with LCSW regarding disposition  Cannabis Use Disorder -Abstinence encouraged -Motivational Interviewing   Tobacco use disorder -as above -NRT  PTSD with nightmares -Increase Prazosin to 2 mg QHS given continued nightmares Monitor BP and orthostasis  Migraine Headaches Topomax 100 gm D/C'd given ineffectiveness Continue Imitrex 100 mg BID PRN  Chronic pain -continue home gabapentin 600 mg TID  Continue PRN's: Tylenol, Maalox, Atarax, Milk of Magnesia, Trazodone  Medical Management Covid negative CMP: AST of 47, otherwise, unremarkable -Check ammonia and repeat HFP CBC: unremarkable EtOH: not collected UDS: MJ, BNZ Upreg: neg TSH: 3  I certify that inpatient services furnished can reasonably be expected to improve the patient's condition.    Corky Sox PGY-1, Psychiatry
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
## 2021-03-29 NOTE — Progress Notes (Signed)
Recreation Therapy Notes  Date:  8.1.22 Time: 0930 Location: [address] Dayroom  Group Topic: Stress Management  Goal Area(s) Addresses:  Patient will identify positive stress management techniques. Patient will identify benefits of using stress management post d/c.  Intervention: Stress Management  Activity :  Meditation.  LRT played a meditation that focused on challenging negative thoughts.  Patients were to listen and follow along as meditation was played to fully engaged in activity.  Education:  Stress Management, Discharge Planning.   Education Outcome: Acknowledges Education  Clinical Observations/Feedback: Pt did not attend group session.    Victorino Sparrow, LRT/CTRS         Victorino Sparrow A 03/29/2021 11:25 AM

## 2021-03-29 NOTE — Progress Notes (Signed)
Nurse discussed anxiety and coping skills with patient. 

## 2021-03-29 NOTE — Tx Team (Signed)
Interdisciplinary Treatment and Diagnostic Plan Update  03/29/2021 Time of Session: 9:35am [name] MRN: [id number]  Principal Diagnosis: Bipolar 1 disorder ([place])  Secondary Diagnoses: Principal Problem:   Bipolar 1 disorder ([place]) Active Problems:   Suicidal ideation   Depression   Current Medications:  Current Facility-Administered Medications  Medication Dose Route Frequency Provider Last Rate Last Admin   acetaminophen (TYLENOL) tablet 650 mg  650 mg Oral Q6H PRN Rosezetta Schlatter, MD   650 mg at 03/29/21 0540   alum & mag hydroxide-simeth (MAALOX/MYLANTA) 200-200-20 MG/5ML suspension 30 mL  30 mL Oral Q4H PRN Rosezetta Schlatter, MD       clonazePAM (KLONOPIN) tablet 1 mg  1 mg Oral Daily PRN Corky Sox, MD   1 mg at 03/29/21 5638   gabapentin (NEURONTIN) capsule 600 mg  600 mg Oral TID Corky Sox, MD   600 mg at 03/29/21 1300   hydrOXYzine (ATARAX/VISTARIL) tablet 25 mg  25 mg Oral TID PRN Rosezetta Schlatter, MD       magnesium hydroxide (MILK OF MAGNESIA) suspension 30 mL  30 mL Oral Daily PRN Rosezetta Schlatter, MD       melatonin tablet 3 mg  3 mg Oral QHS Corky Sox, MD       prazosin (MINIPRESS) capsule 1 mg  1 mg Oral QHS Corky Sox, MD   1 mg at 03/28/21 2053   QUEtiapine (SEROQUEL) tablet 150 mg  150 mg Oral QHS Corky Sox, MD   150 mg at 03/28/21 2052   QUEtiapine (SEROQUEL) tablet 25 mg  25 mg Oral BID Corky Sox, MD   25 mg at 03/29/21 0803   SUMAtriptan (IMITREX) tablet 100 mg  100 mg Oral BID PRN Corky Sox, MD       traZODone (DESYREL) tablet 50 mg  50 mg Oral QHS PRN Rosezetta Schlatter, MD   50 mg at 03/28/21 2053   PTA Medications: Medications Prior to Admission  Medication Sig Dispense Refill Last Dose   clonazePAM (KLONOPIN) 1 MG tablet Take 1 mg by mouth 2 (two) times daily.      cyclobenzaprine (FLEXERIL) 10 MG tablet Take 10 mg by mouth 3 (three) times daily as needed for muscle spasms.      gabapentin (NEURONTIN) 600 MG tablet  Take 600 mg by mouth 3 (three) times daily.      HUMIRA PEN 40 MG/0.4ML PNKT Inject 40 mg into the skin every 14 (fourteen) days.      hydrOXYzine (VISTARIL) 50 MG capsule Take 50 mg by mouth daily as needed for anxiety.      ibuprofen (ADVIL) 800 MG tablet Take 800 mg by mouth 4 (four) times daily as needed for headache or mild pain.      nicotine (NICODERM CQ - DOSED IN MG/24 HOURS) 21 mg/24hr patch Place 1 patch (21 mg total) onto the skin daily for 1 day. 1 patch 0    oxcarbazepine (TRILEPTAL) 600 MG tablet Take 600 mg by mouth 2 (two) times daily.      pantoprazole (PROTONIX) 40 MG tablet Take 40 mg by mouth daily.      prazosin (MINIPRESS) 1 MG capsule Take 1 mg by mouth at bedtime.      promethazine (PHENERGAN) 25 MG tablet Take 25 mg by mouth every 6 (six) hours as needed for nausea or vomiting.      QUEtiapine (SEROQUEL) 25 MG tablet Take 25 mg by mouth 2 (two) times daily.      QUEtiapine (SEROQUEL) 300  MG tablet Take 1 tablet (300 mg total) by mouth at bedtime.      SUMAtriptan (IMITREX) 100 MG tablet Take 100 mg by mouth every 2 (two) hours as needed for migraine.      topiramate (TOPAMAX) 100 MG tablet Take 100 mg by mouth at bedtime.      zolpidem (AMBIEN) 10 MG tablet Take 10 mg by mouth at bedtime.       Patient Stressors: Other: living situation, mental health  Patient Strengths: Average or above average intelligence Capable of independent living Communication skills Motivation for treatment/growth  Treatment Modalities: Medication Management, Group therapy, Case management,  1 to 1 session with clinician, Psychoeducation, Recreational therapy.   Physician Treatment Plan for Primary Diagnosis: Bipolar 1 disorder ([place]) Long Term Goal(s):     Short Term Goals:    Medication Management: Evaluate patient's response, side effects, and tolerance of medication regimen.  Therapeutic Interventions: 1 to 1 sessions, Unit Group sessions and Medication  administration.  Evaluation of Outcomes: Not Met  Physician Treatment Plan for Secondary Diagnosis: Principal Problem:   Bipolar 1 disorder ([place]) Active Problems:   Suicidal ideation   Depression  Long Term Goal(s):     Short Term Goals:       Medication Management: Evaluate patient's response, side effects, and tolerance of medication regimen.  Therapeutic Interventions: 1 to 1 sessions, Unit Group sessions and Medication administration.  Evaluation of Outcomes: Not Met   RN Treatment Plan for Primary Diagnosis: Bipolar 1 disorder ([place]) Long Term Goal(s): Knowledge of disease and therapeutic regimen to maintain health will improve  Short Term Goals: Ability to remain free from injury will improve, Ability to verbalize frustration and anger appropriately will improve, Ability to participate in decision making will improve, Ability to verbalize feelings will improve, Ability to identify and develop effective coping behaviors will improve, and Compliance with prescribed medications will improve  Medication Management: RN will administer medications as ordered by provider, will assess and evaluate patient's response and provide education to patient for prescribed medication. RN will report any adverse and/or side effects to prescribing provider.  Therapeutic Interventions: 1 on 1 counseling sessions, Psychoeducation, Medication administration, Evaluate responses to treatment, Monitor vital signs and CBGs as ordered, Perform/monitor CIWA, COWS, AIMS and Fall Risk screenings as ordered, Perform wound care treatments as ordered.  Evaluation of Outcomes: Not Met   LCSW Treatment Plan for Primary Diagnosis: Bipolar 1 disorder ([place]) Long Term Goal(s): Safe transition to appropriate next level of care at discharge, Engage patient in therapeutic group addressing interpersonal concerns.  Short Term Goals: Engage patient in aftercare planning with referrals and resources, Increase social  support, Increase ability to appropriately verbalize feelings, Increase emotional regulation, and Increase skills for wellness and recovery  Therapeutic Interventions: Assess for all discharge needs, 1 to 1 time with Social worker, Explore available resources and support systems, Assess for adequacy in community support network, Educate family and significant other(s) on suicide prevention, Complete Psychosocial Assessment, Interpersonal group therapy.  Evaluation of Outcomes: Not Met   Progress in Treatment: Attending groups: Yes. and No. Participating in groups: No. Taking medication as prescribed: Yes. Toleration medication: Yes. Family/Significant other contact made: Yes, individual(s) contacted:  aunt Patient understands diagnosis: Yes. Discussing patient identified problems/goals with staff: Yes. Medical problems stabilized or resolved: Yes. Denies suicidal/homicidal ideation: Yes. Issues/concerns per patient self-inventory: No.  New problem(s) identified: No, Describe:  none  New Short Term/Long Term Goal(s): detox, medication management for mood stabilization; elimination of SI thoughts;  development of comprehensive mental wellness/sobriety plan  Patient Goals:  "To get medicine under control"  Discharge Plan or Barriers: Patient recently admitted. CSW will continue to follow and assess for appropriate referrals and possible discharge planning.    Reason for Continuation of Hospitalization: Anxiety Depression Medication stabilization Suicidal ideation  Estimated Length of Stay: 3-5 days  Attendees: Patient: [name] 03/29/2021   Physician: Fatima Sanger, DO 03/29/2021   Nursing:  03/29/2021  RN Care Manager: 03/29/2021   Social Worker: Darletta Moll, LCSW 03/29/2021   Recreational Therapist:  03/29/2021   Other:  03/29/2021   Other:  03/29/2021   Other: 03/29/2021    Scribe for Treatment Team: Vassie Moselle, LCSW 03/29/2021 2:24 PM

## 2021-03-29 NOTE — Progress Notes (Signed)
D:  Patient denied SI and HI, contracts for safety.  Denied A/V hallucinations.  Denied pain. A:  Medications administered per MD orders.  Emotional support and encouragement given patient. R:  Safety maintained with 15 minute checks.  

## 2021-03-29 NOTE — [facility] Group Notes (Signed)
Spiritual care group on grief and loss facilitated by chaplain Janne Napoleon, [facility]   Group Goal:   Support / Education around grief and loss   Members engage in facilitated group support and psycho-social education.   Group Description:   Following introductions and group rules, group members engaged in facilitated group dialog and support around topic of loss, with particular support around experiences of loss in their lives. Group Identified types of loss (relationships / self / things) and identified patterns, circumstances, and changes that precipitate losses. Reflected on thoughts / feelings around loss, normalized grief responses, and recognized variety in grief experience. Group noted Worden's four tasks of grief in discussion.   Group drew on Adlerian / Rogerian, narrative, MI,   Patient Progress:   [name] attended the first part of group and engaged in the conversation.  At times she was dominating and had to be redirected.  [place], Bcc Pager, [phone] 3:11 PM
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
## 2021-03-29 NOTE — BHH Group Notes (Signed)
Therapy Type: Group Therapy  Participation Level:  Minimal   Patients received a worksheet with an outline of 2 gingerbread men with a separation in the middle of the page. One sign designated what the pt sees about themselves and the other is what others see. Pts were asked to introduce themselves and share something they like about themself. Pts were then asked to draw, write or color how they view themselves as well as how they are viewed by others. CSW led discussion about the feelings and words associated with each side.   Patient Summary:   The patient did not share an introduction due to coming to group late.  Pt was appropriate and participated in group discussion.
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
## 2021-03-29 NOTE — Progress Notes (Signed)
Adult Psychoeducational Group Note  Date:  03/29/2021 Time:  5:02 PM  Group Topic/Focus:  Orientation:   The focus of this group is to educate the patient on the purpose and policies of crisis stabilization and provide a format to answer questions about their admission.  The group details unit policies and expectations of patients while admitted.  Participation Level:  Active  Participation Quality:  Appropriate  Affect:  Appropriate  Cognitive:  Appropriate  Insight: Appropriate  Engagement in Group:  Engaged  Modes of Intervention:  Discussion  Additional Comments: Patient attended morning orientation/goal setting group and participated.    Grasiela Jonsson W Demetra Moya [id number], 5:02 PM

## 2021-03-30 DIAGNOSIS — F1210 Cannabis abuse, uncomplicated: Secondary | ICD-10-CM | POA: Diagnosis present

## 2021-03-30 DIAGNOSIS — F315 Bipolar disorder, current episode depressed, severe, with psychotic features: Principal | ICD-10-CM

## 2021-03-30 LAB — CBC WITH DIFFERENTIAL/PLATELET
Abs Immature Granulocytes: 0.03 10*3/uL (ref 0.00–0.07)
Basophils Absolute: 0 10*3/uL (ref 0.0–0.1)
Basophils Relative: 1 %
Eosinophils Absolute: 0.1 10*3/uL (ref 0.0–0.5)
Eosinophils Relative: 2 %
HCT: 44 % (ref 36.0–46.0)
Hemoglobin: 14 g/dL (ref 12.0–15.0)
Immature Granulocytes: 1 %
Lymphocytes Relative: 29 %
Lymphs Abs: 1.9 10*3/uL (ref 0.7–4.0)
MCH: 30.5 pg (ref 26.0–34.0)
MCHC: 31.8 g/dL (ref 30.0–36.0)
MCV: 95.9 fL (ref 80.0–100.0)
Monocytes Absolute: 0.5 10*3/uL (ref 0.1–1.0)
Monocytes Relative: 7 %
Neutro Abs: 3.9 10*3/uL (ref 1.7–7.7)
Neutrophils Relative %: 60 %
Platelets: 240 10*3/uL (ref 150–400)
RBC: 4.59 MIL/uL (ref 3.87–5.11)
RDW: 13.9 % (ref 11.5–15.5)
WBC: 6.4 10*3/uL (ref 4.0–10.5)
nRBC: 0 % (ref 0.0–0.2)

## 2021-03-30 LAB — BASIC METABOLIC PANEL
Anion gap: 6 (ref 5–15)
BUN: 12 mg/dL (ref 6–20)
CO2: 28 mmol/L (ref 22–32)
Calcium: 9.2 mg/dL (ref 8.9–10.3)
Chloride: 105 mmol/L (ref 98–111)
Creatinine, Ser: 1.08 mg/dL — ABNORMAL HIGH (ref 0.44–1.00)
GFR, Estimated: >60 mL/min (ref >60)
Glucose, Bld: 112 mg/dL — ABNORMAL HIGH (ref 70–99)
Potassium: 4.5 mmol/L (ref 3.5–5.1)
Sodium: 139 mmol/L (ref 135–145)

## 2021-03-30 MED ORDER — METHOCARBAMOL 500 MG PO TABS
500.0000 mg | ORAL_TABLET | Freq: Three times a day (TID) | ORAL | Status: DC | PRN
  Administered 2021-03-30 – 2021-04-04 (×11): 500 mg via ORAL
  Filled 2021-03-30 (×10): qty 1

## 2021-03-30 MED ORDER — POLYETHYLENE GLYCOL 3350 17 G PO PACK
17.0000 g | PACK | Freq: Every day | ORAL | Status: DC
  Filled 2021-03-30 (×9): qty 1

## 2021-03-30 MED ORDER — ONDANSETRON HCL 4 MG PO TABS
ORAL_TABLET | ORAL | Status: AC
  Filled 2021-03-30: qty 1

## 2021-03-30 MED ORDER — LORAZEPAM 1 MG PO TABS: 1.0000 mg | ORAL_TABLET | Freq: Four times a day (QID) | ORAL | Status: DC | PRN

## 2021-03-30 MED ORDER — QUETIAPINE FUMARATE 100 MG PO TABS
250.0000 mg | ORAL_TABLET | Freq: Every day | ORAL | Status: DC
  Administered 2021-03-30: 250 mg via ORAL
  Filled 2021-03-30 (×3): qty 2.5

## 2021-03-30 MED ORDER — SUCRALFATE 1 G PO TABS
1.0000 g | ORAL_TABLET | Freq: Two times a day (BID) | ORAL | Status: DC | PRN
  Administered 2021-03-30: 1 g via ORAL

## 2021-03-30 MED ORDER — METHOCARBAMOL 500 MG PO TABS
500.0000 mg | ORAL_TABLET | Freq: Three times a day (TID) | ORAL | Status: DC
  Filled 2021-03-30 (×5): qty 1

## 2021-03-30 MED ORDER — SUCRALFATE 1 G PO TABS
ORAL_TABLET | ORAL | Status: AC
  Filled 2021-03-30: qty 1

## 2021-03-30 MED ORDER — DOCUSATE SODIUM 100 MG PO CAPS
100.0000 mg | ORAL_CAPSULE | Freq: Every day | ORAL | Status: DC
  Filled 2021-03-30 (×9): qty 1

## 2021-03-30 MED ORDER — ONDANSETRON HCL 4 MG PO TABS
4.0000 mg | ORAL_TABLET | Freq: Two times a day (BID) | ORAL | Status: DC | PRN
  Administered 2021-03-30 – 2021-04-03 (×2): 4 mg via ORAL
  Filled 2021-03-30 (×2): qty 1

## 2021-03-30 MED ORDER — SUCRALFATE 1 GM/10ML PO SUSP
1.0000 g | Freq: Three times a day (TID) | ORAL | Status: DC
  Administered 2021-03-30 – 2021-04-05 (×10): 1 g via ORAL
  Filled 2021-03-30 (×23): qty 10

## 2021-03-30 NOTE — Progress Notes (Signed)
Adult Psychoeducational Group Note  Date:  03/30/2021 Time:  7:14 PM  Group Topic/Focus:  Emotional Education:   The focus of this group is to discuss what feelings/emotions are, and how they are experienced.  Participation Level:  Active  Participation Quality:  Appropriate  Affect:  Appropriate  Cognitive:  Alert  Insight: Appropriate  Engagement in Group:  Engaged  Modes of Intervention:  Discussion  Additional Comments:  Pt attended group and participated in discussion.  Bridger Pizzi R Maddock Finigan 03/30/2021, 7:14 PM

## 2021-03-30 NOTE — Progress Notes (Addendum)
Pt stated she was feeling much better from last night. Pt given PRN Vistaril with HS medication. Pt stated she normally takes her Neurontin at night. Pt encouraged to talk to the doctor to change the medication time. Pt informed with her getting the medication at  0800, 1200 and 1700 leaves her getting her last dose at 1700 then getting her next dose 15 hours later the next morning at 0800.     03/30/21 2000  Psych Admission Type (Psych Patients Only)  Admission Status Voluntary  Psychosocial Assessment  Patient Complaints Anxiety  Eye Contact Fair  Facial Expression Anxious;Worried  Affect Anxious;Appropriate to circumstance  Speech Logical/coherent  Interaction Assertive  Motor Activity Other (Comment) (WNL)  Appearance/Hygiene Unremarkable  Behavior Characteristics Cooperative;Anxious  Mood Pleasant  Thought Process  Coherency WDL  Content WDL  Delusions None reported or observed  Perception WDL  Hallucination None reported or observed  Judgment Impaired  Confusion None  Danger to Self  Current suicidal ideation? Denies  Self-Injurious Behavior No self-injurious ideation or behavior indicators observed or expressed   Agreement Not to Harm Self Yes  Description of Agreement Verbal agreement  Danger to Others  Danger to Others None reported or observed

## 2021-03-30 NOTE — Progress Notes (Signed)
The patient attended last evening's group and was appropriate.

## 2021-03-30 NOTE — Progress Notes (Addendum)
Pt stated she takes Sucralfate at home to help with her stomach. Pt asked is she could get some. Pt also requesting Zofran

## 2021-03-30 NOTE — Progress Notes (Signed)
   03/30/21 0819  Vital Signs  Temp (!) 97.5 F (36.4 C)  Temp Source Oral  Pulse Rate 92  Pulse Rate Source Monitor  BP 115/83  BP Location Left Arm  BP Method Automatic  Patient Position (if appropriate) Sitting  Oxygen Therapy  SpO2 99 %  D: Pt. Denies SI/HI/AVH. Pt. Rated anxiety 8/10. Pt. Was tearful in the hall. Pt. Was out in open areas and was social and even intrusive with staff and other pts. Pt .complained of anxiety, nausea, stomach ache. Pt. Later refused colace and miralax because her "pooping was too good" A:  Patient took scheduled medicine.  Pt  given vistaril for anxiety and klonopin for anxiety later. P. Given zofran for nausea, and sucrafate for stomach acheSupport and encouragement provided Routine safety checks conducted every 15 minutes. Patient  Informed to notify staff with any concerns.   R:  Safety maintained.

## 2021-03-30 NOTE — Progress Notes (Signed)
Recreation Therapy Notes  Animal-Assisted Activity (AAA) Program Checklist/Progress Notes Patient Eligibility Criteria Checklist & Daily Group note for Rec Tx Intervention  Date: 8.2.22 Time: 71 Location: [place]  AAA/T Program Assumption of Risk Form signed by Teacher, music or Parent Legal Guardian YES  Patient is free of allergies or severe asthma YES  Patient reports no fear of animals YES  Patient reports no history of cruelty to animals YES   Patient understands his/her participation is voluntary YES  Patient washes hands before animal contact YES  Patient washes hands after animal contact YES  Behavioral Response: Engaged  Education: Contractor, Appropriate Animal Interaction   Education Outcome: Acknowledges understanding/In group clarification offered/Needs additional education.   Clinical Observations/Feedback:  Pt attended and participated in activity.     Victorino Sparrow, LRT/CTRS        Victorino Sparrow A 03/30/2021 3:49 PM

## 2021-03-30 NOTE — Progress Notes (Signed)
The patient shared in group that she enjoyed going to pet therapy and that she felt that she was more involved in groups today. Her goal for tomorrow is to get some money from her family?

## 2021-03-30 NOTE — Progress Notes (Addendum)
[facility] MD Progress Note  03/30/2021 5:33 PM [name]  MRN:  [id number] Subjective:   [name] is a 52 YOF with a PPHx of BPAD I, PTSD, GAD, and depression, presenting voluntarily with SI.  24 hr events: no documented behavioral issues, no PRN medications given for agitation.  On interview this morning, patient is labile and has several somatic complaints. She begins the interview by reporting she had impacted stool, which she was able to relieve digitally. She reports that the stool had bright red blood on it and reports a history of hemorrhoids and says this bleeding was similar to what she has experienced previously. She is anxious appearing and becomes even more anxious when a medication change is suggested. She is attached to her Seroquel and feels it is an excellent medicine, despite the fact that both her aunt and boyfriend feel it does not work well for her. She also becomes upset about her Klonopin and insists that the treatment team is not giving her enough. (This is despite agreeing with this interviewer on the current regimen two days ago). Patient becomes tearful and says that she is "fighting so hard". She reports passive SI but contracts for safety. She reports continued AH of "chatter" and voices telling her she is ugly. She reports VH of "trailers". Patient given encouragement and support.   Principal Problem: Bipolar affective disorder, depressed, severe, with psychotic behavior ([place]) Diagnosis: Principal Problem:   Bipolar affective disorder, depressed, severe, with psychotic behavior ([place]) Active Problems:   PTSD (post-traumatic stress disorder)   Suicidal ideation   Cannabis abuse  Total Time Spent in Direct Patient Care: I personally spent 30 minutes on the unit in direct patient care. The direct patient care time included face-to-face time with the patient, reviewing the patient's chart, communicating with other professionals, and coordinating care. Greater than 50%  of this time was spent in counseling or coordinating care with the patient regarding goals of hospitalization, psycho-education, and discharge planning needs.  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   Jaundice    MRSA (methicillin resistant Staphylococcus aureus)    hospitalized for 48 hours in Dec 2016   Staph infection    "in my blood"    Past Surgical History:  Procedure Laterality Date   CHOLECYSTECTOMY  08/02/2012   Procedure: LAPAROSCOPIC CHOLECYSTECTOMY WITH INTRAOPERATIVE CHOLANGIOGRAM;  Surgeon: Rolm Bookbinder, MD;  Location: [place];  Service: General;  Laterality: N/A;   COLONOSCOPY WITH PROPOFOL  04/09/2014   COLONOSCOPY WITH PROPOFOL N/A 07/14/2016   Procedure: COLONOSCOPY WITH PROPOFOL;  Surgeon: Milus Banister, MD;  Location: [facility];  Service: Endoscopy;  Laterality: N/A;   DILATION AND CURETTAGE  OF UTERUS     w/ hysteroscopy to remove cyst   ESOPHAGOGASTRODUODENOSCOPY N/A 12/02/2017   Procedure: ESOPHAGOGASTRODUODENOSCOPY (EGD);  Surgeon: Carol Ada, MD;  Location: [place];  Service: Endoscopy;  Laterality: N/A;   ESOPHAGOGASTRODUODENOSCOPY (EGD) WITH PROPOFOL  04/09/2014   ESOPHAGOGASTRODUODENOSCOPY (EGD) WITH PROPOFOL N/A 07/14/2016   Procedure: ESOPHAGOGASTRODUODENOSCOPY (EGD) WITH PROPOFOL;  Surgeon: Milus Banister, MD;  Location: [facility];  Service: Endoscopy;  Laterality: N/A;   HYSTEROSCOPY WITH D & C  06/22/2012   Procedure: DILATATION AND CURETTAGE /HYSTEROSCOPY;  Surgeon: Lahoma Crocker, MD;  Location: [place] [facility];  Service: Gynecology;  Laterality: N/A;   KNEE ARTHROSCOPY  2011   left   ORIF TOE FRACTURE Left 11/05/2014   Procedure: OPEN REDUCTION INTERNAL FIXATION (ORIF) LEFT FIFTH METATARSAL (TOE) FRACTURE WITH CALCANEAL BONE GRAFT;  Surgeon: Dorna Leitz, MD;  Location: [place];  Service: Orthopedics;  Laterality: Left;   WISDOM TOOTH EXTRACTION     Family History:  Family History  Problem Relation Age of Onset   Heart disease Father    Stroke Father    Heart attack Father    Skin cancer Father    Diabetes Father    Uterine cancer Mother 85   COPD Mother    Cancer - Colon Maternal Grandfather        mets to bone   Stomach cancer Paternal Grandmother    Cervical cancer Sister        dx between 68-33   Migraines Sister    Prostate cancer Maternal Uncle 47       mets to bone   Ovarian cancer Paternal Aunt    Heart attack Paternal Grandfather    Skin cancer Paternal Aunt    Family Psychiatric  History: aunt with schizophrenia and cousin with BPAD Social History:  Patient lives in a home with her boyfriend, their relationship is as described in the H and P. She reports 6 previous SA and several past hospitalizations. 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      Sleep: Fair  Appetite:  Fair  Current Medications: Current Facility-Administered Medications  Medication Dose Route Frequency Provider Last Rate Last Admin   acetaminophen (TYLENOL) tablet 650 mg  650 mg Oral Q6H PRN Rosezetta Schlatter, MD   650 mg at 03/29/21 0540   alum & mag hydroxide-simeth (MAALOX/MYLANTA) 200-200-20 MG/5ML suspension 30 mL  30 mL Oral Q4H PRN Rosezetta Schlatter, MD       clonazePAM (KLONOPIN) tablet 1 mg  1 mg  Oral Daily PRN Corky Sox, MD   1 mg at 03/30/21 1410   docusate sodium (COLACE) capsule 100 mg  100 mg Oral Daily Nelda Marseille, Candice Tobey E, MD       gabapentin (NEURONTIN) capsule 600 mg  600 mg Oral TID Corky Sox, MD   600 mg at 03/30/21 1255   hydrOXYzine (ATARAX/VISTARIL) tablet 25 mg  25 mg Oral TID PRN Rosezetta Schlatter, MD   25 mg at 03/30/21 1258   LORazepam (ATIVAN) tablet 1 mg  1 mg Oral Q6H PRN Corky Sox, MD       magnesium hydroxide (MILK OF MAGNESIA) suspension 30 mL  30 mL Oral Daily PRN Rosezetta Schlatter, MD   30 mL at 03/29/21 2150   methocarbamol (ROBAXIN) tablet 500 mg  500 mg Oral Q8H PRN Harlow Asa, MD       nicotine (NICODERM CQ -  dosed in mg/24 hours) patch 21 mg  21 mg Transdermal Daily Sharma Covert, MD   21 mg at 03/30/21 0836   ondansetron [facility]) tablet 4 mg  4 mg Oral BID PRN Corky Sox, MD   4 mg at 03/30/21 [id number]   polyethylene glycol (MIRALAX / GLYCOLAX) packet 17 g  17 g Oral Daily Nelda Marseille, Chivon Lepage E, MD       prazosin (MINIPRESS) capsule 2 mg  2 mg Oral QHS Corky Sox, MD   2 mg at 03/29/21 2148   QUEtiapine (SEROQUEL) tablet 25 mg  25 mg Oral BID Corky Sox, MD   25 mg at 03/30/21 [id number]   QUEtiapine (SEROQUEL) tablet 250 mg  250 mg Oral QHS Corky Sox, MD       sucralfate (CARAFATE) 1 GM/10ML suspension 1 g  1 g Oral TID with meals Corky Sox, MD       SUMAtriptan Dellis Filbert) tablet 100 mg  100 mg Oral BID PRN Corky Sox, MD       traZODone (DESYREL) tablet 50 mg  50 mg Oral QHS PRN Rosezetta Schlatter, MD   50 mg at 03/29/21 2147    Lab Results:  Results for orders placed or performed during the hospital encounter of 03/28/21 (from the past 48 hour(s))  Ammonia     Status: None   Collection Time: 03/29/21  6:49 PM  Result Value Ref Range   Ammonia 32 9 - 35 umol/L    Comment: Performed at [facility], [place] [address] [place], [place] [address]  Hepatic function panel     Status: Abnormal   Collection  Time: 03/29/21  6:49 PM  Result Value Ref Range   Total Protein 6.9 6.5 - 8.1 g/dL   Albumin 4.0 3.5 - 5.0 g/dL   AST 21 15 - 41 U/L   ALT 41 0 - 44 U/L   Alkaline Phosphatase 97 38 - 126 U/L   Total Bilirubin 0.2 (L) 0.3 - 1.2 mg/dL   Bilirubin, Direct 0.1 0.0 - 0.2 mg/dL   Indirect Bilirubin 0.1 (L) 0.3 - 0.9 mg/dL    Comment: Performed at [facility], [place] [address], [place], [place] [address]    Blood Alcohol level:  Lab Results  Component Value Date   ETH <10 12/15/2020   ETH <10 [id number]    Metabolic Disorder Labs: Lab Results  Component Value Date   HGBA1C 5.9 (H) 03/28/2021   MPG 122.63 03/28/2021   MPG 122.63 05/14/2020   Lab Results  Component Value Date   PROLACTIN 4.8 05/14/2020   Lab Results  Component Value Date   CHOL 192 03/28/2021   TRIG 105 03/28/2021   HDL 49 03/28/2021   CHOLHDL 3.9 03/28/2021   VLDL 21 03/28/2021   LDLCALC 122 (H) 03/28/2021   LDLCALC 205 (H) 05/14/2020    Physical Findings: AIMS: not yet assessed CIWA:  CIWA-Ar Total: 3NA COWS:   NA  Musculoskeletal: Strength & Muscle Tone: not yet assessed Gait & Station: normal Patient leans: N/A  Psychiatric Specialty Exam:  Presentation  General Appearance: Casual  Eye Contact:Fair  Speech:coherent, normal rate  Speech Volume:Normal  Handedness:Right   Mood and Affect  Mood:Anxious; Dysphoric  Affect:Labile; Congruent   Thought Process  Thought Processes:Coherent; Linear but ruminative about somatic issues and medication regimen  Descriptions of Associations:Intact  Orientation:Full (Time, Place and Person)  Thought Content:Denies paranoia - is not grossly responding to internal/external stimuli on exam  History of Schizophrenia/Schizoaffective disorder:No  Duration of Psychotic Symptoms:Greater than six months  Hallucinations:Hallucinations: Auditory; Visual Description of Auditory Hallucinations: per progress note Description of  Visual Hallucinations: per progress note  Ideas of Reference:None  Suicidal Thoughts:Suicidal Thoughts: Yes, Passive SI Passive Intent and/or Plan: Without Intent; Without Plan; Without Means to Carry Out; Without Access to Means  Homicidal Thoughts:Homicidal Thoughts: No   Sensorium  Memory:Immediate Good; Recent Good; Remote Good  Judgment:Poor  Insight:Poor   Executive Functions  Concentration:Good  Attention Span:Good  [place] of Knowledge:Good  Language:Good   Psychomotor Activity  Psychomotor Activity:Psychomotor Activity: Normal   Assets  Assets:Physical Health; Housing; Social Support   Sleep  Sleep:Sleep: Fair Number of Hours of Sleep: 6.75    Physical Exam: Physical Exam Vitals and nursing note reviewed.  HENT:     Head: Normocephalic and atraumatic.  Pulmonary:     Effort: Pulmonary effort is normal.  Neurological:     General: No focal deficit present.     Mental Status: She is alert and oriented to person, place, and time.   Review of Systems  Respiratory:  Negative for shortness of breath.   Cardiovascular:  Negative for chest pain.  Gastrointestinal:  Positive for blood in stool and nausea. Negative for vomiting.  Neurological:  Negative for headaches.  Blood pressure 115/83, pulse 92, temperature (!) 97.5 F (36.4 C), temperature source Oral, resp. rate 18, height 5' (1.524 m), weight 81.6 kg, last menstrual period 06/14/2012, SpO2 99 %. Body mass index is 35.15 kg/m.   Treatment Plan Summary: Daily contact with patient to assess and evaluate symptoms and progress in treatment and Medication management  Safety and Monitoring -- VOLUNTARY admission to inpatient psychiatric unit for safety, stabilization and treatment -- Daily contact with patient to assess and evaluate symptoms and progress in treatment -- Patient's case to be discussed in multi-disciplinary team meeting -- Observation Level : q15 minute checks -- Vital  signs:  q12 hours -- Precautions: suicide   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 -Supportive psychotherapy -Continue Seroquel 25 mg BID and increase QHS dose to 250 mg QHS (restarted 7/31, increased 8/2) Antipsychotic labs             EKG: sinus rhythm with 1st degree AV block (PR 217), pt is asymptomatic, Qtc 437 - will recheck EKG tomorrow with dose titration on Seroquel             Lipids: LDL 122, otherwise WNL             A1C: 5.9 AIMS: not yet assessed -Continue Klonopin 1 mg daily PRN for anxiety -Continue CIWA scoring for potential w/d -PRN Ativan 1 mg for CIWA >12 -Work with [facility] [facility] regarding disposition   Cannabis Use Disorder -Abstinence encouraged -Motivational Interviewing   Tobacco use disorder -as above -NRT   PTSD with nightmares -Continue Prazosin 2 mg QHS for nightmares Monitor BP and orthostasis   Migraine Headaches Topomax 100 mg D/C'd given ineffectiveness Continue Imitrex 100 mg BID PRN   Chronic pain -continue home gabapentin 600 mg TID - Start Robaxin '500mg'$  tid PRN spasm   Constipation - Start Miralax daily and colace '100mg'$  daily  GERD and add standing Carafate TID  Continue PRN's: Tylenol, Maalox, Atarax, Milk of Magnesia, Trazodone, Zofran  Medical Management Covid negative CMP: AST of 47, K of 3.1 (replaced) -Ammonia and repeat HFP WNL -Recheck BMP CBC: unremarkable -Repeat CBC to check for anemia EtOH: not collected UDS: MJ, BNZ Upreg: neg TSH: 3   Merrilee Seashore  Alvie Heidelberg PGY-1, Psychiatry

## 2021-03-31 MED ORDER — QUETIAPINE FUMARATE 300 MG PO TABS
300.0000 mg | ORAL_TABLET | Freq: Every day | ORAL | Status: DC
  Administered 2021-03-31 – 2021-04-04 (×5): 300 mg via ORAL
  Filled 2021-03-31 (×7): qty 1

## 2021-03-31 MED ORDER — MIRTAZAPINE 7.5 MG PO TABS
7.5000 mg | ORAL_TABLET | Freq: Every day | ORAL | Status: DC
  Administered 2021-03-31: 7.5 mg via ORAL
  Filled 2021-03-31 (×4): qty 1

## 2021-03-31 NOTE — Progress Notes (Signed)
Recreation Therapy Notes  Date: 8.3.22 Time: 0930 Location: [address] Dayroom  Group Topic: Stress Management   Goal Area(s) Addresses:  Patient will actively participate in stress management techniques presented during session.  Patient will successfully identify benefit of practicing stress management post d/c.   Behavioral Response: Appropriate  Intervention: Guided exercise with ambient sound and script  Activity :Guided Imagery  LRT read a script that focused on finding peace and relaxation while envisioning being on the beach at sunrise. Patients were given suggestions of ways to access scripts post d/c and encouraged to explore Youtube and other apps available on smartphones, tablets, and computers.   Education:  Stress Management, Discharge Planning.   Education Outcome: Acknowledges education  Clinical Observations/Feedback: Patient actively engaged in technique introduced, expressed no concerns.      Victorino Sparrow, LRT/CTRS         Victorino Sparrow A 03/31/2021 11:44 AM

## 2021-03-31 NOTE — Progress Notes (Signed)
Pt stated she was doing better, pt visible on the unit this evening. Pt given PRN Klonopin and Robaxin per [facility] with HS medication     03/31/21 2300  Psych Admission Type (Psych Patients Only)  Admission Status Voluntary  Psychosocial Assessment  Patient Complaints Anxiety  Eye Contact Fair  Facial Expression Anxious;Worried  Affect Anxious;Appropriate to circumstance  Speech Logical/coherent  Interaction Assertive  Motor Activity Other (Comment) (WNL)  Appearance/Hygiene Unremarkable  Behavior Characteristics Cooperative;Anxious  Mood Pleasant  Thought Process  Coherency WDL  Content WDL  Delusions None reported or observed  Perception WDL  Hallucination None reported or observed  Judgment Impaired  Confusion None  Danger to Self  Current suicidal ideation? Denies  Self-Injurious Behavior No self-injurious ideation or behavior indicators observed or expressed   Agreement Not to Harm Self Yes  Description of Agreement Verbal agreement  Danger to Others  Danger to Others None reported or observed

## 2021-03-31 NOTE — Progress Notes (Signed)
Adult Psychoeducational Group Note  Date:  03/31/2021 Time:  5:23 PM  Group Topic/Focus:  Goals Group:   The focus of this group is to help patients establish daily goals to achieve during treatment and discuss how the patient can incorporate goal setting into their daily lives to aide in recovery.  Participation Level:  Active  Participation Quality:  Appropriate and Attentive  Affect:  Appropriate  Cognitive:  Alert and Appropriate  Insight: Appropriate and Good  Engagement in Group:  Engaged  Modes of Intervention:  Activity  Additional Comments:  Pt has a goal of working on controlling her anxiety without all of the medication. Pt has worked on the Engineer, technical sales and found that she can make improvements on caring for herself. Pt struggles with painful emotions and working on how to manage these with her MD.   [name] 03/31/2021, 5:23 PM

## 2021-03-31 NOTE — Progress Notes (Signed)
Pt up requesting "something PRN to help me sleep", pt given PRN Trazodone per [facility]

## 2021-03-31 NOTE — Progress Notes (Signed)
The patient rated her day as a 8 out of 10 since she enjoyed the groups. The patient also stated that she learned something about "compassion: today. Her goal for tomorrow is to see if her new medication has been effective.

## 2021-03-31 NOTE — Progress Notes (Signed)
Pt was calm and cooperative this shift. Pt Taking medications without incident and no adverse reactions were noted.  Pt remains safe with q 15 min room checks in place.    03/31/21 0825  Psych Admission Type (Psych Patients Only)  Admission Status Voluntary  Psychosocial Assessment  Patient Complaints Anxiety  Eye Contact Fair  Facial Expression Anxious;Worried  Affect Anxious;Appropriate to circumstance  Speech Logical/coherent  Interaction Assertive  Motor Activity Other (Comment) (WNL)  Appearance/Hygiene Unremarkable  Behavior Characteristics Cooperative;Anxious  Mood Pleasant  Thought Process  Coherency WDL  Content WDL  Delusions None reported or observed  Perception WDL  Hallucination None reported or observed  Judgment Impaired  Confusion None  Danger to Self  Current suicidal ideation? Denies  Self-Injurious Behavior No self-injurious ideation or behavior indicators observed or expressed   Agreement Not to Harm Self Yes  Description of Agreement Verbal agreement  Danger to Others  Danger to Others None reported or observed

## 2021-03-31 NOTE — [facility] Group Notes (Signed)
Type of Therapy and Topic:  Group Therapy:  Strengths Exploration   Participation Level: Active  Description of Group: This group allows individuals to explore their strengths, learn to use strengths in new ways to improve well-being. Strengths-based interventions involve identifying strengths, understanding how they are used, and learning new ways to apply them. Individuals will identify their strengths, and then explore their roles in different areas of life (relationships, professional life, and personal fulfillment). Individuals will think about ways in which they currently use their strengths, along with new ways they could begin using them.    Therapeutic Goals Patient will verbalize two of their strengths Patient will identify how their strengths are currently used Patient will identify two new ways to apply their strengths  Patients will create a plan to apply their strengths in their daily lives     Summary of Patient Progress:  [name] shared that she is good at helping other people and making them laugh.  The Pt participated in the group discussion and shared thoughts and ideas with their peers.  The Pt also accepted the worksheet that was provided.

## 2021-03-31 NOTE — Progress Notes (Addendum)
[facility] MD Progress Note  03/31/2021 1:28 PM DENIS SKALKA  MRN:  [id number] Subjective:   Suzi Mcglown is a 41 YOF with a PPHx of BPAD I, PTSD, GAD, and depression, presenting voluntarily with SI.  24 hr events: no documented behavioral issues, no PRN medications given for agitation.  On interview this morning, patient appears improved compared to yesterday. She is less labile and anxious. She reports sleeping well with the increased dose of Seroquel and endorses improved anxiety. She reports frustration with her roommate who was up late in the night bothering her. She denies AVH, a significant improvement from previous days. She is still unsure of her discharge disposition. She is amenable to a trial of Remeron.   Principal Problem: Bipolar affective disorder, depressed, severe, with psychotic behavior ([place]) Diagnosis: Principal Problem:   Bipolar affective disorder, depressed, severe, with psychotic behavior ([place]) Active Problems:   PTSD (post-traumatic stress disorder)   Suicidal ideation   Cannabis abuse  Total Time Spent in Direct Patient Care: I personally spent 30 minutes on the unit in direct patient care. The direct patient care time included face-to-face time with the patient, reviewing the patient's chart, communicating with other professionals, and coordinating care. Greater than 50% of this time was spent in counseling or coordinating care with the patient regarding goals of hospitalization, psycho-education, and discharge planning needs.  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   Jaundice    MRSA (methicillin resistant Staphylococcus aureus)    hospitalized for 48 hours in Dec 2016   Staph infection    "in my blood"    Past Surgical History:  Procedure Laterality Date   CHOLECYSTECTOMY  08/02/2012   Procedure: LAPAROSCOPIC CHOLECYSTECTOMY WITH INTRAOPERATIVE CHOLANGIOGRAM;  Surgeon: Rolm Bookbinder, MD;  Location: [place];  Service: General;  Laterality: N/A;   COLONOSCOPY WITH PROPOFOL  04/09/2014   COLONOSCOPY WITH PROPOFOL N/A 07/14/2016   Procedure: COLONOSCOPY WITH PROPOFOL;  Surgeon: Milus Banister, MD;  Location: [facility];  Service: Endoscopy;  Laterality: N/A;   DILATION AND CURETTAGE OF UTERUS     w/ hysteroscopy to remove cyst   ESOPHAGOGASTRODUODENOSCOPY N/A 12/02/2017   Procedure: ESOPHAGOGASTRODUODENOSCOPY (EGD);  Surgeon: Carol Ada, MD;  Location: [place];  Service: Endoscopy;  Laterality: N/A;   ESOPHAGOGASTRODUODENOSCOPY (EGD) WITH PROPOFOL  04/09/2014   ESOPHAGOGASTRODUODENOSCOPY (EGD) WITH PROPOFOL N/A 07/14/2016   Procedure: ESOPHAGOGASTRODUODENOSCOPY (EGD) WITH PROPOFOL;  Surgeon: Milus Banister, MD;  Location: [facility];  Service: Endoscopy;  Laterality: N/A;   HYSTEROSCOPY WITH D & C  06/22/2012   Procedure: DILATATION AND CURETTAGE /HYSTEROSCOPY;  Surgeon: Lahoma Crocker, MD;  Location: [facility];  Service: Gynecology;  Laterality: N/A;  KNEE ARTHROSCOPY  2011   left   ORIF TOE FRACTURE Left 11/05/2014   Procedure: OPEN REDUCTION INTERNAL FIXATION (ORIF) LEFT FIFTH METATARSAL (TOE) FRACTURE WITH CALCANEAL BONE GRAFT;  Surgeon: Dorna Leitz, MD;  Location: [place];  Service: Orthopedics;  Laterality: Left;   WISDOM TOOTH EXTRACTION     Family History:  Family History  Problem Relation  Age of Onset   Heart disease Father    Stroke Father    Heart attack Father    Skin cancer Father    Diabetes Father    Uterine cancer Mother 43   COPD Mother    Cancer - Colon Maternal Grandfather        mets to bone   Stomach cancer Paternal Grandmother    Cervical cancer Sister        dx between 67-33   Migraines Sister    Prostate cancer Maternal Uncle 65       mets to bone   Ovarian cancer Paternal Aunt    Heart attack Paternal Grandfather    Skin cancer Paternal Aunt    Family Psychiatric  History: aunt with schizophrenia and cousin with BPAD Social History:  Patient lives in a home with her boyfriend, their relationship is as described in the H and P. She reports 6 previous SA and several past hospitalizations. 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      Sleep: Fair  Appetite:  Fair  Current Medications: Current Facility-Administered Medications  Medication Dose Route Frequency Provider Last Rate Last Admin   acetaminophen (TYLENOL) tablet 650 mg  650 mg Oral Q6H PRN Rosezetta Schlatter, MD   650 mg at 03/30/21 2205   alum & mag hydroxide-simeth (MAALOX/MYLANTA) 200-200-20 MG/5ML suspension 30 mL  30 mL Oral Q4H PRN Rosezetta Schlatter, MD   30 mL at 03/31/21 0825   clonazePAM (KLONOPIN) tablet 1 mg  1 mg Oral Daily PRN Corky Sox, MD   1 mg at 03/30/21 1410   docusate sodium (COLACE) capsule 100 mg  100 mg Oral Daily Nelda Marseille, Shawnelle Spoerl E, MD       gabapentin (NEURONTIN) capsule 600 mg  600 mg Oral TID Corky Sox, MD   600 mg at 03/31/21 1142   hydrOXYzine (ATARAX/VISTARIL) tablet 25 mg  25 mg Oral TID PRN Rosezetta Schlatter, MD   25 mg at 03/30/21 2108   LORazepam (ATIVAN) tablet 1 mg  1 mg Oral Q6H PRN Corky Sox, MD       magnesium hydroxide (MILK OF MAGNESIA) suspension 30 mL  30 mL Oral Daily PRN Rosezetta Schlatter, MD   30 mL at 03/29/21 2150   methocarbamol (ROBAXIN)  tablet 500 mg  500 mg Oral Q8H PRN Viann Fish E, MD   500 mg at 03/31/21 0825   nicotine (NICODERM CQ - dosed in mg/24 hours) patch 21 mg  21 mg Transdermal Daily Sharma Covert, MD   21 mg at 03/31/21 0820   ondansetron (ZOFRAN) tablet 4 mg  4 mg Oral BID PRN Corky Sox, MD   4 mg at 03/30/21 0837   polyethylene glycol (MIRALAX / GLYCOLAX) packet 17 g  17 g Oral Daily Nelda Marseille, Lemya Greenwell E, MD       prazosin (MINIPRESS) capsule 2 mg  2 mg Oral QHS Corky Sox, MD   2 mg at 03/30/21 2107   QUEtiapine (SEROQUEL) tablet 25 mg  25 mg  Oral BID Corky Sox, MD   25 mg at 03/31/21 [id number]   QUEtiapine (SEROQUEL) tablet 250 mg  250 mg Oral QHS Corky Sox, MD   250 mg at 03/30/21 2105   sucralfate (CARAFATE) 1 GM/10ML suspension 1 g  1 g Oral TID with meals Corky Sox, MD   1 g at 03/30/21 1900   SUMAtriptan (IMITREX) tablet 100 mg  100 mg Oral BID PRN Corky Sox, MD       traZODone (DESYREL) tablet 50 mg  50 mg Oral QHS PRN Rosezetta Schlatter, MD   50 mg at 03/31/21 0034    Lab Results:  Results for orders placed or performed during the hospital encounter of 03/28/21 (from the past 48 hour(s))  Ammonia     Status: None   Collection Time: 03/29/21  6:49 PM  Result Value Ref Range   Ammonia 32 9 - 35 umol/L    Comment: Performed at [facility], [place] [address], [place], [place] [address]  Hepatic function panel     Status: Abnormal   Collection Time: 03/29/21  6:49 PM  Result Value Ref Range   Total Protein 6.9 6.5 - 8.1 g/dL   Albumin 4.0 3.5 - 5.0 g/dL   AST 21 15 - 41 U/L   ALT 41 0 - 44 U/L   Alkaline Phosphatase 97 38 - 126 U/L   Total Bilirubin 0.2 (L) 0.3 - 1.2 mg/dL   Bilirubin, Direct 0.1 0.0 - 0.2 mg/dL   Indirect Bilirubin 0.1 (L) 0.3 - 0.9 mg/dL    Comment: Performed at [facility], [place] [address] [place], [place] [id number]  Basic metabolic panel     Status: Abnormal   Collection Time: 03/30/21  6:39 PM  Result Value  Ref Range   Sodium 139 135 - 145 mmol/L   Potassium 4.5 3.5 - 5.1 mmol/L   Chloride 105 98 - 111 mmol/L   CO2 28 22 - 32 mmol/L   Glucose, Bld 112 (H) 70 - 99 mg/dL    Comment: Glucose reference range applies only to samples taken after fasting for at least 8 hours.   BUN 12 6 - 20 mg/dL   Creatinine, Ser 1.08 (H) 0.44 - 1.00 mg/dL   Calcium 9.2 8.9 - 10.3 mg/dL   GFR, Estimated >60 >60 mL/min    Comment: (NOTE) Calculated using the CKD-EPI Creatinine Equation (2021)    Anion gap 6 5 - 15    Comment: Performed at [facility], [place] [address] [place],  [address]  CBC with Differential/Platelet     Status: None   Collection Time: 03/30/21  6:39 PM  Result Value Ref Range   WBC 6.4 4.0 - 10.5 K/uL   RBC 4.59 3.87 - 5.11 MIL/uL   Hemoglobin 14.0 12.0 - 15.0 g/dL   HCT 44.0 36.0 - 46.0 %   MCV 95.9 80.0 - 100.0 fL   MCH 30.5 26.0 - 34.0 pg   MCHC 31.8 30.0 - 36.0 g/dL   RDW 13.9 11.5 - 15.5 %   Platelets 240 150 - 400 K/uL   nRBC 0.0 0.0 - 0.2 %   Neutrophils Relative % 60 %   Neutro Abs 3.9 1.7 - 7.7 K/uL   Lymphocytes Relative 29 %   Lymphs Abs 1.9 0.7 - 4.0 K/uL   Monocytes Relative 7 %   Monocytes Absolute 0.5 0.1 - 1.0 K/uL   Eosinophils Relative 2 %   Eosinophils Absolute 0.1 0.0 -  0.5 K/uL   Basophils Relative 1 %   Basophils Absolute 0.0 0.0 - 0.1 K/uL   Immature Granulocytes 1 %   Abs Immature Granulocytes 0.03 0.00 - 0.07 K/uL    Comment: Performed at [facility], [place] [address] [place], [place] [address]    Blood Alcohol level:  Lab Results  Component Value Date   ETH <10 12/15/2020   ETH <10 [id number]    Metabolic Disorder Labs: Lab Results  Component Value Date   HGBA1C 5.9 (H) 03/28/2021   MPG 122.63 03/28/2021   MPG 122.63 05/14/2020   Lab Results  Component Value Date   PROLACTIN 4.8 05/14/2020   Lab Results  Component Value Date   CHOL 192 03/28/2021   TRIG 105 03/28/2021   HDL 49 03/28/2021    CHOLHDL 3.9 03/28/2021   VLDL 21 03/28/2021   LDLCALC 122 (H) 03/28/2021   LDLCALC 205 (H) 05/14/2020    Physical Findings: AIMS: not yet assessed CIWA:  CIWA-Ar Total: 3NA COWS:   NA  Musculoskeletal: Strength & Muscle Tone: not assessed Gait & Station: normal Patient leans: N/A  Psychiatric Specialty Exam:  Presentation  General Appearance: Appropriate for Environment  Eye Contact:Fair  Speech:coherent, normal rate  Speech Volume:Normal  Handedness:Right   Mood and Affect  Mood: Anxious and mildly dysphoric   Affect: at times tearful when discussing discharge plans; overall less labile   Thought Process  Thought Processes:Linear; Coherent but ruminative about somatic issues and medication regimen  Descriptions of Associations:Intact  Orientation:Full (Time, Place and Person)  Thought Content:Denies paranoia - is not grossly responding to internal/external stimuli on exam; denies AVH, ideas of reference, or first rank symptoms  History of Schizophrenia/Schizoaffective disorder:No  Duration of Psychotic Symptoms:Greater than six months  Hallucinations: None  Ideas of Reference:None  Suicidal Thoughts: Denied  Homicidal Thoughts:Homicidal Thoughts: No   Sensorium  Memory:Immediate Good; Recent Good; Remote Good  Judgment:Fair  Insight:Fair   Executive Functions  Concentration:Good  Attention Span:Good  [place] of Knowledge:Good  Language:Good   Psychomotor Activity  Psychomotor Activity:Psychomotor Activity: Normal   Assets  Assets:Housing; Data processing manager; Physical Health   Sleep  Sleep:Sleep: Fair Number of Hours of Sleep: 6    Physical Exam: Physical Exam Vitals and nursing note reviewed.  HENT:     Head: Normocephalic and atraumatic.  Pulmonary:     Effort: Pulmonary effort is normal.  Neurological:     General: No focal deficit present.     Mental Status: She is alert and oriented to person, place,  and time.   Review of Systems  Respiratory:  Negative for shortness of breath.   Cardiovascular:  Negative for chest pain.  Gastrointestinal:  Negative for vomiting.  Neurological:  Negative for headaches.  Blood pressure 122/82, pulse 81, temperature (!) 97.5 F (36.4 C), temperature source Oral, resp. rate 18, height 5' (1.524 m), weight 81.6 kg, last menstrual period 06/14/2012, SpO2 100 %. Body mass index is 35.15 kg/m.   Treatment Plan Summary: Daily contact with patient to assess and evaluate symptoms and progress in treatment and Medication management  Safety and Monitoring -- VOLUNTARY admission to inpatient psychiatric unit for safety, stabilization and treatment -- Daily contact with patient to assess and evaluate symptoms and progress in treatment -- Patient's case to be discussed in multi-disciplinary team meeting -- Observation Level : q15 minute checks -- Vital signs:  q12 hours -- Precautions: suicide   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 -Supportive psychotherapy  provided -Continue Seroquel 25 mg BID and increase QHS dose to 300 mg QHS (restarted 7/31, increased 8/3) Antipsychotic labs             EKG: sinus rhythm with 1st degree AV block (PR 217), pt is asymptomatic, Qtc 437 - will recheck EKG today with dose titration on Seroquel             Lipids: LDL 122, otherwise WNL             A1C: 5.9  CBC 03/30/21 WNL  AIMS: not yet assessed -Start Remeron 7.5 mg for mood/anxiety and sleep and monitor for signs of mood escalation with antidepressant trial -Continue Klonopin 1 mg daily PRN for anxiety -Continue CIWA scoring for potential w/d -PRN Ativan 1 mg for CIWA >12 -Work with [facility] regarding disposition   Cannabis Use Disorder -Abstinence encouraged -Motivational Interviewing   Tobacco use disorder -as above -NRT   PTSD with nightmares -Continue Prazosin 2 mg QHS for nightmares Monitor BP and orthostasis   Migraine Headaches Topomax 100 mg  D/C'd given ineffectiveness Continue Imitrex 100 mg BID PRN   Chronic pain -continue home gabapentin 600 mg TID - Start Robaxin '500mg'$  tid PRN spasm  Hypokalemia - resolved - Repeat BMP 03/30/21 shows K+ 4.5 (remainder of BMP WNL except for creatinine 1.08 which appears baseline compared to previous labs and glucose 112)   Constipation - Continue Miralax daily and colace '100mg'$  daily  GERD -Continue standing Carafate TID  Continue PRN's: Tylenol, Maalox, Atarax, Milk of Magnesia, Trazodone, Zofran  Medical Management Covid negative CMP: AST of 47, K of 3.1 (replaced) -Ammonia and repeat HFP WNL -Recheck BMP: K of 4.5 CBC: unremarkable -Repeat CBC to check for anemia: WNL EtOH: not collected UDS: MJ, BNZ Upreg: neg TSH: [place] PGY-1, Psychiatry

## 2021-04-01 MED ORDER — MIRTAZAPINE 15 MG PO TABS
15.0000 mg | ORAL_TABLET | Freq: Every day | ORAL | Status: DC
  Administered 2021-04-01 – 2021-04-04 (×4): 15 mg via ORAL
  Filled 2021-04-01 (×6): qty 1

## 2021-04-01 NOTE — Progress Notes (Signed)
Pt visible on the unit with somatic and attention seeking behaviors at times. Pt stated her boyfriend treats her bad and she had bad day after talking to him, but called him this evening and appeared to get upset later in the evening. Writer tried to explain to pt that she needs to figure out what she wants to do and try to make those things happen.     04/01/21 2200  Psych Admission Type (Psych Patients Only)  Admission Status Voluntary  Psychosocial Assessment  Patient Complaints Anxiety  Eye Contact Fair  Facial Expression Anxious  Affect Appropriate to circumstance  Speech Logical/coherent  Interaction Attention-seeking  Motor Activity Slow  Appearance/Hygiene Unremarkable  Behavior Characteristics Anxious  Mood Preoccupied  Thought Process  Coherency WDL  Content WDL  Delusions WDL;None reported or observed  Perception WDL  Hallucination None reported or observed  Judgment WDL  Confusion WDL  Danger to Self  Current suicidal ideation? Denies  Self-Injurious Behavior No self-injurious ideation or behavior indicators observed or expressed   Agreement Not to Harm Self Yes  Description of Agreement verbal  Danger to Others  Danger to Others None reported or observed

## 2021-04-01 NOTE — Progress Notes (Addendum)
[facility] MD Progress Note  04/01/2021 10:50 AM [name]  MRN:  [id number] Subjective:   [name] is a 14 YOF with a PPHx of BPAD I, PTSD, GAD, and depression, presenting voluntarily with SI.  24 hr events: no documented behavioral issues, no PRN medications given for agitation.  On interview this morning, patient appears similar to yesterday. Her thought process is logical but she appears anxious and is tearful at times. Patient reports "things got wacky last night" and she further elaborates that she felt a nurse was not caring towards her. She reports feeling overwhelmed at that time and says that she feels "broken". Encouragement was given to patient. Patient denies AVH but does say that she is "paranoid" regarding the nurse she interacted with. She reports indigestion and decreased appetite. Shew is amenable to increasing her Remeron to 15 mg.  She denies SI.  Principal Problem: Bipolar affective disorder, depressed, severe, with psychotic behavior ([place]) Diagnosis: Principal Problem:   Bipolar affective disorder, depressed, severe, with psychotic behavior ([place]) Active Problems:   PTSD (post-traumatic stress disorder)   Suicidal ideation   Cannabis abuse  Total Time Spent in Direct Patient Care: I personally spent 30 minutes on the unit in direct patient care. The direct patient care time included face-to-face time with the patient, reviewing the patient's chart, communicating with other professionals, and coordinating care. Greater than 50% of this time was spent in counseling or coordinating care with the patient regarding goals of hospitalization, psycho-education, and discharge planning needs.  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   Jaundice    MRSA (methicillin resistant Staphylococcus aureus)    hospitalized for 48 hours in Dec 2016   Staph infection    "in my blood"    Past Surgical History:  Procedure Laterality Date   CHOLECYSTECTOMY  08/02/2012   Procedure: LAPAROSCOPIC CHOLECYSTECTOMY WITH INTRAOPERATIVE CHOLANGIOGRAM;  Surgeon: Rolm Bookbinder, MD;  Location: [place];  Service: General;  Laterality: N/A;   COLONOSCOPY WITH PROPOFOL  04/09/2014   COLONOSCOPY WITH PROPOFOL N/A 07/14/2016   Procedure: COLONOSCOPY WITH PROPOFOL;  Surgeon: Milus Banister, MD;  Location: [facility];  Service: Endoscopy;  Laterality: N/A;   DILATION AND CURETTAGE OF UTERUS     w/ hysteroscopy to remove cyst   ESOPHAGOGASTRODUODENOSCOPY N/A 12/02/2017   Procedure: ESOPHAGOGASTRODUODENOSCOPY (EGD);  Surgeon: Carol Ada, MD;  Location: [place];  Service: Endoscopy;  Laterality: N/A;   ESOPHAGOGASTRODUODENOSCOPY (EGD) WITH PROPOFOL  04/09/2014   ESOPHAGOGASTRODUODENOSCOPY (EGD) WITH PROPOFOL N/A 07/14/2016   Procedure: ESOPHAGOGASTRODUODENOSCOPY (EGD) WITH PROPOFOL;  Surgeon: Milus Banister, MD;  Location: [facility];  Service: Endoscopy;  Laterality: N/A;   HYSTEROSCOPY WITH D &  C  06/22/2012   Procedure: DILATATION AND CURETTAGE /HYSTEROSCOPY;  Surgeon: Lahoma Crocker, MD;  Location: [facility];  Service: Gynecology;  Laterality: N/A;   KNEE ARTHROSCOPY  2011   left   ORIF TOE FRACTURE Left 11/05/2014   Procedure: OPEN REDUCTION INTERNAL FIXATION (ORIF) LEFT FIFTH METATARSAL (TOE) FRACTURE WITH CALCANEAL BONE GRAFT;  Surgeon: Dorna Leitz, MD;   Location: [place];  Service: Orthopedics;  Laterality: Left;   WISDOM TOOTH EXTRACTION     Family History:  Family History  Problem Relation Age of Onset   Heart disease Father    Stroke Father    Heart attack Father    Skin cancer Father    Diabetes Father    Uterine cancer Mother 67   COPD Mother    Cancer - Colon Maternal Grandfather        mets to bone   Stomach cancer Paternal Grandmother    Cervical cancer Sister        dx between 78-33   Migraines Sister    Prostate cancer Maternal Uncle 28       mets to bone   Ovarian cancer Paternal Aunt    Heart attack Paternal Grandfather    Skin cancer Paternal Aunt    Family Psychiatric  History: aunt with schizophrenia and cousin with BPAD Social History:  Patient lives in a home with her boyfriend, their relationship is as described in the H and P. She reports 6 previous SA and several past hospitalizations. 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      Sleep: Fair  Appetite:  Fair  Current Medications: Current Facility-Administered Medications  Medication Dose Route Frequency Provider Last Rate Last Admin   acetaminophen (TYLENOL) tablet 650 mg  650 mg Oral Q6H PRN Rosezetta Schlatter, MD   650 mg at 04/01/21 0810   alum & mag hydroxide-simeth (MAALOX/MYLANTA) 200-200-20 MG/5ML suspension 30 mL  30 mL Oral Q4H PRN Rosezetta Schlatter, MD   30 mL at 03/31/21 0825   clonazePAM (KLONOPIN) tablet 1 mg  1 mg Oral Daily PRN Corky Sox, MD   1 mg at 03/31/21 1946   docusate sodium (COLACE) capsule 100 mg  100 mg Oral Daily Nelda Marseille, Pavan Bring E, MD       gabapentin (NEURONTIN) capsule 600 mg  600 mg Oral TID Corky Sox, MD   600 mg at 04/01/21 0810   hydrOXYzine (ATARAX/VISTARIL) tablet 25 mg  25 mg Oral TID PRN Rosezetta Schlatter, MD   25 mg at 04/01/21 0947   LORazepam (ATIVAN) tablet 1 mg  1 mg Oral Q6H PRN Corky Sox,  MD       magnesium hydroxide (MILK OF MAGNESIA) suspension 30 mL  30 mL Oral Daily PRN Rosezetta Schlatter, MD   30 mL at 03/29/21 2150   methocarbamol (ROBAXIN) tablet 500 mg  500 mg Oral Q8H PRN Harlow Asa, MD   500 mg at 04/01/21 0810   mirtazapine (REMERON) tablet 7.5 mg  7.5 mg Oral QHS Corky Sox, MD   7.5 mg at 03/31/21 2136   nicotine (NICODERM CQ - dosed in mg/24 hours) patch 21 mg  21 mg Transdermal Daily Sharma Covert, MD   21 mg at 04/01/21 0810   ondansetron (ZOFRAN) tablet 4 mg  4 mg Oral BID PRN Corky Sox, MD   4 mg at 03/30/21 0837   polyethylene glycol (MIRALAX /  GLYCOLAX) packet 17 g  17 g Oral Daily Nelda Marseille, Shamus Desantis E, MD       prazosin (MINIPRESS) capsule 2 mg  2 mg Oral QHS Corky Sox, MD   2 mg at 03/31/21 2136   QUEtiapine (SEROQUEL) tablet 25 mg  25 mg Oral BID Corky Sox, MD   25 mg at 04/01/21 0810   QUEtiapine (SEROQUEL) tablet 300 mg  300 mg Oral QHS Corky Sox, MD   300 mg at 03/31/21 2136   sucralfate (CARAFATE) 1 GM/10ML suspension 1 g  1 g Oral TID with meals Corky Sox, MD   1 g at 04/01/21 0810   SUMAtriptan (IMITREX) tablet 100 mg  100 mg Oral BID PRN Corky Sox, MD       traZODone (DESYREL) tablet 50 mg  50 mg Oral QHS PRN Rosezetta Schlatter, MD   50 mg at 03/31/21 0034    Lab Results:  Results for orders placed or performed during the hospital encounter of 03/28/21 (from the past 48 hour(s))  Basic metabolic panel     Status: Abnormal   Collection Time: 03/30/21  6:39 PM  Result Value Ref Range   Sodium 139 135 - 145 mmol/L   Potassium 4.5 3.5 - 5.1 mmol/L   Chloride 105 98 - 111 mmol/L   CO2 28 22 - 32 mmol/L   Glucose, Bld 112 (H) 70 - 99 mg/dL    Comment: Glucose reference range applies only to samples taken after fasting for at least 8 hours.   BUN 12 6 - 20 mg/dL   Creatinine, Ser 1.08 (H) 0.44 - 1.00 mg/dL   Calcium 9.2 8.9 - 10.3 mg/dL   GFR, Estimated >60 >60 mL/min    Comment: (NOTE) Calculated using  the CKD-EPI Creatinine Equation (2021)    Anion gap 6 5 - 15    Comment: Performed at [facility], [place] [address], [place], [place] [address]  CBC with Differential/Platelet     Status: None   Collection Time: 03/30/21  6:39 PM  Result Value Ref Range   WBC 6.4 4.0 - 10.5 K/uL   RBC 4.59 3.87 - 5.11 MIL/uL   Hemoglobin 14.0 12.0 - 15.0 g/dL   HCT 44.0 36.0 - 46.0 %   MCV 95.9 80.0 - 100.0 fL   MCH 30.5 26.0 - 34.0 pg   MCHC 31.8 30.0 - 36.0 g/dL   RDW 13.9 11.5 - 15.5 %   Platelets 240 150 - 400 K/uL   nRBC 0.0 0.0 - 0.2 %   Neutrophils Relative % 60 %   Neutro Abs 3.9 1.7 - 7.7 K/uL   Lymphocytes Relative 29 %   Lymphs Abs 1.9 0.7 - 4.0 K/uL   Monocytes Relative 7 %   Monocytes Absolute 0.5 0.1 - 1.0 K/uL   Eosinophils Relative 2 %   Eosinophils Absolute 0.1 0.0 - 0.5 K/uL   Basophils Relative 1 %   Basophils Absolute 0.0 0.0 - 0.1 K/uL   Immature Granulocytes 1 %   Abs Immature Granulocytes 0.03 0.00 - 0.07 K/uL    Comment: Performed at [facility], [place] [address], [place], [place] [address]    Blood Alcohol level:  Lab Results  Component Value Date   [facility] <10 12/15/2020   ETH <10 [id number]    Metabolic Disorder Labs: Lab Results  Component Value Date   HGBA1C 5.9 (H) 03/28/2021   MPG 122.63 03/28/2021   MPG 122.63 05/14/2020   Lab Results  Component  Value Date   PROLACTIN 4.8 05/14/2020   Lab Results  Component Value Date   CHOL 192 03/28/2021   TRIG 105 03/28/2021   HDL 49 03/28/2021   CHOLHDL 3.9 03/28/2021   VLDL 21 03/28/2021   LDLCALC 122 (H) 03/28/2021   LDLCALC 205 (H) 05/14/2020    Physical Findings: AIMS: not yet assessed CIWA:  CIWA-Ar Total: 3 COWS:   NA  Musculoskeletal: Strength & Muscle Tone: not assessed Gait & Station: normal Patient leans: N/A  Psychiatric Specialty Exam:  Presentation  General Appearance: Appropriate for Environment  Eye Contact:Fair  Speech:coherent, normal  rate  Speech Volume:Normal  Handedness:Right   Mood and Affect  Mood: Anxious and mildly dysphoric   Affect: at times tearful when discussing discharge plans; overall less labile than previous days   Thought Process  Thought Processes:Coherent  but ruminative about discharge planning  Descriptions of Associations:Intact  Orientation:Full (Time, Place and Person)  Thought Content: Endorses some paranoia last night but does not appear paranoid on exam- is not grossly responding to internal/external stimuli on exam; denies AVH, ideas of reference, or first rank symptoms  History of Schizophrenia/Schizoaffective disorder:No  Duration of Psychotic Symptoms:Greater than six months  Hallucinations: None  Ideas of Reference:None  Suicidal Thoughts: Denied  Homicidal Thoughts:Homicidal Thoughts: No   Sensorium  Memory:Immediate Good; Recent Good; Remote Good  Judgment:Fair  Insight:Fair   Executive Functions  Concentration:Good  Attention Span:Good  [place] of Knowledge:Good  Language:Good   Psychomotor Activity  Psychomotor Activity:Psychomotor Activity: Normal   Assets  Assets:Physical Health; Housing; Social Support   Sleep  Sleep:Sleep: Fair Number of Hours of Sleep: 6     Physical Exam: Physical Exam Vitals and nursing note reviewed.  HENT:     Head: Normocephalic and atraumatic.  Pulmonary:     Effort: Pulmonary effort is normal.  Neurological:     General: No focal deficit present.     Mental Status: She is alert and oriented to person, place, and time.    Blood pressure 138/89, pulse 90, temperature (!) 97.5 F (36.4 C), temperature source Oral, resp. rate 16, height 5' (1.524 m), weight 81.6 kg, last menstrual period 06/14/2012, SpO2 100 %. Body mass index is 35.15 kg/m.   Treatment Plan Summary: Daily contact with patient to assess and evaluate symptoms and progress in treatment and Medication management  Safety and  Monitoring -- VOLUNTARY admission to inpatient psychiatric unit for safety, stabilization and treatment -- Daily contact with patient to assess and evaluate symptoms and progress in treatment -- Patient's case to be discussed in multi-disciplinary team meeting -- Observation Level : q15 minute checks -- Vital signs:  q12 hours -- Precautions: suicide   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 -Supportive psychotherapy provided -Continue Seroquel 25 mg BID and continue 300 mg QHS (restarted 7/31, increased 8/3) Antipsychotic labs             EKG: sinus rhythm with 1st degree AV block (PR 217), pt is asymptomatic, Qtc 437 - repeat EKG pending             Lipids: LDL 122, otherwise WNL             A1C: 5.9  CBC 03/30/21 WNL  -Increase Remeron to 15 mg for mood/anxiety and sleep and monitor for signs of mood escalation with antidepressant trial -Continue Klonopin 1 mg daily PRN for anxiety -Continue CIWA scoring for potential w/d -PRN Ativan 1 mg for CIWA >12 -Work with [facility]  regarding disposition   Cannabis Use Disorder -Abstinence encouraged -Motivational Interviewing   Tobacco use disorder -as above -NRT   PTSD with nightmares -Continue Prazosin 2 mg QHS for nightmares Monitor BP and orthostasis   Migraine Headaches Topomax 100 mg D/C'd given ineffectiveness Continue Imitrex 100 mg BID PRN   Chronic pain -continue home gabapentin 600 mg TID - Start Robaxin '500mg'$  tid PRN spasm  Hypokalemia - resolved - Repeat BMP 03/30/21 shows K+ 4.5 (remainder of BMP WNL except for creatinine 1.08 which appears baseline compared to previous labs and glucose 112)   Constipation - Continue Miralax daily and colace '100mg'$  daily  GERD -Continue standing Carafate TID  Continue PRN's: Tylenol, Maalox, Atarax, Milk of Magnesia, Trazodone, Zofran  Medical Management Covid negative CMP: AST of 47, K of 3.1 (replaced) -Ammonia and repeat HFP WNL -Recheck BMP: K of 4.5 CBC:  unremarkable -Repeat CBC to check for anemia: WNL EtOH: not collected UDS: MJ, BNZ Upreg: neg TSH: [place] PGY-1, Psychiatry

## 2021-04-01 NOTE — B[facility] Counselor (Signed)
CSW, doctor, and resident spoke with the Pt about her discharge plans.  There was a discussion about options for the Pt to ask her current partner to leave the home.  She states that she can also stay with one of her aunts until her partner leaves the home.  She states that she is also thinking about asking him to leave the home but continuing to date each other from a distance.  The Pt states that she is working with her insurance company, [facility], to get her medications delivered to the home so that her partner cannot have access to her medications.  The Pt is looking at a possible discharge of Sunday 04/04/2021.

## 2021-04-01 NOTE — Progress Notes (Signed)
[facility] Group Notes:  (Nursing/MHT/Case Management/Adjunct)  Date:  04/01/2021  Time:  2015 Type of Therapy:   wrap up group  Participation Level:  Active  Participation Quality:  Appropriate, Attentive, Sharing, and Supportive  Affect:  Appropriate  Cognitive:  Alert  Insight:  Improving  Engagement in Group:  Engaged  Modes of Intervention:  Clarification, Education, and Support  Summary of Progress/Problems: Positive thinking and self-care were discussed.   Shellia Cleverly 04/01/2021, 9:24 PM

## 2021-04-01 NOTE — Plan of Care (Signed)
Patient is active in the milieu. Anxious but pleasant and cooperative. Denying suicidal/homicidal thoughts. Denying hallucinations. Eating well and taking medication as scheduled.

## 2021-04-02 LAB — SARS CORONAVIRUS 2 (TAT 6-24 HRS): SARS Coronavirus 2: NEGATIVE

## 2021-04-02 MED ORDER — PANTOPRAZOLE SODIUM 40 MG PO TBEC
40.0000 mg | DELAYED_RELEASE_TABLET | Freq: Every day | ORAL | Status: DC
  Administered 2021-04-02 – 2021-04-05 (×4): 40 mg via ORAL
  Filled 2021-04-02 (×7): qty 1

## 2021-04-02 NOTE — [facility] Group Notes (Signed)
Pt attended and participated in AA meeting this evening.  

## 2021-04-02 NOTE — Progress Notes (Addendum)
Pt has been alert and oriented to person, place, time and situation. Pt has been calm, and cooperative and pleasant most of the shift. Pt had one episode where pt was tearful and reported passive SI, this was in the afternoon, MD is aware, emotional support was given, and it was effective. Did some nursing intervention, deep breathing techniques taught to pt. PT's mood improved. Pt's mood has been labile at other times. Pt was given PRN medication for c/o anxiety/panic attack which was effective. PT reports she had a sore throat that started last evening, covid test was completed and it was not detected. Pt has been social with peer, went down to dinner. Pt is childlike at times, attention seeking, needs some redirection from hugging peers. Will continue to monitor pt per Q15 minute face checks and monitor for safety and progress. Pt denies SI at this time, denies HI/AVH.

## 2021-04-02 NOTE — Progress Notes (Signed)
Pt stated she was having issues, but pt was seen interacting with peers appearing to be in no acute distress.

## 2021-04-02 NOTE — Progress Notes (Signed)
Adult Psychoeducational Group Note  Date:  04/02/2021 Time:  7:17 PM  Group Topic/Focus:  Healthy Communication:   The focus of this group is to discuss communication, barriers to communication, as well as healthy ways to communicate with others.  Participation Level:  Active  Participation Quality:  Appropriate  Affect:  Appropriate  Cognitive:  Appropriate  Insight: Appropriate  Engagement in Group:  Engaged  Modes of Intervention:  Discussion  Additional Comments:  Pt attended group and participated in discussion.  Rose Hegner R Cailie Bosshart 04/02/2021, 7:17 PM

## 2021-04-02 NOTE — Tx Team (Signed)
Interdisciplinary Treatment and Diagnostic Plan Update  04/02/2021 Time of Session: 9:35am [name] MRN: [id number]  Principal Diagnosis: Bipolar affective disorder, depressed, severe, with psychotic behavior ([place])  Secondary Diagnoses: Principal Problem:   Bipolar affective disorder, depressed, severe, with psychotic behavior ([place]) Active Problems:   PTSD (post-traumatic stress disorder)   Suicidal ideation   Cannabis abuse   Current Medications:  Current Facility-Administered Medications  Medication Dose Route Frequency Provider Last Rate Last Admin   acetaminophen (TYLENOL) tablet 650 mg  650 mg Oral Q6H PRN Rosezetta Schlatter, MD   650 mg at 04/01/21 0810   alum & mag hydroxide-simeth (MAALOX/MYLANTA) 200-200-20 MG/5ML suspension 30 mL  30 mL Oral Q4H PRN Rosezetta Schlatter, MD   30 mL at 03/31/21 0825   clonazePAM (KLONOPIN) tablet 1 mg  1 mg Oral Daily PRN Corky Sox, MD   1 mg at 04/02/21 0813   docusate sodium (COLACE) capsule 100 mg  100 mg Oral Daily Nelda Marseille, Amy E, MD       gabapentin (NEURONTIN) capsule 600 mg  600 mg Oral TID Corky Sox, MD   600 mg at 04/02/21 0810   hydrOXYzine (ATARAX/VISTARIL) tablet 25 mg  25 mg Oral TID PRN Rosezetta Schlatter, MD   25 mg at 04/01/21 2106   magnesium hydroxide (MILK OF MAGNESIA) suspension 30 mL  30 mL Oral Daily PRN Rosezetta Schlatter, MD   30 mL at 03/29/21 2150   methocarbamol (ROBAXIN) tablet 500 mg  500 mg Oral Q8H PRN Harlow Asa, MD   500 mg at 04/01/21 2106   mirtazapine (REMERON) tablet 15 mg  15 mg Oral QHS Harlow Asa, MD   15 mg at 04/01/21 2106   nicotine (NICODERM CQ - dosed in mg/24 hours) patch 21 mg  21 mg Transdermal Daily Sharma Covert, MD   21 mg at 04/02/21 0809   ondansetron (ZOFRAN) tablet 4 mg  4 mg Oral BID PRN Corky Sox, MD   4 mg at 03/30/21 0837   pantoprazole (PROTONIX) EC tablet 40 mg  40 mg Oral Daily Nelda Marseille, Amy E, MD   40 mg at 04/02/21 0957   polyethylene glycol (MIRALAX  / GLYCOLAX) packet 17 g  17 g Oral Daily Nelda Marseille, Amy E, MD       prazosin (MINIPRESS) capsule 2 mg  2 mg Oral QHS Corky Sox, MD   2 mg at 04/01/21 2106   QUEtiapine (SEROQUEL) tablet 25 mg  25 mg Oral BID Corky Sox, MD   25 mg at 04/02/21 [id number]   QUEtiapine (SEROQUEL) tablet 300 mg  300 mg Oral QHS Corky Sox, MD   300 mg at 04/01/21 2106   sucralfate (CARAFATE) 1 GM/10ML suspension 1 g  1 g Oral TID with meals Corky Sox, MD   1 g at 04/02/21 [id number]   SUMAtriptan (IMITREX) tablet 100 mg  100 mg Oral BID PRN Corky Sox, MD       traZODone (DESYREL) tablet 50 mg  50 mg Oral QHS PRN Rosezetta Schlatter, MD   50 mg at 03/31/21 0034   PTA Medications: Medications Prior to Admission  Medication Sig Dispense Refill Last Dose   acetaminophen (TYLENOL) 325 MG tablet Take 650 mg by mouth every 6 (six) hours as needed for mild pain or headache.      buPROPion (WELLBUTRIN XL) 150 MG 24 hr tablet Take 150 mg by mouth daily.      divalproex (DEPAKOTE ER) 500 MG 24 hr tablet Take by mouth  daily.      clonazePAM (KLONOPIN) 1 MG tablet Take 1 mg by mouth 2 (two) times daily.      cyclobenzaprine (FLEXERIL) 10 MG tablet Take 10 mg by mouth 3 (three) times daily as needed for muscle spasms. (Patient not taking: Reported on 03/29/2021)   Not Taking   gabapentin (NEURONTIN) 600 MG tablet Take 600 mg by mouth 3 (three) times daily. (Patient not taking: No sig reported)   Not Taking   HUMIRA PEN 40 MG/0.4ML PNKT Inject 40 mg into the skin every 14 (fourteen) days. (Patient not taking: Reported on 03/29/2021)   Not Taking   hydrOXYzine (VISTARIL) 50 MG capsule Take 50 mg by mouth daily as needed for anxiety.      ibuprofen (ADVIL) 800 MG tablet Take 800 mg by mouth 4 (four) times daily as needed for headache or mild pain.      [EXPIRED] nicotine (NICODERM CQ - DOSED IN MG/24 HOURS) 21 mg/24hr patch Place 1 patch (21 mg total) onto the skin daily for 1 day. 1 patch 0    oxcarbazepine (TRILEPTAL) 600  MG tablet Take 600 mg by mouth 2 (two) times daily. (Patient not taking: Reported on 03/29/2021)   Not Taking   pantoprazole (PROTONIX) 40 MG tablet Take 40 mg by mouth daily.      prazosin (MINIPRESS) 1 MG capsule Take 1 mg by mouth at bedtime. (Patient not taking: Reported on 03/29/2021)   Not Taking   promethazine (PHENERGAN) 25 MG tablet Take 25 mg by mouth every 6 (six) hours as needed for nausea or vomiting. (Patient not taking: Reported on 03/29/2021)   Not Taking   QUEtiapine (SEROQUEL) 25 MG tablet Take 25 mg by mouth 2 (two) times daily.      QUEtiapine (SEROQUEL) 300 MG tablet Take 1 tablet (300 mg total) by mouth at bedtime.      SUMAtriptan (IMITREX) 100 MG tablet Take 100 mg by mouth every 2 (two) hours as needed for migraine. (Patient not taking: Reported on 03/29/2021)   Not Taking   topiramate (TOPAMAX) 100 MG tablet Take 100 mg by mouth at bedtime. (Patient not taking: Reported on 03/29/2021)   Not Taking   zolpidem (AMBIEN) 10 MG tablet Take 10 mg by mouth at bedtime. (Patient not taking: Reported on 03/29/2021)   Not Taking    Patient Stressors: Other: living situation, mental health  Patient Strengths: Average or above average intelligence Capable of independent living Communication skills Motivation for treatment/growth  Treatment Modalities: Medication Management, Group therapy, Case management,  1 to 1 session with clinician, Psychoeducation, Recreational therapy.   Physician Treatment Plan for Primary Diagnosis: Bipolar affective disorder, depressed, severe, with psychotic behavior ([place]) Long Term Goal(s): Improvement in symptoms so as ready for discharge   Short Term Goals: Ability to disclose and discuss suicidal ideas Compliance with prescribed medications will improve  Medication Management: Evaluate patient's response, side effects, and tolerance of medication regimen.  Therapeutic Interventions: 1 to 1 sessions, Unit Group sessions and Medication  administration.  Evaluation of Outcomes: Progressing  Physician Treatment Plan for Secondary Diagnosis: Principal Problem:   Bipolar affective disorder, depressed, severe, with psychotic behavior ([place]) Active Problems:   PTSD (post-traumatic stress disorder)   Suicidal ideation   Cannabis abuse  Long Term Goal(s): Improvement in symptoms so as ready for discharge   Short Term Goals: Ability to disclose and discuss suicidal ideas Compliance with prescribed medications will improve     Medication Management: Evaluate patient's response, side  effects, and tolerance of medication regimen.  Therapeutic Interventions: 1 to 1 sessions, Unit Group sessions and Medication administration.  Evaluation of Outcomes: Progressing   RN Treatment Plan for Primary Diagnosis: Bipolar affective disorder, depressed, severe, with psychotic behavior ([place]) Long Term Goal(s): Knowledge of disease and therapeutic regimen to maintain health will improve  Short Term Goals: Ability to remain free from injury will improve, Ability to verbalize frustration and anger appropriately will improve, Ability to participate in decision making will improve, Ability to verbalize feelings will improve, Ability to identify and develop effective coping behaviors will improve, and Compliance with prescribed medications will improve  Medication Management: RN will administer medications as ordered by provider, will assess and evaluate patient's response and provide education to patient for prescribed medication. RN will report any adverse and/or side effects to prescribing provider.  Therapeutic Interventions: 1 on 1 counseling sessions, Psychoeducation, Medication administration, Evaluate responses to treatment, Monitor vital signs and CBGs as ordered, Perform/monitor CIWA, COWS, AIMS and Fall Risk screenings as ordered, Perform wound care treatments as ordered.  Evaluation of Outcomes: Progressing   LCSW Treatment Plan for  Primary Diagnosis: Bipolar affective disorder, depressed, severe, with psychotic behavior ([place]) Long Term Goal(s): Safe transition to appropriate next level of care at discharge, Engage patient in therapeutic group addressing interpersonal concerns.  Short Term Goals: Engage patient in aftercare planning with referrals and resources, Increase social support, Increase ability to appropriately verbalize feelings, Increase emotional regulation, and Increase skills for wellness and recovery  Therapeutic Interventions: Assess for all discharge needs, 1 to 1 time with Social worker, Explore available resources and support systems, Assess for adequacy in community support network, Educate family and significant other(s) on suicide prevention, Complete Psychosocial Assessment, Interpersonal group therapy.  Evaluation of Outcomes: Progressing   Progress in Treatment: Attending groups: Yes. and No. Participating in groups: No. Taking medication as prescribed: Yes. Toleration medication: Yes. Family/Significant other contact made: Yes, individual(s) contacted:  aunt Patient understands diagnosis: Yes. Discussing patient identified problems/goals with staff: Yes. Medical problems stabilized or resolved: Yes. Denies suicidal/homicidal ideation: Yes. Issues/concerns per patient self-inventory: No.  New problem(s) identified: No, Describe:  none  New Short Term/Long Term Goal(s): detox, medication management for mood stabilization; elimination of SI thoughts; development of comprehensive mental wellness/sobriety plan  Patient Goals:  "To get medicine under control"  Discharge Plan or Barriers: Patient recently admitted. CSW will continue to follow and assess for appropriate referrals and possible discharge planning.    Reason for Continuation of Hospitalization: Anxiety Depression Medication stabilization Suicidal ideation  Estimated Length of Stay: 3-5 days  Attendees: Patient: [name] 04/02/2021   Physician: Fatima Sanger, DO 04/02/2021   Nursing:  04/02/2021  RN Care Manager: 04/02/2021   Social Worker: Darletta Moll, LCSW 04/02/2021   Recreational Therapist:  04/02/2021   Other:  04/02/2021   Other:  04/02/2021   Other: 04/02/2021    Scribe for Treatment Team: Mliss Fritz, Latanya Presser 04/02/2021 12:17 PM

## 2021-04-02 NOTE — Progress Notes (Addendum)
[facility] MD Progress Note  04/02/2021 3:40 PM ERVA JARZABEK  MRN:  [id number] Subjective:   [name] is a 42 YOF with a PPHx of BPAD I, PTSD, GAD, and depression, presenting voluntarily with SI.  The patient's chart was reviewed and nursing notes were reviewed. Over the past 24 hrs, there were no documented behavioral issues, no PRN medications given for agitation.The patient's case was discussed in multidisciplinary team meeting. Nursing reports patient had "volatile" phone call with family members on the phone.   On interview this morning, patient appears similar to previous days. Her thought process is logical but she appears anxious and is tearful at times. She reports feeling worried about her blood pressure since it was elevated this AM. She reports "strong" SI because of how she feels she was treated by nursing last night. She contracts for safety. She denies AVH. She reports nausea and a minor headache as well as sore throat. Covid test ordered.  MD called to assist patient in the early afternoon. Patient is tearful and distraught. She perseverates on being "broken". Patient calmed down with grounding exercises and agreed to take Vistaril.   BP rechecked and within normal limits.   Principal Problem: Bipolar affective disorder, depressed, severe, with psychotic behavior ([place]) Diagnosis: Principal Problem:   Bipolar affective disorder, depressed, severe, with psychotic behavior ([place]) Active Problems:   PTSD (post-traumatic stress disorder)   Suicidal ideation   Cannabis abuse  Total Time Spent in Direct Patient Care: I personally spent 30 minutes on the unit in direct patient care. The direct patient care time included face-to-face time with the patient, reviewing the patient's chart, communicating with other professionals, and coordinating care. Greater than 50% of this time was spent in counseling or coordinating care with the patient regarding goals of hospitalization,  psycho-education, and discharge planning needs.  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   Jaundice    MRSA (methicillin resistant Staphylococcus aureus)    hospitalized for 48 hours in Dec 2016   Staph infection    "in my blood"    Past Surgical History:  Procedure Laterality Date   CHOLECYSTECTOMY  08/02/2012   Procedure: LAPAROSCOPIC CHOLECYSTECTOMY WITH INTRAOPERATIVE CHOLANGIOGRAM;  Surgeon: Rolm Bookbinder, MD;  Location: [place];  Service: General;  Laterality: N/A;   COLONOSCOPY WITH PROPOFOL  04/09/2014   COLONOSCOPY WITH PROPOFOL N/A 07/14/2016   Procedure: COLONOSCOPY WITH PROPOFOL;  Surgeon: Milus Banister, MD;  Location: [facility];  Service: Endoscopy;  Laterality: N/A;   DILATION AND CURETTAGE OF UTERUS     w/ hysteroscopy to remove cyst   ESOPHAGOGASTRODUODENOSCOPY N/A 12/02/2017   Procedure: ESOPHAGOGASTRODUODENOSCOPY (EGD);  Surgeon: Carol Ada, MD;  Location: [facility];  Service: Endoscopy;  Laterality: N/A;   ESOPHAGOGASTRODUODENOSCOPY (EGD) WITH PROPOFOL  04/09/2014   ESOPHAGOGASTRODUODENOSCOPY (EGD) WITH PROPOFOL N/A 07/14/2016   Procedure: ESOPHAGOGASTRODUODENOSCOPY (EGD) WITH PROPOFOL;  Surgeon: Milus Banister, MD;  Location: [facility];  Service: Endoscopy;  Laterality: N/A;   HYSTEROSCOPY WITH D & C  06/22/2012   Procedure: DILATATION  AND CURETTAGE /HYSTEROSCOPY;  Surgeon: Lahoma Crocker, MD;  Location: [facility];  Service: Gynecology;  Laterality: N/A;   KNEE ARTHROSCOPY  2011   left   ORIF TOE FRACTURE Left 11/05/2014   Procedure: OPEN REDUCTION INTERNAL FIXATION (ORIF) LEFT FIFTH METATARSAL (TOE) FRACTURE WITH CALCANEAL BONE GRAFT;  Surgeon: Dorna Leitz, MD;  Location: [place];  Service: Orthopedics;  Laterality: Left;   WISDOM TOOTH EXTRACTION     Family History:  Family History  Problem Relation Age of Onset   Heart disease Father    Stroke Father    Heart attack Father    Skin cancer Father    Diabetes Father    Uterine cancer Mother 90   COPD Mother    Cancer - Colon Maternal Grandfather        mets to bone   Stomach cancer Paternal Grandmother    Cervical cancer Sister        dx between 67-33   Migraines Sister    Prostate cancer Maternal Uncle 74       mets to bone   Ovarian cancer Paternal Aunt    Heart attack Paternal Grandfather    Skin cancer Paternal Aunt    Family Psychiatric  History: aunt with schizophrenia and cousin with BPAD Social History:  Patient lives in a home with her boyfriend, their relationship is as described in the H and P. She reports 6 previous SA and several past hospitalizations. 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      Sleep: Fair  Appetite:  Fair  Current Medications: Current Facility-Administered Medications  Medication Dose Route Frequency Provider Last Rate Last Admin   acetaminophen (TYLENOL) tablet 650 mg  650 mg Oral Q6H PRN Rosezetta Schlatter, MD   650 mg at 04/01/21 0810   alum & mag hydroxide-simeth (MAALOX/MYLANTA) 200-200-20 MG/5ML suspension 30 mL  30 mL Oral Q4H PRN Rosezetta Schlatter, MD   30 mL at 03/31/21 0825   clonazePAM (KLONOPIN) tablet 1 mg  1 mg Oral Daily PRN Corky Sox, MD   1 mg at 04/02/21 0813   docusate sodium (COLACE)  capsule 100 mg  100 mg Oral Daily Nelda Marseille, Justise Ehmann E, MD       gabapentin (NEURONTIN) capsule 600 mg  600 mg Oral TID Corky Sox, MD   600 mg at 04/02/21 1250   hydrOXYzine (ATARAX/VISTARIL) tablet 25 mg  25 mg Oral TID PRN Rosezetta Schlatter, MD   25 mg at 04/02/21 1340   magnesium hydroxide (MILK OF MAGNESIA) suspension 30 mL  30 mL Oral Daily PRN Rosezetta Schlatter, MD   30 mL at 03/29/21 2150   methocarbamol (ROBAXIN) tablet 500 mg  500 mg Oral Q8H PRN Harlow Asa, MD   500 mg at 04/01/21 2106   mirtazapine (REMERON) tablet 15 mg  15 mg Oral QHS Viann Fish E, MD   15 mg at 04/01/21 2106   nicotine (NICODERM CQ - dosed in mg/24 hours) patch 21 mg  21 mg Transdermal Daily Sharma Covert, MD  21 mg at 04/02/21 0809   ondansetron (ZOFRAN) tablet 4 mg  4 mg Oral BID PRN Corky Sox, MD   4 mg at 03/30/21 0837   pantoprazole (PROTONIX) EC tablet 40 mg  40 mg Oral Daily Nelda Marseille, Harmon Bommarito E, MD   40 mg at 04/02/21 0957   polyethylene glycol (MIRALAX / GLYCOLAX) packet 17 g  17 g Oral Daily Nelda Marseille, Scottlynn Lindell E, MD       prazosin (MINIPRESS) capsule 2 mg  2 mg Oral QHS Corky Sox, MD   2 mg at 04/01/21 2106   QUEtiapine (SEROQUEL) tablet 25 mg  25 mg Oral BID Corky Sox, MD   25 mg at 04/02/21 [id number]   QUEtiapine (SEROQUEL) tablet 300 mg  300 mg Oral QHS Corky Sox, MD   300 mg at 04/01/21 2106   sucralfate (CARAFATE) 1 GM/10ML suspension 1 g  1 g Oral TID with meals Corky Sox, MD   1 g at 04/02/21 1250   SUMAtriptan (IMITREX) tablet 100 mg  100 mg Oral BID PRN Corky Sox, MD       traZODone (DESYREL) tablet 50 mg  50 mg Oral QHS PRN Rosezetta Schlatter, MD   50 mg at 03/31/21 0034    Lab Results:  Results for orders placed or performed during the hospital encounter of 03/28/21 (from the past 48 hour(s))  SARS CORONAVIRUS 2 (TAT 6-24 HRS) Nasopharyngeal Nasopharyngeal Swab     Status: None   Collection Time: 04/02/21  8:30 AM   Specimen: Nasopharyngeal Swab  Result Value  Ref Range   SARS Coronavirus 2 NEGATIVE NEGATIVE    Comment: (NOTE) SARS-CoV-2 target nucleic acids are NOT DETECTED.  The SARS-CoV-2 RNA is generally detectable in upper and lower respiratory specimens during the acute phase of infection. Negative results do not preclude SARS-CoV-2 infection, do not rule out co-infections with other pathogens, and should not be used as the sole basis for treatment or other patient management decisions. Negative results must be combined with clinical observations, patient history, and epidemiological information. The expected result is Negative.  Fact Sheet for Patients: SugarRoll.be  Fact Sheet for Healthcare Providers: https://www.woods-mathews.com/  This test is not yet approved or cleared by the [place] FDA and  has been authorized for detection and/or diagnosis of SARS-CoV-2 by FDA under an Emergency Use Authorization (EUA). This EUA will remain  in effect (meaning this test can be used) for the duration of the COVID-19 declaration under Se ction 564(b)(1) of the Act, 21 U.S.C. section 360bbb-3(b)(1), unless the authorization is terminated or revoked sooner.  Performed at [facility], [place] [address], [place], [place] [address]     Blood Alcohol level:  Lab Results  Component Value Date   ETH <10 12/15/2020   ETH <10 [id number]    Metabolic Disorder Labs: Lab Results  Component Value Date   HGBA1C 5.9 (H) 03/28/2021   MPG 122.63 03/28/2021   MPG 122.63 05/14/2020   Lab Results  Component Value Date   PROLACTIN 4.8 05/14/2020   Lab Results  Component Value Date   CHOL 192 03/28/2021   TRIG 105 03/28/2021   HDL 49 03/28/2021   CHOLHDL 3.9 03/28/2021   VLDL 21 03/28/2021   LDLCALC 122 (H) 03/28/2021   LDLCALC 205 (H) 05/14/2020    Physical Findings: AIMS: not yet assessed  Musculoskeletal: Strength & Muscle Tone: not yet assessed Gait & Station: normal Patient  leans: N/A  Psychiatric Specialty Exam: Physical Exam Vitals and nursing note  reviewed.  HENT:     Head: Normocephalic and atraumatic.  Pulmonary:     Effort: Pulmonary effort is normal.  Neurological:     General: No focal deficit present.     Mental Status: She is alert and oriented to person, place, and time.    Review of Systems  HENT:  Positive for sore throat.   Respiratory:  Negative for shortness of breath.   Cardiovascular:  Negative for chest pain.  Gastrointestinal:  Positive for nausea. Negative for vomiting.  Neurological:  Negative for headaches.   Blood pressure 130/90, pulse 97, temperature (!) 97.5 F (36.4 C), temperature source Oral, resp. rate 16, height 5' (1.524 m), weight 81.6 kg, last menstrual period 06/14/2012, SpO2 100 %.Body mass index is 35.15 kg/m.  General Appearance: Casual  Eye Contact:  Fair  Speech:  Clear and Coherent  Volume:  Normal  Mood:  Anxious  Affect:  labile, tearful  Thought Process:  scattered, perseverative on being "broken"  Orientation:  Full (Time, Place, and Person)  Thought Content:  denies AVH, reports significant SI but contracts for safety  Suicidal Thoughts:  yes but contracts for safety  Homicidal Thoughts:  No  Memory:  Immediate;   Good Recent;   Good Remote;   Good  Judgement:  Fair  Insight:  Fair  Psychomotor Activity:  Normal  Concentration:  Concentration: Good  Recall:  Good  Fund of Knowledge:  Fair  Language:  Fair  Akathisia:  No  Handed:  Right  AIMS (if indicated):   NA  Assets:  Housing Physical Health  ADL's:  Intact  Cognition:  as above  Sleep:  Number of Hours: 6   Treatment Plan Summary: Daily contact with patient to assess and evaluate symptoms and progress in treatment and Medication management  Safety and Monitoring -- VOLUNTARY admission to inpatient psychiatric unit for safety, stabilization and treatment -- Daily contact with patient to assess and evaluate symptoms and progress  in treatment -- Patient's case to be discussed in multi-disciplinary team meeting -- Observation Level : q15 minute checks -- Vital signs:  q12 hours -- Precautions: suicide   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 -Supportive psychotherapy provided -Continue Seroquel 25 mg BID and continue 300 mg QHS (restarted 7/31, increased 8/3) Antipsychotic labs             EKG: sinus rhythm with 1st degree AV block (PR 217), pt is asymptomatic, EKG 04/01/21 shows QTC 475m             Lipids: LDL 122, otherwise WNL             A1C: 5.9  CBC 03/30/21 WNL  -Continue Remeron 15 mg for mood/anxiety and sleep and monitor for signs of mood escalation with antidepressant trial -Continue Klonopin 1 mg daily PRN for anxiety -CIWA scoring and PRN Ativan - D/C'd given lack of w/d symptoms and time elapsed -Work with LCSW regarding disposition   Cannabis Use Disorder -Abstinence encouraged -Motivational Interviewing   Tobacco use disorder -as above -NRT   PTSD with nightmares -Continue Prazosin 2 mg QHS for nightmares Monitor BP and orthostasis   Migraine Headaches Topomax 100 mg D/C'd given ineffectiveness Continue Imitrex 100 mg BID PRN   Chronic pain -continue home gabapentin 600 mg TID - Start Robaxin '500mg'$  tid PRN spasm  Hypokalemia - resolved - Repeat BMP 03/30/21 shows K+ 4.5 (remainder of BMP WNL except for creatinine 1.08 which appears baseline compared to previous labs and glucose  112)   Constipation - Continue Miralax daily and colace '100mg'$  daily  GERD -Continue standing Carafate TID -Protonix 40 mg daily  Continue PRN's: Tylenol, Maalox, Atarax, Milk of Magnesia, Trazodone, Zofran  Medical Management Covid negative x2 (last negative on 8/5) CMP: AST of 47, K of 3.1 (replaced) -Ammonia and repeat HFP WNL -Recheck BMP: K of 4.5 CBC: unremarkable -Repeat CBC to check for anemia: WNL EtOH: not collected UDS: MJ, BNZ Upreg: neg TSH: [place] PGY-1,  Psychiatry

## 2021-04-02 NOTE — Progress Notes (Signed)
Patient did not attend morning orientation group.  

## 2021-04-02 NOTE — [facility] Group Notes (Signed)
Due to the acuity, staffing and complex discharge plans, group was not held. Patient was provided therapeutic worksheets and asked to meet with CSW as needed.   Pearlie Lafosse, LCSWA Clinicial Social Worker [facility]  

## 2021-04-03 NOTE — Progress Notes (Signed)
[name] attended evening wrap up group.  Per MHT, she was laughing and participating well during group.  When leaving group she suddenly started crying.  She stated "I just don't know why I have cried all day."  Peers were noted surrounding her with comfort.  After that episode and during 1:1 interaction, she was laughing and jovial.  She reported feeling much better because "you made me laugh" however this was the first time she had met this Probation officer.  She is intrusive and childlike at times with peers.  We discussed the importance of taking medications on a regular basis and she talked about getting pill packs through [facility].  Explained that there are plenty of other options for medication dispensers that she can fill herself if she would so choose.  Information provided on where to find those dispensers.  She denied any pain or discomfort and appeared to be in no physical distress.  She took her bedtime medications without difficulty.  She is currently resting with her eyes closed and appears to be asleep.      04/02/21 2204  Psych Admission Type (Psych Patients Only)  Admission Status Voluntary  Psychosocial Assessment  Patient Complaints Anxiety  Eye Contact Fair  Facial Expression Animated  Affect Appropriate to circumstance  Speech Logical/coherent  Interaction Attention-seeking;Needy  Motor Activity Slow  Appearance/Hygiene Unremarkable  Behavior Characteristics Cooperative;Anxious  Mood Labile;Silly  Thought Process  Coherency WDL  Content WDL  Delusions None reported or observed  Perception WDL  Hallucination None reported or observed  Judgment Impaired  Confusion WDL  Danger to Self  Current suicidal ideation? Denies  Danger to Others  Danger to Others None reported or observed

## 2021-04-03 NOTE — Progress Notes (Signed)
Pt teary eyed in the morning and needed support from MHT who helped pt to process feelings.  Pt says "I feel sad."  Pt has been more upbeat for the afternoon time and is observed laughing with and interacting with other patients.  Pt remains safe on the unit with q 15 min room checks in place.  04/03/21 0830  Psych Admission Type (Psych Patients Only)  Admission Status Voluntary  Psychosocial Assessment  Patient Complaints Anxiety;Crying spells;Sadness  Eye Contact Fair  Facial Expression Animated  Affect Appropriate to circumstance  Speech Logical/coherent  Interaction Attention-seeking;Needy  Motor Activity Slow  Appearance/Hygiene Unremarkable  Behavior Characteristics Cooperative;Anxious  Mood Anxious;Sad  Thought Process  Coherency WDL  Content WDL  Delusions None reported or observed  Perception WDL  Hallucination None reported or observed  Judgment Impaired  Confusion WDL  Danger to Self  Current suicidal ideation? Denies  Danger to Others  Danger to Others None reported or observed

## 2021-04-03 NOTE — Progress Notes (Signed)
Pt visible on the unit this evening , pt educated on the medications, pt encouraged to talk to the doctor about her medications. Pt stated she was concerned that her Bp was elevated , pt encouraged to talk with the doctor. 1:1 time spent talking about pt Tx and pt stated she felt a little better.     04/03/21 2300  Psych Admission Type (Psych Patients Only)  Admission Status Voluntary  Psychosocial Assessment  Patient Complaints Anxiety;Crying spells  Eye Contact Fair  Facial Expression Animated  Affect Appropriate to circumstance  Speech Logical/coherent  Interaction Attention-seeking;Needy  Motor Activity Slow  Appearance/Hygiene Unremarkable  Behavior Characteristics Cooperative  Mood Sad  Thought Process  Coherency WDL  Content WDL  Delusions None reported or observed  Perception WDL  Hallucination None reported or observed  Judgment Impaired  Confusion WDL  Danger to Self  Current suicidal ideation? Denies  Danger to Others  Danger to Others None reported or observed

## 2021-04-03 NOTE — Progress Notes (Signed)
[facility] MD Progress Note  04/03/2021 3:16 PM [name]  MRN:  [id number] Subjective:   [name] is a 39 YOF with a PPHx of BPAD I, PTSD, GAD, and depression, presenting voluntarily with SI.  The patient's chart was reviewed and nursing notes were reviewed. Over the past 24 hrs, there were no documented behavioral issues, no PRN medications given for agitation.The patient's case was discussed in multidisciplinary team meeting.   On interview this morning, patient appears despondent and reports her mood as "sad". She says " I didn't plan on being here at this age" and relates several hopes she had for her life when she was in her twenties. Patient reports that her goal for the day is to work on journaling prompts. She reports that she plans on changing PCPs outside the hospital because "pain management is not a healthy place for a PCP". She reports passive SI but contracts for safety. She denies AVH and ROS is as below.    Principal Problem: Bipolar affective disorder, depressed, severe, with psychotic behavior ([place]) Diagnosis: Principal Problem:   Bipolar affective disorder, depressed, severe, with psychotic behavior ([place]) Active Problems:   PTSD (post-traumatic stress disorder)   Suicidal ideation   Cannabis abuse  Total Time Spent in Direct Patient Care: I personally spent 30 minutes on the unit in direct patient care. The direct patient care time included face-to-face time with the patient, reviewing the patient's chart, communicating with other professionals, and coordinating care. Greater than 50% of this time was spent in counseling or coordinating care with the patient regarding goals of hospitalization, psycho-education, and discharge planning needs.  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   Jaundice    MRSA (methicillin resistant Staphylococcus aureus)    hospitalized for 48 hours in Dec 2016   Staph infection    "in my blood"    Past Surgical History:  Procedure Laterality Date   CHOLECYSTECTOMY  08/02/2012   Procedure: LAPAROSCOPIC CHOLECYSTECTOMY WITH INTRAOPERATIVE CHOLANGIOGRAM;  Surgeon: Rolm Bookbinder, MD;  Location: [facility];  Service: General;  Laterality: N/A;   COLONOSCOPY WITH PROPOFOL  04/09/2014   COLONOSCOPY WITH PROPOFOL N/A 07/14/2016   Procedure: COLONOSCOPY WITH PROPOFOL;  Surgeon: Milus Banister, MD;  Location: [facility];  Service: Endoscopy;  Laterality: N/A;   DILATION AND CURETTAGE OF UTERUS     w/ hysteroscopy to remove cyst   ESOPHAGOGASTRODUODENOSCOPY N/A 12/02/2017   Procedure: ESOPHAGOGASTRODUODENOSCOPY (EGD);  Surgeon: Carol Ada, MD;  Location: [facility];  Service: Endoscopy;  Laterality: N/A;   ESOPHAGOGASTRODUODENOSCOPY (EGD) WITH PROPOFOL  04/09/2014   ESOPHAGOGASTRODUODENOSCOPY (EGD) WITH PROPOFOL N/A 07/14/2016   Procedure: ESOPHAGOGASTRODUODENOSCOPY (EGD) WITH PROPOFOL;  Surgeon:  Milus Banister, MD;  Location: [facility];  Service: Endoscopy;  Laterality: N/A;   HYSTEROSCOPY WITH D & C  06/22/2012   Procedure: DILATATION AND CURETTAGE /HYSTEROSCOPY;  Surgeon: Lahoma Crocker, MD;  Location: [facility];  Service: Gynecology;  Laterality: N/A;   KNEE ARTHROSCOPY  2011   left   ORIF TOE FRACTURE Left 11/05/2014   Procedure: OPEN REDUCTION INTERNAL FIXATION (ORIF) LEFT FIFTH METATARSAL (TOE) FRACTURE  WITH CALCANEAL BONE GRAFT;  Surgeon: Dorna Leitz, MD;  Location: [facility];  Service: Orthopedics;  Laterality: Left;   WISDOM TOOTH EXTRACTION     Family History:  Family History  Problem Relation Age of Onset   Heart disease Father    Stroke Father    Heart attack Father    Skin cancer Father    Diabetes Father    Uterine cancer Mother 42   COPD Mother    Cancer - Colon Maternal Grandfather        mets to bone   Stomach cancer Paternal Grandmother    Cervical cancer Sister        dx between 11-33   Migraines Sister    Prostate cancer Maternal Uncle 84       mets to bone   Ovarian cancer Paternal Aunt    Heart attack Paternal Grandfather    Skin cancer Paternal Aunt    Family Psychiatric  History: aunt with schizophrenia and cousin with BPAD Social History:  Patient lives in a home with her boyfriend, their relationship is as described in the H and P. She reports 6 previous SA and several past hospitalizations. 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      Sleep: Fair  Appetite:  Fair  Current Medications: Current Facility-Administered Medications  Medication Dose Route Frequency Provider Last Rate Last Admin   acetaminophen (TYLENOL) tablet 650 mg  650 mg Oral Q6H PRN Rosezetta Schlatter, MD   650 mg at 04/01/21 0810   alum & mag hydroxide-simeth (MAALOX/MYLANTA) 200-200-20 MG/5ML suspension 30 mL  30 mL Oral Q4H PRN Rosezetta Schlatter, MD   30 mL at 03/31/21 0825   clonazePAM (KLONOPIN) tablet 1 mg  1 mg Oral Daily PRN Corky Sox, MD   1 mg at 04/02/21 0813   docusate sodium (COLACE) capsule 100 mg  100 mg Oral Daily Nelda Marseille, Amy E, MD       gabapentin (NEURONTIN) capsule 600 mg  600 mg Oral TID Corky Sox, MD   600 mg at 04/03/21 1106   hydrOXYzine (ATARAX/VISTARIL) tablet 25 mg  25 mg Oral TID PRN Rosezetta Schlatter, MD   25 mg at 04/03/21 0834   magnesium  hydroxide (MILK OF MAGNESIA) suspension 30 mL  30 mL Oral Daily PRN Rosezetta Schlatter, MD   30 mL at 03/29/21 2150   methocarbamol (ROBAXIN) tablet 500 mg  500 mg Oral Q8H PRN Harlow Asa, MD   500 mg at 04/03/21 0834   mirtazapine (REMERON) tablet 15 mg  15 mg Oral QHS Nelda Marseille, Amy E, MD   15 mg at 04/02/21 2204   nicotine (NICODERM CQ - dosed in mg/24 hours) patch 21 mg  21 mg Transdermal Daily Sharma Covert, MD   21 mg at 04/03/21 0836   ondansetron (ZOFRAN) tablet 4 mg  4 mg Oral BID PRN Corky Sox, MD   4 mg at 04/03/21 0834   pantoprazole (PROTONIX) EC  tablet 40 mg  40 mg Oral Daily Nelda Marseille, Amy E, MD   40 mg at 04/03/21 0834   polyethylene glycol (MIRALAX / GLYCOLAX) packet 17 g  17 g Oral Daily Nelda Marseille, Amy E, MD       prazosin (MINIPRESS) capsule 2 mg  2 mg Oral QHS Corky Sox, MD   2 mg at 04/02/21 2204   QUEtiapine (SEROQUEL) tablet 25 mg  25 mg Oral BID Corky Sox, MD   25 mg at 04/03/21 [id number]   QUEtiapine (SEROQUEL) tablet 300 mg  300 mg Oral QHS Corky Sox, MD   300 mg at 04/02/21 2204   sucralfate (CARAFATE) 1 GM/10ML suspension 1 g  1 g Oral TID with meals Corky Sox, MD   1 g at 04/02/21 1250   SUMAtriptan (IMITREX) tablet 100 mg  100 mg Oral BID PRN Corky Sox, MD       traZODone (DESYREL) tablet 50 mg  50 mg Oral QHS PRN Rosezetta Schlatter, MD   50 mg at 03/31/21 0034    Lab Results:  Results for orders placed or performed during the hospital encounter of 03/28/21 (from the past 48 hour(s))  SARS CORONAVIRUS 2 (TAT 6-24 HRS) Nasopharyngeal Nasopharyngeal Swab     Status: None   Collection Time: 04/02/21  8:30 AM   Specimen: Nasopharyngeal Swab  Result Value Ref Range   SARS Coronavirus 2 NEGATIVE NEGATIVE    Comment: (NOTE) SARS-CoV-2 target nucleic acids are NOT DETECTED.  The SARS-CoV-2 RNA is generally detectable in upper and lower respiratory specimens during the acute phase of infection. Negative results do not preclude  SARS-CoV-2 infection, do not rule out co-infections with other pathogens, and should not be used as the sole basis for treatment or other patient management decisions. Negative results must be combined with clinical observations, patient history, and epidemiological information. The expected result is Negative.  Fact Sheet for Patients: SugarRoll.be  Fact Sheet for Healthcare Providers: https://www.woods-mathews.com/  This test is not yet approved or cleared by the [place] FDA and  has been authorized for detection and/or diagnosis of SARS-CoV-2 by FDA under an Emergency Use Authorization (EUA). This EUA will remain  in effect (meaning this test can be used) for the duration of the COVID-19 declaration under Se ction 564(b)(1) of the Act, 21 U.S.C. section 360bbb-3(b)(1), unless the authorization is terminated or revoked sooner.  Performed at [facility], [place] [address], [place], [place] [address]     Blood Alcohol level:  Lab Results  Component Value Date   ETH <10 12/15/2020   ETH <10 [id number]    Metabolic Disorder Labs: Lab Results  Component Value Date   HGBA1C 5.9 (H) 03/28/2021   MPG 122.63 03/28/2021   MPG 122.63 05/14/2020   Lab Results  Component Value Date   PROLACTIN 4.8 05/14/2020   Lab Results  Component Value Date   CHOL 192 03/28/2021   TRIG 105 03/28/2021   HDL 49 03/28/2021   CHOLHDL 3.9 03/28/2021   VLDL 21 03/28/2021   LDLCALC 122 (H) 03/28/2021   LDLCALC 205 (H) 05/14/2020    Physical Findings: AIMS: not yet assessed  Musculoskeletal: Strength & Muscle Tone: not yet assessed Gait & Station: normal Patient leans: N/A  Psychiatric Specialty Exam: Physical Exam Vitals and nursing note reviewed.  HENT:     Head: Normocephalic and atraumatic.  Pulmonary:     Effort: Pulmonary effort is normal.  Neurological:     General: No focal deficit present.  Mental Status: She is  alert and oriented to person, place, and time.    Review of Systems  Respiratory:  Negative for shortness of breath.   Cardiovascular:  Negative for chest pain.  Gastrointestinal:  Negative for nausea and vomiting.  Neurological:  Negative for headaches.   Blood pressure (!) 130/93, pulse 97, temperature 97.7 F (36.5 C), temperature source Oral, resp. rate 16, height 5' (1.524 m), weight 81.6 kg, last menstrual period 06/14/2012, SpO2 100 %.Body mass index is 35.15 kg/m.  General Appearance: Casual  Eye Contact:  Fair  Speech:  Clear and Coherent  Volume:  Normal  Mood:  Anxious, "sad"  Affect:  labile, tearful  Thought Process:  scattered, perseverative on being "broken"  Orientation:  Full (Time, Place, and Person)  Thought Content:  denies AVH, reports SI but contracts for safety  Suicidal Thoughts:  yes but contracts for safety  Homicidal Thoughts:  No  Memory:  Immediate;   Good Recent;   Good Remote;   Good  Judgement:  Fair  Insight:  Fair  Psychomotor Activity:  Normal  Concentration:  Concentration: Good  Recall:  Good  Fund of Knowledge:  Fair  Language:  Fair  Akathisia:  No  Handed:  Right  AIMS (if indicated):   NA  Assets:  Housing Physical Health  ADL's:  Intact  Cognition:  as above  Sleep:  Number of Hours: 6.75   Treatment Plan Summary: Daily contact with patient to assess and evaluate symptoms and progress in treatment and Medication management  Safety and Monitoring -- VOLUNTARY admission to inpatient psychiatric unit for safety, stabilization and treatment -- Daily contact with patient to assess and evaluate symptoms and progress in treatment -- Patient's case to be discussed in multi-disciplinary team meeting -- Observation Level : q15 minute checks -- Vital signs:  q12 hours -- Precautions: suicide   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 -Supportive psychotherapy provided -Continue Seroquel 25 mg BID and continue 300 mg QHS  (restarted 7/31, increased 8/3) Antipsychotic labs             EKG: sinus rhythm with 1st degree AV block (PR 217), pt is asymptomatic, EKG 04/01/21 shows QTC 43m             Lipids: LDL 122, otherwise WNL             A1C: 5.9  CBC 03/30/21 WNL  -Continue Remeron 15 mg for mood/anxiety and sleep and monitor for signs of mood escalation with antidepressant trial -Continue Klonopin 1 mg daily PRN for anxiety -CIWA scoring and PRN Ativan - D/C'd given lack of w/d symptoms and time elapsed -Work with LCSW regarding disposition   Cannabis Use Disorder -Abstinence encouraged -Motivational Interviewing   Tobacco use disorder -as above -NRT   PTSD with nightmares -Continue Prazosin 2 mg QHS for nightmares Monitor BP and orthostasis   Migraine Headaches Topomax 100 mg D/C'd given ineffectiveness Continue Imitrex 100 mg BID PRN   Chronic pain -continue home gabapentin 600 mg TID - Start Robaxin '500mg'$  tid PRN spasm  Hypokalemia - resolved - Repeat BMP 03/30/21 shows K+ 4.5 (remainder of BMP WNL except for creatinine 1.08 which appears baseline compared to previous labs and glucose 112)   Constipation - Continue Miralax daily and colace '100mg'$  daily  GERD -Continue standing Carafate TID -Protonix 40 mg daily  Continue PRN's: Tylenol, Maalox, Atarax, Milk of Magnesia, Trazodone, Zofran  Medical Management Covid negative x2 (last negative on 8/5) CMP: AST  of 47, K of 3.1 (replaced) -Ammonia and repeat HFP WNL -Recheck BMP: K of 4.5 CBC: unremarkable -Repeat CBC to check for anemia: WNL EtOH: not collected UDS: MJ, BNZ Upreg: neg TSH: [facility] PGY-1, Psychiatry

## 2021-04-03 NOTE — Progress Notes (Signed)
Adult Psychoeducational Group Note  Date:  04/03/2021 Time:  11:18 AM  Group Topic/Focus:  Goals Group:   The focus of this group is to help patients establish daily goals to achieve during treatment and discuss how the patient can incorporate goal setting into their daily lives to aide in recovery.  Participation Level:  Active  Participation Quality:  Appropriate  Affect:  Appropriate  Cognitive:  Appropriate  Insight: Appropriate  Engagement in Group:  Engaged  Modes of Intervention:  Discussion  Additional Comments:  Pt attended group and participated in discussion.  Treesa Mccully R Greyden Besecker 04/03/2021, 11:18 AM

## 2021-04-04 NOTE — [facility] Group Notes (Signed)
[facility] Group Notes:  (Nursing/MHT/Case Management/Adjunct)  Date:  04/04/2021  Time:  10:57 AM  Type of Therapy:  The focus of this group is to help patients establish daily goals to achieve during treatment and discuss how the patient can incorporate goal setting into their daily lives to aide in recovery.     Psychoeducational Skills  Participation Level:  Active  Participation Quality:  Appropriate, Attentive, and Sharing  Affect:  Anxious and Depressed  Cognitive:  Alert, Appropriate, and Oriented  Insight:  Appropriate and Improving  Engagement in Group:  Developing/Improving, Engaged, and Improving  Modes of Intervention:  Discussion, Education, Rapport Building, and Socialization  Summary of Progress/Problems: Goal for today: "I want to feel better and be happy."  Maudie Flakes 04/04/2021, 10:57 AM

## 2021-04-04 NOTE — Progress Notes (Signed)
Pt continues to verbalize somatic complaints. Pt is fixated on a skin rash that when showed to this writer was not apparent. Pt complied with scheduled medications. Pt currently denies SI/ HI/AVH and pain to this writer during the shift. Pt interacts appropriately with others in the milieu. Pt verbally contracted for safety.   A: Support and encouragement was offered and accepted. Pt medications were administered per order. Safety rounds continued and maintained Q 15 throughout the shift.     R: Discuss medication concerns with provider. Safety maintained. Will continue to monitor and assess.    04/04/21 2300  Psych Admission Type (Psych Patients Only)  Admission Status Voluntary  Psychosocial Assessment  Patient Complaints Anxiety  Eye Contact Fair  Facial Expression Anxious  Affect Anxious  Speech Logical/coherent  Interaction Assertive;Attention-seeking;Needy  Motor Activity Slow  Appearance/Hygiene Unremarkable  Behavior Characteristics Appropriate to situation  Mood Anxious  Thought Process  Coherency Concrete thinking  Content WDL  Delusions Somatic  Perception WDL  Hallucination None reported or observed  Judgment Limited  Confusion None  Danger to Self  Current suicidal ideation? Denies  Self-Injurious Behavior No self-injurious ideation or behavior indicators observed or expressed   Agreement Not to Harm Self Yes  Description of Agreement verbal  Danger to Others  Danger to Others None reported or observed
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
## 2021-04-04 NOTE — Progress Notes (Signed)
Pt continues to have somatic complaints. Pt c/o skin rash but not apparent tot this Probation officer.Pt complied with scheduled medications. Pt currently denies SI/ HI/AVH and pain to this writer during the shift. Pt interacts appropriately with others in the milieu. Pt verbally contracted for safety.   A: Support and encouragement was offered and accepted. Pt medications were administered per order. Safety rounds continued and maintained Q 15 throughout the shift.     R: Pt was encouraged to discuss concerns with provider. Safety maintained. Will continue to monitor and assess.    04/04/21 2300  Psych Admission Type (Psych Patients Only)  Admission Status Voluntary  Psychosocial Assessment  Patient Complaints Anxiety  Eye Contact Fair  Facial Expression Anxious  Affect Anxious  Speech Logical/coherent  Interaction Assertive;Attention-seeking;Needy  Motor Activity Slow  Appearance/Hygiene Unremarkable  Behavior Characteristics Appropriate to situation  Mood Anxious  Thought Process  Coherency Concrete thinking  Content WDL  Delusions Somatic  Perception WDL  Hallucination None reported or observed  Judgment Limited  Confusion None  Danger to Self  Current suicidal ideation? Denies  Self-Injurious Behavior No self-injurious ideation or behavior indicators observed or expressed   Agreement Not to Harm Self Yes  Description of Agreement verbal  Danger to Others  Danger to Others None reported or observed

## 2021-04-04 NOTE — Plan of Care (Signed)
  Problem: Education: Goal: Ability to state activities that reduce stress will improve Outcome: Progressing   Problem: Coping: Goal: Ability to identify and develop effective coping behavior will improve Outcome: Progressing   Problem: Self-Concept: Goal: Ability to identify factors that promote anxiety will improve Outcome: Progressing

## 2021-04-04 NOTE — Progress Notes (Addendum)
[facility] MD Progress Note  04/04/2021 11:30 AM [name]  MRN:  [id number]  Subjective:  "I feel so much better today. I am excited to be discharged tomorrow"   Objective: [name] is a 48 YOF with a PPHx of BPAD I, PTSD, GAD, and depression, presenting voluntarily with SI.  The patient's chart was reviewed and nursing notes were reviewed. Over the past 24 hrs, there were no documented behavioral issues, no PRN medications given for agitation.The patient's case was discussed in multidisciplinary team meeting.   Evaluation on the unit today: Patient stated she is feeling so much better and slept very well last night. She stated she did not wake up even once. Record reflects she slept 6.5 hours last night. She denies SI/HI/AVH, paranoia and delusions. She is not tearful today. She stated she feels ready to go home tomorrow and face whatever she has to. She feels she needs to change PCP's because the current one keeps trying to push pain medication on her and she feels she does not want to take all of that medication. She is attending group therapy and interacting appropriately with staff and peers. She is taking her medications and has no issues with them. She is excited to be discharged tomorrow and hopes to leave around noon. Vital signs are stable: BP 101/69, pulse 92. She is afebrile. No new labs or medication changes today. Will continue to monitor.   Principal Problem: Bipolar affective disorder, depressed, severe, with psychotic behavior ([place]) Diagnosis: Principal Problem:   Bipolar affective disorder, depressed, severe, with psychotic behavior ([place]) Active Problems:   PTSD (post-traumatic stress disorder)   Suicidal ideation   Cannabis abuse  Total Time Spent in Direct Patient Care: 15 minutes  Past Psychiatric History:  BPAD I, PTSD, GAD, and depression  Past Medical History:  Past Medical History:  Diagnosis Date   Anxiety    Arthritis    left knee   Astigmatism    both  eyes   Cancer ([place])    melanoma removed from back    Cataract    left eye   Depression    Diverticulosis    Erosive esophagitis    Family history of ovarian cancer    Family history of prostate cancer    Family history of uterine cancer    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   Jaundice    MRSA (methicillin resistant Staphylococcus aureus)    hospitalized for 48 hours in Dec 2016   Staph infection    "in my blood"    Past Surgical History:  Procedure Laterality Date   CHOLECYSTECTOMY  08/02/2012   Procedure: LAPAROSCOPIC CHOLECYSTECTOMY WITH INTRAOPERATIVE CHOLANGIOGRAM;  Surgeon: Rolm Bookbinder, MD;  Location: [place];  Service: General;  Laterality: N/A;   COLONOSCOPY WITH PROPOFOL  04/09/2014   COLONOSCOPY WITH PROPOFOL N/A 07/14/2016   Procedure: COLONOSCOPY WITH PROPOFOL;  Surgeon: Milus Banister, MD;  Location: [facility];  Service: Endoscopy;  Laterality: N/A;   DILATION AND CURETTAGE OF UTERUS     w/ hysteroscopy to remove cyst   ESOPHAGOGASTRODUODENOSCOPY N/A 12/02/2017   Procedure: ESOPHAGOGASTRODUODENOSCOPY (EGD);  Surgeon: Carol Ada, MD;  Location: [facility];  Service: Endoscopy;  Laterality: N/A;   ESOPHAGOGASTRODUODENOSCOPY (EGD) WITH PROPOFOL  04/09/2014   ESOPHAGOGASTRODUODENOSCOPY (EGD) WITH PROPOFOL N/A 07/14/2016   Procedure: ESOPHAGOGASTRODUODENOSCOPY (EGD) WITH PROPOFOL;  Surgeon: Milus Banister, MD;  Location: [facility];  Service: Endoscopy;  Laterality: N/A;   HYSTEROSCOPY WITH D & C  06/22/2012   Procedure: DILATATION AND CURETTAGE /HYSTEROSCOPY;  Surgeon: Lahoma Crocker, MD;  Location: [facility];  Service: Gynecology;  Laterality: N/A;   KNEE ARTHROSCOPY  2011   left   ORIF TOE FRACTURE Left 11/05/2014   Procedure: OPEN REDUCTION INTERNAL FIXATION  (ORIF) LEFT FIFTH METATARSAL (TOE) FRACTURE WITH CALCANEAL BONE GRAFT;  Surgeon: Dorna Leitz, MD;  Location: [facility];  Service: Orthopedics;  Laterality: Left;   WISDOM TOOTH EXTRACTION     Family History:  Family History  Problem Relation Age of Onset   Heart disease Father    Stroke Father    Heart attack Father    Skin cancer Father    Diabetes Father    Uterine cancer Mother 64   COPD Mother    Cancer - Colon Maternal Grandfather        mets to bone   Stomach cancer Paternal Grandmother    Cervical cancer Sister        dx between 62-33   Migraines Sister    Prostate cancer Maternal Uncle 2       mets to bone   Ovarian cancer Paternal Aunt    Heart attack Paternal Grandfather    Skin cancer Paternal Aunt    Family Psychiatric  History: aunt with schizophrenia and cousin with BPAD Social History:  Patient lives in a home with her boyfriend, their relationship is as described in the H and P. She reports 6 previous SA and several past hospitalizations. 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      Sleep: Fair  Appetite:  Fair  Current Medications: Current Facility-Administered Medications  Medication Dose Route Frequency Provider Last Rate Last Admin   acetaminophen (TYLENOL) tablet 650 mg  650 mg Oral Q6H PRN Rosezetta Schlatter, MD   650 mg at 04/01/21 0810   alum & mag hydroxide-simeth (MAALOX/MYLANTA) 200-200-20 MG/5ML suspension 30 mL  30 mL Oral Q4H PRN Rosezetta Schlatter, MD   30 mL at 04/03/21 2202   clonazePAM (KLONOPIN) tablet 1 mg  1 mg Oral Daily PRN Corky Sox, MD   1 mg at 04/03/21 1814   docusate sodium (COLACE) capsule 100 mg  100 mg Oral Daily Nelda Marseille, Rozell Kettlewell E, MD       gabapentin (NEURONTIN) capsule 600 mg  600 mg Oral TID Corky Sox, MD   600 mg at 04/04/21 0753   hydrOXYzine (ATARAX/VISTARIL) tablet 25 mg  25 mg Oral TID PRN Rosezetta Schlatter, MD   25 mg at 04/04/21 0755   magnesium hydroxide (MILK OF MAGNESIA) suspension 30 mL  30 mL Oral Daily PRN Rosezetta Schlatter, MD   30 mL at 03/29/21 2150   methocarbamol (ROBAXIN) tablet 500 mg  500 mg Oral Q8H PRN Harlow Asa, MD   500 mg at 04/04/21 0756   mirtazapine (REMERON) tablet 15 mg  15 mg Oral QHS Nelda Marseille, Kahner Yanik E, MD   15 mg at 04/03/21 2144   nicotine (NICODERM CQ - dosed in mg/24 hours) patch 21 mg  21 mg Transdermal Daily Sharma Covert, MD   21 mg at 04/04/21 0754   ondansetron (ZOFRAN) tablet 4 mg  4 mg  Oral BID PRN Corky Sox, MD   4 mg at 04/03/21 0834   pantoprazole (PROTONIX) EC tablet 40 mg  40 mg Oral Daily Nelda Marseille, Kyanna Mahrt E, MD   40 mg at 04/04/21 0754   polyethylene glycol (MIRALAX / GLYCOLAX) packet 17 g  17 g Oral Daily Nelda Marseille, Kawika Bischoff E, MD       prazosin (MINIPRESS) capsule 2 mg  2 mg Oral QHS Corky Sox, MD   2 mg at 04/03/21 2144   QUEtiapine (SEROQUEL) tablet 25 mg  25 mg Oral BID Corky Sox, MD   25 mg at 04/04/21 0754   QUEtiapine (SEROQUEL) tablet 300 mg  300 mg Oral QHS Corky Sox, MD   300 mg at 04/03/21 2144   sucralfate (CARAFATE) 1 GM/10ML suspension 1 g  1 g Oral TID with meals Corky Sox, MD   1 g at 04/04/21 0754   SUMAtriptan (IMITREX) tablet 100 mg  100 mg Oral BID PRN Corky Sox, MD       traZODone (DESYREL) tablet 50 mg  50 mg Oral QHS PRN Rosezetta Schlatter, MD   50 mg at 03/31/21 0034    Lab Results:  No results found for this or any previous visit (from the past 31 hour(s)).   Blood Alcohol level:  Lab Results  Component Value Date   ETH <10 12/15/2020   ETH <10 [id number]    Metabolic Disorder Labs: Lab Results  Component Value Date   HGBA1C 5.9 (H) 03/28/2021   MPG 122.63 03/28/2021   MPG 122.63 05/14/2020   Lab Results  Component Value Date   PROLACTIN 4.8 05/14/2020   Lab Results  Component Value Date   CHOL 192 03/28/2021   TRIG 105 03/28/2021   HDL 49 03/28/2021   CHOLHDL 3.9  03/28/2021   VLDL 21 03/28/2021   LDLCALC 122 (H) 03/28/2021   LDLCALC 205 (H) 05/14/2020    Physical Findings: AIMS: not yet assessed  Musculoskeletal: Strength & Muscle Tone: not yet assessed Gait & Station: normal Patient leans: N/A  Psychiatric Specialty Exam: Physical Exam Vitals and nursing note reviewed.  Constitutional:      Appearance: Normal appearance.  HENT:     Head: Normocephalic and atraumatic.  Pulmonary:     Effort: Pulmonary effort is normal.  Musculoskeletal:        General: Normal range of motion.     Cervical back: Normal range of motion.  Neurological:     General: No focal deficit present.     Mental Status: She is alert and oriented to person, place, and time.  Psychiatric:        Attention and Perception: Attention normal. She does not perceive auditory or visual hallucinations.        Mood and Affect: Mood normal.        Speech: Speech normal.        Behavior: Behavior normal. Behavior is cooperative.        Thought Content: Thought content normal. Thought content is not paranoid or delusional. Thought content does not include homicidal or suicidal ideation. Thought content does not include homicidal or suicidal plan.        Cognition and Memory: Cognition normal.    Review of Systems  Constitutional: Negative.  Negative for fever.  HENT: Negative.  Negative for congestion, sinus pain and sore throat.   Respiratory: Negative.  Negative for shortness of breath.   Cardiovascular: Negative.  Negative for chest pain.  Gastrointestinal: Negative.  Negative for nausea and  vomiting.  Neurological:  Negative for headaches.   Blood pressure 101/69, pulse 92, temperature 97.9 F (36.6 C), temperature source Oral, resp. rate 16, height 5' (1.524 m), weight 81.6 kg, last menstrual period 06/14/2012, SpO2 100 %.Body mass index is 35.15 kg/m.  General Appearance: Casual  Eye Contact:  Fair  Speech:  Clear and Coherent  Volume:  Normal  Mood: described as  "good"   Affect:  brighter, calm, stable  Thought Process:  linear, goal directed  Orientation:  Full (Time, Place, and Person)  Thought Content:  denies AVH and no evidence of acute psychosis or delusions on exam  Suicidal Thoughts:  No  Homicidal Thoughts:  No  Memory:  Immediate;   Good Recent;   Good Remote;   Good  Judgement:  Fair  Insight:  Fair  Psychomotor Activity:  Normal  Concentration:  Concentration: Good  Recall:  Good  Fund of Knowledge:  Fair  Language:  Fair  Akathisia:  No  Handed:  Right  AIMS (if indicated):   NA  Assets:  Housing Physical Health  ADL's:  Intact  Cognition:  as above  Sleep:  Number of Hours: 6.5   Treatment Plan Summary: Daily contact with patient to assess and evaluate symptoms and progress in treatment and Medication management  Safety and Monitoring -- VOLUNTARY admission to inpatient psychiatric unit for safety, stabilization and treatment -- Daily contact with patient to assess and evaluate symptoms and progress in treatment -- Patient's case to be discussed in multi-disciplinary team meeting -- Observation Level : q15 minute checks -- Vital signs:  q12 hours -- Precautions: suicide   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 -Supportive psychotherapy provided -Continue Seroquel 25 mg BID and continue 300 mg QHS (restarted 7/31, increased 8/3) Antipsychotic labs             EKG: sinus rhythm with 1st degree AV block (PR 217), pt is asymptomatic, EKG 04/01/21 shows QTC 445m             Lipids: LDL 122, otherwise WNL             A1C: 5.9  CBC 03/30/21 WNL  -Continue Remeron 15 mg for mood/anxiety and sleep and monitor for signs of mood escalation with antidepressant trial -Continue Klonopin 1 mg daily PRN for anxiety -CIWA scoring and PRN Ativan - D/C'd given lack of w/d symptoms and time elapsed -Work with LCSW regarding disposition   Cannabis Use Disorder -Abstinence encouraged -Motivational Interviewing   Tobacco  use disorder -as above -NRT   PTSD with nightmares -Continue Prazosin 2 mg QHS for nightmares Monitor BP and orthostasis   Migraine Headaches Topomax 100 mg D/C'd given ineffectiveness Continue Imitrex 100 mg BID PRN   Chronic pain -Continue home gabapentin 600 mg TID - Continue Robaxin '500mg'$  tid PRN spasm  Hypokalemia - resolved - Repeat BMP 03/30/21 shows K+ 4.5 (remainder of BMP WNL except for creatinine 1.08 which appears baseline compared to previous labs and glucose 112)   Constipation - Continue Miralax daily and colace '100mg'$  daily  GERD -Continue standing Carafate TID -Protonix 40 mg daily  Continue PRN's: Tylenol, Maalox, Atarax, Milk of Magnesia, Trazodone, Zofran  Medical Management Covid negative x2 (last negative on 8/5) CMP: AST of 47, K of 3.1 (replaced) -Ammonia and repeat HFP WNL -Recheck BMP: K of 4.5 CBC: unremarkable -Repeat CBC to check for anemia: WNL EtOH: not collected UDS: MJ, BNZ Upreg: neg TSH: 3   LEthelene Hal FNP-C, PMHNP-BC  04/04/2021 4:31 PM

## 2021-04-04 NOTE — Progress Notes (Signed)
Progress note  Pt continues to express feelings of sadness. Pt seems anxious with discharge. Pt still needy at times. Pt denies si/hi/ah/vh and verbally agrees to approach staff if these become apparent or before harming themselves/others while at [place].  A: Pt provided support and encouragement. Pt given medication per protocol and standing orders. Q8msafety checks implemented and continued.  R: Pt safe on the unit. Will continue to monitor.    04/04/21 1125  Psych Admission Type (Psych Patients Only)  Admission Status Voluntary  Psychosocial Assessment  Patient Complaints Sadness  Eye Contact Fair  Facial Expression Sullen;Sad  Affect Sad;Sullen  Speech Logical/coherent  Interaction Assertive;Attention-seeking;Needy  Motor Activity Slow  Appearance/Hygiene Unremarkable  Behavior Characteristics Cooperative;Appropriate to situation;Anxious  Mood Depressed;Sad;Sullen;Pleasant  Thought Process  Coherency Concrete thinking  Content WDL  Delusions Somatic  Perception WDL  Hallucination None reported or observed  Judgment Poor  Confusion None  Danger to Self  Current suicidal ideation? Denies  Self-Injurious Behavior No self-injurious ideation or behavior indicators observed or expressed   Danger to Others  Danger to Others None reported or observed

## 2021-04-04 NOTE — Progress Notes (Signed)
Adult Psychoeducational Group Note  Date:  04/04/2021 Time:  9:52 PM  Group Topic/Focus:  Wrap-Up Group:   The focus of this group is to help patients review their daily goal of treatment and discuss progress on daily workbooks.  Participation Level:  Active  Participation Quality:  Appropriate  Affect:  Appropriate  Cognitive:  Appropriate  Insight: Appropriate  Engagement in Group:  Engaged  Modes of Intervention:  Discussion  Additional Comments:  Patient attend wrap up group  [name] 04/04/2021, 9:52 PM

## 2021-04-04 NOTE — BHH Group Notes (Signed)
[facility] LCSW Group Therapy Note  04/04/2021    Type of Therapy and Topic:  Group Therapy:  Songs to [employer] Actions at Discharge  Participation Level:  Active   Description of Group:   In this group, two songs were played to re-emphasize group on fear from yesterday:  Dear Insecurity and [employer].  Patients were given the opportunity to share their feelings and thoughts.  Patients were then introduced to the concept that additional supports including self-support are an essential part of recovery.  A song (My Own Hero) was played and a group discussion ensued in which patients stated the song inspired them to realize they have be willing to help themselves in order to succeed, because other people cannot achieve sobriety or stability for them.  Patients were encouraged toward self-advocacy and self-support as part of their recovery, including the establishment of appropriate boundaries in various relationships.  They also identified at least one support they need to add in order to achieve their goals at discharge.   Therapeutic Goals: 1)  explain the difference between healthy and unhealthy supports  2)  demonstrate the importance of being a key part of one's own support system 3)  discuss the need for appropriate boundaries with supports 4)  elicit ideas from patients about supports that need to be added in order to achieve goals   Summary of Patient Progress:   The patient expressed support(s) to add at discharge include therapy.  The patient participated fully and was tearful at one point, saying that she wished her parents could have been proud of her before they died.  Now, she wants to make herself proud.  She calmed down and was able to remain in group the entire hour.  Therapeutic Modalities:   Motivational Interviewing Activity  Maretta Los

## 2021-04-05 MED ORDER — NICOTINE 21 MG/24HR TD PT24: 21.0000 mg | MEDICATED_PATCH | Freq: Every day | TRANSDERMAL | 0 refills | Status: AC

## 2021-04-05 MED ORDER — MIRTAZAPINE 15 MG PO TABS: 15.0000 mg | ORAL_TABLET | Freq: Every day | ORAL | 0 refills | Status: DC

## 2021-04-05 MED ORDER — PRAZOSIN HCL 2 MG PO CAPS: 2.0000 mg | ORAL_CAPSULE | Freq: Every day | ORAL | 0 refills | Status: DC

## 2021-04-05 NOTE — Progress Notes (Signed)
  [facility] Adult Case Management Discharge Plan :  Will you be returning to the same living situation after discharge:  Yes,  Home At discharge, do you have transportation home?: Yes,  Boyfriend  Do you have the ability to pay for your medications: Yes,  Medicare  Release of information consent forms completed and in the chart;  Patient's signature needed at discharge.  Patient to Follow up at:  [place]. Go to.   Specialty: Behavioral Health Why: Please go to this provider for therapy services during walk in hours:  Monday through Wednesday, from 8:00 am and 11:00 am.  Services are provided on a first come, first served basis.  Medication management services are also available. Contact information: [place] [facility], [place] on 04/28/2021.   Why: You have an appointment medication management services on 04/28/21 at 2:00 pm.  This appointment will be held in person. Contact information: [place] [address] [phone]         [facility]. Call.   Why: This agency accepts your insurance for Primary Care. Please call to schedule an appointment. Contact information: [place] SSN-[phone] [place]. Call.   Why: You may also contact this provider for Primary Care services.  They also accept your insurance. Contact information: Address: [address], [place], [place] [address]  Phone: [phone] Fax: [phone]                Next level of care provider has access to [place] and Suicide Prevention discussed: Yes,  with aunt and patient      Has patient been referred to the Quitline?: Patient refused referral  Patient has been referred for addiction treatment: [place], [place] 04/05/2021,  10:13 AM

## 2021-04-05 NOTE — Final Progress Note (Signed)
Discharge Note:  Patient denies SI/HI AVH at this time. Discharge instructions, AVS, prescriptions and transition record gone over with patient. Patient agrees to comply with medication management, follow-up visit, and outpatient therapy. Patient belongings returned to patient. Patient questions and concerns addressed and answered.  Patient ambulatory off unit.  Patient discharged to home with friend.   
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
## 2021-05-31 ENCOUNTER — Ambulatory Visit: Payer: Medicare Other

## 2021-06-11 ENCOUNTER — Other Ambulatory Visit: Payer: Self-pay

## 2021-06-11 ENCOUNTER — Emergency Department (HOSPITAL_COMMUNITY)
Admission: EM | Admit: 2021-06-11 | Discharge: 2021-06-12 | Disposition: A | Discharge status: Home / Self Care | Payer: Medicare HMO | Attending: Emergency Medicine | Admitting: Emergency Medicine

## 2021-06-11 ENCOUNTER — Encounter (HOSPITAL_COMMUNITY): Payer: Self-pay | Admitting: Emergency Medicine

## 2021-06-11 DIAGNOSIS — L539 Erythematous condition, unspecified: Secondary | ICD-10-CM | POA: Insufficient documentation

## 2021-06-11 DIAGNOSIS — Z79899 Other long term (current) drug therapy: Secondary | ICD-10-CM | POA: Insufficient documentation

## 2021-06-11 DIAGNOSIS — R0602 Shortness of breath: Secondary | ICD-10-CM | POA: Insufficient documentation

## 2021-06-11 DIAGNOSIS — F17210 Nicotine dependence, cigarettes, uncomplicated: Secondary | ICD-10-CM | POA: Insufficient documentation

## 2021-06-11 DIAGNOSIS — M7989 Other specified soft tissue disorders: Secondary | ICD-10-CM | POA: Diagnosis present

## 2021-06-11 DIAGNOSIS — R609 Edema, unspecified: Secondary | ICD-10-CM

## 2021-06-11 DIAGNOSIS — Z85828 Personal history of other malignant neoplasm of skin: Secondary | ICD-10-CM | POA: Insufficient documentation

## 2021-06-11 DIAGNOSIS — R600 Localized edema: Secondary | ICD-10-CM | POA: Diagnosis not present

## 2021-06-11 MED ORDER — IBUPROFEN 800 MG PO TABS
800.0000 mg | ORAL_TABLET | Freq: Once | ORAL | Status: AC
  Administered 2021-06-12: 800 mg via ORAL
  Filled 2021-06-11: qty 1

## 2021-06-11 NOTE — ED Triage Notes (Addendum)
Per EMS, patient from home swelling to bilateral lower extremities and hands x3 days. Taking lasix as prescribed. Denies chest pain.

## 2021-06-11 NOTE — ED Provider Notes (Signed)
Emergency Medicine Provider Triage Evaluation Note  NOELIA LENART , a 50 y.o. female  was evaluated in triage.  Pt complains of leg swelling and pain for the past 3 days.  Patient reports she also has had subjective fever and chills.  This is never happened to her before.  On Lasix for fluid retention.  Review of Systems  Positive: Fever, chills and leg pain Negative: Falls  Physical Exam  BP (!) 126/108   Pulse 88   Temp 97.7 F (36.5 C)   Resp 18   Ht 5' (1.524 m)   Wt 83.9 kg   LMP 06/14/2012   SpO2 100%   BMI 36.13 kg/m  Gen:   Awake, no distress   Resp:  Normal effort  MSK:   Moves extremities without difficulty  Other:  Edema and cellulitic skin noted to bilateral lower extremities.  No weeping noted.  Neurovascularly intact.  Medical Decision Making  Medically screening exam initiated at 9:32 PM.  Appropriate orders placed.  Lisset L Tuohey was informed that the remainder of the evaluation will be completed by another provider, this initial triage assessment does not replace that evaluation, and the importance of remaining in the ED until their evaluation is complete.     Darliss Ridgel 06/11/21 2133    Sherwood Gambler, MD 06/11/21 [phone]

## 2021-06-12 ENCOUNTER — Emergency Department (HOSPITAL_COMMUNITY): Payer: Medicare HMO

## 2021-06-12 DIAGNOSIS — R600 Localized edema: Secondary | ICD-10-CM | POA: Diagnosis not present

## 2021-06-12 LAB — URINALYSIS, ROUTINE W REFLEX MICROSCOPIC
Bilirubin Urine: NEGATIVE
Glucose, UA: NEGATIVE mg/dL
Hgb urine dipstick: NEGATIVE
Ketones, ur: NEGATIVE mg/dL
Nitrite: NEGATIVE
Protein, ur: NEGATIVE mg/dL
Specific Gravity, Urine: 1.006 (ref 1.005–1.030)
pH: 5 (ref 5.0–8.0)

## 2021-06-12 LAB — CBC WITH DIFFERENTIAL/PLATELET
Abs Immature Granulocytes: 0.02 10*3/uL (ref 0.00–0.07)
Basophils Absolute: 0 10*3/uL (ref 0.0–0.1)
Basophils Relative: 1 %
Eosinophils Absolute: 0.2 10*3/uL (ref 0.0–0.5)
Eosinophils Relative: 3 %
HCT: 41 % (ref 36.0–46.0)
Hemoglobin: 12.9 g/dL (ref 12.0–15.0)
Immature Granulocytes: 0 %
Lymphocytes Relative: 32 %
Lymphs Abs: 2.2 10*3/uL (ref 0.7–4.0)
MCH: 30.5 pg (ref 26.0–34.0)
MCHC: 31.5 g/dL (ref 30.0–36.0)
MCV: 96.9 fL (ref 80.0–100.0)
Monocytes Absolute: 0.6 10*3/uL (ref 0.1–1.0)
Monocytes Relative: 8 %
Neutro Abs: 3.8 10*3/uL (ref 1.7–7.7)
Neutrophils Relative %: 56 %
Platelets: 259 10*3/uL (ref 150–400)
RBC: 4.23 MIL/uL (ref 3.87–5.11)
RDW: 14.8 % (ref 11.5–15.5)
WBC: 6.8 10*3/uL (ref 4.0–10.5)
nRBC: 0 % (ref 0.0–0.2)

## 2021-06-12 LAB — COMPREHENSIVE METABOLIC PANEL
ALT: 65 U/L — ABNORMAL HIGH (ref 0–44)
AST: 21 U/L (ref 15–41)
Albumin: 3.6 g/dL (ref 3.5–5.0)
Alkaline Phosphatase: 104 U/L (ref 38–126)
Anion gap: 6 (ref 5–15)
BUN: 8 mg/dL (ref 6–20)
CO2: 28 mmol/L (ref 22–32)
Calcium: 8.7 mg/dL — ABNORMAL LOW (ref 8.9–10.3)
Chloride: 106 mmol/L (ref 98–111)
Creatinine, Ser: 0.87 mg/dL (ref 0.44–1.00)
GFR, Estimated: >60 mL/min (ref >60)
Glucose, Bld: 118 mg/dL — ABNORMAL HIGH (ref 70–99)
Potassium: 3.5 mmol/L (ref 3.5–5.1)
Sodium: 140 mmol/L (ref 135–145)
Total Bilirubin: 0.4 mg/dL (ref 0.3–1.2)
Total Protein: 6.7 g/dL (ref 6.5–8.1)

## 2021-06-12 LAB — BRAIN NATRIURETIC PEPTIDE: B Natriuretic Peptide: 26.7 pg/mL (ref 0.0–100.0)

## 2021-06-12 LAB — TROPONIN I (HIGH SENSITIVITY): Troponin I (High Sensitivity): <2 ng/L (ref <18)

## 2021-06-12 MED ORDER — MEDICAL COMPRESSION STOCKINGS MISC: 0 refills | Status: AC

## 2021-06-12 NOTE — ED Notes (Signed)
To x-ray

## 2021-06-12 NOTE — Discharge Instructions (Addendum)
Continue your lasix as prescribed.  I have sent a prescription for compression stockings into your pharmacy.   Recommend to wear these and elevate legs to keep swelling down. Follow-up closely with your primary care doctor. Return here for new concerns.

## 2021-06-12 NOTE — ED Provider Notes (Signed)
[facility] DEPT Provider Note   CSN: [id number] Arrival date & time: 06/11/21  2058     History Chief Complaint  Patient presents with   Leg Swelling    [name] is a 50 y.o. female.  The history is provided by the patient and medical records.   50 year old female with history of anxiety, GERD, IBS, MRSA, presenting to the ED with lower extremity swelling.  Patient reports this is been significantly worsening over the past few weeks.  States she is currently taking 80mg  lasix twice daily but swelling does not seem to be going down that much.  She is urinating frequently.  She does report some SOB but denies chest pain.  No hx of CHF.  She has been elevating her legs in the evening which helps to reduce the pressure some.  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   Jaundice    MRSA (methicillin resistant Staphylococcus aureus)    hospitalized for 48 hours in Dec 2016   Staph infection    "in my blood"    Patient Active Problem List   Diagnosis Date Noted   Bipolar affective disorder, depressed, severe, with psychotic behavior ([place]) 03/30/2021   Cannabis abuse 03/30/2021   Suicidal ideation 03/28/2021   PTSD (post-traumatic stress disorder) 04/09/2020   Severe bipolar I disorder, current or most recent episode depressed ([place]) 04/08/2020   Opiate dependence ([place]) 04/08/2020   Inadequate sleep hygiene  04/06/2017   Snoring 04/06/2017   Sleep apnea 04/06/2017   Sleep deprivation 04/06/2017   Genetic testing 08/24/2016   Family history of ovarian cancer    Family history of uterine cancer    Family history of prostate cancer    Dysphagia 06/23/2016   Cellulitis 03/02/2016   Loss of weight 02/04/2016   Diarrhea of presumed infectious origin 02/04/2016   Generalized abdominal pain 02/04/2016   Internal hemorrhoid, bleeding 04/24/2014   Irregular menstrual cycle 06/22/2012   TOBACCO ABUSE 10/19/2009   SYNCOPE 10/19/2009   CHEST PAIN 10/19/2009   SYNCOPE, HX OF 10/19/2009   CANDIDIASIS OF THE ESOPHAGUS 06/17/2009   LEG PAIN, LEFT 12/22/2008   HAIR LOSS 06/03/2008   HEMORRHOIDS, INTERNAL, WITH BLEEDING 11/01/2007   VITAMIN B12 DEFICIENCY 09/05/2007   DYSPLASTIC NEVUS 08/14/2007   THROMBOCYTOSIS 07/03/2007   Diarrhea 07/03/2007   MELANOMA, TRUNK, HX OF 07/03/2007   HYPERCHOLESTEROLEMIA 05/31/2007   DISORDER, BIPOLAR NEC 05/31/2007   PSORIASIS 05/31/2007   INSOMNIA 05/31/2007   COCAINE ABUSE 05/14/2007   PERIODONTAL DISEASE 05/14/2007   Gastritis and gastroduodenitis 05/14/2007   DIVERTICULOSIS, COLON, HX OF 05/14/2007   MYALGIA, HX OF 05/14/2007    Past Surgical History:  Procedure Laterality Date   CHOLECYSTECTOMY  08/02/2012   Procedure: LAPAROSCOPIC CHOLECYSTECTOMY WITH  INTRAOPERATIVE CHOLANGIOGRAM;  Surgeon: Rolm Bookbinder, MD;  Location: [facility];  Service: General;  Laterality: N/A;   COLONOSCOPY WITH PROPOFOL  04/09/2014   COLONOSCOPY WITH PROPOFOL N/A 07/14/2016   Procedure: COLONOSCOPY WITH PROPOFOL;  Surgeon: Milus Banister, MD;  Location: [facility];  Service: Endoscopy;  Laterality: N/A;   DILATION AND CURETTAGE OF UTERUS     w/ hysteroscopy to remove cyst   ESOPHAGOGASTRODUODENOSCOPY N/A 12/02/2017   Procedure: ESOPHAGOGASTRODUODENOSCOPY (EGD);  Surgeon: Carol Ada, MD;  Location: [facility];  Service: Endoscopy;  Laterality: N/A;    ESOPHAGOGASTRODUODENOSCOPY (EGD) WITH PROPOFOL  04/09/2014   ESOPHAGOGASTRODUODENOSCOPY (EGD) WITH PROPOFOL N/A 07/14/2016   Procedure: ESOPHAGOGASTRODUODENOSCOPY (EGD) WITH PROPOFOL;  Surgeon: Milus Banister, MD;  Location: [facility];  Service: Endoscopy;  Laterality: N/A;   HYSTEROSCOPY WITH D & C  06/22/2012   Procedure: DILATATION AND CURETTAGE /HYSTEROSCOPY;  Surgeon: Lahoma Crocker, MD;  Location: [facility];  Service: Gynecology;  Laterality: N/A;   KNEE ARTHROSCOPY  2011   left   ORIF TOE FRACTURE Left 11/05/2014   Procedure: OPEN REDUCTION INTERNAL FIXATION (ORIF) LEFT FIFTH METATARSAL (TOE) FRACTURE WITH CALCANEAL BONE GRAFT;  Surgeon: Dorna Leitz, MD;  Location: [facility];  Service: Orthopedics;  Laterality: Left;   WISDOM TOOTH EXTRACTION       OB History   No obstetric history on file.     Family History  Problem Relation Age of Onset   Heart disease Father    Stroke Father    Heart attack Father    Skin cancer Father    Diabetes Father    Uterine cancer Mother 83   COPD Mother    Cancer - Colon Maternal Grandfather        mets to bone   Stomach cancer Paternal Grandmother    Cervical cancer Sister        dx between 26-33   Migraines Sister    Prostate cancer Maternal Uncle 65       mets to bone   Ovarian cancer Paternal Aunt    Heart attack Paternal Grandfather    Skin cancer Paternal Aunt     Social History   Tobacco Use   Smoking status: Every Day    Packs/day: 1.00    Years: 33.00    Pack years: 33.00    Types: Cigarettes   Smokeless tobacco: Never   Tobacco comments:    form given 02-04-16   Substance Use Topics   Alcohol use: No    Alcohol/week: 0.0 standard drinks   Drug use: No    Types: Marijuana    Comment: history of use    Home Medications Prior to Admission medications   Medication Sig Start Date End Date Taking? Authorizing Provider  acetaminophen (TYLENOL) 325 MG tablet Take 650 mg by mouth every 6 (six) hours as  needed for mild pain or headache.    [provider]  clonazePAM (KLONOPIN) 1 MG tablet Take 1 mg by mouth 2 (two) times daily. 10/10/20   [provider]  gabapentin (NEURONTIN) 600 MG tablet Take 600 mg by mouth 3 (three) times daily. 09/29/20   [provider]  hydrOXYzine (VISTARIL) 50 MG capsule Take 50 mg by mouth daily as needed for anxiety. 09/24/20   [provider]  ibuprofen (ADVIL) 800 MG tablet Take 800 mg by mouth 4 (four) times daily as needed for headache or mild pain. 06/05/20   [provider]  mirtazapine (REMERON) 15 MG tablet Take  1 tablet (15 mg total) by mouth at bedtime. 04/05/21   Corky Sox, MD  nicotine (NICODERM CQ - DOSED IN MG/24 HOURS) 21 mg/24hr patch Place 1 patch (21 mg total) onto the skin daily. 04/05/21   Corky Sox, MD  pantoprazole (PROTONIX) 40 MG tablet Take 40 mg by mouth daily. 10/04/20   [provider]  prazosin (MINIPRESS) 2 MG capsule Take 1 capsule (2 mg total) by mouth at bedtime. 04/05/21   Corky Sox, MD  QUEtiapine (SEROQUEL) 25 MG tablet Take 25 mg by mouth 2 (two) times daily. 10/05/20   [provider]  QUEtiapine (SEROQUEL) 300 MG tablet Take 1 tablet (300 mg total) by mouth at bedtime. 03/28/21   Rosezetta Schlatter, MD    Allergies    Codeine  Review of Systems   Review of Systems  Cardiovascular:  Positive for leg swelling.  All other systems reviewed and are negative.  Physical Exam Updated Vital Signs BP (!) 126/108   Pulse 88   Temp 97.7 F (36.5 C)   Resp 18   Ht 5' (1.524 m)   Wt 83.9 kg   LMP 06/14/2012   SpO2 100%   BMI 36.13 kg/m   Physical Exam Vitals and nursing note reviewed.  Constitutional:      Appearance: She is well-developed.  HENT:     Head: Normocephalic and atraumatic.  Eyes:     Conjunctiva/sclera: Conjunctivae normal.     Pupils: Pupils are equal, round, and reactive to light.  Cardiovascular:     Rate and Rhythm: Normal rate and  regular rhythm.     Heart sounds: Normal heart sounds.  Pulmonary:     Effort: Pulmonary effort is normal. No respiratory distress.     Breath sounds: Normal breath sounds. No rhonchi.  Abdominal:     General: Bowel sounds are normal.     Palpations: Abdomen is soft.  Musculoskeletal:        General: Normal range of motion.     Cervical back: Normal range of motion.     Comments: 2+ pitting edema of BLE up to knees, slight erythema at ankles but no warmth to touch, induration, or streaking  Skin:    General: Skin is warm and dry.  Neurological:     Mental Status: She is alert and oriented to person, place, and time.    ED Results / Procedures / Treatments   Labs (all labs ordered are listed, but only abnormal results are displayed) Labs Reviewed  COMPREHENSIVE METABOLIC PANEL - Abnormal; Notable for the following components:      Result Value   Glucose, Bld 118 (*)    Calcium 8.7 (*)    ALT 65 (*)    All other components within normal limits  URINALYSIS, ROUTINE W REFLEX MICROSCOPIC - Abnormal; Notable for the following components:   APPearance HAZY (*)    Leukocytes,Ua TRACE (*)    Bacteria, UA RARE (*)    All other components within normal limits  CBC WITH DIFFERENTIAL/PLATELET  BRAIN NATRIURETIC PEPTIDE  TROPONIN I (HIGH SENSITIVITY)  TROPONIN I (HIGH SENSITIVITY)    EKG None  Radiology DG Chest 2 View  Result Date: 06/12/2021 CLINICAL DATA:  Shortness of breath and lower extremity edema EXAM: CHEST - 2 VIEW COMPARISON:  12/15/2020 FINDINGS: The heart size and mediastinal contours are within normal limits. Both lungs are clear. The visualized skeletal structures are unremarkable. IMPRESSION: No active cardiopulmonary disease. Electronically Signed   By: Linus Mako.D.  On: 06/12/2021 03:54    Procedures Procedures   Medications Ordered in ED Medications  ibuprofen (ADVIL) tablet 800 mg (800 mg Oral Given 06/12/21 0259)    ED Course  I have reviewed the  triage vital signs and the nursing notes.  Pertinent labs & imaging results that were available during my care of the patient were reviewed by me and considered in my medical decision making (see chart for details).    MDM Rules/Calculators/A&P                           50 year old female presenting to the ED with bilateral lower extremity swelling.  Currently on Lasix 80 mg twice daily but has not had any real improvement of the swelling.  She does report some intermittent shortness of breath but denies any chest pain.  She has no history of CHF.  On exam she does have 3+ pitting edema up to the level of the knees.  There is some mild erythema at ankles but no warmth to touch, induration, or streaking of the leg.  This seems more consistent with venous stasis.  Labs pending along with EKG and chest x-ray.  Will reassess.  Work-up is overall reassuring.  Normal white count, remains afebrile.  Doubt cellulitis, does seem more consistent with venous stasis.  No signs of congestive heart failure.  She does not have any proteinuria or low bodily protein levels to suggest nephrotic syndrome.  Unfortunately, she is already on very high doses of Lasix (80mg  BID) and I am hesitant to increase this further.  I have recommended that she wear compression stockings and try to elevate her legs to see if this will help with the edema.  She will need to follow-up with her primary care doctor for ongoing management.  Can return here for new concerns.  Final Clinical Impression(s) / ED Diagnoses Final diagnoses:  Peripheral edema    Rx / DC Orders ED Discharge Orders          Ordered    Elastic Bandages & Supports (MEDICAL COMPRESSION STOCKINGS) [place]        06/12/21 0532             Larene Pickett, PA-C 06/12/21 7026    Maudie Flakes, MD 06/12/21 [phone]

## 2021-08-31 ENCOUNTER — Ambulatory Visit: Payer: Medicare HMO

## 2021-09-16 ENCOUNTER — Ambulatory Visit: Payer: Medicaid Other

## 2021-09-27 ENCOUNTER — Ambulatory Visit: Payer: Medicaid Other

## 2021-12-19 ENCOUNTER — Other Ambulatory Visit: Payer: Self-pay

## 2021-12-19 ENCOUNTER — Emergency Department (HOSPITAL_COMMUNITY)
Admission: EM | Admit: 2021-12-19 | Discharge: 2021-12-19 | Disposition: A | Discharge status: Home / Self Care | Payer: 59 | Attending: Emergency Medicine | Admitting: Emergency Medicine

## 2021-12-19 DIAGNOSIS — Z79899 Other long term (current) drug therapy: Secondary | ICD-10-CM | POA: Diagnosis not present

## 2021-12-19 DIAGNOSIS — M7989 Other specified soft tissue disorders: Secondary | ICD-10-CM | POA: Diagnosis present

## 2021-12-19 DIAGNOSIS — L03116 Cellulitis of left lower limb: Secondary | ICD-10-CM | POA: Insufficient documentation

## 2021-12-19 LAB — COMPREHENSIVE METABOLIC PANEL
ALT: 37 U/L (ref 0–44)
AST: 23 U/L (ref 15–41)
Albumin: 3 g/dL — ABNORMAL LOW (ref 3.5–5.0)
Alkaline Phosphatase: 83 U/L (ref 38–126)
Anion gap: 6 (ref 5–15)
BUN: 10 mg/dL (ref 6–20)
CO2: 29 mmol/L (ref 22–32)
Calcium: 8.6 mg/dL — ABNORMAL LOW (ref 8.9–10.3)
Chloride: 102 mmol/L (ref 98–111)
Creatinine, Ser: 1.29 mg/dL — ABNORMAL HIGH (ref 0.44–1.00)
GFR, Estimated: 51 mL/min — ABNORMAL LOW (ref >60)
Glucose, Bld: 96 mg/dL (ref 70–99)
Potassium: 4.2 mmol/L (ref 3.5–5.1)
Sodium: 137 mmol/L (ref 135–145)
Total Bilirubin: 0.2 mg/dL — ABNORMAL LOW (ref 0.3–1.2)
Total Protein: 5.8 g/dL — ABNORMAL LOW (ref 6.5–8.1)

## 2021-12-19 LAB — CBC WITH DIFFERENTIAL/PLATELET
Abs Immature Granulocytes: 0.02 10*3/uL (ref 0.00–0.07)
Basophils Absolute: 0 10*3/uL (ref 0.0–0.1)
Basophils Relative: 1 %
Eosinophils Absolute: 0.2 10*3/uL (ref 0.0–0.5)
Eosinophils Relative: 3 %
HCT: 42.3 % (ref 36.0–46.0)
Hemoglobin: 13 g/dL (ref 12.0–15.0)
Immature Granulocytes: 0 %
Lymphocytes Relative: 27 %
Lymphs Abs: 2 10*3/uL (ref 0.7–4.0)
MCH: 31.3 pg (ref 26.0–34.0)
MCHC: 30.7 g/dL (ref 30.0–36.0)
MCV: 101.7 fL — ABNORMAL HIGH (ref 80.0–100.0)
Monocytes Absolute: 0.4 10*3/uL (ref 0.1–1.0)
Monocytes Relative: 5 %
Neutro Abs: 4.8 10*3/uL (ref 1.7–7.7)
Neutrophils Relative %: 64 %
Platelets: 282 10*3/uL (ref 150–400)
RBC: 4.16 MIL/uL (ref 3.87–5.11)
RDW: 12.8 % (ref 11.5–15.5)
WBC: 7.4 10*3/uL (ref 4.0–10.5)
nRBC: 0 % (ref 0.0–0.2)

## 2021-12-19 LAB — I-STAT BETA HCG BLOOD, ED (MC, WL, AP ONLY): I-stat hCG, quantitative: <5 m[IU]/mL (ref <5)

## 2021-12-19 MED ORDER — SODIUM CHLORIDE 0.9 % IV SOLN
1.0000 g | Freq: Once | INTRAVENOUS | Status: AC
  Administered 2021-12-19: 1 g via INTRAVENOUS
  Filled 2021-12-19: qty 10

## 2021-12-19 MED ORDER — HYDROCODONE-ACETAMINOPHEN 5-325 MG PO TABS
1.0000 | ORAL_TABLET | Freq: Once | ORAL | Status: AC
  Administered 2021-12-19: 1 via ORAL
  Filled 2021-12-19: qty 1

## 2021-12-19 MED ORDER — CEPHALEXIN 500 MG PO CAPS: 500.0000 mg | ORAL_CAPSULE | Freq: Two times a day (BID) | ORAL | 0 refills | Status: AC

## 2021-12-19 MED ORDER — SODIUM CHLORIDE 0.9 % IV BOLUS
1000.0000 mL | Freq: Once | INTRAVENOUS | Status: AC
  Administered 2021-12-19: 1000 mL via INTRAVENOUS

## 2021-12-19 NOTE — ED Provider Notes (Signed)
?[facility] ?Provider Note ? ? ?CSN: [id number] ?Arrival date & time: 12/19/21  1857 ? ?  ? ?History ? ?Chief Complaint  ?Patient presents with  ? Leg Pain  ? ? ?[name] is a 51 y.o. female. ? ?HPI ?Presents with pain and swelling in her left leg.  She notes that she has had similar episodes previously but now over the past 2 days has developed new erythema, swelling throughout the left leg. ?Fever, nausea, vomiting.  No relief with anything. ? ?Home Medications ?Prior to Admission medications   ?Medication Sig Start Date End Date Taking? Authorizing Provider  ?cephALEXin (KEFLEX) 500 MG capsule Take 1 capsule (500 mg total) by mouth 2 (two) times daily for 5 days. 12/19/21 12/24/21 Yes Carmin Muskrat, MD  ?acetaminophen (TYLENOL) 325 MG tablet Take 650 mg by mouth every 6 (six) hours as needed for mild pain or headache.    [provider]  ?clonazePAM (KLONOPIN) 1 MG tablet Take 1 mg by mouth 2 (two) times daily. 10/10/20   [provider]  ?Elastic Bandages & Supports ([place]) [place] 1 pair of compression stocking for patient use. 06/12/21   Larene Pickett, PA-C  ?gabapentin (NEURONTIN) 600 MG tablet Take 600 mg by mouth 3 (three) times daily. 09/29/20   [provider]  ?hydrOXYzine (VISTARIL) 50 MG capsule Take 50 mg by mouth daily as needed for anxiety. 09/24/20   [provider]  ?ibuprofen (ADVIL) 800 MG tablet Take 800 mg by mouth 4 (four) times daily as needed for headache or mild pain. 06/05/20   [provider]  ?mirtazapine (REMERON) 15 MG tablet Take 1 tablet (15 mg total) by mouth at bedtime. 04/05/21   Corky Sox, MD  ?nicotine (NICODERM CQ - DOSED IN MG/24 HOURS) 21 mg/24hr patch Place 1 patch (21 mg total) onto the skin daily. 04/05/21   Corky Sox, MD  ?pantoprazole (PROTONIX) 40 MG tablet Take 40 mg by mouth daily. 10/04/20   [provider]  ?prazosin (MINIPRESS) 2 MG capsule  Take 1 capsule (2 mg total) by mouth at bedtime. 04/05/21   Corky Sox, MD  ?QUEtiapine (SEROQUEL) 25 MG tablet Take 25 mg by mouth 2 (two) times daily. 10/05/20   [provider]  ?QUEtiapine (SEROQUEL) 300 MG tablet Take 1 tablet (300 mg total) by mouth at bedtime. 03/28/21   Rosezetta Schlatter, MD  ?   ? ?Allergies    ?Codeine   ? ?Review of Systems   ?Review of Systems  ?Constitutional:   ?     Per HPI, otherwise negative  ?HENT:    ?     Per HPI, otherwise negative  ?Respiratory:    ?     Per HPI, otherwise negative  ?Cardiovascular:   ?     Per HPI, otherwise negative  ?Gastrointestinal:  Negative for vomiting.  ?Endocrine:  ?     Negative aside from HPI  ?Genitourinary:   ?     Neg aside from HPI   ?Musculoskeletal:   ?     Per HPI, otherwise negative  ?Skin:  Positive for color change and wound.  ?Neurological:  Negative for syncope.  ? ?Physical Exam ?Updated Vital Signs ?BP 130/70   Pulse 82   Temp 98.9 ?F (37.2 ?C)   Resp 18   LMP 06/14/2012   SpO2 98%  ?Physical Exam ?Vitals and nursing note reviewed.  ?Constitutional:   ?   General: She is not  in acute distress. ?   Appearance: She is well-developed.  ?HENT:  ?   Head: Normocephalic and atraumatic.  ?Eyes:  ?   Conjunctiva/sclera: Conjunctivae normal.  ?Cardiovascular:  ?   Rate and Rhythm: Normal rate and regular rhythm.  ?Pulmonary:  ?   Effort: Pulmonary effort is normal. No respiratory distress.  ?   Breath sounds: Normal breath sounds. No stridor.  ?Abdominal:  ?   General: There is no distension.  ?Skin: ?   General: Skin is warm and dry.  ? ?    ?Neurological:  ?   Mental Status: She is alert and oriented to person, place, and time.  ?   Cranial Nerves: No cranial nerve deficit.  ?Psychiatric:     ?   Mood and Affect: Mood normal.  ? ? ?ED Results / Procedures / Treatments   ?Labs ?(all labs ordered are listed, but only abnormal results are displayed) ?Labs Reviewed  ?COMPREHENSIVE METABOLIC PANEL - Abnormal; Notable for the following  components:  ?    Result Value  ? Creatinine, Ser 1.29 (*)   ? Calcium 8.6 (*)   ? Total Protein 5.8 (*)   ? Albumin 3.0 (*)   ? Total Bilirubin 0.2 (*)   ? GFR, Estimated 51 (*)   ? All other components within normal limits  ?CBC WITH DIFFERENTIAL/PLATELET - Abnormal; Notable for the following components:  ? MCV 101.7 (*)   ? All other components within normal limits  ?I-STAT BETA HCG BLOOD, ED ([facility], [facility], [facility] ONLY)  ? ? ?EKG ?None ? ?Radiology ?No results found. ? ?Procedures ?Procedures  ? ? ?Medications Ordered in ED ?Medications  ?HYDROcodone-acetaminophen (NORCO/VICODIN) 5-325 MG per tablet 1 tablet (1 tablet Oral Given 12/19/21 2128)  ?sodium chloride 0.9 % bolus 1,000 mL (1,000 mLs Intravenous New Bag/Given 12/19/21 2130)  ?cefTRIAXone (ROCEPHIN) 1 g in sodium chloride 0.9 % 100 mL IVPB (1 g Intravenous New Bag/Given 12/19/21 2130)  ? ? ?ED Course/ Medical Decision Making/ A&P ?This patient with a Hx of prior cellulitis, bipolar disorderpresents to the ED for concern of , this involves an extensive number of treatment options, and is a complaint that carries with it a high risk of complications and morbidity.   ? ?The differential diagnosis includes  ? ? ?Social Determinants of Health: ? ?Psychiatric disease ? ?Additional history obtained: ? ?Additional history and/or information obtained from chart review, notable for history of tobacco abuse, cocaine abuse, bipolar disorder ? ? ?After the initial evaluation, orders, including: Labs were initiated. ? ? ?Patient placed on Cardiac and Pulse-Oximetry Monitors. ?The patient was maintained on a cardiac monitor.  The cardiac monitored showed an rhythm of 80 sinus normal ?The patient was also maintained on pulse oximetry. The readings were typically 100% room air normal ? ? ?On repeat evaluation of the patient improved ?11:07 PM ?Patient sitting up eating a sandwich ?Lab Tests: ? ?I personally interpreted labs.  The pertinent results include: No evidence for bacteremia,  sepsis ? ? ?Dispostion / Final MDM: ? ?After consideration of the diagnostic results and the patient's response to treatment, this adult female with multiple medical issues presents with erythema, swelling left calf.  Given the signs and symptoms initial suspicion for cellulitis patient started on antibiotics.  No lab or vital sign suggestion of bacteremia, sepsis.  Patient discharged to follow-up as an outpatient. ? ?Final Clinical Impression(s) / ED Diagnoses ?Final diagnoses:  ?Cellulitis of left lower extremity  ? ? ?Rx / DC Orders ?  ED Discharge Orders   ? ?      Ordered  ?  cephALEXin (KEFLEX) 500 MG capsule  2 times daily       ? 12/19/21 2307  ? ?  ?  ? ?  ? ? ?  ?Carmin Muskrat, MD ?12/19/21 2307 ? ?

## 2021-12-19 NOTE — ED Triage Notes (Signed)
BIB EMS from home with swelling to bilateral lower extremities. Redness. States this has been going on since 2012. Pain and swelling, redness.  Is on Lasix. ?

## 2022-02-22 ENCOUNTER — Other Ambulatory Visit: Payer: Self-pay | Admitting: Internal Medicine

## 2022-02-22 DIAGNOSIS — Z1231 Encounter for screening mammogram for malignant neoplasm of breast: Secondary | ICD-10-CM

## 2022-02-28 ENCOUNTER — Encounter (HOSPITAL_BASED_OUTPATIENT_CLINIC_OR_DEPARTMENT_OTHER): Payer: Medicare Other | Attending: General Surgery | Admitting: General Surgery

## 2022-02-28 DIAGNOSIS — F17210 Nicotine dependence, cigarettes, uncomplicated: Secondary | ICD-10-CM | POA: Insufficient documentation

## 2022-02-28 DIAGNOSIS — I890 Lymphedema, not elsewhere classified: Secondary | ICD-10-CM | POA: Insufficient documentation

## 2022-02-28 DIAGNOSIS — M069 Rheumatoid arthritis, unspecified: Secondary | ICD-10-CM | POA: Diagnosis not present

## 2022-02-28 DIAGNOSIS — F319 Bipolar disorder, unspecified: Secondary | ICD-10-CM | POA: Diagnosis not present

## 2022-02-28 DIAGNOSIS — L97229 Non-pressure chronic ulcer of left calf with unspecified severity: Secondary | ICD-10-CM | POA: Diagnosis not present

## 2022-02-28 NOTE — Progress Notes (Addendum)
[name], [name] ([id number]) Visit Report for 02/28/2022 Chief Complaint Document Details Patient Name: Date of Service: Michelle Aver NI L. 02/28/2022 9:00 A M Medical Record Number: [id number] Patient Account Number: [id number] Date of Birth/Sex: Treating RN: 09/18/1970 (51 y.o. Elam Dutch Primary Care Provider: Sandi Mariscal Other Clinician: Referring Provider: Treating Provider/Extender: Nance Pew in Treatment: 0 Information Obtained from: Patient Chief Complaint Patient presents to the wound care center due with non-wound condition(s)--severe lymphedema Electronic Signature(s) Signed: 02/28/2022 10:05:43 AM By: Fredirick Maudlin MD FACS Entered By: Fredirick Maudlin on 02/28/2022 10:05:42 -------------------------------------------------------------------------------- HPI Details Patient Name: Date of Service: [name], Michelle NI L. 02/28/2022 9:00 A M Medical Record Number: [id number] Patient Account Number: [id number] Date of Birth/Sex: Treating RN: 09/09/1970 (51 y.o. Elam Dutch Primary Care Provider: Sandi Mariscal Other Clinician: Referring Provider: Treating Provider/Extender: Nance Pew in Treatment: 0 History of Present Illness HPI Description: ADMISSION 02/28/2022 This is a 52 year old woman with relevant past medical history significant for lower extremity edema and cellulitis who presents with blisters on her left anterior tibia and calf and skin changes consistent with chronic lymphedema. This has been present for several months and has not responded well to diuretics. Her legs are painful. She says that she has worn compression stockings in the past but has not done so for over a year. She does not have lymphedema pumps. Home health has been wrapping her legs, but I am not sure what dressing they are using. Arterial ultrasound done on the outside shows normal multiphasic flow bilaterally without evidence of occlusive  peripheral vascular disease. Venous reflux studies have not been done. She has had negative DVT scans. She is not diabetic. Both legs are red with 2+ nonpitting edema. The skin particularly on the left leg is very scaly and hard. She has 2 blisters on her anterior tibial surface and 1 on the posterior calf. No open wounds on either leg. Electronic Signature(s) Signed: 02/28/2022 10:10:30 AM By: Fredirick Maudlin MD FACS Entered By: Fredirick Maudlin on 02/28/2022 10:10:30 -------------------------------------------------------------------------------- Physical Exam Details Patient Name: Date of Service: [name], Michelle Stall NI L. 02/28/2022 9:00 A M Medical Record Number: [id number] Patient Account Number: [id number] Date of Birth/Sex: Treating RN: 1970-09-14 (51 y.o. Elam Dutch Primary Care Provider: Sandi Mariscal Other Clinician: Referring Provider: Treating Provider/Extender: Burgess Amor Weeks in Treatment: 0 Constitutional . . . . No acute distress. Respiratory Normal work of breathing on room air.. Cardiovascular . Skin changes consistent with longstanding lymphedema. 2+ non-pitting edema bilaterally.. Notes 02/28/2022: No open wounds, but she has extensive skin changes consistent with longstanding lymphedema. She has blisters on her left anterior tibia and left posterior calf. She has hard, scaly, thickened skin. Electronic Signature(s) Signed: 02/28/2022 10:13:39 AM By: Fredirick Maudlin MD FACS Entered By: Fredirick Maudlin on 02/28/2022 10:13:38 -------------------------------------------------------------------------------- Physician Orders Details Patient Name: Date of Service: [name], Michelle Stall NI L. 02/28/2022 9:00 A M Medical Record Number: [id number] Patient Account Number: [id number] Date of Birth/Sex: Treating RN: 1971-04-27 (51 y.o. 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 - Dr. Celine Ahr RM 1 Return A Bathing/ Shower/ Hygiene May shower with  protection but do not get wound dressing(s) wet. Edema Control - Lymphedema / SCD / Other Elevate legs to the level of the heart or above for 30 minutes daily and/or when sitting, a frequency of: - throughout the day Avoid standing for long periods of time. Exercise regularly Moisturize legs daily. - right leg Compression stocking or Garment 20-30 mm/Hg pressure to: - right leg daily [place] wound care orders this week; continue Home Health for wound care. May utilize formulary equivalent dressing for wound treatment orders unless otherwise specified. - hold wound care this week Other Home Health Orders/Instructions: - [facility]ound Treatment Wound #1 - Lower Leg Wound Laterality: Left, Posterior Peri-Wound Care: Sween Lotion (Moisturizing lotion) 1 x Per Week/30 Days Discharge Instructions: Apply moisturizing lotion as directed Prim Dressing: KerraCel Ag Gelling Fiber Dressing, 4x5 in (silver alginate) 1 x Per Week/30 Days ary Discharge Instructions: Apply silver alginate to wound bed as instructed Secondary Dressing: Woven Gauze Sponge, Non-Sterile 4x4 in 1 x Per Week/30 Days Discharge Instructions: Apply over primary dressing as directed. Compression Wrap: FourPress (4 layer compression wrap) 1 x Per Week/30 Days Discharge Instructions: Apply four layer compression as directed. May also use Miliken CoFlex 2 layer compression system as alternative. Wound #2 - Lower Leg Wound Laterality: Left, Anterior Peri-Wound Care: Sween Lotion (Moisturizing  lotion) 1 x Per Week/30 Days Discharge Instructions: Apply moisturizing lotion as directed Prim Dressing: KerraCel Ag Gelling Fiber Dressing, 4x5 in (silver alginate) 1 x Per Week/30 Days ary Discharge Instructions: Apply silver alginate to wound bed as instructed Secondary Dressing: Woven Gauze Sponge, Non-Sterile 4x4 in 1 x Per Week/30 Days Discharge Instructions: Apply over primary dressing as directed. Compression Wrap: FourPress (4 layer compression wrap) 1 x Per Week/30 Days Discharge Instructions: Apply four layer compression as directed. May also use Miliken CoFlex 2 layer compression system as alternative. Electronic Signature(s) Signed: 02/28/2022 11:38:54 AM By: Fredirick Maudlin MD FACS Entered By: Fredirick Maudlin on 02/28/2022 10:13:54 -------------------------------------------------------------------------------- Problem List Details Patient Name: Date of Service: [name], Michelle Stall NI L. 02/28/2022 9:00 A M Medical Record Number: [id number] Patient Account Number: [id number] Date of Birth/Sex: Treating RN: [birth year] (51 y.o. 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 02/28/2022 No Yes F31.9 Bipolar disorder, unspecified 02/28/2022 No Yes I89.0 Lymphedema, not elsewhere classified 02/28/2022 No Yes Inactive Problems Resolved Problems Electronic Signature(s) Signed: 02/28/2022 10:04:48 AM By: Fredirick Maudlin MD FACS Previous Signature: 02/28/2022 9:07:30 AM Version By: Fredirick Maudlin MD FACS Previous Signature: 02/28/2022 9:07:30 AM Version By: Fredirick Maudlin MD FACS Previous Signature: 02/28/2022 8:59:55 AM Version By: Fredirick Maudlin MD FACS Entered By: Fredirick Maudlin on 02/28/2022  10:04:48 -------------------------------------------------------------------------------- Progress Note Details Patient Name: Date of Service: [name], Michelle NI L. 02/28/2022 9:00 A M Medical Record Number: [id number] Patient Account Number: [id number] Date of Birth/Sex: Treating RN: Apr 26, 1971 (51 y.o. Elam Dutch Primary Care Provider: Sandi Mariscal Other Clinician: Referring Provider: Treating Provider/Extender: Nance Pew in Treatment: 0 Subjective Chief Complaint Information obtained from Patient Patient presents to the wound care center due with non-wound condition(s)--severe lymphedema History of Present Illness (HPI) ADMISSION 02/28/2022 This is a 51 year old woman with relevant past medical history significant for lower extremity edema and cellulitis who presents with blisters on her left anterior tibia and calf and skin  changes consistent with chronic lymphedema. This has been present for several months and has not responded well to diuretics. Her legs are painful. She says that she has worn compression stockings in the past but has not done so for over a year. She does not have lymphedema pumps. Home health has been wrapping her legs, but I am not sure what dressing they are using. Arterial ultrasound done on the outside shows normal multiphasic flow bilaterally without evidence of occlusive peripheral vascular disease. Venous reflux studies have not been done. She has had negative DVT scans. She is not diabetic. Both legs are red with 2+ nonpitting edema. The skin particularly on the left leg is very scaly and hard. She has 2 blisters on her anterior tibial surface and 1 on the posterior calf. No open wounds on either leg. Patient History Information obtained from Patient, Chart. Allergies codeine (Reaction: nausea/vomiting), Prozac (Reaction: aggressive behavior) Family History Cancer - Siblings,Mother,Paternal Grandparents,Maternal Grandparents, Heart  Disease - Mother,Father, Hypertension - Father,Siblings, Stroke - Father, No family history of Diabetes, Hereditary Spherocytosis, Kidney Disease, Lung Disease, Seizures, Thyroid Problems, Tuberculosis. 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 Medical History Eyes Denies history of Cataracts, Glaucoma, Optic Neuritis Hematologic/Lymphatic Patient has history of Lymphedema Cardiovascular Patient has history of Congestive Heart Failure Endocrine Denies history of Type I Diabetes, Type II Diabetes Integumentary (Skin) Denies history of History of Burn Musculoskeletal Patient has history of Rheumatoid Arthritis Oncologic Denies history of Received Chemotherapy, Received Radiation Psychiatric Denies history of Anorexia/bulimia, Confinement Anxiety Hospitalization/Surgery History - left foot ORIF. - cholecystectomy. - ovarian cyst removed. Medical A Surgical History Notes nd Constitutional Symptoms (General Health) obesity Cardiovascular hypercholesteremia Gastrointestinal bleeding hemorrhoids, diverticulosis,,eosinophilic esophagitis, IBS Oncologic skin CA removed Review of Systems (ROS) Constitutional Symptoms (General Health) Complains or has symptoms of Fatigue, Chills. Eyes Complains or has symptoms of Glasses / Contacts - readers. Ear/Nose/Mouth/Throat Denies complaints or symptoms of Chronic sinus problems or rhinitis. Respiratory Denies complaints or symptoms of Chronic or frequent coughs, Shortness of Breath. Cardiovascular Denies complaints or symptoms of Chest pain. Endocrine Denies complaints or symptoms of Heat/cold intolerance. Genitourinary Denies complaints or symptoms of Frequent urination. Integumentary (Skin) Complains or has symptoms of Wounds - left lower leg. Musculoskeletal Complains or has symptoms of Muscle Pain, Muscle  Weakness. Neurologic Complains or has symptoms of Numbness/parasthesias - both feet. Psychiatric Denies complaints or symptoms of Claustrophobia, Suicidal. Objective Constitutional No acute distress. Vitals Time Taken: 9:10 AM, Height: 60 in, Source: Stated, Weight: 178 lbs, Source: Stated, BMI: 34.8, Temperature: 97.8 F, Pulse: 78 bpm, Respiratory Rate: 18 breaths/min, Blood Pressure: 125/84 mmHg. Respiratory Normal work of breathing on room air.. Cardiovascular Skin changes consistent with longstanding lymphedema. 2+ non-pitting edema bilaterally.. General Notes: 02/28/2022: No open wounds, but she has extensive skin changes consistent with longstanding lymphedema. She has blisters on her left anterior tibia and left posterior calf. She has hard, scaly, thickened skin. Integumentary (Hair, Skin) Wound #1 status is Open. Original cause of wound was Blister. The date acquired was: 01/27/2022. The wound is located on the Left,Posterior Lower Leg. The wound measures 0.1cm length x 0.1cm width x 0.1cm depth; 0.008cm^2 area and 0.001cm^3 volume. The wound is limited to skin breakdown. There is no tunneling or undermining noted. There is a medium amount of serous drainage noted. The wound margin is indistinct and nonvisible. There  is no granulation within the wound bed. There is no necrotic tissue within the wound bed. General Notes: blisters Wound #2 status is Open. Original cause of wound was Blister. The date acquired was: 01/27/2022. The wound is located on the Left,Anterior Lower Leg. The wound measures 1.4cm length x 5.3cm width x 0.1cm depth; 5.828cm^2 area and 0.583cm^3 volume. The wound is limited to skin breakdown. There is no tunneling or undermining noted. There is a none present amount of drainage noted. The wound margin is indistinct and nonvisible. There is no granulation within the wound bed. There is no necrotic tissue within the wound bed. 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 There was a Four Layer Compression Therapy Procedure by Baruch Gouty, RN. Post procedure Diagnosis Wound #1: Same as Pre-Procedure Plan Follow-up Appointments: Return Appointment in 1 week. - Dr. Celine Ahr RM 1 Bathing/ Shower/ Hygiene: May shower with protection but do not get wound dressing(s) wet. Edema Control - Lymphedema / SCD / Other: Elevate legs to the level of the heart or above for 30 minutes daily and/or when sitting, a frequency of: - throughout the day Avoid standing for long periods of time. Exercise regularly Moisturize legs daily. - right leg Compression stocking or Garment 20-30 mm/Hg pressure to: - right leg daily Home Health: New wound care orders this week; continue Home Health for wound care. May utilize formulary equivalent dressing for wound treatment orders unless otherwise specified. - hold wound care this week Other Home Health Orders/Instructions: - [facility] WOUND #1: - Lower Leg Wound Laterality: Left, Posterior Peri-Wound Care: Sween Lotion (Moisturizing lotion) 1 x Per Week/30 Days Discharge Instructions: Apply moisturizing lotion as directed Prim Dressing: KerraCel Ag Gelling Fiber Dressing, 4x5 in (silver alginate) 1 x Per Week/30 Days ary Discharge Instructions: Apply silver alginate to wound bed as instructed Secondary Dressing: Woven Gauze Sponge, Non-Sterile 4x4 in 1 x Per Week/30 Days Discharge Instructions: Apply over primary dressing as directed. Com pression Wrap: FourPress (4 layer compression wrap) 1 x Per Week/30 Days Discharge Instructions: Apply four layer compression as directed. May also use Miliken CoFlex 2 layer compression system as alternative. WOUND #2: - Lower Leg Wound  Laterality: Left, Anterior Peri-Wound Care: Sween Lotion (Moisturizing lotion) 1 x Per Week/30 Days Discharge Instructions: Apply moisturizing lotion as directed Prim Dressing: KerraCel Ag Gelling Fiber Dressing, 4x5 in (silver alginate) 1 x Per Week/30 Days ary Discharge Instructions: Apply silver alginate to wound bed as instructed Secondary Dressing: Woven Gauze Sponge, Non-Sterile 4x4 in 1 x Per Week/30 Days Discharge Instructions: Apply over primary dressing as directed. Com pression Wrap: FourPress (4 layer compression wrap) 1 x Per Week/30 Days Discharge Instructions: Apply four layer compression as directed. May also use Miliken CoFlex 2 layer compression system as alternative. 02/28/2022: 51 year old woman who presents to the wound care center for management of blisters relative to lymphedema. No open wounds, but she has extensive skin changes consistent with longstanding lymphedema. She has blisters on her left anterior tibia and left posterior calf. She has hard, scaly, thickened skin. I elected not to unroofed the blisters today. We will apply silver alginate to absorb any drainage as I anticipate that they will spontaneously rupture. We will moisturize her legs heavily and apply 4-layer compression. She will benefit in the long-term from lymphedema pumps  but needs to complete her 4 weeks of conventional therapy before this can be ordered. She will follow-up with me in 1 week. Electronic Signature(s) Signed: 02/28/2022 10:15:05 AM By: Fredirick Maudlin MD FACS Entered By: Fredirick Maudlin on 02/28/2022 10:15:04 -------------------------------------------------------------------------------- HxROS Details Patient Name: Date of Service: [name], Michelle NI L. 02/28/2022 9:00 A M Medical Record Number: [id number] Patient Account Number: [id number] Date of Birth/Sex: Treating RN: May 05, 1971 (51 y.o. Elam Dutch Primary Care Provider: Sandi Mariscal Other Clinician: Referring  Provider: Treating Provider/Extender: Nance Pew in Treatment: 0 Information Obtained From Patient Chart Constitutional Symptoms (General Health) Complaints and Symptoms: Positive for: Fatigue; Chills Medical History: Past Medical History Notes: obesity Eyes Complaints and Symptoms: Positive for: Glasses / Contacts - readers Medical History: Negative for: Cataracts; Glaucoma; Optic Neuritis Ear/Nose/Mouth/Throat Complaints and Symptoms: Negative for: Chronic sinus problems or rhinitis Respiratory Complaints and Symptoms: Negative for: Chronic or frequent coughs; Shortness of Breath Cardiovascular Complaints and Symptoms: Negative for: Chest pain Medical History: Positive for: Congestive Heart Failure Past Medical History Notes: hypercholesteremia Endocrine Complaints and Symptoms: Negative for: Heat/cold intolerance Medical History: Negative for: Type I Diabetes; Type II Diabetes Genitourinary Complaints and Symptoms: Negative for: Frequent urination Integumentary (Skin) Complaints and Symptoms: Positive for: Wounds - left lower leg Medical History: Negative for: History of Burn Musculoskeletal Complaints and Symptoms: Positive for: Muscle Pain; Muscle Weakness Medical History: Positive for: Rheumatoid Arthritis Neurologic Complaints and Symptoms: Positive for: Numbness/parasthesias - both feet Psychiatric Complaints and Symptoms: Negative for: Claustrophobia; Suicidal Medical History: Negative for: Anorexia/bulimia; Confinement Anxiety Hematologic/Lymphatic Medical History: Positive for: Lymphedema Gastrointestinal Medical History: Past Medical History Notes: bleeding hemorrhoids, diverticulosis,,eosinophilic esophagitis, IBS Immunological Oncologic Medical History: Negative for: Received Chemotherapy; Received Radiation Past Medical History Notes: skin CA removed Immunizations Pneumococcal Vaccine: Received Pneumococcal  Vaccination: No Implantable Devices No devices added Hospitalization / Surgery History Type of Hospitalization/Surgery left foot ORIF cholecystectomy ovarian cyst removed Family and Social History Cancer: Yes - Siblings,Mother,Paternal Grandparents,Maternal Grandparents; Diabetes: No; Heart Disease: Yes - Mother,Father; Hereditary Spherocytosis: No; Hypertension: Yes - Father,Siblings; Kidney Disease: No; Lung Disease: No; Seizures: No; Stroke: Yes - Father; Thyroid Problems: No; Tuberculosis: No; Current every day smoker - 5 cig per day; Marital Status - Divorced; Alcohol Use: Never; Drug Use: Prior History - TCH; Caffeine Use: Daily - coffee; Financial Concerns: No; Food, Clothing or Shelter Needs: No; Support System Lacking: No; Transportation Concerns: Yes - uses [employer]) Signed: 02/28/2022 11:38:54 AM By: Fredirick Maudlin MD FACS Signed: 02/28/2022 5:55:38 PM By: Baruch Gouty RN, BSN Entered By: Baruch Gouty on 02/28/2022 09:32:42 -------------------------------------------------------------------------------- [place] Details Patient Name: Date of Service: [name], Michelle Stall NI L. 02/28/2022 Medical Record Number: [id number] Patient Account Number: [id number] Date of Birth/Sex: Treating RN: 03-20-1971 (51 y.o. 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

## 2022-02-28 NOTE — Progress Notes (Signed)
CHERYLL, KEISLER ([id number]) Visit Report for 02/28/2022 Abuse Risk Screen Details Patient Name: Date of Service: Midge Aver NI L. 02/28/2022 9:00 A M Medical Record Number: [id number] Patient Account Number: [id number] Date of Birth/Sex: Treating RN: 25-Oct-1970 (51 y.o. Elam Dutch Primary Care August Gosser: Sandi Mariscal Other Clinician: Referring Jilliane Kazanjian: Treating Jassiel Flye/Extender: Nance Pew in Treatment: 0 Abuse Risk Screen Items Answer ABUSE RISK SCREEN: Has anyone close to you tried to hurt or harm you recentlyo No Do you feel uncomfortable with anyone in your familyo No Has anyone forced you do things that you didnt want to doo No Electronic Signature(s) Signed: 02/28/2022 5:55:38 PM By: Baruch Gouty RN, BSN Entered By: Baruch Gouty on 02/28/2022 09:36:00 -------------------------------------------------------------------------------- Activities of Daily Living Details Patient Name: Date of Service: Midge Aver NI L. 02/28/2022 9:00 A M Medical Record Number: [id number] Patient Account Number: [id number] Date of Birth/Sex: Treating RN: 02-14-1971 (51 y.o. Elam Dutch Primary Care Craig Wisnewski: Sandi Mariscal Other Clinician: Referring Mithcell Schumpert: Treating Patrisha Hausmann/Extender: Nance Pew in Treatment: 0 Activities of Daily Living Items Answer Activities of Daily Living (Please select one for each item) Drive Automobile Not Able T Medications ake Completely Able Use T elephone Completely Able Care for Appearance Completely Able Use T oilet Completely Able Bath / Shower Completely Able Dress Self Completely Able Feed Self Completely Able Walk Completely Able Get In / Out Bed Completely Able Housework Completely Able Prepare Meals Completely [place] for Self Need Assistance Electronic Signature(s) Signed: 02/28/2022 5:55:38 PM By: Baruch Gouty RN, BSN Entered By: Baruch Gouty on  02/28/2022 09:36:52 -------------------------------------------------------------------------------- Education Screening Details Patient Name: Date of Service: [name], [name] 02/28/2022 9:00 A M Medical Record Number: [id number] Patient Account Number: [id number] Date of Birth/Sex: Treating RN: 06/06/1971 (51 y.o. Elam Dutch Primary Care Milcah Dulany: Sandi Mariscal Other Clinician: Referring Bion Todorov: Treating Naquita Nappier/Extender: Nance Pew in Treatment: 0 Primary Learner Assessed: Patient Learning Preferences/Education Level/Primary Language Learning Preference: Explanation, Demonstration, Printed Material Highest Education Level: College or Above Preferred Language: English Cognitive Barrier Language Barrier: No Translator Needed: No Memory Deficit: No Emotional Barrier: No Cultural/Religious Beliefs Affecting Medical Care: No Physical Barrier Impaired Vision: Yes Glasses Impaired Hearing: No Decreased Hand dexterity: No Knowledge/Comprehension Knowledge Level: High Comprehension Level: High Ability to understand written instructions: High Ability to understand verbal instructions: High Motivation Anxiety Level: Calm Cooperation: Cooperative Education Importance: Acknowledges Need Interest in Health Problems: Asks Questions Perception: Coherent Willingness to Engage in Self-Management High Activities: Readiness to Engage in Self-Management High Activities: Electronic Signature(s) Signed: 02/28/2022 5:55:38 PM By: Baruch Gouty RN, BSN Entered By: Baruch Gouty on 02/28/2022 09:37:18 -------------------------------------------------------------------------------- Fall Risk Assessment Details Patient Name: Date of Service: [name], [name] 02/28/2022 9:00 A M Medical Record Number: [id number] Patient Account Number: [id number] Date of Birth/Sex: Treating RN: August 30, 1970 (51 y.o. Martyn Malay, Vaughan Basta Primary Care Vickee Mormino: Sandi Mariscal Other  Clinician: Referring Benito Lemmerman: Treating Charlottie Peragine/Extender: Nance Pew in Treatment: 0 Fall Risk Assessment Items Have you had 2 or more falls in the last 12 monthso 0 Yes Have you had any fall that resulted in injury in the last 12 monthso 0 No FALLS RISK SCREEN History of falling - immediate or within 3 months 25 Yes Secondary diagnosis (Do you have 2 or more medical diagnoseso) 0 No Ambulatory aid None/bed rest/wheelchair/nurse 0 Yes Crutches/cane/walker 0 No Furniture 0 No Intravenous therapy Access/Saline/Heparin Lock 0 No Gait/Transferring  Normal/ bed rest/ wheelchair 0 Yes Weak (short steps with or without shuffle, stooped but able to lift head while walking, may seek 0 No support from furniture) Impaired (short steps with shuffle, may have difficulty arising from chair, head down, impaired 20 Yes balance) Mental Status Oriented to own ability 0 No Electronic Signature(s) Signed: 02/28/2022 5:55:38 PM By: Baruch Gouty RN, BSN Entered By: Baruch Gouty on 02/28/2022 09:38:01 -------------------------------------------------------------------------------- Foot Assessment Details Patient Name: Date of Service: [name], [name] 02/28/2022 9:00 A M Medical Record Number: [id number] Patient Account Number: [id number] Date of Birth/Sex: Treating RN: [birth year] (50 y.o. Elam Dutch Primary Care Krina Mraz: Sandi Mariscal Other Clinician: Referring Jupiter Boys: Treating Tiphany Fayson/Extender: Burgess Amor Weeks in Treatment: 0 Foot Assessment Items Site Locations + = Sensation present, - = Sensation absent, C = Callus, U = Ulcer R = Redness, W = Warmth, M = Maceration, PU = Pre-ulcerative lesion F = Fissure, S = Swelling, D = Dryness Assessment Right: Left: Other Deformity: No No Prior Foot Ulcer: No No Prior Amputation: No No Charcot Joint: No No Ambulatory Status: Ambulatory Without Help Gait: Steady Electronic Signature(s) Signed:  02/28/2022 5:55:38 PM By: Baruch Gouty RN, BSN Entered By: Baruch Gouty on 02/28/2022 09:40:29 -------------------------------------------------------------------------------- Nutrition Risk Screening Details Patient Name: Date of Service: [name], [name] 02/28/2022 9:00 A M Medical Record Number: [id number] Patient Account Number: [id number] Date of Birth/Sex: Treating RN: 13-Oct-1970 (51 y.o. Martyn Malay, Vaughan Basta Primary Care Briteny Fulghum: Sandi Mariscal Other Clinician: Referring Aileana Hodder: Treating Sosie Gato/Extender: Burgess Amor Weeks in Treatment: 0 Height (in): 60 Weight (lbs): 178 Body Mass Index (BMI): 34.8 Nutrition Risk Screening Items Score Screening NUTRITION RISK SCREEN: I have an illness or condition that made me change the kind and/or amount of food I eat 0 No I eat fewer than two meals per day 3 Yes I eat few fruits and vegetables, or milk products 0 No I have three or more drinks of beer, liquor or wine almost every day 0 No I have tooth or mouth problems that make it hard for me to eat 0 No I don't always have enough money to buy the food I need 0 No I eat alone most of the time 1 Yes I take three or more different prescribed or over-the-counter drugs a day 1 Yes Without wanting to, I have lost or gained 10 pounds in the last six months 2 Yes I am not always physically able to shop, cook and/or feed myself 0 No Nutrition Protocols Good Risk Protocol Moderate Risk Protocol High Risk Proctocol 0 Provide education on nutrition Risk Level: High Risk Score: 7 Electronic Signature(s) Signed: 02/28/2022 5:55:38 PM By: Baruch Gouty RN, BSN Entered By: Baruch Gouty on 02/28/2022 09:40:16

## 2022-02-28 NOTE — Progress Notes (Signed)
KRISLYN, DONNAN ([id number]) Visit Report for 02/28/2022 Allergy List Details Patient Name: Date of Service: Michelle Aver NI L. 02/28/2022 9:00 A M Medical Record Number: [id number] Patient Account Number: [id number] Date of Birth/Sex: Treating RN: Apr 23, 1971 (51 y.o. [name] Primary Care Kimbree Casanas: [name] Other Clinician: Referring Zyanya Glaza: Treating Thanh Mottern/Extender: Burgess Amor Weeks in Treatment: 0 Allergies Active Allergies codeine Reaction: nausea/vomiting Prozac Reaction: aggressive behavior Allergy Notes Electronic Signature(s) Signed: 02/28/2022 5:55:38 PM By: Baruch Gouty RN, BSN Entered By: Baruch Gouty on 02/28/2022 09:21:01 -------------------------------------------------------------------------------- Arrival Information Details Patient Name: Date of Service: [name], Michelle Stall NI L. 02/28/2022 9:00 A M Medical Record Number: [id number] Patient Account Number: [id number] Date of Birth/Sex: Treating RN: October 09, 1970 (51 y.o. [name] Primary Care Victormanuel Mclure: [name] Other Clinician: Referring Deairra Halleck: Treating Uthman Mroczkowski/Extender: Nance Pew in Treatment: 0 Visit Information Patient Arrived: Ambulatory Arrival Time: 09:05 Accompanied By: self Transfer Assistance: None Patient Identification Verified: Yes Secondary Verification Process Completed: Yes Patient Requires Transmission-Based Precautions: No Patient Has Alerts: Yes Patient Alerts: R ABI= .96 L ABI = 1.94 Electronic Signature(s) Signed: 02/28/2022 5:55:38 PM By: Baruch Gouty RN, BSN Entered By: Baruch Gouty on 02/28/2022 09:54:17 -------------------------------------------------------------------------------- Compression Therapy Details Patient Name: Date of Service: [name], Michelle Stall NI L. 02/28/2022 9:00 A M Medical Record Number: [id number] Patient Account Number: [id number] Date of Birth/Sex: Treating RN: 02-05-1971 (51 y.o. [name] Primary Care Mckynlee Luse: [name] Other Clinician: Referring Manases Etchison: Treating Esthefany Herrig/Extender: Nance Pew in Treatment: 0 Compression Therapy Performed for Wound Assessment: Wound #1 Left,Posterior Lower Leg Performed By: Clinician Baruch Gouty, RN Compression Type: Four Layer Post Procedure Diagnosis Same as Pre-procedure Electronic Signature(s) Signed: 02/28/2022 5:55:38 PM By: Baruch Gouty RN, BSN Entered By: Baruch Gouty on 02/28/2022 10:00:29 -------------------------------------------------------------------------------- Lower Extremity Assessment Details Patient Name: Date of Service: [name], Michelle Stall NI L. 02/28/2022 9:00 A M Medical Record Number: [id number] Patient Account Number: [id number] Date of Birth/Sex: Treating RN: 1970-12-26 (51 y.o. [name] Primary Care Alletta Mattos: [name] Other Clinician: Referring Latoia Eyster: Treating Cherisse Carrell/Extender: Burgess Amor Weeks in Treatment: 0 Edema Assessment Assessed: [name]: No] [Right: No] Edema: [Left: Yes] [Right: Yes] Calf Left: Right: Point of Measurement: From Medial Instep 47.5 cm 47.5 cm Ankle Left: Right: Point of Measurement: From Medial Instep 26 cm 25 cm Knee To Floor Left: Right: From Medial Instep 37 cm 37 cm Vascular Assessment Pulses: Dorsalis Pedis Palpable: [Left:Yes] [Right:Yes] Electronic Signature(s) Signed: 02/28/2022 5:55:38 PM By: Baruch Gouty RN, BSN Entered By: Baruch Gouty on 02/28/2022 09:18:36 -------------------------------------------------------------------------------- Multi Wound Chart Details Patient Name: Date of Service: [name], Michelle Stall NI L. 02/28/2022 9:00 A M Medical Record Number: [id number] Patient Account Number: [id number] Date of Birth/Sex: Treating RN: April 17, 1971 (51 y.o. [name] Primary Care Gauri Galvao: [name] Other Clinician: Referring Ryne Mctigue: Treating Eragon Hammond/Extender: Burgess Amor Weeks in Treatment: 0 Vital Signs Height(in): 60 Pulse(bpm): 24 Weight(lbs): 178 Blood Pressure(mmHg): 125/84 Body Mass Index(BMI): 34.8 Temperature(F): 97.8 Respiratory Rate(breaths/min): 18 Photos: [N/A:N/A] Left, Posterior Lower Leg Left, Anterior Lower Leg N/A Wound Location: Blister Blister N/A Wounding Event: Lymphedema Lymphedema N/A Primary Etiology: Lymphedema, Congestive Heart Lymphedema, Congestive Heart N/A Comorbid History: Failure, Rheumatoid Arthritis Failure, Rheumatoid Arthritis 01/27/2022 01/27/2022 N/A Date Acquired: 0 0 N/A Weeks of Treatment: Open Open N/A Wound Status: No No N/A Wound Recurrence: 0.1x0.1x0.1 1.4x5.3x0.1 N/A Measurements L x W x D (cm) 0.008 5.828 N/A A (cm) : rea 0.001 0.583 N/A  Volume (cm) : 0.00% 0.00% N/A % Reduction in A rea: 0.00% 0.00% N/A % Reduction in Volume: Partial Thickness Partial Thickness N/A Classification: Medium None Present N/A Exudate A mount: Serous N/A N/A Exudate Type: amber N/A N/A Exudate Color: Indistinct, nonvisible Indistinct, nonvisible N/A Wound Margin: None Present (0%) None Present (0%) N/A Granulation A mount: None Present (0%) None Present (0%) N/A Necrotic A mount: Fascia: No Fascia: No N/A Exposed Structures: Fat Layer (Subcutaneous Tissue): No Fat Layer (Subcutaneous Tissue): No Tendon: No Tendon: No Muscle: No Muscle: No Joint: No Joint: No Bone: No Bone: No Limited to Skin Breakdown Limited to Skin Breakdown Large (67-100%) Large (67-100%) N/A Epithelialization: blisters blister N/A Assessment Notes: Compression Therapy N/A N/A Procedures Performed: Treatment Notes Electronic Signature(s) Signed: 02/28/2022 10:05:23 AM By: Fredirick Maudlin MD FACS Signed: 02/28/2022 5:55:38 PM By: Baruch Gouty RN, BSN Entered By: Fredirick Maudlin on 02/28/2022 10:05:23 -------------------------------------------------------------------------------- Pain Assessment  Details Patient Name: Date of Service: [name], Michelle Stall NI L. 02/28/2022 9:00 A M Medical Record Number: [id number] Patient Account Number: [id number] Date of Birth/Sex: Treating RN: [birth year] (51 y.o. [name] Primary Care [name]: [name] Other Clinician: Referring Ashunti Schofield: Treating [name]/Extender: Burgess Amor Weeks in Treatment: 0 Active Problems Location of Pain Severity and Description of Pain Patient Has Paino Yes Site Locations Pain Location: Generalized Pain Duration of the Pain. Constant / Intermittento Intermittent Rate the pain. Current Pain Level: 2 Worst Pain Level: 0 Character of Pain Describe the Pain: Other: uncomfprtable Pain Management and Medication Current Pain Management: Medication: Yes Is the Current Pain Management Adequate: Adequate How does your wound impact your activities of daily livingo Sleep: Yes Bathing: No Appetite: No Relationship With Others: No Bladder Continence: No Emotions: No Bowel Continence: No Work: Yes Toileting: No Drive: No Dressing: No Hobbies: No Engineer, maintenance) Signed: 02/28/2022 5:55:38 PM By: Baruch Gouty RN, BSN Entered By: Baruch Gouty on 02/28/2022 09:48:04 -------------------------------------------------------------------------------- Patient/Caregiver Education Details Patient Name: Date of Service: Michelle Aver NI [name] 7/3/2023andnbsp9:00 A M Medical Record Number: [id number] Patient Account Number: [id number] Date of Birth/Gender: Treating RN: [birth year] (51 y.o. [name] Primary Care Physician: [name] Other Clinician: Referring Physician: Treating Physician/Extender: Nance Pew in Treatment: 0 Education Assessment Education Provided To: Patient Education Topics Provided Venous: Handouts: Controlling Swelling with Multilayered Compression Wraps Methods: Explain/Verbal, Printed Responses: Reinforcements needed, State  content correctly [place]: o Handouts: Welcome T [facility] [place] o Methods: Explain/Verbal, Printed Responses: Reinforcements needed, State content correctly Wound/Skin Impairment: Methods: Explain/Verbal Responses: Reinforcements needed, State content correctly Electronic Signature(s) Signed: 02/28/2022 5:55:38 PM By: Baruch Gouty RN, BSN Entered By: Baruch Gouty on 02/28/2022 10:01:36 -------------------------------------------------------------------------------- Wound Assessment Details Patient Name: Date of Service: [name], Michelle Stall NI L. 02/28/2022 9:00 A M Medical Record Number: [id number] Patient Account Number: [id number] Date of Birth/Sex: Treating RN: Oct 31, 1970 (51 y.o. Martyn Malay, Vaughan Basta Primary Care Jessejames Steelman: [name] Other Clinician: Referring Raymond Bhardwaj: Treating Arnie Maiolo/Extender: Burgess Amor Weeks in Treatment: 0 Wound Status Wound Number: 1 Primary Etiology: Lymphedema Wound Location: Left, Posterior Lower Leg Wound Status: Open Wounding Event: Blister Comorbid Lymphedema, Congestive Heart Failure, Rheumatoid History: Arthritis Date Acquired: 01/27/2022 Weeks Of Treatment: 0 Clustered Wound: No Photos Wound Measurements Length: (cm) 0.1 Width: (cm) 0.1 Depth: (cm) 0.1 Area: (cm) 0.008 Volume: (cm) 0.001 % Reduction in Area: 0% % Reduction in Volume: 0% Epithelialization: Large (67-100%) Tunneling: No Undermining: No Wound Description Classification: Partial Thickness Wound  Margin: Indistinct, nonvisible Exudate Amount: Medium Exudate Type: Serous Exudate Color: amber Foul Odor After Cleansing: No Slough/Fibrino No Wound Bed Granulation Amount: None Present (0%) Exposed Structure Necrotic Amount: None Present (0%) Fascia Exposed: No Fat Layer (Subcutaneous Tissue) Exposed: No Tendon Exposed: No Muscle Exposed: No Joint Exposed: No Bone Exposed: No Limited to Skin Breakdown Assessment  Notes blisters Electronic Signature(s) Signed: 02/28/2022 5:55:38 PM By: Baruch Gouty RN, BSN Entered By: Baruch Gouty on 02/28/2022 09:50:24 -------------------------------------------------------------------------------- Wound Assessment Details Patient Name: Date of Service: [name], Michelle Stall NI L. 02/28/2022 9:00 A M Medical Record Number: [id number] Patient Account Number: [id number] Date of Birth/Sex: Treating RN: [birth year] (51 y.o. Martyn Malay, Vaughan Basta Primary Care Vivianna Piccini: [name] Other Clinician: Referring Tymarion Everard: Treating Mohab Ashby/Extender: Burgess Amor Weeks in Treatment: 0 Wound Status Wound Number: 2 Primary Etiology: Lymphedema Wound Location: Left, Anterior Lower Leg Wound Status: Open Wounding Event: Blister Comorbid Lymphedema, Congestive Heart Failure, Rheumatoid History: Arthritis Date Acquired: 01/27/2022 Weeks Of Treatment: 0 Clustered Wound: No Photos Wound Measurements Length: (cm) 1.4 Width: (cm) 5.3 Depth: (cm) 0.1 Area: (cm) 5.828 Volume: (cm) 0.583 % Reduction in Area: 0% % Reduction in Volume: 0% Epithelialization: Large (67-100%) Tunneling: No Undermining: No Wound Description Classification: Partial Thickness Wound Margin: Indistinct, nonvisible Exudate Amount: None Present Wound Bed Granulation Amount: None Present (0%) Necrotic Amount: None Present (0%) Foul Odor After Cleansing: No Slough/Fibrino No Exposed Structure Fascia Exposed: No Fat Layer (Subcutaneous Tissue) Exposed: No Tendon Exposed: No Muscle Exposed: No Joint Exposed: No Bone Exposed: No Limited to Skin Breakdown Assessment Notes blister Electronic Signature(s) Signed: 02/28/2022 5:55:38 PM By: Baruch Gouty RN, BSN Entered By: Baruch Gouty on 02/28/2022 09:50:58 -------------------------------------------------------------------------------- [place] Details Patient Name: Date of Service: [name], [name] 02/28/2022 9:00 A M Medical Record  Number: [id number] Patient Account Number: [id number] Date of Birth/Sex: Treating RN: March 05, 1971 (51 y.o. [name] Primary Care Ezrah Dembeck: [name] Other Clinician: Referring Geddy [place]: Treating Lenoir Facchini/Extender: Nance Pew in Treatment: 0 Vital Signs Time Taken: 09:10 Temperature (F): 97.8 Height (in): 60 Pulse (bpm): 78 Source: Stated Respiratory Rate (breaths/min): 18 Weight (lbs): 178 Blood Pressure (mmHg): 125/84 Source: Stated Reference Range: 80 - 120 mg / dl Body Mass Index (BMI): 34.8 Electronic Signature(s) Signed: 02/28/2022 5:55:38 PM By: Baruch Gouty RN, BSN Entered By: Baruch Gouty on 02/28/2022 09:11:35

## 2022-03-02 ENCOUNTER — Ambulatory Visit: Payer: Medicare Other

## 2022-03-07 ENCOUNTER — Encounter (HOSPITAL_BASED_OUTPATIENT_CLINIC_OR_DEPARTMENT_OTHER): Payer: Medicare Other | Admitting: General Surgery

## 2022-03-08 ENCOUNTER — Encounter (HOSPITAL_BASED_OUTPATIENT_CLINIC_OR_DEPARTMENT_OTHER): Payer: Medicare Other | Admitting: General Surgery

## 2022-03-08 DIAGNOSIS — L97229 Non-pressure chronic ulcer of left calf with unspecified severity: Secondary | ICD-10-CM | POA: Diagnosis not present

## 2022-03-08 NOTE — Progress Notes (Signed)
Michelle, Walls ([id number]) Visit Report for 03/08/2022 Chief Complaint Document Details Patient Name: Date of Service: [name] NI L. 03/08/2022 2:00 PM Medical Record Number: [id number] Patient Account Number: [id number] Date of Birth/Sex: Treating RN: 11/15/1970 (51 y.o. Elam Dutch Primary Care Provider: Sandi Mariscal Other Clinician: Referring Provider: Treating Provider/Extender: Nance Pew in Treatment: 1 Information Obtained from: Patient Chief Complaint Patient presents to the wound care center due with non-wound condition(s)--severe lymphedema Electronic Signature(s) Signed: 03/08/2022 3:16:21 PM By: Fredirick Maudlin MD FACS Entered By: Fredirick Maudlin on 03/08/2022 15:16:21 -------------------------------------------------------------------------------- HPI Details Patient Name: Date of Service: [name], [name] 03/08/2022 2:00 PM Medical Record Number: [id number] Patient Account Number: [id number] Date of Birth/Sex: Treating RN: 09/22/1970 (51 y.o. Elam Dutch Primary Care Provider: Sandi Mariscal Other Clinician: Referring Provider: Treating Provider/Extender: Nance Pew in Treatment: 1 History of Present Illness HPI Description: ADMISSION 02/28/2022 This is a 51 year old woman with relevant past medical history significant for lower extremity edema and cellulitis who presents with blisters on her left anterior tibia and calf and skin changes consistent with chronic lymphedema. This has been present for several months and has not responded well to diuretics. Her legs are painful. She says that she has worn compression stockings in the past but has not done so for over a year. She does not have lymphedema pumps. Home health has been wrapping her legs, but I am not sure what dressing they are using. Arterial ultrasound done on the outside shows normal multiphasic flow bilaterally without evidence of occlusive  peripheral vascular disease. Venous reflux studies have not been done. She has had negative DVT scans. She is not diabetic. Both legs are red with 2+ nonpitting edema. The skin particularly on the left leg is very scaly and hard. She has 2 blisters on her anterior tibial surface and 1 on the posterior calf. No open wounds on either leg. 03/08/2022: Her blisters have healed. Electronic Signature(s) Signed: 03/08/2022 3:16:36 PM By: Fredirick Maudlin MD FACS Entered By: Fredirick Maudlin on 03/08/2022 15:16:36 -------------------------------------------------------------------------------- Physical Exam Details Patient Name: Date of Service: [name], [name] 03/08/2022 2:00 PM Medical Record Number: [id number] Patient Account Number: [id number] Date of Birth/Sex: Treating RN: 09-07-1970 (51 y.o. Elam Dutch Primary Care Provider: Sandi Mariscal Other Clinician: Referring Provider: Treating Provider/Extender: Burgess Amor Weeks in Treatment: 1 Constitutional . . . . No acute distress.[name] Respiratory Normal work of breathing on room air.. Notes 03/08/2022: Her blisters have healed. Electronic Signature(s) Signed: 03/08/2022 3:17:06 PM By: Fredirick Maudlin MD FACS Entered By: Fredirick Maudlin on 03/08/2022 15:17:06 -------------------------------------------------------------------------------- Physician Orders Details Patient Name: Date of Service: [name], [name] 03/08/2022 2:00 PM Medical Record Number: [id number] Patient Account Number: [id number] Date of Birth/Sex: Treating RN: Jun 23, 1971 (51 y.o. 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 Edema Control - Lymphedema / SCD / Other Elevate legs to the level of the heart or above for 30 minutes daily and/or when sitting, a frequency of: - throughout the day Avoid standing for long periods of time. Exercise regularly  Moisturize legs daily. Compression stocking or Garment 20-30 mm/Hg pressure to: - both legs daily [name] for wound care. - Museum/gallery conservator) Signed: 03/08/2022 4:27:02 PM By: Fredirick Maudlin MD FACS Entered By: Fredirick Maudlin on 03/08/2022 15:17:12 -------------------------------------------------------------------------------- Problem List Details Patient Name: Date of Service: [name], [name] 03/08/2022 2:00 PM Medical Record Number: [id number] Patient Account Number: [id number] Date of Birth/Sex: Treating RN: 07/25/1971 (51 y.o. 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 03/08/2022 2:00 PM Medical Record Number: [id number] Patient Account Number: [id number] Date of Birth/Sex: Treating RN: 1971-01-25 (51 y.o. Elam Dutch Primary Care Provider: Sandi Mariscal Other Clinician: Referring Provider: Treating Provider/Extender: Nance Pew in Treatment: 1 Subjective Chief Complaint Information obtained from Patient Patient presents to the wound care center due with non-wound condition(s)--severe lymphedema History of Present Illness (HPI) ADMISSION 02/28/2022 This is a 51 year old woman with relevant past medical history significant for lower extremity edema and cellulitis who presents with blisters on her left anterior tibia and calf and skin changes consistent with chronic lymphedema. This has been present for several months and has not responded well to diuretics. Her legs are painful. She says that she has worn compression stockings in the past but has not done so for over a year. She does not have lymphedema pumps. Home health has been wrapping her legs, but I am not sure what dressing they are using. Arterial ultrasound done on the outside shows normal multiphasic flow bilaterally without evidence of occlusive peripheral vascular disease. Venous reflux studies have not been done. She has had negative DVT scans. She is not diabetic. Both legs are red with 2+ nonpitting edema. The skin particularly on the left leg is very scaly and hard. She has 2 blisters on her anterior tibial surface and 1 on the posterior calf. No open wounds on either leg. 03/08/2022: Her blisters have healed. Patient History Information obtained from Patient, Chart. Family History Cancer - Siblings,Mother,Paternal Grandparents,Maternal Grandparents, Heart Disease - Mother,Father, Hypertension - Father,Siblings, Stroke - Father, No family history of Diabetes, Hereditary Spherocytosis, Kidney Disease, Lung Disease, Seizures, Thyroid Problems, Tuberculosis. 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 Medical History Eyes Denies history of Cataracts, Glaucoma, Optic Neuritis Hematologic/Lymphatic Patient has history of Lymphedema Cardiovascular Patient has history of Congestive Heart Failure Endocrine Denies history of Type I Diabetes, Type II Diabetes Integumentary (Skin) Denies history of History of Burn Musculoskeletal Patient has history of Rheumatoid Arthritis Oncologic Denies history of Received Chemotherapy, Received Radiation Psychiatric Denies history of Anorexia/bulimia, Confinement Anxiety Hospitalization/Surgery History - left foot ORIF. - cholecystectomy. - ovarian cyst removed. Medical A Surgical History Notes nd Constitutional Symptoms (General Health) obesity Cardiovascular hypercholesteremia Gastrointestinal bleeding hemorrhoids, diverticulosis,,eosinophilic esophagitis, IBS Oncologic skin CA removed Objective Constitutional No acute distress.. Vitals Time Taken: 2:42 PM, Height: 60  in, Weight: 178 lbs, BMI: 34.8, Temperature: 98.5 F, Pulse: 93 bpm, Respiratory Rate: 18 breaths/min, Blood Pressure: 137/85 mmHg. Respiratory Normal work of breathing on room air.. General Notes: 03/08/2022: Her blisters have healed. Integumentary (Hair, Skin) Wound #1 status is Open. Original cause of wound was Blister. The date acquired was: 01/27/2022. The wound has been in treatment 1 weeks. The wound is located on the Left,Posterior Lower Leg. The wound measures 0cm length x 0cm width x 0cm depth; 0cm^2 area and 0cm^3 volume. The wound is limited to skin breakdown. There is no tunneling or undermining noted. There is a small amount of serous drainage noted. The wound margin is indistinct and nonvisible. There is no granulation within the wound bed. There is no necrotic tissue within the wound bed. Wound #2 status is Open. Original cause of wound was Blister. The date acquired was: 01/27/2022. The wound has been in treatment 1 weeks. The wound is located on the  Left,Anterior Lower Leg. The wound measures 0cm length x 0cm width x 0cm depth; 0cm^2 area and 0cm^3 volume. The wound is limited to skin breakdown. There is no tunneling or undermining noted. There is a small amount of serous drainage noted. The wound margin is indistinct and nonvisible. There is no granulation within the wound bed. There is no necrotic tissue within the wound bed. 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 Edema Control - Lymphedema / SCD / Other: Elevate legs to the level of the heart or above for 30 minutes daily and/or when sitting, a frequency of: - throughout the day Avoid standing for long periods of time. Exercise regularly Moisturize legs daily. Compression stocking or Garment 20-30 mm/Hg pressure to: - both legs daily Home Health: [facility] for wound care. - [facility] 03/08/2022: Her blisters have healed. She was measured for compression stockings and given the information for [facility] in [place]. She said that she would get some from there but also stop by a local medical supply store to get some to wear home. She was encouraged to continue to wear them on a daily basis and elevate her legs is much as possible at home. We will see her on an as-needed basis. Electronic Signature(s) Signed: 03/08/2022 3:18:02 PM By: Fredirick Maudlin MD FACS Entered By: Fredirick Maudlin on 03/08/2022 15:18:02 -------------------------------------------------------------------------------- HxROS Details Patient Name: Date of Service: [name], [name] 03/08/2022 2:00 PM Medical Record Number: [id number] Patient Account Number: [id number] Date of Birth/Sex: Treating RN: Mar 30, 1971 (50 y.o. Elam Dutch Primary Care Provider: Sandi Mariscal Other Clinician: Referring Provider: Treating Provider/Extender: Nance Pew in Treatment: 1 Information Obtained From Patient Chart Constitutional Symptoms (General Health) Medical History: Past Medical History Notes: obesity Eyes Medical History: Negative for: Cataracts; Glaucoma; Optic Neuritis Hematologic/Lymphatic Medical History: Positive for: Lymphedema Cardiovascular Medical History: Positive for: Congestive Heart Failure Past Medical History Notes: hypercholesteremia Gastrointestinal Medical History: Past Medical History Notes: bleeding hemorrhoids, diverticulosis,,eosinophilic esophagitis, IBS Endocrine Medical History: Negative for: Type I Diabetes; Type II Diabetes Integumentary (Skin) Medical History: Negative for: History of Burn Musculoskeletal Medical History: Positive for: Rheumatoid Arthritis Oncologic Medical History: Negative for: Received Chemotherapy; Received Radiation Past Medical History Notes: skin CA removed Psychiatric Medical History: Negative for: Anorexia/bulimia; Confinement Anxiety Immunizations Pneumococcal Vaccine: Received Pneumococcal Vaccination: No  Implantable Devices No devices added Hospitalization / Surgery History Type of Hospitalization/Surgery left foot ORIF cholecystectomy ovarian cyst removed Family and Social History Cancer: Yes - Siblings,Mother,Paternal Grandparents,Maternal Grandparents; Diabetes: No; Heart Disease: Yes - Mother,Father; Hereditary Spherocytosis: No; Hypertension: Yes - Father,Siblings; Kidney Disease: No; Lung Disease: No; Seizures: No; Stroke: Yes - Father; Thyroid Problems: No; Tuberculosis: No; Current every day smoker - 5 cig per day; Marital Status - Divorced; Alcohol Use: Never; Drug Use: Prior History - TCH; Caffeine Use: Daily - coffee; Financial Concerns: No; Food, Clothing or Shelter Needs: No; Support System  Lacking: No; Transportation Concerns: Yes - uses Chief Strategy Officer) Signed: 03/08/2022 4:27:02 PM By: Fredirick Maudlin MD FACS Signed: 03/08/2022 6:01:19 PM By: Baruch Gouty RN, BSN Entered By: Fredirick Maudlin on 03/08/2022 15:16:41 -------------------------------------------------------------------------------- [place] Details Patient Name: Date of Service: [name], [name] 03/08/2022 Medical Record Number: [id number] Patient Account Number: [id number] Date of Birth/Sex: Treating RN: [birth year] (51 y.o. 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

## 2022-03-08 NOTE — Progress Notes (Signed)
PINKI, ROTTMAN ([id number]) Visit Report for 03/08/2022 Arrival Information Details Patient Name: Date of Service: Michelle Aver NI L. 03/08/2022 2:00 PM Medical Record Number: [id number] Patient Account Number: [id number] Date of Birth/Sex: Treating RN: [birth year] (51 y.o. Martyn Malay, Vaughan Basta Primary Care Cobe Viney: Sandi Mariscal Other Clinician: Referring Nolyn Eilert: Treating Priyal Musquiz/Extender: Nance Pew in Treatment: 1 Visit Information History Since Last Visit Added or deleted any medications: No Patient Arrived: Ambulatory Any new allergies or adverse reactions: No Arrival Time: 14:26 Had a fall or experienced change in No Accompanied By: couisin activities of daily living that may affect Transfer Assistance: None risk of falls: Patient Identification Verified: Yes Signs or symptoms of abuse/neglect since last visito No Secondary Verification Process Completed: Yes Hospitalized since last visit: No Patient Requires Transmission-Based Precautions: No Implantable device outside of the clinic excluding No Patient Has Alerts: Yes cellular tissue based products placed in the center Patient Alerts: R ABI= .96 since last visit: L ABI = 1.94 Has Dressing in Place as Prescribed: Yes Has Compression in Place as Prescribed: Yes Pain Present Now: Yes Electronic Signature(s) Signed: 03/08/2022 6:01:19 PM By: Baruch Gouty RN, BSN Entered By: Baruch Gouty on 03/08/2022 14:42:54 -------------------------------------------------------------------------------- Clinic Level of Care Assessment Details Patient Name: Date of Service: Michelle Aver NI L. 03/08/2022 2:00 PM Medical Record Number: [id number] Patient Account Number: [id number] Date of Birth/Sex: Treating RN: 01/22/1971 (51 y.o. Martyn Malay, Vaughan Basta Primary Care Elleana Stillson: Sandi Mariscal Other Clinician: Referring Elnoria Livingston: Treating Estefania Kamiya/Extender: Nance Pew in Treatment: 1 Clinic Level  of Care Assessment Items TOOL 4 Quantity Score '[]'$  - 0 Use when only an EandM is performed on FOLLOW-UP visit ASSESSMENTS - Nursing Assessment / Reassessment X- 1 10 Reassessment of Co-morbidities (includes updates in patient status) X- 1 5 Reassessment of Adherence to Treatment Plan ASSESSMENTS - Wound and Skin A ssessment / Reassessment '[]'$  - 0 Simple Wound Assessment / Reassessment - one wound X- 2 5 Complex Wound Assessment / Reassessment - multiple wounds '[]'$  - 0 Dermatologic / Skin Assessment (not related to wound area) ASSESSMENTS - Focused Assessment X- 2 5 Circumferential Edema Measurements - multi extremities '[]'$  - 0 Nutritional Assessment / Counseling / Intervention X- 1 5 Lower Extremity Assessment (monofilament, tuning fork, pulses) '[]'$  - 0 Peripheral Arterial Disease Assessment (using hand held doppler) ASSESSMENTS - Ostomy and/or Continence Assessment and Care '[]'$  - 0 Incontinence Assessment and Management '[]'$  - 0 Ostomy Care Assessment and Management (repouching, etc.) PROCESS - Coordination of Care X - Simple Patient / Family Education for ongoing care 1 15 '[]'$  - 0 Complex (extensive) Patient / Family Education for ongoing care X- 1 10 Staff obtains Programmer, systems, Records, T Results / Process Orders est X- 1 10 Staff telephones HHA, Nursing Homes / Clarify orders / etc '[]'$  - 0 Routine Transfer to another Facility (non-emergent condition) '[]'$  - 0 Routine Hospital Admission (non-emergent condition) '[]'$  - 0 New Admissions / Biomedical engineer / Ordering NPWT Apligraf, etc. , '[]'$  - 0 Emergency Hospital Admission (emergent condition) X- 1 10 Simple Discharge Coordination '[]'$  - 0 Complex (extensive) Discharge Coordination PROCESS - Special Needs '[]'$  - 0 Pediatric / Minor Patient Management '[]'$  - 0 Isolation Patient Management '[]'$  - 0 Hearing / Language / Visual special needs '[]'$  - 0 Assessment of Community assistance (transportation, D/C planning, etc.) '[]'$  -  0 Additional assistance / Altered mentation '[]'$  - 0 Support Surface(s) Assessment (bed, cushion, seat, etc.) INTERVENTIONS - Wound Cleansing / Measurement '[]'$  -  0 Simple Wound Cleansing - one wound X- 2 5 Complex Wound Cleansing - multiple wounds X- 1 5 Wound Imaging (photographs - any number of wounds) '[]'$  - 0 Wound Tracing (instead of photographs) '[]'$  - 0 Simple Wound Measurement - one wound X- 2 5 Complex Wound Measurement - multiple wounds INTERVENTIONS - Wound Dressings '[]'$  - 0 Small Wound Dressing one or multiple wounds '[]'$  - 0 Medium Wound Dressing one or multiple wounds '[]'$  - 0 Large Wound Dressing one or multiple wounds '[]'$  - 0 Application of Medications - topical '[]'$  - 0 Application of Medications - injection INTERVENTIONS - Miscellaneous '[]'$  - 0 External ear exam '[]'$  - 0 Specimen Collection (cultures, biopsies, blood, body fluids, etc.) '[]'$  - 0 Specimen(s) / Culture(s) sent or taken to Lab for analysis '[]'$  - 0 Patient Transfer (multiple staff / Civil Service fast streamer / Similar devices) '[]'$  - 0 Simple Staple / Suture removal (25 or less) '[]'$  - 0 Complex Staple / Suture removal (26 or more) '[]'$  - 0 Hypo / Hyperglycemic Management (close monitor of Blood Glucose) '[]'$  - 0 Ankle / Brachial Index (ABI) - do not check if billed separately X- 1 5 Vital Signs Has the patient been seen at the hospital within the last three years: Yes Total Score: 115 Level Of Care: New/Established - Level 3 Electronic Signature(s) Signed: 03/08/2022 6:01:19 PM By: Baruch Gouty RN, BSN Entered By: Baruch Gouty on 03/08/2022 15:12:17 -------------------------------------------------------------------------------- Encounter Discharge Information Details Patient Name: Date of Service: [name], Michelle NI L. 03/08/2022 2:00 PM Medical Record Number: [id number] Patient Account Number: [id number] Date of Birth/Sex: Treating RN: December 15, 1970 (51 y.o. Elam Dutch Primary Care Shundra Wirsing: Sandi Mariscal Other  Clinician: Referring Nera Haworth: Treating Shelly Spenser/Extender: Nance Pew in Treatment: 1 Encounter Discharge Information Items Discharge Condition: Stable Ambulatory Status: Ambulatory Discharge Destination: Home Transportation: Private Auto Accompanied By: cousin Schedule Follow-up Appointment: Yes Clinical Summary of Care: Patient Declined Electronic Signature(s) Signed: 03/08/2022 6:01:19 PM By: Baruch Gouty RN, BSN Entered By: Baruch Gouty on 03/08/2022 15:13:19 -------------------------------------------------------------------------------- Lower Extremity Assessment Details Patient Name: Date of Service: [name], Michelle Stall NI L. 03/08/2022 2:00 PM Medical Record Number: [id number] Patient Account Number: [id number] Date of Birth/Sex: Treating RN: 1971-07-28 (52 y.o. Elam Dutch Primary Care Mana Morison: Sandi Mariscal Other Clinician: Referring Jayleen Afonso: Treating Vivianne Carles/Extender: Burgess Amor Weeks in Treatment: 1 Edema Assessment Assessed: [Left: No] [Right: No] Edema: [Left: Yes] [Right: Yes] Calf Left: Right: Point of Measurement: From Medial Instep 39 cm 41.5 cm Ankle Left: Right: Point of Measurement: From Medial Instep 22.7 cm 25 cm Knee To Floor Left: Right: From Medial Instep 41 cm 41 cm Vascular Assessment Pulses: Dorsalis Pedis Palpable: [Left:Yes] [Right:Yes] Electronic Signature(s) Signed: 03/08/2022 6:01:19 PM By: Baruch Gouty RN, BSN Entered By: Baruch Gouty on 03/08/2022 15:00:42 -------------------------------------------------------------------------------- Multi Wound Chart Details Patient Name: Date of Service: [name], Michelle Stall NI L. 03/08/2022 2:00 PM Medical Record Number: [id number] Patient Account Number: [id number] Date of Birth/Sex: Treating RN: 02-Nov-1970 (51 y.o. Elam Dutch Primary Care Izzabell Klasen: Sandi Mariscal Other Clinician: Referring Rahiem Schellinger: Treating Igor Bishop/Extender: Burgess Amor Weeks in Treatment: 1 Vital Signs Height(in): 60 Pulse(bpm): 93 Weight(lbs): 178 Blood Pressure(mmHg): 137/85 Body Mass Index(BMI): 34.8 Temperature(F): 98.5 Respiratory Rate(breaths/min): 18 Photos: [N/A:N/A] Left, Posterior Lower Leg Left, Anterior Lower Leg N/A Wound Location: Blister Blister N/A Wounding Event: Lymphedema Lymphedema N/A Primary Etiology: Lymphedema, Congestive Heart Lymphedema, Congestive Heart N/A Comorbid History: Failure, Rheumatoid Arthritis Failure, Rheumatoid Arthritis 01/27/2022 01/27/2022 N/A Date  Acquired: 1 1 N/A Weeks of Treatment: Open Open N/A Wound Status: No No N/A Wound Recurrence: 0x0x0 0x0x0 N/A Measurements L x W x D (cm) 0 0 N/A A (cm) : rea 0 0 N/A Volume (cm) : 100.00% 100.00% N/A % Reduction in A rea: 100.00% 100.00% N/A % Reduction in Volume: Partial Thickness Partial Thickness N/A Classification: Small Small N/A Exudate A mount: Serous Serous N/A Exudate Type: amber amber N/A Exudate Color: Indistinct, nonvisible Indistinct, nonvisible N/A Wound Margin: None Present (0%) None Present (0%) N/A Granulation A mount: None Present (0%) None Present (0%) N/A Necrotic A mount: Fascia: No Fascia: No N/A Exposed Structures: Fat Layer (Subcutaneous Tissue): No Fat Layer (Subcutaneous Tissue): No Tendon: No Tendon: No Muscle: No Muscle: No Joint: No Joint: No Bone: No Bone: No Limited to Skin Breakdown Limited to Skin Breakdown Large (67-100%) Large (67-100%) N/A Epithelialization: Treatment Notes Wound #1 (Lower Leg) Wound Laterality: Left, Posterior Cleanser Peri-Wound Care Topical Primary Dressing Secondary Dressing Secured With Compression Wrap Compression Stockings Add-Ons Wound #2 (Lower Leg) Wound Laterality: Left, Anterior Cleanser Peri-Wound Care Topical Primary Dressing Secondary Dressing Secured With Compression Wrap Compression Stockings Add-Ons Electronic  Signature(s) Signed: 03/08/2022 3:16:15 PM By: Fredirick Maudlin MD FACS Signed: 03/08/2022 6:01:19 PM By: Baruch Gouty RN, BSN Entered By: Fredirick Maudlin on 03/08/2022 15:16:15 -------------------------------------------------------------------------------- Pain Assessment Details Patient Name: Date of Service: [name], Michelle Stall NI L. 03/08/2022 2:00 PM Medical Record Number: [id number] Patient Account Number: [id number] Date of Birth/Sex: Treating RN: 1970/11/27 (51 y.o. Elam Dutch Primary Care Maylynn Orzechowski: Sandi Mariscal Other Clinician: Referring Ulanda Tackett: Treating Daylen Hack/Extender: Nance Pew in Treatment: 1 Active Problems Location of Pain Severity and Description of Pain Patient Has Paino Yes Site Locations Pain Location: Pain Location: Pain in Ulcers With Dressing Change: Yes Duration of the Pain. Constant / Intermittento Constant Rate the pain. Current Pain Level: 4 Worst Pain Level: 6 Least Pain Level: 3 Character of Pain Describe the Pain: Aching, Burning Pain Management and Medication Current Pain Management: Medication: Yes Is the Current Pain Management Adequate: Adequate How does your wound impact your activities of daily livingo Sleep: Yes Bathing: No Appetite: No Relationship With Others: No Bladder Continence: No Emotions: No Bowel Continence: No Work: No Toileting: No Drive: No Dressing: No Hobbies: No Electronic Signature(s) Signed: 03/08/2022 6:01:19 PM By: Baruch Gouty RN, BSN Entered By: Baruch Gouty on 03/08/2022 14:46:50 -------------------------------------------------------------------------------- Patient/Caregiver Education Details Patient Name: Date of Service: Michelle Aver NI L. 7/11/2023andnbsp2:00 PM Medical Record Number: [id number] Patient Account Number: [id number] Date of Birth/Gender: Treating RN: [birth year] (51 y.o. Elam Dutch Primary Care Physician: Sandi Mariscal Other  Clinician: Referring Physician: Treating Physician/Extender: Nance Pew in Treatment: 1 Education Assessment Education Provided To: Patient Education Topics Provided Venous: Methods: Explain/Verbal Responses: Reinforcements needed, State content correctly Wound/Skin Impairment: Methods: Explain/Verbal Responses: Reinforcements needed, State content correctly Electronic Signature(s) Signed: 03/08/2022 6:01:19 PM By: Baruch Gouty RN, BSN Entered By: Baruch Gouty on 03/08/2022 15:01:22 -------------------------------------------------------------------------------- Wound Assessment Details Patient Name: Date of Service: [name], Michelle Stall NI L. 03/08/2022 2:00 PM Medical Record Number: [id number] Patient Account Number: [id number] Date of Birth/Sex: Treating RN: 13-Aug-1971 (51 y.o. Elam Dutch Primary Care Shaqueena Mauceri: Sandi Mariscal Other Clinician: Referring Lavante Toso: Treating Bernita Beckstrom/Extender: Burgess Amor Weeks in Treatment: 1 Wound Status Wound Number: 1 Primary Etiology: Lymphedema Wound Location: Left, Posterior Lower Leg Wound Status: Open Wounding Event: Blister Comorbid Lymphedema, Congestive Heart Failure, Rheumatoid History: Arthritis Date Acquired: 01/27/2022 [place]  Of Treatment: 1 Clustered Wound: No Photos Wound Measurements Length: (cm) Width: (cm) Depth: (cm) Area: (cm) Volume: (cm) 0 % Reduction in Area: 100% 0 % Reduction in Volume: 100% 0 Epithelialization: Large (67-100%) 0 Tunneling: No 0 Undermining: No Wound Description Classification: Partial Thickness Wound Margin: Indistinct, nonvisible Exudate Amount: Small Exudate Type: Serous Exudate Color: amber Foul Odor After Cleansing: No Slough/Fibrino No Wound Bed Granulation Amount: None Present (0%) Exposed Structure Necrotic Amount: None Present (0%) Fascia Exposed: No Fat Layer (Subcutaneous Tissue) Exposed: No Tendon Exposed: No Muscle Exposed:  No Joint Exposed: No Bone Exposed: No Limited to Skin Breakdown Electronic Signature(s) Signed: 03/08/2022 6:01:19 PM By: Baruch Gouty RN, BSN Entered By: Baruch Gouty on 03/08/2022 14:58:23 -------------------------------------------------------------------------------- Wound Assessment Details Patient Name: Date of Service: [name], Michelle Stall NI L. 03/08/2022 2:00 PM Medical Record Number: [id number] Patient Account Number: [id number] Date of Birth/Sex: Treating RN: 1970-10-10 (51 y.o. Martyn Malay, Vaughan Basta Primary Care Giavanni Zeitlin: Sandi Mariscal Other Clinician: Referring Kelliann Pendergraph: Treating Amaiyah Nordhoff/Extender: Burgess Amor Weeks in Treatment: 1 Wound Status Wound Number: 2 Primary Etiology: Lymphedema Wound Location: Left, Anterior Lower Leg Wound Status: Open Wounding Event: Blister Comorbid Lymphedema, Congestive Heart Failure, Rheumatoid History: Arthritis Date Acquired: 01/27/2022 Weeks Of Treatment: 1 Clustered Wound: No Photos Wound Measurements Length: (cm) Width: (cm) Depth: (cm) Area: (cm) Volume: (cm) 0 % Reduction in Area: 100% 0 % Reduction in Volume: 100% 0 Epithelialization: Large (67-100%) 0 Tunneling: No 0 Undermining: No Wound Description Classification: Partial Thickness Wound Margin: Indistinct, nonvisible Exudate Amount: Small Exudate Type: Serous Exudate Color: amber Foul Odor After Cleansing: No Slough/Fibrino No Wound Bed Granulation Amount: None Present (0%) Exposed Structure Necrotic Amount: None Present (0%) Fascia Exposed: No Fat Layer (Subcutaneous Tissue) Exposed: No Tendon Exposed: No Muscle Exposed: No Joint Exposed: No Bone Exposed: No Limited to Skin Breakdown Electronic Signature(s) Signed: 03/08/2022 6:01:19 PM By: Baruch Gouty RN, BSN Entered By: Baruch Gouty on 03/08/2022 14:59:12 -------------------------------------------------------------------------------- [place] Details Patient Name: Date of  Service: [name], Michelle NI L. 03/08/2022 2:00 PM Medical Record Number: [id number] Patient Account Number: [id number] Date of Birth/Sex: Treating RN: 08/17/1971 (51 y.o. Elam Dutch Primary Care Reem Fleury: Sandi Mariscal Other Clinician: Referring Jolean Madariaga: Treating Sumaiyah Markert/Extender: Burgess Amor Weeks in Treatment: 1 Vital Signs Time Taken: 14:42 Temperature (F): 98.5 Height (in): 60 Pulse (bpm): 93 Weight (lbs): 178 Respiratory Rate (breaths/min): 18 Body Mass Index (BMI): 34.8 Blood Pressure (mmHg): 137/85 Reference Range: 80 - 120 mg / dl Electronic Signature(s) Signed: 03/08/2022 6:01:19 PM By: Baruch Gouty RN, BSN Entered By: Baruch Gouty on 03/08/2022 14:43:20

## 2022-03-09 ENCOUNTER — Ambulatory Visit: Payer: Medicare Other

## 2022-03-30 ENCOUNTER — Ambulatory Visit: Payer: Medicare Other

## 2022-04-06 ENCOUNTER — Ambulatory Visit: Payer: Medicare Other

## 2022-04-22 ENCOUNTER — Ambulatory Visit
Admission: RE | Admit: 2022-04-22 | Discharge: 2022-04-22 | Disposition: A | Discharge status: Home / Self Care | Payer: Medicare Other | Source: Ambulatory Visit | Attending: Internal Medicine | Admitting: Internal Medicine

## 2022-04-22 DIAGNOSIS — Z1231 Encounter for screening mammogram for malignant neoplasm of breast: Secondary | ICD-10-CM

## 2022-05-16 ENCOUNTER — Encounter (HOSPITAL_COMMUNITY): Payer: Self-pay | Admitting: Emergency Medicine

## 2022-05-16 ENCOUNTER — Emergency Department (HOSPITAL_COMMUNITY): Payer: Medicare Other

## 2022-05-16 ENCOUNTER — Inpatient Hospital Stay (HOSPITAL_COMMUNITY)
Admission: EM | Admit: 2022-05-16 | Discharge: 2022-05-22 | DRG: 897 | Disposition: A | Discharge status: Home / Self Care | Payer: Medicare Other | Attending: Internal Medicine | Admitting: Internal Medicine

## 2022-05-16 DIAGNOSIS — Z85820 Personal history of malignant melanoma of skin: Secondary | ICD-10-CM

## 2022-05-16 DIAGNOSIS — K2990 Gastroduodenitis, unspecified, without bleeding: Secondary | ICD-10-CM | POA: Diagnosis present

## 2022-05-16 DIAGNOSIS — R45851 Suicidal ideations: Principal | ICD-10-CM

## 2022-05-16 DIAGNOSIS — M1990 Unspecified osteoarthritis, unspecified site: Secondary | ICD-10-CM | POA: Diagnosis present

## 2022-05-16 DIAGNOSIS — Z885 Allergy status to narcotic agent status: Secondary | ICD-10-CM

## 2022-05-16 DIAGNOSIS — F13239 Sedative, hypnotic or anxiolytic dependence with withdrawal, unspecified: Secondary | ICD-10-CM | POA: Diagnosis not present

## 2022-05-16 DIAGNOSIS — F1210 Cannabis abuse, uncomplicated: Secondary | ICD-10-CM | POA: Diagnosis present

## 2022-05-16 DIAGNOSIS — T402X6A Underdosing of other opioids, initial encounter: Secondary | ICD-10-CM | POA: Diagnosis present

## 2022-05-16 DIAGNOSIS — K219 Gastro-esophageal reflux disease without esophagitis: Secondary | ICD-10-CM | POA: Diagnosis present

## 2022-05-16 DIAGNOSIS — E860 Dehydration: Secondary | ICD-10-CM | POA: Diagnosis present

## 2022-05-16 DIAGNOSIS — Z6836 Body mass index (BMI) 36.0-36.9, adult: Secondary | ICD-10-CM

## 2022-05-16 DIAGNOSIS — F1123 Opioid dependence with withdrawal: Secondary | ICD-10-CM | POA: Diagnosis present

## 2022-05-16 DIAGNOSIS — Z79899 Other long term (current) drug therapy: Secondary | ICD-10-CM

## 2022-05-16 DIAGNOSIS — E876 Hypokalemia: Secondary | ICD-10-CM | POA: Diagnosis present

## 2022-05-16 DIAGNOSIS — Z91128 Patient's intentional underdosing of medication regimen for other reason: Secondary | ICD-10-CM

## 2022-05-16 DIAGNOSIS — F13939 Sedative, hypnotic or anxiolytic use, unspecified with withdrawal, unspecified: Secondary | ICD-10-CM | POA: Diagnosis present

## 2022-05-16 DIAGNOSIS — Z9151 Personal history of suicidal behavior: Secondary | ICD-10-CM

## 2022-05-16 DIAGNOSIS — K2970 Gastritis, unspecified, without bleeding: Secondary | ICD-10-CM | POA: Diagnosis present

## 2022-05-16 DIAGNOSIS — Z8616 Personal history of COVID-19: Secondary | ICD-10-CM

## 2022-05-16 DIAGNOSIS — F4310 Post-traumatic stress disorder, unspecified: Secondary | ICD-10-CM | POA: Diagnosis present

## 2022-05-16 DIAGNOSIS — F1120 Opioid dependence, uncomplicated: Secondary | ICD-10-CM | POA: Diagnosis present

## 2022-05-16 DIAGNOSIS — T424X6A Underdosing of benzodiazepines, initial encounter: Secondary | ICD-10-CM | POA: Diagnosis present

## 2022-05-16 DIAGNOSIS — Z8614 Personal history of Methicillin resistant Staphylococcus aureus infection: Secondary | ICD-10-CM

## 2022-05-16 DIAGNOSIS — R55 Syncope and collapse: Secondary | ICD-10-CM

## 2022-05-16 DIAGNOSIS — K580 Irritable bowel syndrome with diarrhea: Secondary | ICD-10-CM | POA: Diagnosis present

## 2022-05-16 DIAGNOSIS — K589 Irritable bowel syndrome without diarrhea: Secondary | ICD-10-CM

## 2022-05-16 DIAGNOSIS — R0789 Other chest pain: Secondary | ICD-10-CM | POA: Diagnosis not present

## 2022-05-16 DIAGNOSIS — I890 Lymphedema, not elsewhere classified: Secondary | ICD-10-CM

## 2022-05-16 DIAGNOSIS — F314 Bipolar disorder, current episode depressed, severe, without psychotic features: Secondary | ICD-10-CM | POA: Diagnosis present

## 2022-05-16 DIAGNOSIS — Z8249 Family history of ischemic heart disease and other diseases of the circulatory system: Secondary | ICD-10-CM

## 2022-05-16 DIAGNOSIS — F411 Generalized anxiety disorder: Secondary | ICD-10-CM | POA: Diagnosis present

## 2022-05-16 DIAGNOSIS — Z9049 Acquired absence of other specified parts of digestive tract: Secondary | ICD-10-CM

## 2022-05-16 DIAGNOSIS — F17210 Nicotine dependence, cigarettes, uncomplicated: Secondary | ICD-10-CM | POA: Diagnosis present

## 2022-05-16 DIAGNOSIS — Z91148 Patient's other noncompliance with medication regimen for other reason: Secondary | ICD-10-CM

## 2022-05-16 LAB — CBC WITH DIFFERENTIAL/PLATELET
Abs Immature Granulocytes: 0.06 10*3/uL (ref 0.00–0.07)
Basophils Absolute: 0 10*3/uL (ref 0.0–0.1)
Basophils Relative: 0 %
Eosinophils Absolute: 0 10*3/uL (ref 0.0–0.5)
Eosinophils Relative: 0 %
HCT: 47.8 % — ABNORMAL HIGH (ref 36.0–46.0)
Hemoglobin: 16.1 g/dL — ABNORMAL HIGH (ref 12.0–15.0)
Immature Granulocytes: 1 %
Lymphocytes Relative: 17 %
Lymphs Abs: 2.1 10*3/uL (ref 0.7–4.0)
MCH: 31.9 pg (ref 26.0–34.0)
MCHC: 33.7 g/dL (ref 30.0–36.0)
MCV: 94.7 fL (ref 80.0–100.0)
Monocytes Absolute: 1 10*3/uL (ref 0.1–1.0)
Monocytes Relative: 7 %
Neutro Abs: 9.7 10*3/uL — ABNORMAL HIGH (ref 1.7–7.7)
Neutrophils Relative %: 75 %
Platelets: 366 10*3/uL (ref 150–400)
RBC: 5.05 MIL/uL (ref 3.87–5.11)
RDW: 13.1 % (ref 11.5–15.5)
WBC: 12.9 10*3/uL — ABNORMAL HIGH (ref 4.0–10.5)
nRBC: 0 % (ref 0.0–0.2)

## 2022-05-16 LAB — COMPREHENSIVE METABOLIC PANEL
ALT: 14 U/L (ref 0–44)
AST: 15 U/L (ref 15–41)
Albumin: 4.2 g/dL (ref 3.5–5.0)
Alkaline Phosphatase: 60 U/L (ref 38–126)
Anion gap: 11 (ref 5–15)
BUN: 8 mg/dL (ref 6–20)
CO2: 20 mmol/L — ABNORMAL LOW (ref 22–32)
Calcium: 9.8 mg/dL (ref 8.9–10.3)
Chloride: 110 mmol/L (ref 98–111)
Creatinine, Ser: 0.87 mg/dL (ref 0.44–1.00)
GFR, Estimated: >60 mL/min (ref >60)
Glucose, Bld: 117 mg/dL — ABNORMAL HIGH (ref 70–99)
Potassium: 4.3 mmol/L (ref 3.5–5.1)
Sodium: 141 mmol/L (ref 135–145)
Total Bilirubin: 0.6 mg/dL (ref 0.3–1.2)
Total Protein: 7.9 g/dL (ref 6.5–8.1)

## 2022-05-16 LAB — URINALYSIS, ROUTINE W REFLEX MICROSCOPIC
Bilirubin Urine: NEGATIVE
Glucose, UA: NEGATIVE mg/dL
Hgb urine dipstick: NEGATIVE
Ketones, ur: 20 mg/dL — AB
Leukocytes,Ua: NEGATIVE
Nitrite: NEGATIVE
Protein, ur: 30 mg/dL — AB
Specific Gravity, Urine: 1.02 (ref 1.005–1.030)
pH: 5 (ref 5.0–8.0)

## 2022-05-16 LAB — RAPID URINE DRUG SCREEN, HOSP PERFORMED
Amphetamines: NOT DETECTED
Barbiturates: NOT DETECTED
Benzodiazepines: NOT DETECTED
Cocaine: NOT DETECTED
Opiates: NOT DETECTED
Tetrahydrocannabinol: POSITIVE — AB

## 2022-05-16 NOTE — ED Notes (Signed)
Dr Reather Converse cleared neck at bridge. C-collar removed, patient taken to triage.

## 2022-05-16 NOTE — ED Triage Notes (Signed)
RN spoke w/ pt who began crying w/ person on the phone.  She was not found in the bathroom, found in the den per family member.  Pt is reporting suicidal thoughts w/ no direct plan but has hx of several attempts.  Has been off medications Since last Tuesday.

## 2022-05-16 NOTE — ED Provider Triage Note (Signed)
Emergency Medicine Provider Triage Evaluation Note  [name] , a 51 y.o. female  was evaluated in triage.  Pt complains of syncope.  Has been off of all of her home meds and psych meds.  She went to take a shower and  doesn't remember what happened. EMS found her on the bathroom floor. She does not remember how she got there  Review of Systems  Positive: syncope Negative:   Physical Exam  LMP 06/14/2012  Gen:   Awake, no distress   Resp:  Normal effort  MSK:   Moves extremities without difficulty  Other:    Medical Decision Making  Medically screening exam initiated at 8:38 PM.  Appropriate orders placed.  [name] was informed that the remainder of the evaluation will be completed by another provider, this initial triage assessment does not replace that evaluation, and the importance of remaining in the ED until their evaluation is complete.  syncope   Shandy Vi A, PA-C 05/16/22 2038

## 2022-05-16 NOTE — ED Triage Notes (Addendum)
Per EMS, from home stopped taking her psy and pain meds a week ago. Generalized weakness, not eating.  Attempted to take shower, syncopal episode in bathroom while on the phone (who called EMS), hit back of head on floor.  Was placed in C-collar, cleared at [facility] by EDP, removed.  Lower bilateral Edema.    HR 110 Pb 138/90 20G L AC - '4mg'$  of zofran CBG 90   Pt states she did not fall, does not remember situation.  Says she was in the living room, she was not according to EMS.

## 2022-05-17 ENCOUNTER — Other Ambulatory Visit: Payer: Self-pay

## 2022-05-17 ENCOUNTER — Encounter (HOSPITAL_COMMUNITY): Payer: Self-pay | Admitting: Family Medicine

## 2022-05-17 ENCOUNTER — Emergency Department (HOSPITAL_COMMUNITY): Payer: Medicare Other

## 2022-05-17 DIAGNOSIS — R55 Syncope and collapse: Secondary | ICD-10-CM

## 2022-05-17 DIAGNOSIS — I890 Lymphedema, not elsewhere classified: Secondary | ICD-10-CM

## 2022-05-17 DIAGNOSIS — F13939 Sedative, hypnotic or anxiolytic use, unspecified with withdrawal, unspecified: Secondary | ICD-10-CM | POA: Diagnosis present

## 2022-05-17 LAB — CK: Total CK: 69 U/L (ref 38–234)

## 2022-05-17 LAB — ACETAMINOPHEN LEVEL: Acetaminophen (Tylenol), Serum: <10 ug/mL — ABNORMAL LOW (ref 10–30)

## 2022-05-17 LAB — TROPONIN I (HIGH SENSITIVITY)
Troponin I (High Sensitivity): 3 ng/L (ref <18)
Troponin I (High Sensitivity): 3 ng/L (ref <18)

## 2022-05-17 LAB — HIV ANTIBODY (ROUTINE TESTING W REFLEX): HIV Screen 4th Generation wRfx: NONREACTIVE

## 2022-05-17 LAB — BRAIN NATRIURETIC PEPTIDE: B Natriuretic Peptide: 20 pg/mL (ref 0.0–100.0)

## 2022-05-17 LAB — ETHANOL: Alcohol, Ethyl (B): <10 mg/dL (ref <10)

## 2022-05-17 LAB — TSH: TSH: 0.391 u[IU]/mL (ref 0.350–4.500)

## 2022-05-17 LAB — SALICYLATE LEVEL: Salicylate Lvl: <7 mg/dL — ABNORMAL LOW (ref 7.0–30.0)

## 2022-05-17 LAB — MAGNESIUM: Magnesium: 2 mg/dL (ref 1.7–2.4)

## 2022-05-17 MED ORDER — GABAPENTIN 600 MG PO TABS
600.0000 mg | ORAL_TABLET | Freq: Three times a day (TID) | ORAL | Status: DC
  Administered 2022-05-17 – 2022-05-22 (×15): 600 mg via ORAL
  Filled 2022-05-17 (×17): qty 1

## 2022-05-17 MED ORDER — ONDANSETRON HCL 4 MG/2ML IJ SOLN
4.0000 mg | Freq: Four times a day (QID) | INTRAMUSCULAR | Status: DC | PRN
  Administered 2022-05-18 – 2022-05-20 (×7): 4 mg via INTRAVENOUS
  Filled 2022-05-17 (×8): qty 2

## 2022-05-17 MED ORDER — SODIUM CHLORIDE 0.9 % IV SOLN: INTRAVENOUS | Status: DC

## 2022-05-17 MED ORDER — MIRTAZAPINE 15 MG PO TABS
15.0000 mg | ORAL_TABLET | Freq: Every day | ORAL | Status: DC
  Administered 2022-05-17 – 2022-05-21 (×5): 15 mg via ORAL
  Filled 2022-05-17 (×5): qty 1

## 2022-05-17 MED ORDER — LOPERAMIDE HCL 2 MG PO CAPS
2.0000 mg | ORAL_CAPSULE | Freq: Two times a day (BID) | ORAL | Status: DC | PRN
  Administered 2022-05-20 (×2): 2 mg via ORAL
  Filled 2022-05-17 (×2): qty 1

## 2022-05-17 MED ORDER — ONDANSETRON 4 MG PO TBDP
4.0000 mg | ORAL_TABLET | Freq: Once | ORAL | Status: AC
  Administered 2022-05-17: 4 mg via ORAL
  Filled 2022-05-17: qty 1

## 2022-05-17 MED ORDER — ACETAMINOPHEN 650 MG RE SUPP: 650.0000 mg | Freq: Four times a day (QID) | RECTAL | Status: DC | PRN

## 2022-05-17 MED ORDER — PRAZOSIN HCL 2 MG PO CAPS
2.0000 mg | ORAL_CAPSULE | Freq: Every day | ORAL | Status: DC
  Administered 2022-05-18 – 2022-05-21 (×5): 2 mg via ORAL
  Filled 2022-05-17 (×8): qty 1

## 2022-05-17 MED ORDER — ONDANSETRON HCL 4 MG PO TABS
4.0000 mg | ORAL_TABLET | Freq: Four times a day (QID) | ORAL | Status: DC | PRN
  Administered 2022-05-17 – 2022-05-22 (×3): 4 mg via ORAL
  Filled 2022-05-17 (×3): qty 1

## 2022-05-17 MED ORDER — QUETIAPINE FUMARATE 100 MG PO TABS
300.0000 mg | ORAL_TABLET | Freq: Every day | ORAL | Status: DC
  Administered 2022-05-17 – 2022-05-21 (×5): 300 mg via ORAL
  Filled 2022-05-17 (×4): qty 3

## 2022-05-17 MED ORDER — ACETAMINOPHEN 325 MG PO TABS
650.0000 mg | ORAL_TABLET | Freq: Four times a day (QID) | ORAL | Status: DC | PRN
  Filled 2022-05-17: qty 2

## 2022-05-17 MED ORDER — DIAZEPAM 2 MG PO TABS: 2.0000 mg | ORAL_TABLET | Freq: Once | ORAL | Status: DC

## 2022-05-17 MED ORDER — PANTOPRAZOLE SODIUM 40 MG PO TBEC
40.0000 mg | DELAYED_RELEASE_TABLET | Freq: Every day | ORAL | Status: DC
  Administered 2022-05-17 – 2022-05-22 (×6): 40 mg via ORAL
  Filled 2022-05-17 (×6): qty 1

## 2022-05-17 MED ORDER — DIAZEPAM 2 MG PO TABS
2.0000 mg | ORAL_TABLET | Freq: Once | ORAL | Status: AC
  Administered 2022-05-17: 2 mg via ORAL
  Filled 2022-05-17: qty 1

## 2022-05-17 MED ORDER — CLONAZEPAM 1 MG PO TABS
1.0000 mg | ORAL_TABLET | Freq: Three times a day (TID) | ORAL | Status: DC
  Administered 2022-05-17 (×2): 1 mg via ORAL
  Filled 2022-05-17: qty 1
  Filled 2022-05-17: qty 2

## 2022-05-17 MED ORDER — HYDROXYZINE PAMOATE 50 MG PO CAPS: 50.0000 mg | ORAL_CAPSULE | Freq: Every day | ORAL | Status: DC | PRN

## 2022-05-17 MED ORDER — QUETIAPINE FUMARATE 25 MG PO TABS
25.0000 mg | ORAL_TABLET | Freq: Two times a day (BID) | ORAL | Status: DC
  Administered 2022-05-17 – 2022-05-22 (×10): 25 mg via ORAL
  Filled 2022-05-17 (×10): qty 1

## 2022-05-17 MED ORDER — NICOTINE 21 MG/24HR TD PT24
21.0000 mg | MEDICATED_PATCH | Freq: Every day | TRANSDERMAL | Status: DC
  Administered 2022-05-17 – 2022-05-21 (×5): 21 mg via TRANSDERMAL
  Filled 2022-05-17 (×5): qty 1

## 2022-05-17 MED ORDER — SUCRALFATE 1 G PO TABS
1.0000 g | ORAL_TABLET | Freq: Three times a day (TID) | ORAL | Status: DC
  Administered 2022-05-17 – 2022-05-22 (×17): 1 g via ORAL
  Filled 2022-05-17 (×17): qty 1

## 2022-05-17 MED ORDER — OXYCODONE-ACETAMINOPHEN 10-325 MG PO TABS: 1.0000 | ORAL_TABLET | ORAL | Status: DC | PRN

## 2022-05-17 NOTE — ED Notes (Signed)
Pt belongings placed in visitor's locker #3

## 2022-05-17 NOTE — Assessment & Plan Note (Addendum)
Was px'd prn oxycodone for her severe lymphedema. She states she didn't use often and stopped this one week ago UDS with no opiates Symptoms seem more consistent with opioid withdrawal with pain, diarrhea, N/V, sweating; however, she is 7 days out and should be past the peak and nearly complete with withdrawal Continue supportive care She does not want to start her oxycodone back

## 2022-05-17 NOTE — Assessment & Plan Note (Signed)
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 If due to opioid withdrawal vs. IBS Check stool studies/c.diff Imodium prn  IVF

## 2022-05-17 NOTE — Assessment & Plan Note (Signed)
LE with erythema at baseline per patient. Would monitor closely TED hose

## 2022-05-17 NOTE — H&P (Signed)
History and Physical    Patient: [name] XBM:[id number] DOB: 11-13-1970 DOA: 05/16/2022 DOS: the patient was seen and examined on 05/17/2022 PCP: Sandi Mariscal, MD  Patient coming from: Home - lives alone   Chief Complaint: found down at home   HPI: [name] is a 51 y.o. female with medical history significant of bipolar type 1, GAD,PTSD, THC abuse, gastritis, opioid dependence who presented to ED with weakness and possible unwitnessed syncopal event. She was found down on the ground with possible seizure like activity by her aunt. She tells me she doesn't remember anything from this event. She stopped taking all of her medication 1 week ago.  She has IBS-diarrhea and states her diarrhea is worse than normal and her stomach has hurt. She has had associated N/V and has had poor PO intake x 1 week. She states she hurts all over and feels clammy.   Per her aunt she was lying in her living room and screaming, "help me, help me!" She was convulsing. Her body looked like the worm dance movement where you go up and down. She didn't recognize her aunt and it appeared she was speaking to someone in the room. She seemed to be in a lot of pain. She was talking through entire event. She was able to get her back on the floor. She would try to get up off the floor. When EMS arrived she was speaking and answering questions to the EMS.   She states she feels sad and has struggled with suicide ideation, but denies any currently. No HI/AH/VH.   Denies any fever/chills, vision changes/headaches, chest pain or palpitations, shortness of breath or cough, dysuria or leg swelling.   She smokes 1 PPD, but stopped cold [place] a week ago when she stopped her medication, denies any alcohol use.   ER Course:  vitals: afebrile, bp: 127/87, HR; 89, RR: 18, oxygen: 100%RA Pertinent labs: UDS: +THC, 20 ketones in urine,  CXR: no acute finding CT head: no acute finding  In ED: given valium and zofran. TRH asked  to admit.    Review of Systems: As mentioned in the history of present illness. All other systems reviewed and are negative. 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   Jaundice    MRSA (methicillin resistant Staphylococcus aureus)    hospitalized for 48 hours in Dec 2016   Staph infection    "in my blood"   Past Surgical History:  Procedure Laterality Date   CHOLECYSTECTOMY  08/02/2012   Procedure: LAPAROSCOPIC CHOLECYSTECTOMY WITH INTRAOPERATIVE CHOLANGIOGRAM;  Surgeon: Rolm Bookbinder, MD;  Location: [facility];  Service: General;  Laterality: N/A;   COLONOSCOPY WITH PROPOFOL  04/09/2014   COLONOSCOPY WITH PROPOFOL N/A 07/14/2016   Procedure: COLONOSCOPY WITH PROPOFOL;  Surgeon: Milus Banister, MD;  Location: [facility];  Service: Endoscopy;  Laterality: N/A;   DILATION AND CURETTAGE OF UTERUS     w/ hysteroscopy to remove cyst   ESOPHAGOGASTRODUODENOSCOPY N/A 12/02/2017   Procedure: ESOPHAGOGASTRODUODENOSCOPY (EGD);  Surgeon: Carol Ada, MD;  Location: [facility];  Service: Endoscopy;  Laterality: N/A;   ESOPHAGOGASTRODUODENOSCOPY (EGD) WITH PROPOFOL  04/09/2014   ESOPHAGOGASTRODUODENOSCOPY (EGD) WITH PROPOFOL N/A 07/14/2016   Procedure: ESOPHAGOGASTRODUODENOSCOPY (EGD) WITH PROPOFOL;  Surgeon: Milus Banister, MD;  Location: [facility];  Service: Endoscopy;  Laterality: N/A;   HYSTEROSCOPY WITH D & C  06/22/2012   Procedure: DILATATION AND  CURETTAGE /HYSTEROSCOPY;  Surgeon: Lahoma Crocker, MD;  Location: [facility];  Service: Gynecology;  Laterality: N/A;   KNEE ARTHROSCOPY  2011   left   ORIF TOE FRACTURE Left 11/05/2014   Procedure: OPEN REDUCTION INTERNAL FIXATION (ORIF) LEFT FIFTH METATARSAL (TOE) FRACTURE WITH CALCANEAL BONE GRAFT;  Surgeon: Dorna Leitz, MD;  Location: [facility];  Service: Orthopedics;  Laterality: Left;   WISDOM TOOTH EXTRACTION     Social History:  reports that she has been smoking cigarettes. She has a 33.00 pack-year smoking history. She has never used smokeless tobacco. She reports that she does not drink alcohol and does not use drugs.  Allergies  Allergen Reactions   Codeine Nausea And Vomiting    "Pt can take Vicodin if given with promethazine" "Pt can take Vicodin if given with promethazine"    Family History  Problem Relation Age of Onset   Heart disease Father    Stroke Father    Heart attack Father    Skin cancer Father    Diabetes Father    Uterine cancer Mother 65   COPD Mother    Cancer - Colon Maternal Grandfather        mets to bone   Stomach cancer Paternal Grandmother    Cervical cancer Sister        dx between 47-33   Migraines Sister    Prostate cancer Maternal Uncle 58       mets to bone   Ovarian cancer Paternal Aunt    Heart attack Paternal Grandfather    Skin cancer Paternal Aunt     Prior to Admission medications   Medication Sig Start Date End Date Taking? Authorizing Provider  acetaminophen (TYLENOL) 325 MG tablet Take 650 mg by mouth every 6 (six) hours as needed for mild pain or headache.   Yes [provider]  clonazePAM (KLONOPIN) 1 MG tablet Take 1 mg by mouth in the morning, at noon, and at bedtime. 10/10/20  Yes [provider]  cyclobenzaprine (FLEXERIL) 10 MG tablet Take 10 mg by mouth 3 (three) times daily. 05/07/22  Yes [provider]  hydrOXYzine (VISTARIL) 50 MG capsule Take 50 mg by mouth daily as needed for  anxiety. 09/24/20  Yes [provider]  ibuprofen (ADVIL) 800 MG tablet Take 800 mg by mouth 4 (four) times daily as needed for headache or mild pain. 06/05/20  Yes [provider]  mirtazapine (REMERON) 15 MG tablet Take 1 tablet (15 mg total) by mouth at bedtime. 04/05/21  Yes Corky Sox, MD  oxyCODONE-acetaminophen (PERCOCET) 10-325 MG tablet Take 1 tablet by mouth every 4 (four) hours as needed for pain.   Yes [provider]  pantoprazole (PROTONIX) 40 MG tablet Take 40 mg by mouth daily. 10/04/20  Yes [provider]  prazosin (MINIPRESS) 2 MG capsule Take 1 capsule (  2 mg total) by mouth at bedtime. 04/05/21  Yes Corky Sox, MD  QUEtiapine (SEROQUEL) 300 MG tablet Take 1 tablet (300 mg total) by mouth at bedtime. 03/28/21  Yes Rosezetta Schlatter, MD  sucralfate (CARAFATE) 1 g tablet Take 1 g by mouth in the morning, at noon, and at bedtime. 03/03/22  Yes [provider]  Elastic Bandages & Supports ([place]) [place] 1 pair of compression stocking for patient use. 06/12/21   Larene Pickett, PA-C  gabapentin (NEURONTIN) 600 MG tablet Take 600 mg by mouth 3 (three) times daily. 09/29/20   [provider]  nicotine (NICODERM CQ - DOSED IN MG/24 HOURS) 21 mg/24hr patch Place 1 patch (21 mg total) onto the skin daily. Patient not taking: Reported on 05/16/2022 04/05/21   Corky Sox, MD  QUEtiapine (SEROQUEL) 25 MG tablet Take 25 mg by mouth 2 (two) times daily. 10/05/20   [provider]    Physical Exam: Vitals:   05/17/22 0340 05/17/22 0810 05/17/22 1414 05/17/22 1757  BP: 127/87 (!) 140/92 (!) 147/88   Pulse: 89 98 89   Resp: '18 18 16   '$ Temp: 98.4 F (36.9 C) 98.5 F (36.9 C) 98.6 F (37 C) 98.4 F (36.9 C)  TempSrc: Oral  Oral   SpO2: 100% 98% 99%   Height:       General:  Appears calm and comfortable and is in NAD. Tearful during exam.  Eyes:  PERRL, EOMI, normal lids, iris ENT:  grossly normal hearing,  lips & tongue, mmm; appropriate dentition Neck:  no LAD, masses or thyromegaly; no carotid bruits Cardiovascular:  RRR, no m/r/g.  Respiratory:   CTA bilaterally with no wheezes/rales/rhonchi.  Normal respiratory effort. Abdomen:  soft, NT, ND, NABS Back:   normal alignment, no CVAT Skin:  no rash or induration seen on limited exam. Chronic lymphedema in BLE, erythematous.  Musculoskeletal:  grossly normal tone BUE/BLE, good ROM, no bony abnormality Lower extremity:   Limited foot exam with no ulcerations.  2+ distal pulses. Psychiatric:  grossly normal mood and affect, speech fluent and appropriate, Aox3. Denies any current SI/HI/AH/VH Neurologic:  CN 2-12 grossly intact, moves all extremities in coordinated fashion, sensation intact   Radiological Exams on Admission: Independently reviewed - see discussion in A/P where applicable  DG Chest 1 View  Result Date: 05/17/2022 CLINICAL DATA:  Shortness of breath EXAM: CHEST  1 VIEW COMPARISON:  06/12/2021 FINDINGS: Cardiac and mediastinal contours are within normal limits given AP technique. No focal pulmonary opacity. No pleural effusion or pneumothorax. No acute osseous abnormality. IMPRESSION: No acute cardiopulmonary process. Electronically Signed   By: Merilyn Baba M.D.   On: 05/17/2022 11:00   CT HEAD WO CONTRAST (5MM)  Result Date: 05/16/2022 CLINICAL DATA:  Syncope/presyncope, cerebrovascular cause suspected EXAM: CT HEAD WITHOUT CONTRAST TECHNIQUE: Contiguous axial images were obtained from the base of the skull through the vertex without intravenous contrast. RADIATION DOSE REDUCTION: This exam was performed according to the departmental dose-optimization program which includes automated exposure control, adjustment of the mA and/or kV according to patient size and/or use of iterative reconstruction technique. COMPARISON:  None Available. FINDINGS: Brain: Normal anatomic configuration. No abnormal intra or extra-axial mass lesion or  fluid collection. No abnormal mass effect or midline shift. No evidence of acute intracranial hemorrhage or infarct. Ventricular size is normal. Cerebellum unremarkable. Vascular: Unremarkable Skull: Intact Sinuses/Orbits: Paranasal sinuses are clear. Orbits are unremarkable. Other: Mastoid air cells and middle ear cavities are clear. IMPRESSION:  No acute intracranial abnormality. Electronically Signed   By: Fidela Salisbury M.D.   On: 05/16/2022 22:43    EKG: pending    Labs on Admission: I have personally reviewed the available labs and imaging studies at the time of the admission.  Pertinent labs:   UDS: +THC, 20 ketones in urine,  Assessment and Plan: Principal Problem:   unwitnessed syncope Active Problems:   Benzodiazepine withdrawal (HCC)   Opiate dependence (HCC)   Severe bipolar I disorder, current or most recent episode depressed (HCC)   IBS (irritable bowel syndrome)   Gastritis and gastroduodenitis   PTSD (post-traumatic stress disorder)   Cannabis abuse/tobacco abuse    Chronic acquired lymphedema    Assessment and Plan: * unwitnessed syncope 51 year old presenting to hospital after family found her down on ground for unknown period of time with possible seizure like activity in setting of stopping all medication, including klonopin and oxy, one week ago and poor PO intake with diarrhea/N/V -obs to progressive -CT head with no acute finding -orthostatics unremarkable, will continue to check daily -CK wnl  -TED hose/IVF -check troponin  -UDS: +THC. Ethanol/tylenol all negative. States she does not smoke THC daily  -check TSH/mag -no signs of infectious cause.  -check echo since unwitnessed  -has been 7 days off opioids and wants to stay off,should be past peak. Supportive care -given valium in ED as some concern for BZD withdrawal seizure. Will start back her klonopin TID. Seizure precautions. Continue gabapentin as well.   Benzodiazepine withdrawal ([place]) Unsure if  she had a seizure; however, after talking to her aunt who witnessed this does not sound like a seizure. She was talking during the event and moving body up and down. She would try to get up off of the floor. She did have a headache afterwards. Has been off her klonopin x 1 week cold [place] Given valium in ED, will start back her home klonopin TID +gabapentin On progressive, telemetry Seizure precautions Psychiatry consulted  Would check EEG if any occurs again   Opiate dependence ([place]) Was px'd prn oxycodone for her severe lymphedema. She states she didn't use often and stopped this one week ago UDS with no opiates Symptoms seem more consistent with opioid withdrawal with pain, diarrhea, N/V, sweating; however, she is 7 days out and should be past the peak and nearly complete with withdrawal Continue supportive care She does not want to start her oxycodone back   Severe bipolar I disorder, current or most recent episode depressed ([place]) No current SI/AH, but has struggled with this in the past Stopped taking her medication one week ago Consult psychiatry.  Start back her home seroquel   IBS (irritable bowel syndrome) History of IBS-D, but diarrhea is worse than normal No blood or recent antibiotic ? If due to opioid withdrawal vs. IBS Check stool studies/c.diff Imodium prn  IVF   Gastritis and gastroduodenitis Continue protonix and carafate   PTSD (post-traumatic stress disorder) Continue minipress   Cannabis abuse/tobacco abuse  Nicotine patch encouraged to quit   Chronic acquired lymphedema LE with erythema at baseline per patient. Would monitor closely TED hose     Advance Care Planning:   Code Status: Full Code   Consults: psychiatry   DVT Prophylaxis: SCDs/ambulation   Family Communication: updated Dellis Anes, her aunt, by phone.   Severity of Illness: The appropriate patient status for this patient is OBSERVATION. Observation status is judged to be  reasonable and necessary in order to  provide the required intensity of service to ensure the patient's safety. The patient's presenting symptoms, physical exam findings, and initial radiographic and laboratory data in the context of their medical condition is felt to place them at decreased risk for further clinical deterioration. Furthermore, it is anticipated that the patient will be medically stable for discharge from the hospital within 2 midnights of admission.   Author: Orma Flaming, MD 05/17/2022 5:59 PM  For on call review [email or web address].

## 2022-05-17 NOTE — ED Notes (Signed)
Patient now reports to this RN that she is not having SI and has no intent to harm herself. Provider Dr Rogers Blocker agrees with assessment

## 2022-05-17 NOTE — ED Provider Notes (Signed)
[place] EMERGENCY DEPARTMENT Provider Note   CSN: [id number] Arrival date & time: 05/16/22  2029     History  Chief Complaint  Patient presents with   Loss of Consciousness   Suicidal    [name] is a 51 y.o. female with a past medical history significant for IBS, GERD, bipolar disorder who presents to the ED due to suicidal ideations and a syncopal episode.  Patient admits to [facility] for the past week.  Patient states she stopped all of her medications roughly 1 week ago due to not feeling well on them.  Admits to SI however, denies any plans.  Previous suicide attempts.  Denies HI.  Patient notes she is unsure whether or not she has hallucinations.  Admits to marijuana use however, no other drugs.  Denies alcohol use.  Admits to daily tobacco use.  Patient also admits to a syncopal episode that occurred yesterday.  Patient states her aunt found her on the ground "seizing". No history of seizures.  Denies chronic alcohol use.  She notes she had COVID 1 month ago. Patient notes she was taking Klonopin 2-3 times daily and stopped roughly 1 week ago.  She also notes she has not been eating and drinking normally over the past week.  Admits to lightheadedness upon standing. She notes she has had nausea all day yesterday. She admits to some myalgias.   History obtained from patient and past medical records. No interpreter used during encounter.       Home Medications Prior to Admission medications   Medication Sig Start Date End Date Taking? Authorizing Provider  acetaminophen (TYLENOL) 325 MG tablet Take 650 mg by mouth every 6 (six) hours as needed for mild pain or headache.   Yes [provider]  clonazePAM (KLONOPIN) 1 MG tablet Take 1 mg by mouth in the morning, at noon, and at bedtime. 10/10/20  Yes [provider]  cyclobenzaprine (FLEXERIL) 10 MG tablet Take 10 mg by mouth 3 (three) times daily. 05/07/22  Yes [provider]   hydrOXYzine (VISTARIL) 50 MG capsule Take 50 mg by mouth daily as needed for anxiety. 09/24/20  Yes [provider]  ibuprofen (ADVIL) 800 MG tablet Take 800 mg by mouth 4 (four) times daily as needed for headache or mild pain. 06/05/20  Yes [provider]  mirtazapine (REMERON) 15 MG tablet Take 1 tablet (15 mg total) by mouth at bedtime. 04/05/21  Yes Corky Sox, MD  oxyCODONE-acetaminophen (PERCOCET) 10-325 MG tablet Take 1 tablet by mouth every 4 (four) hours as needed for pain.   Yes [provider]  pantoprazole (PROTONIX) 40 MG tablet Take 40 mg by mouth daily. 10/04/20  Yes [provider]  prazosin (MINIPRESS) 2 MG capsule Take 1 capsule (2 mg total) by mouth at bedtime. 04/05/21  Yes Corky Sox, MD  QUEtiapine (SEROQUEL) 300 MG tablet Take 1 tablet (300 mg total) by mouth at bedtime. 03/28/21  Yes Rosezetta Schlatter, MD  sucralfate (CARAFATE) 1 g tablet Take 1 g by mouth in the morning, at noon, and at bedtime. 03/03/22  Yes [provider]  Elastic Bandages & Supports ([place]) [place] 1 pair of compression stocking for patient use. 06/12/21   Larene Pickett, PA-C  gabapentin (NEURONTIN) 600 MG tablet Take 600 mg by mouth 3 (three) times daily. 09/29/20   [provider]  nicotine (NICODERM CQ - DOSED IN MG/24 HOURS) 21 mg/24hr patch Place 1 patch (21 mg total)  onto the skin daily. Patient not taking: Reported on 05/16/2022 04/05/21   Corky Sox, MD  QUEtiapine (SEROQUEL) 25 MG tablet Take 25 mg by mouth 2 (two) times daily. 10/05/20   [provider]      Allergies    Codeine    Review of Systems   Review of Systems  Constitutional:  Positive for activity change, appetite change and fatigue. Negative for chills and fever.  Respiratory:  Negative for shortness of breath.   Cardiovascular:  Negative for chest pain.  Gastrointestinal:  Positive for nausea and vomiting. Negative for abdominal pain.   Neurological:  Positive for tremors, syncope and light-headedness.  All other systems reviewed and are negative.   Physical Exam Updated Vital Signs BP (!) 147/88 (BP Location: Left Arm)   Pulse 89   Temp 98.6 F (37 C) (Oral)   Resp 16   Ht 5' (1.524 m)   LMP 06/14/2012   SpO2 99%   BMI 36.13 kg/m  Physical Exam Vitals and nursing note reviewed.  Constitutional:      General: She is not in acute distress.    Appearance: She is not ill-appearing.  HENT:     Head: Normocephalic.  Eyes:     Pupils: Pupils are equal, round, and reactive to light.  Cardiovascular:     Rate and Rhythm: Normal rate and regular rhythm.     Pulses: Normal pulses.     Heart sounds: Normal heart sounds. No murmur heard.    No friction rub. No gallop.  Pulmonary:     Effort: Pulmonary effort is normal.     Breath sounds: Normal breath sounds.  Abdominal:     General: Abdomen is flat. There is no distension.     Palpations: Abdomen is soft.     Tenderness: There is no abdominal tenderness. There is no guarding or rebound.  Musculoskeletal:        General: Normal range of motion.     Cervical back: Neck supple.  Skin:    General: Skin is warm and dry.  Neurological:     General: No focal deficit present.     Mental Status: She is alert and oriented to person, place, and time.     Comments: Tongue fasciculations, some arm tremors. No focal deficits  Psychiatric:        Mood and Affect: Mood normal.        Behavior: Behavior normal.     ED Results / Procedures / Treatments   Labs (all labs ordered are listed, but only abnormal results are displayed) Labs Reviewed  CBC WITH DIFFERENTIAL/PLATELET - Abnormal; Notable for the following components:      Result Value   WBC 12.9 (*)    Hemoglobin 16.1 (*)    HCT 47.8 (*)    Neutro Abs 9.7 (*)    All other components within normal limits  COMPREHENSIVE METABOLIC PANEL - Abnormal; Notable for the following components:   CO2 20 (*)     Glucose, Bld 117 (*)    All other components within normal limits  URINALYSIS, ROUTINE W REFLEX MICROSCOPIC - Abnormal; Notable for the following components:   APPearance HAZY (*)    Ketones, ur 20 (*)    Protein, ur 30 (*)    Bacteria, UA RARE (*)    All other components within normal limits  RAPID URINE DRUG SCREEN, HOSP PERFORMED - Abnormal; Notable for the following components:   Tetrahydrocannabinol POSITIVE (*)    All other  components within normal limits  SALICYLATE LEVEL - Abnormal; Notable for the following components:   Salicylate Lvl <9.9 (*)    All other components within normal limits  ACETAMINOPHEN LEVEL - Abnormal; Notable for the following components:   Acetaminophen (Tylenol), Serum <10 (*)    All other components within normal limits  ETHANOL  BRAIN NATRIURETIC PEPTIDE  CK    EKG EKG Interpretation  Date/Time:  Monday May 16 2022 21:00:07 EDT Ventricular Rate:  101 PR Interval:  182 QRS Duration: 70 QT Interval:  360 QTC Calculation: 466 R Axis:   81 Text Interpretation: Sinus tachycardia T wave abnormality, consider anterolateral ischemia similar to oct 2022 Confirmed by Sherwood Gambler [phone]) on 05/17/2022 7:57:37 AM  Radiology DG Chest 1 View  Result Date: 05/17/2022 CLINICAL DATA:  Shortness of breath EXAM: CHEST  1 VIEW COMPARISON:  06/12/2021 FINDINGS: Cardiac and mediastinal contours are within normal limits given AP technique. No focal pulmonary opacity. No pleural effusion or pneumothorax. No acute osseous abnormality. IMPRESSION: No acute cardiopulmonary process. Electronically Signed   By: Merilyn Baba M.D.   On: 05/17/2022 11:00   CT HEAD WO CONTRAST (5MM)  Result Date: 05/16/2022 CLINICAL DATA:  Syncope/presyncope, cerebrovascular cause suspected EXAM: CT HEAD WITHOUT CONTRAST TECHNIQUE: Contiguous axial images were obtained from the base of the skull through the vertex without intravenous contrast. RADIATION DOSE REDUCTION: This exam was  performed according to the departmental dose-optimization program which includes automated exposure control, adjustment of the mA and/or kV according to patient size and/or use of iterative reconstruction technique. COMPARISON:  None Available. FINDINGS: Brain: Normal anatomic configuration. No abnormal intra or extra-axial mass lesion or fluid collection. No abnormal mass effect or midline shift. No evidence of acute intracranial hemorrhage or infarct. Ventricular size is normal. Cerebellum unremarkable. Vascular: Unremarkable Skull: Intact Sinuses/Orbits: Paranasal sinuses are clear. Orbits are unremarkable. Other: Mastoid air cells and middle ear cavities are clear. IMPRESSION: No acute intracranial abnormality. Electronically Signed   By: Fidela Salisbury M.D.   On: 05/16/2022 22:43    Procedures Procedures    Medications Ordered in ED Medications  diazepam (VALIUM) tablet 2 mg (has no administration in time range)  diazepam (VALIUM) tablet 2 mg (2 mg Oral Given 05/17/22 1204)  ondansetron (ZOFRAN-ODT) disintegrating tablet 4 mg (4 mg Oral Given 05/17/22 1342)    ED Course/ Medical Decision Making/ A&P Clinical Course as of 05/17/22 1500  Tue May 17, 2022  0959 WBC(!): 12.9 [CA]  0959 Tetrahydrocannabinol(!): POSITIVE [CA]  1400 B Natriuretic Peptide: 20.0 [CA]  1400 CK Total: 69 [CA]    Clinical Course User Index [CA] Suzy Bouchard, PA-C                           Medical Decision Making Amount and/or Complexity of Data Reviewed External Data Reviewed: notes.    Details: Wound care notes Labs: ordered. Decision-making details documented in ED Course. Radiology: ordered and independent interpretation performed. Decision-making details documented in ED Course. ECG/medicine tests: ordered and independent interpretation performed. Decision-making details documented in ED Course.  Risk Prescription drug management. Decision regarding hospitalization.  51 year old female presents  to the ED due to suicidal ideations and a syncopal episode.  Patient has a history of bipolar disorder and notes she stopped all of her medications roughly 1 week ago.  Patient states her aunt found her on the ground "seizing".  Patient denies history of seizures.  Denies chronic alcohol use.  She admits to taking Klonopin 2 to 3 times daily which she stopped roughly 1 week ago.  She also endorses tremors, nausea, and diaphoresis.  No chest pain or shortness of breath.  Upon arrival, stable vitals.  Patient in no acute distress.  Unfortunately, patient waited over 13 hours prior to my initial evaluation due to long wait times.  Medical clearance labs ordered at triage.  Physical exam significant for tongue fasciculation and some facial diaphoresis.  Patient has upper extremity tremors.  Admits to SI.  Denies HI.  Patient notes she is unsure whether or not she is having hallucinations.  Given possible benzodiazepine withdrawal will give dose of Valium and reassess.  Added CK due to full body myalgias.  Also BNP to rule out CHF given bilateral lower extremity edema. Discussed with Dr. Ernesto Rutherford who evaluated patient at bedside and agrees with assessment and plan.   CBC significant for mild leukocytosis at 12.9.  CMP reassuring.  Normal renal function.  No major electrolyte derangements.  CK normal. Doubt rhabdomyolysis.  BNP normal.  Doubt CHF.  UDS positive for THC. UA significant for ketonuria and proteinuria.  No signs of infection.  Chest x-ray personally reviewed and interpreted which is negative for signs of pneumonia, pneumothorax, or widened mediastinum.  CT head personally reviewed and interpreted which is negative for any acute abnormalities.  EKG demonstrates sinus tachycardia.  Nonspecific T wave abnormalities which were present in previous EKG. acetaminophen, salicylate, and ethanol levels within normal limits.  12:48 PM Reassessed patient who notes slight improvement in symptoms. Patient tearful at  bedside.   2:21 PM Patient still tremulous at bedside. Concern for possible benzodiazepine withdrawal.  Will discuss with hospitalist for admission given possible seizure-like activity earlier today. Unable to reach family member who witnessed possible seizure after numerous attempts. Will give another dose of Valium. Patient will need TTS evaluation during her admission due to SI.   3:00 PM Discussed with Dr. Rogers Blocker with [facility] who agrees to admit patient for further treatment.         Final Clinical Impression(s) / ED Diagnoses Final diagnoses:  Suicidal ideation  Benzodiazepine withdrawal with complication [facility])    Rx / DC Orders ED Discharge Orders     None         Suzy Bouchard, PA-C 05/17/22 1501    Kneller, [place] K, DO 05/17/22 1620

## 2022-05-17 NOTE — Assessment & Plan Note (Addendum)
Unsure if she had a seizure; however, after talking to her aunt who witnessed this does not sound like a seizure. She was talking during the event and moving body up and down. She would try to get up off of the floor. She did have a headache afterwards. Has been off her klonopin x 1 week cold [place] Given valium in ED, will start back her home klonopin TID +gabapentin On progressive, telemetry Seizure precautions Psychiatry consulted  Would check EEG if any occurs again

## 2022-05-17 NOTE — Assessment & Plan Note (Signed)
Continue minipress

## 2022-05-17 NOTE — ED Notes (Signed)
Patient reminded of the need for a stool sample at this time

## 2022-05-17 NOTE — Assessment & Plan Note (Signed)
Continue protonix and carafate  ?

## 2022-05-17 NOTE — Assessment & Plan Note (Signed)
No current SI/AH, but has struggled with this in the past Stopped taking her medication one week ago Consult psychiatry.  Start back her home seroquel

## 2022-05-17 NOTE — Assessment & Plan Note (Addendum)
51 year old presenting to hospital after family found her down on ground for unknown period of time with possible seizure like activity in setting of stopping all medication, including klonopin and oxy, one week ago and poor PO intake with diarrhea/N/V -obs to progressive -CT head with no acute finding -orthostatics unremarkable, will continue to check daily -CK wnl  -TED hose/IVF -check troponin  -UDS: +THC. Ethanol/tylenol all negative. States she does not smoke THC daily  -check TSH/mag -no signs of infectious cause.  -check echo since unwitnessed  -has been 7 days off opioids and wants to stay off,should be past peak. Supportive care -given valium in ED as some concern for BZD withdrawal seizure. Will start back her klonopin TID. Seizure precautions. Continue gabapentin as well.

## 2022-05-17 NOTE — Assessment & Plan Note (Signed)
Nicotine patch encouraged to quit

## 2022-05-18 ENCOUNTER — Observation Stay (HOSPITAL_COMMUNITY): Payer: Medicare Other

## 2022-05-18 DIAGNOSIS — F1123 Opioid dependence with withdrawal: Secondary | ICD-10-CM | POA: Diagnosis present

## 2022-05-18 DIAGNOSIS — Z91128 Patient's intentional underdosing of medication regimen for other reason: Secondary | ICD-10-CM | POA: Diagnosis not present

## 2022-05-18 DIAGNOSIS — K2990 Gastroduodenitis, unspecified, without bleeding: Secondary | ICD-10-CM | POA: Diagnosis present

## 2022-05-18 DIAGNOSIS — R45851 Suicidal ideations: Secondary | ICD-10-CM

## 2022-05-18 DIAGNOSIS — F13930 Sedative, hypnotic or anxiolytic use, unspecified with withdrawal, uncomplicated: Secondary | ICD-10-CM | POA: Diagnosis not present

## 2022-05-18 DIAGNOSIS — R0789 Other chest pain: Secondary | ICD-10-CM | POA: Diagnosis not present

## 2022-05-18 DIAGNOSIS — Z6836 Body mass index (BMI) 36.0-36.9, adult: Secondary | ICD-10-CM | POA: Diagnosis not present

## 2022-05-18 DIAGNOSIS — Z85820 Personal history of malignant melanoma of skin: Secondary | ICD-10-CM | POA: Diagnosis not present

## 2022-05-18 DIAGNOSIS — Z8249 Family history of ischemic heart disease and other diseases of the circulatory system: Secondary | ICD-10-CM | POA: Diagnosis not present

## 2022-05-18 DIAGNOSIS — F411 Generalized anxiety disorder: Secondary | ICD-10-CM | POA: Diagnosis present

## 2022-05-18 DIAGNOSIS — K219 Gastro-esophageal reflux disease without esophagitis: Secondary | ICD-10-CM | POA: Diagnosis present

## 2022-05-18 DIAGNOSIS — K580 Irritable bowel syndrome with diarrhea: Secondary | ICD-10-CM | POA: Diagnosis present

## 2022-05-18 DIAGNOSIS — F13239 Sedative, hypnotic or anxiolytic dependence with withdrawal, unspecified: Secondary | ICD-10-CM | POA: Diagnosis present

## 2022-05-18 DIAGNOSIS — F4310 Post-traumatic stress disorder, unspecified: Secondary | ICD-10-CM

## 2022-05-18 DIAGNOSIS — F314 Bipolar disorder, current episode depressed, severe, without psychotic features: Secondary | ICD-10-CM | POA: Diagnosis present

## 2022-05-18 DIAGNOSIS — I890 Lymphedema, not elsewhere classified: Secondary | ICD-10-CM | POA: Diagnosis present

## 2022-05-18 DIAGNOSIS — R55 Syncope and collapse: Secondary | ICD-10-CM | POA: Diagnosis present

## 2022-05-18 DIAGNOSIS — E860 Dehydration: Secondary | ICD-10-CM | POA: Diagnosis present

## 2022-05-18 DIAGNOSIS — Z79899 Other long term (current) drug therapy: Secondary | ICD-10-CM | POA: Diagnosis not present

## 2022-05-18 DIAGNOSIS — Z8616 Personal history of COVID-19: Secondary | ICD-10-CM | POA: Diagnosis not present

## 2022-05-18 DIAGNOSIS — F1210 Cannabis abuse, uncomplicated: Secondary | ICD-10-CM | POA: Diagnosis present

## 2022-05-18 DIAGNOSIS — K2970 Gastritis, unspecified, without bleeding: Secondary | ICD-10-CM | POA: Diagnosis present

## 2022-05-18 DIAGNOSIS — M1990 Unspecified osteoarthritis, unspecified site: Secondary | ICD-10-CM | POA: Diagnosis present

## 2022-05-18 DIAGNOSIS — E876 Hypokalemia: Secondary | ICD-10-CM | POA: Diagnosis present

## 2022-05-18 DIAGNOSIS — F1120 Opioid dependence, uncomplicated: Secondary | ICD-10-CM | POA: Diagnosis present

## 2022-05-18 DIAGNOSIS — F17210 Nicotine dependence, cigarettes, uncomplicated: Secondary | ICD-10-CM | POA: Diagnosis present

## 2022-05-18 LAB — ECHOCARDIOGRAM COMPLETE
AR max vel: 1.9 cm2
AV Area VTI: 1.8 cm2
AV Area mean vel: 1.79 cm2
AV Mean grad: 3 mmHg
AV Peak grad: 6.4 mmHg
Ao pk vel: 1.26 m/s
Area-P 1/2: 4.21 cm2
Height: 60 in
S' Lateral: 2.9 cm

## 2022-05-18 LAB — CBC
HCT: 44.3 % (ref 36.0–46.0)
Hemoglobin: 14.7 g/dL (ref 12.0–15.0)
MCH: 31.2 pg (ref 26.0–34.0)
MCHC: 33.2 g/dL (ref 30.0–36.0)
MCV: 94.1 fL (ref 80.0–100.0)
Platelets: 346 10*3/uL (ref 150–400)
RBC: 4.71 MIL/uL (ref 3.87–5.11)
RDW: 13.2 % (ref 11.5–15.5)
WBC: 9.7 10*3/uL (ref 4.0–10.5)
nRBC: 0 % (ref 0.0–0.2)

## 2022-05-18 LAB — BASIC METABOLIC PANEL
Anion gap: 6 (ref 5–15)
BUN: 12 mg/dL (ref 6–20)
CO2: 23 mmol/L (ref 22–32)
Calcium: 8.9 mg/dL (ref 8.9–10.3)
Chloride: 112 mmol/L — ABNORMAL HIGH (ref 98–111)
Creatinine, Ser: 0.89 mg/dL (ref 0.44–1.00)
GFR, Estimated: >60 mL/min (ref >60)
Glucose, Bld: 114 mg/dL — ABNORMAL HIGH (ref 70–99)
Potassium: 3 mmol/L — ABNORMAL LOW (ref 3.5–5.1)
Sodium: 141 mmol/L (ref 135–145)

## 2022-05-18 MED ORDER — ENOXAPARIN SODIUM 40 MG/0.4ML IJ SOSY
40.0000 mg | PREFILLED_SYRINGE | Freq: Every day | INTRAMUSCULAR | Status: DC
  Administered 2022-05-18 – 2022-05-22 (×5): 40 mg via SUBCUTANEOUS
  Filled 2022-05-18 (×5): qty 0.4

## 2022-05-18 MED ORDER — OXYCODONE-ACETAMINOPHEN 5-325 MG PO TABS
1.0000 | ORAL_TABLET | Freq: Four times a day (QID) | ORAL | Status: DC | PRN
  Administered 2022-05-18 – 2022-05-22 (×12): 1 via ORAL
  Filled 2022-05-18 (×12): qty 1

## 2022-05-18 MED ORDER — ACETAMINOPHEN 325 MG PO TABS
650.0000 mg | ORAL_TABLET | Freq: Four times a day (QID) | ORAL | Status: DC | PRN
  Administered 2022-05-18 – 2022-05-21 (×3): 650 mg via ORAL
  Filled 2022-05-18 (×2): qty 2

## 2022-05-18 MED ORDER — POTASSIUM CHLORIDE CRYS ER 20 MEQ PO TBCR
40.0000 meq | EXTENDED_RELEASE_TABLET | ORAL | Status: AC
  Administered 2022-05-18 (×2): 40 meq via ORAL
  Filled 2022-05-18 (×2): qty 2

## 2022-05-18 MED ORDER — CYCLOBENZAPRINE HCL 5 MG PO TABS
5.0000 mg | ORAL_TABLET | Freq: Three times a day (TID) | ORAL | Status: DC
  Administered 2022-05-18 – 2022-05-22 (×13): 5 mg via ORAL
  Filled 2022-05-18 (×13): qty 1

## 2022-05-18 MED ORDER — CLONAZEPAM 0.5 MG PO TABS
0.5000 mg | ORAL_TABLET | Freq: Three times a day (TID) | ORAL | Status: DC
  Administered 2022-05-18 – 2022-05-22 (×12): 0.5 mg via ORAL
  Filled 2022-05-18 (×12): qty 1

## 2022-05-18 NOTE — Plan of Care (Signed)

## 2022-05-18 NOTE — TOC Initial Note (Addendum)
Transition of Care [facility]) - Initial/Assessment Note    Patient Details  Name: [name] MRN: [id number] Date of Birth: 09/18/1970  Transition of Care [facility]) CM/SW Contact:    Sharin Mons, RN Phone Number: 05/18/2022, 8:25 AM  Clinical Narrative:        Presents with    suicidal ideations and a syncopal episode.       From home alone. Supportive family. Aunt lives next door.PTA independent with ADL's, no DME usage. States on disability, hx of bipolar. Pt without concerns affording Rx meds. States without  transportation issues, family/friend assist with providing transportation to provider appointment. PCP: Dr. [facility].  States agreeable to home health services if needed @ d/c. Preference :[facility]. States has used them in the past.  Psychiatry consult pending....  TOC team following and will assist with needs....  Expected Discharge Plan: [place] (vs SNF) Barriers to Discharge: Continued Medical Work up   Patient Goals and CMS Choice     Choice offered to / list presented to : Patient  Expected Discharge Plan and Services Expected Discharge Plan: [place] (vs SNF)   Discharge Planning Services: CM Consult   Living arrangements for the past 2 months: Single Family Home                                      Prior Living Arrangements/Services Living arrangements for the past 2 months: Single Family Home Lives with:: Self   Do you feel safe going back to the place where you live?: Yes      Need for Family Participation in Patient Care: Yes (Comment) Care giver support system in place?: Yes (comment)   Criminal Activity/Legal Involvement Pertinent to Current Situation/Hospitalization: No - Comment as needed  Activities of Daily Living      Permission Sought/Granted   Permission granted to share information with : Yes, Verbal Permission Granted  Share Information with NAME: Kalman Jewels  [phone]           Emotional Assessment Appearance:: Appears stated age Attitude/Demeanor/Rapport: Gracious Affect (typically observed): Accepting Orientation: : Oriented to Self, Oriented to Place, Oriented to  Time, Oriented to Situation Alcohol / Substance Use: Tobacco Use (SMOKES 1 PK PER DAY, trying to stop smoking , uses nicotine patche) Psych Involvement: No (comment)  Admission diagnosis:  Suicidal ideation [R45.851] Benzodiazepine withdrawal ([place]) [F13.939] Benzodiazepine withdrawal with complication [facility]) [J00.938] Patient Active Problem List   Diagnosis Date Noted   Benzodiazepine withdrawal ([place]) 05/17/2022   unwitnessed syncope 05/17/2022   Chronic acquired lymphedema 05/17/2022   Bipolar affective disorder, depressed, severe, with psychotic behavior ([place]) 03/30/2021   Cannabis abuse/tobacco abuse  03/30/2021   Suicidal ideation 03/28/2021   PTSD (post-traumatic stress disorder) 04/09/2020   Severe bipolar I disorder, current or most recent episode depressed ([place]) 04/08/2020   Opiate dependence ([place]) 04/08/2020   Inadequate sleep hygiene 04/06/2017   Snoring 04/06/2017   Sleep apnea 04/06/2017   Sleep deprivation 04/06/2017   Genetic testing 08/24/2016   Family history of ovarian cancer    Family history of uterine cancer    Family history of prostate cancer    Dysphagia 06/23/2016   Cellulitis 03/02/2016   Loss of weight 02/04/2016   Diarrhea of presumed infectious origin 02/04/2016   Generalized abdominal pain 02/04/2016   Internal hemorrhoid, bleeding 04/24/2014  Irregular menstrual cycle 06/22/2012   TOBACCO ABUSE 10/19/2009   SYNCOPE 10/19/2009   SYNCOPE, HX OF 10/19/2009   CANDIDIASIS OF THE ESOPHAGUS 06/17/2009   LEG PAIN, LEFT 12/22/2008   HAIR LOSS 06/03/2008   HEMORRHOIDS, INTERNAL, WITH BLEEDING 11/01/2007   VITAMIN B12 DEFICIENCY 09/05/2007   DYSPLASTIC NEVUS 08/14/2007   THROMBOCYTOSIS 07/03/2007   MELANOMA, TRUNK, HX OF  07/03/2007   HYPERCHOLESTEROLEMIA 05/31/2007   DISORDER, BIPOLAR NEC 05/31/2007   PSORIASIS 05/31/2007   INSOMNIA 05/31/2007   COCAINE ABUSE 05/14/2007   PERIODONTAL DISEASE 05/14/2007   Gastritis and gastroduodenitis 05/14/2007   IBS (irritable bowel syndrome) 05/14/2007   DIVERTICULOSIS, COLON, HX OF 05/14/2007   MYALGIA, HX OF 05/14/2007   PCP:  Sandi Mariscal, MD Pharmacy:   [id number] [place], [id number]: [phone]: [phone]    Social Determinants of Health (SDOH) Interventions    Readmission Risk Interventions     No data to display

## 2022-05-18 NOTE — Progress Notes (Signed)
Lab called and advised that blood hemolyzed. New order placed by lab to withdraw blood

## 2022-05-18 NOTE — Progress Notes (Signed)
PROGRESS NOTE    [name]  MEQ:[id number] DOB: [birth year] DOA: 05/16/2022 PCP: Sandi Mariscal, MD   Brief Narrative:  HPI: [name] is a 51 y.o. female with medical history significant of bipolar type 1, GAD,PTSD, THC abuse, gastritis, opioid dependence who presented to ED with weakness and possible unwitnessed syncopal event. She was found down on the ground with possible seizure like activity by her aunt. She tells me she doesn't remember anything from this event. She stopped taking all of her medication 1 week ago.  She has IBS-diarrhea and states her diarrhea is worse than normal and her stomach has hurt. She has had associated N/V and has had poor PO intake x 1 week. She states she hurts all over and feels clammy.    Per her aunt she was lying in her living room and screaming, "help me, help me!" She was convulsing. Her body looked like the worm dance movement where you go up and down. She didn't recognize her aunt and it appeared she was speaking to someone in the room. She seemed to be in a lot of pain. She was talking through entire event. She was able to get her back on the floor. She would try to get up off the floor. When EMS arrived she was speaking and answering questions to the EMS.    She states she feels sad and has struggled with suicide ideation, but denies any currently. No HI/AH/VH.    Denies any fever/chills, vision changes/headaches, chest pain or palpitations, shortness of breath or cough, dysuria or leg swelling.    She smokes 1 PPD, but stopped cold [place] a week ago when she stopped her medication, denies any alcohol use.    ER Course:  vitals: afebrile, bp: 127/87, HR; 89, RR: 18, oxygen: 100%RA Pertinent labs: UDS: +THC, 20 ketones in urine,  CXR: no acute finding CT head: no acute finding  In ED: given valium and zofran. [facility] asked to admit.     Assessment & Plan:   Principal Problem:   unwitnessed syncope Active Problems:   Benzodiazepine  withdrawal (HCC)   Opiate dependence (HCC)   Severe bipolar I disorder, current or most recent episode depressed (HCC)   IBS (irritable bowel syndrome)   Gastritis and gastroduodenitis   PTSD (post-traumatic stress disorder)   Cannabis abuse/tobacco abuse    Chronic acquired lymphedema  unwitnessed syncope: found down on ground for unknown period of time with possible seizure like activity in setting of stopping all medication, including klonopin and oxy, one week ago and poor PO intake with diarrhea/N/V -CT head with no acute finding.  Troponin negative.  Orthostatic positive.  Likely due to dehydration.  Doubt seizures.  Echo pending.  Continue IV fluids.  Hypokalemia: Replace.   Benzodiazepine vs opioid withdrawal [facility]): Patient tells me that she started having nausea vomiting diarrhea a week prior to stopping the medications, and then she felt very depressed and is stopped all her medications.  Currently she has horizontal and vertical nystagmus, pupils are partially dilated.  She complains of pain all over the body.  Appears little sweaty on the face as well.  Looks like she is likely having combination of opioid as well as benzodiazepine withdrawal.  She tells me that she has been prescribed Percocet 10/325 mg every 4 hours as needed but since about 1 to 2 months, she was only taking half a tablet 3 times daily.  She was also taking Klonopin 1 mg p.o. 3 times  daily.  I would resume her back on Percocet 5/325 mg every 6 hours as needed and also start her on half dose of Klonopin 0.5 mg 3 times daily.  Severe bipolar I disorder, current or most recent episode depressed (HCC)/previous history of suicidal attempt: Patient has extensive psychiatric history and attempt history of further suicide.  She tells me that last week when she stopped taking medications, she did have suicidal ideations but currently she has none.  She now understands that it was a mistake quit taking all her medications  suddenly.   Gastritis and gastroduodenitis Continue protonix and carafate    PTSD (post-traumatic stress disorder) Continue minipress    Cannabis abuse/tobacco abuse  Nicotine patch encouraged to quit    Chronic acquired lymphedema LE with erythema at baseline per patient. Would monitor closely TED hose   Musculoskeletal chest pain: Troponin negative.  Point tenderness on the chest.   DVT prophylaxis: Place TED hose Start: 05/17/22 1557 SCDs Start: 05/17/22 1540   Code Status: Full Code  Family Communication:  None present at bedside.  Plan of care discussed with patient in length and he/she verbalized understanding and agreed with it.  Status is: Observation The patient will require care spanning > 2 midnights and should be moved to inpatient because: Orthostatic positive.  Needs echo, PT OT assessment, further IV fluids.   Estimated body mass index is 36.13 kg/m as calculated from the following:   Height as of this encounter: 5' (1.524 m).   Weight as of 06/11/21: 83.9 kg.    Nutritional Assessment: Body mass index is 36.13 kg/m.[name] Seen by dietician.  I agree with the assessment and plan as outlined below: Nutrition Status:        . Skin Assessment: I have examined the patient's skin and I agree with the wound assessment as performed by the wound care RN as outlined below:    Consultants:  None  Procedures:  None  Antimicrobials:  Anti-infectives (From admission, onward)    None         Subjective: Patient seen and examined.  She states that she is feeling better however she is complaining of pain all over the body, mostly in the legs.  Somewhat in the chest as well.  No other complaint.  Objective: Vitals:   05/17/22 1852 05/17/22 2311 05/18/22 0100 05/18/22 0326  BP: (!) 148/78 125/88  122/67  Pulse: 84 91    Resp: 17 16    Temp: 98 F (36.7 C) 99 F (37.2 C)    TempSrc:  Oral    SpO2: 99% 100% 98%   Height:        Intake/Output  Summary (Last 24 hours) at 05/18/2022 1047 Last data filed at 05/18/2022 0600 Gross per 24 hour  Intake 0 ml  Output --  Net 0 ml   There were no vitals filed for this visit.  Examination:  General exam: Appears calm and comfortable  Respiratory system: Clear to auscultation. Respiratory effort normal.  Point tenderness on the anterior chest. Cardiovascular system: S1 & S2 heard, RRR. No JVD, murmurs, rubs, gallops or clicks. No pedal edema. Gastrointestinal system: Abdomen is nondistended, soft and nontender. No organomegaly or masses felt. Normal bowel sounds heard. Central nervous system: Alert and oriented. No focal neurological deficits. Extremities: Symmetric 5 x 5 power. Skin: No rashes, lesions or ulcers Psychiatry: Judgement and insight appear normal. Mood & affect appropriate.    Data Reviewed: I have personally reviewed following labs and imaging  studies  CBC: Recent Labs  Lab 05/16/22 2109 05/18/22 0109  WBC 12.9* 9.7  NEUTROABS 9.7*  --   HGB 16.1* 14.7  HCT 47.8* 44.3  MCV 94.7 94.1  PLT 366 086   Basic Metabolic Panel: Recent Labs  Lab 05/16/22 2109 05/17/22 1847 05/18/22 0626  NA 141  --  141  K 4.3  --  3.0*  CL 110  --  112*  CO2 20*  --  23  GLUCOSE 117*  --  114*  BUN 8  --  12  CREATININE 0.87  --  0.89  CALCIUM 9.8  --  8.9  MG  --  2.0  --    GFR: CrCl cannot be calculated (Unknown ideal weight.). Liver Function Tests: Recent Labs  Lab 05/16/22 2109  AST 15  ALT 14  ALKPHOS 60  BILITOT 0.6  PROT 7.9  ALBUMIN 4.2   No results for input(s): "LIPASE", "AMYLASE" in the last 168 hours. No results for input(s): "AMMONIA" in the last 168 hours. Coagulation Profile: No results for input(s): "INR", "PROTIME" in the last 168 hours. Cardiac Enzymes: Recent Labs  Lab 05/16/22 2109  CKTOTAL 69   BNP (last 3 results) No results for input(s): "PROBNP" in the last 8760 hours. HbA1C: No results for input(s): "HGBA1C" in the last 72  hours. CBG: No results for input(s): "GLUCAP" in the last 168 hours. Lipid Profile: No results for input(s): "CHOL", "HDL", "LDLCALC", "TRIG", "CHOLHDL", "LDLDIRECT" in the last 72 hours. Thyroid Function Tests: Recent Labs    05/17/22 1637  TSH 0.391   Anemia Panel: No results for input(s): "VITAMINB12", "FOLATE", "FERRITIN", "TIBC", "IRON", "RETICCTPCT" in the last 72 hours. Sepsis Labs: No results for input(s): "PROCALCITON", "LATICACIDVEN" in the last 168 hours.  No results found for this or any previous visit (from the past 240 hour(s)).   Radiology Studies: DG Chest 1 View  Result Date: 05/17/2022 CLINICAL DATA:  Shortness of breath EXAM: CHEST  1 VIEW COMPARISON:  06/12/2021 FINDINGS: Cardiac and mediastinal contours are within normal limits given AP technique. No focal pulmonary opacity. No pleural effusion or pneumothorax. No acute osseous abnormality. IMPRESSION: No acute cardiopulmonary process. Electronically Signed   By: Merilyn Baba M.D.   On: 05/17/2022 11:00   CT HEAD WO CONTRAST (5MM)  Result Date: 05/16/2022 CLINICAL DATA:  Syncope/presyncope, cerebrovascular cause suspected EXAM: CT HEAD WITHOUT CONTRAST TECHNIQUE: Contiguous axial images were obtained from the base of the skull through the vertex without intravenous contrast. RADIATION DOSE REDUCTION: This exam was performed according to the departmental dose-optimization program which includes automated exposure control, adjustment of the mA and/or kV according to patient size and/or use of iterative reconstruction technique. COMPARISON:  None Available. FINDINGS: Brain: Normal anatomic configuration. No abnormal intra or extra-axial mass lesion or fluid collection. No abnormal mass effect or midline shift. No evidence of acute intracranial hemorrhage or infarct. Ventricular size is normal. Cerebellum unremarkable. Vascular: Unremarkable Skull: Intact Sinuses/Orbits: Paranasal sinuses are clear. Orbits are unremarkable.  Other: Mastoid air cells and middle ear cavities are clear. IMPRESSION: No acute intracranial abnormality. Electronically Signed   By: Fidela Salisbury M.D.   On: 05/16/2022 22:43    Scheduled Meds:  clonazePAM  0.5 mg Oral TID   cyclobenzaprine  5 mg Oral TID   gabapentin  600 mg Oral TID   mirtazapine  15 mg Oral QHS   nicotine  21 mg Transdermal Daily   pantoprazole  40 mg Oral Daily   potassium chloride  40 mEq Oral Q4H   prazosin  2 mg Oral QHS   QUEtiapine  25 mg Oral BID   QUEtiapine  300 mg Oral QHS   sucralfate  1 g Oral TID WC & HS   Continuous Infusions:  sodium chloride 100 mL/hr at 05/17/22 1608     LOS: 0 days   Darliss Cheney, MD [facility]  05/18/2022, 10:47 AM   *Please note that this is a verbal dictation therefore any spelling or grammatical errors are due to the "[place] [employer]" system interpretation.  Please page via [place] and do not message via secure chat for urgent patient care matters. Secure chat can be used for non urgent patient care matters.  How to contact the [facility] Attending or Consulting provider [place] or covering provider during after hours [place], for this patient?  Check the care team in [facility] and look for a) attending/consulting [facility] provider listed and b) the [facility] team listed. Page or secure chat [phone]. Log into [email or web address] and use [place]'s universal password to access. If you do not have the password, please contact the hospital operator. Locate the [facility] provider you are looking for under [facility] and page to a number that you can be directly reached. If you still have difficulty reaching the provider, please page the [facility] (Director on Call) for the Hospitalists listed on amion for assistance.

## 2022-05-18 NOTE — Plan of Care (Signed)
  Problem: Education: Goal: Knowledge of General Education information will improve Description: Including pain rating scale, medication(s)/side effects and non-pharmacologic comfort measures Outcome: Progressing   Problem: Nutrition: Goal: Adequate nutrition will be maintained Outcome: Progressing   Problem: Coping: Goal: Level of anxiety will decrease Outcome: Progressing   
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
## 2022-05-18 NOTE — Progress Notes (Signed)
ANTICOAGULATION CONSULT NOTE - Initial Consult  Pharmacy Consult for enoxaparin Indication: VTE prophylaxis  Allergies  Allergen Reactions   Codeine Nausea And Vomiting    "Pt can take Vicodin if given with promethazine" "Pt can take Vicodin if given with promethazine"    Patient Measurements: 03/08/22 weight 178 lb  Height: 5' (152.4 cm) IBW/kg (Calculated) : 45.5   Vital Signs: BP: 122/67 (09/20 0326)  Labs: Recent Labs    05/16/22 2109 05/17/22 1637 05/17/22 1847 05/18/22 0109 05/18/22 0626  HGB 16.1*  --   --  14.7  --   HCT 47.8*  --   --  44.3  --   PLT 366  --   --  346  --   CREATININE 0.87  --   --   --  0.89  CKTOTAL 69  --   --   --   --   TROPONINIHS  --  3 3  --   --     CrCl cannot be calculated (Unknown ideal weight.).   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   Jaundice    MRSA (methicillin resistant Staphylococcus aureus)    hospitalized for 48 hours in Dec 2016   Staph infection    "in my blood"      Assessment: 51 yo W with BMI ~34.7. Pharmacy consulted for enoxaparin DVT ppx.   Goal of Therapy:   Monitor platelets by anticoagulation protocol: Yes   Plan:   Enoxaparin '40mg'$  q24hr Monitor for signs/symptoms of bleeding    Benetta Spar, PharmD, BCPS, BCCP Clinical Pharmacist  Please check AMION for all [place] phone numbers After 10:00 PM, call [place] [phone]

## 2022-05-19 DIAGNOSIS — R55 Syncope and collapse: Secondary | ICD-10-CM | POA: Diagnosis not present

## 2022-05-19 DIAGNOSIS — F1120 Opioid dependence, uncomplicated: Secondary | ICD-10-CM

## 2022-05-19 DIAGNOSIS — F13930 Sedative, hypnotic or anxiolytic use, unspecified with withdrawal, uncomplicated: Secondary | ICD-10-CM | POA: Diagnosis not present

## 2022-05-19 LAB — CBC WITH DIFFERENTIAL/PLATELET
Abs Immature Granulocytes: 0.02 10*3/uL (ref 0.00–0.07)
Basophils Absolute: 0 10*3/uL (ref 0.0–0.1)
Basophils Relative: 1 %
Eosinophils Absolute: 0.1 10*3/uL (ref 0.0–0.5)
Eosinophils Relative: 1 %
HCT: 42.8 % (ref 36.0–46.0)
Hemoglobin: 14 g/dL (ref 12.0–15.0)
Immature Granulocytes: 0 %
Lymphocytes Relative: 36 %
Lymphs Abs: 2.9 10*3/uL (ref 0.7–4.0)
MCH: 31.5 pg (ref 26.0–34.0)
MCHC: 32.7 g/dL (ref 30.0–36.0)
MCV: 96.2 fL (ref 80.0–100.0)
Monocytes Absolute: 0.6 10*3/uL (ref 0.1–1.0)
Monocytes Relative: 8 %
Neutro Abs: 4.4 10*3/uL (ref 1.7–7.7)
Neutrophils Relative %: 54 %
Platelets: 276 10*3/uL (ref 150–400)
RBC: 4.45 MIL/uL (ref 3.87–5.11)
RDW: 13.3 % (ref 11.5–15.5)
WBC: 8 10*3/uL (ref 4.0–10.5)
nRBC: 0 % (ref 0.0–0.2)

## 2022-05-19 LAB — BASIC METABOLIC PANEL
Anion gap: 4 — ABNORMAL LOW (ref 5–15)
BUN: 10 mg/dL (ref 6–20)
CO2: 25 mmol/L (ref 22–32)
Calcium: 8.9 mg/dL (ref 8.9–10.3)
Chloride: 112 mmol/L — ABNORMAL HIGH (ref 98–111)
Creatinine, Ser: 0.82 mg/dL (ref 0.44–1.00)
GFR, Estimated: >60 mL/min (ref >60)
Glucose, Bld: 103 mg/dL — ABNORMAL HIGH (ref 70–99)
Potassium: 3.9 mmol/L (ref 3.5–5.1)
Sodium: 141 mmol/L (ref 135–145)

## 2022-05-19 MED ORDER — SENNOSIDES-DOCUSATE SODIUM 8.6-50 MG PO TABS: 1.0000 | ORAL_TABLET | Freq: Every evening | ORAL | Status: DC | PRN

## 2022-05-19 MED ORDER — HYDRALAZINE HCL 20 MG/ML IJ SOLN: 10.0000 mg | INTRAMUSCULAR | Status: DC | PRN

## 2022-05-19 MED ORDER — IPRATROPIUM-ALBUTEROL 0.5-2.5 (3) MG/3ML IN SOLN: 3.0000 mL | RESPIRATORY_TRACT | Status: DC | PRN

## 2022-05-19 MED ORDER — METOPROLOL TARTRATE 5 MG/5ML IV SOLN: 5.0000 mg | INTRAVENOUS | Status: DC | PRN

## 2022-05-19 MED ORDER — GUAIFENESIN 100 MG/5ML PO LIQD: 5.0000 mL | ORAL | Status: DC | PRN

## 2022-05-19 MED ORDER — SODIUM CHLORIDE 0.9 % IV BOLUS
500.0000 mL | Freq: Once | INTRAVENOUS | Status: AC
  Administered 2022-05-19: 500 mL via INTRAVENOUS

## 2022-05-19 MED ORDER — PROMETHAZINE HCL 12.5 MG PO TABS
12.5000 mg | ORAL_TABLET | Freq: Four times a day (QID) | ORAL | Status: DC | PRN
  Administered 2022-05-21: 12.5 mg via ORAL
  Filled 2022-05-19 (×2): qty 1

## 2022-05-19 MED ORDER — SODIUM CHLORIDE 0.9 % IV SOLN: INTRAVENOUS | Status: AC

## 2022-05-19 MED ORDER — TRAZODONE HCL 50 MG PO TABS: 50.0000 mg | ORAL_TABLET | Freq: Every evening | ORAL | Status: DC | PRN

## 2022-05-19 NOTE — Evaluation (Addendum)
Occupational Therapy Evaluation Patient Details Name: [name] MRN: [id number] DOB: 1971/06/02 Today's Date: 05/19/2022   History of Present Illness 51 yo female presenting to ED on 9/19 with unwitnessed syncopal episode. Admitted for possible benzo withdrawal and dehydration. PMH: bipolar type 1, GAD, PTSD, THC abuse, opioid dependence.   Clinical Impression   PTA, pt was living alone and was independent. Currently, pt requires Supervision for ADLs and functional mobility. Pt presenting with decreased balance and activity tolerance; reports she feels "wobbly". No dizziness during session. Answered all pt questions. Recommend dc home once medically stable per physician. Will follow a cutely as admitted for safe dc.  HR 96. SpO2 100% on RA.   BP supine 132/87 (99), sitting 136/77 (96), standing 137/94 (106), sitting after walking 139/105 (117)   Recommendations for follow up therapy are one component of a multi-disciplinary discharge planning process, led by the attending physician.  Recommendations may be updated based on patient status, additional functional criteria and insurance authorization.   Follow Up Recommendations  No OT follow up    Assistance Recommended at Discharge PRN  Patient can return home with the following      Functional Status Assessment  Patient has had a recent decline in their functional status and demonstrates the ability to make significant improvements in function in a reasonable and predictable amount of time.  Equipment Recommendations  None recommended by OT    Recommendations for Other Services       Precautions / Restrictions Precautions Precautions: Fall Precaution Comments: Watch BP Restrictions Weight Bearing Restrictions: No      Mobility Bed Mobility Overal bed mobility: Independent                  Transfers Overall transfer level: Needs assistance Equipment used: None Transfers: Sit to/from Stand Sit to Stand:  Supervision                  Balance Overall balance assessment: Mild deficits observed, not formally tested                                         ADL either performed or assessed with clinical judgement   ADL Overall ADL's : Needs assistance/impaired                                       General ADL Comments: Pt performing ADLs and functional mobility with Supervision. Pt performing LB dressing, mobility in room, and self feeding breakfast     Vision Baseline Vision/History: 1 Wears glasses ("im suppose to")       Perception     Praxis      Pertinent Vitals/Pain Pain Assessment Pain Assessment: Faces Faces Pain Scale: Hurts little more Pain Location: "All over" Pain Descriptors / Indicators: Sore Pain Intervention(s): Monitored during session, Limited activity within patient's tolerance, Repositioned     Hand Dominance Right   Extremity/Trunk Assessment Upper Extremity Assessment Upper Extremity Assessment: Overall WFL for tasks assessed   Lower Extremity Assessment Lower Extremity Assessment: Overall WFL for tasks assessed   Cervical / Trunk Assessment Cervical / Trunk Assessment: Normal   Communication Communication Communication: No difficulties   Cognition Arousal/Alertness: Awake/alert Behavior During Therapy: WFL for tasks assessed/performed Overall Cognitive Status: Within Functional Limits for tasks assessed  General Comments  HR 96. Spo2 100% on RA.  BP supine 132/87 (99), sitting 136/77 (96), standing 137/94 (106), sitting after walking 139/105 (117)    Exercises     Shoulder Instructions      Home Living Family/patient expects to be discharged to:: Private residence Living Arrangements: Alone   Type of Home: House Home Access: Stairs to enter [employer] of Steps: 4 garage and 6 at front Entrance Stairs-Rails: Left ([place]) Home  Layout: One level     Bathroom Shower/Tub: Occupational psychologist: Standard     Home Equipment: Advice worker (2 wheels);Cane - single point;Wheelchair - manual   Additional Comments: Dog named biscuit - rat terrier and blood hound      Prior Functioning/Environment Prior Level of Function : Independent/Modified Independent;Driving             Mobility Comments: No DME ADLs Comments: ADLs, IADLs, and driving. on disability        OT Problem List: Decreased activity tolerance;Decreased knowledge of precautions;Decreased knowledge of use of DME or AE      OT Treatment/Interventions:      OT Goals(Current goals can be found in the care plan section) Acute Rehab OT Goals Patient Stated Goal: Go home OT Goal Formulation: With patient Time For Goal Achievement: 06/02/22 Potential to Achieve Goals: Good  OT Frequency:      Co-evaluation              AM-PAC OT "6 Clicks" Daily Activity     Outcome Measure Help from another person eating meals?: None Help from another person taking care of personal grooming?: None Help from another person toileting, which includes using toliet, bedpan, or urinal?: A Little Help from another person bathing (including washing, rinsing, drying)?: A Little Help from another person to put on and taking off regular upper body clothing?: A Little Help from another person to put on and taking off regular lower body clothing?: A Little 6 Click Score: 20   End of Session Nurse Communication: Mobility status  Activity Tolerance: Patient tolerated treatment well Patient left: in chair;with call bell/phone within reach;with chair alarm set  OT Visit Diagnosis: Muscle weakness (generalized) (M62.81)                Time: 9024-0973 OT Time Calculation (min): 21 min Charges:  OT General Charges $OT Visit: 1 Visit OT Evaluation $OT Eval Low Complexity: 1 Low  [name] MSOT, OTR/L Acute Rehab Office:  [place] 05/19/2022, 8:54 AM

## 2022-05-19 NOTE — Social Work (Signed)
MD requested counseling and grief resources for pt, when she is ready to discharge. Resources were added to the AVS for pt's convenience. TOC is available for any further needs.

## 2022-05-19 NOTE — Plan of Care (Signed)

## 2022-05-19 NOTE — Progress Notes (Signed)
PROGRESS NOTE    [name]  XIP:[id number] DOB: [birth year] DOA: 05/16/2022 PCP: Sandi Mariscal, MD   Brief Narrative:  51 year old with history of bipolar, GAD, PTSD, THC use, gastritis, opioid dependence came to the ED with weakness and unwitnessed syncopal event.  Initial concerns of seizure-like activity but later thought to be also from underlying psychiatry issues therefore psychiatry team consulted.   Assessment & Plan:  Principal Problem:   unwitnessed syncope Active Problems:   Benzodiazepine withdrawal (HCC)   Opiate dependence (HCC)   Severe bipolar I disorder, current or most recent episode depressed (HCC)   IBS (irritable bowel syndrome)   Gastritis and gastroduodenitis   PTSD (post-traumatic stress disorder)   Cannabis abuse/tobacco abuse    Chronic acquired lymphedema   unwitnessed syncope Orthostasis - Patient is currently getting IV fluids.  CT of the head is negative.  Concerns of withdrawal, low suspicion for seizure activity.  Supportive care.  Recently had poor oral intake.  UDS positive for THC.  Alcohol, salicylate negative.  TSH normal. - Echocardiogram-EF 60%   Hypokalemia -Replete as needed   Benzodiazepine vs opioid withdrawal (HCC) -Suspicion for withdrawal as patient had gastroenteritis therefore stopped taking medication leading to worsening of depression and stopping her medications.   Severe bipolar I disorder PTSD - Seen by psychiatry team.  Currently on Seroquel 25 mg twice daily and 30 mg at bedtime.  Prazosin 2 mg at bedtime.  Klonopin 0.5 mg 3 times daily, mirtazapine 15 mg at bedtime and gabapentin 600 mg 3 times daily.   Gastritis and gastroduodenitis -On Protonix daily and Carafate   Cannabis abuse/tobacco abuse  -Nicotine patch as needed   Chronic acquired lymphedema -Chronic.  Compression stocking if possible, elevate legs whenever possible   Musculoskeletal chest pain -Atypical.  Troponins remain negative.  DVT prophylaxis:  Lovenox Code Status: Full code Family Communication:    Status is: Inpatient Still feeling quite dizzy getting IV fluids   Subjective: During my visit patient tells me she feels quite dizzy and lightheaded especially when she gets up and moves around.   Examination:  General exam: Appears calm and comfortable  Respiratory system: Clear to auscultation. Respiratory effort normal. Cardiovascular system: S1 & S2 heard, RRR. No JVD, murmurs, rubs, gallops or clicks. No pedal edema. Gastrointestinal system: Abdomen is nondistended, soft and nontender. No organomegaly or masses felt. Normal bowel sounds heard. Central nervous system: Alert and oriented. No focal neurological deficits. Extremities: Symmetric 5 x 5 power. Skin: No rashes, lesions or ulcers Psychiatry: Judgement and insight appear normal. Mood & affect appropriate.     Objective: Vitals:   05/18/22 1930 05/18/22 1940 05/18/22 1950 05/18/22 2000  BP:   125/77   Pulse:   93 86  Resp: '20 20 14 19  '$ Temp:   98.1 F (36.7 C)   TempSrc:   Oral   SpO2:   97% 98%  Height:       No intake or output data in the 24 hours ending 05/19/22 0813 There were no vitals filed for this visit.   Data Reviewed:   CBC: Recent Labs  Lab 05/16/22 2109 05/18/22 0109 05/19/22 0124  WBC 12.9* 9.7 8.0  NEUTROABS 9.7*  --  4.4  HGB 16.1* 14.7 14.0  HCT 47.8* 44.3 42.8  MCV 94.7 94.1 96.2  PLT 366 346 673   Basic Metabolic Panel: Recent Labs  Lab 05/16/22 2109 05/17/22 1847 05/18/22 0626 05/19/22 0124  NA 141  --  141 141  K 4.3  --  3.0* 3.9  CL 110  --  112* 112*  CO2 20*  --  23 25  GLUCOSE 117*  --  114* 103*  BUN 8  --  12 10  CREATININE 0.87  --  0.89 0.82  CALCIUM 9.8  --  8.9 8.9  MG  --  2.0  --   --    GFR: CrCl cannot be calculated (Unknown ideal weight.). Liver Function Tests: Recent Labs  Lab 05/16/22 2109  AST 15  ALT 14  ALKPHOS 60  BILITOT 0.6  PROT 7.9  ALBUMIN 4.2   No results for  input(s): "LIPASE", "AMYLASE" in the last 168 hours. No results for input(s): "AMMONIA" in the last 168 hours. Coagulation Profile: No results for input(s): "INR", "PROTIME" in the last 168 hours. Cardiac Enzymes: Recent Labs  Lab 05/16/22 2109  CKTOTAL 69   BNP (last 3 results) No results for input(s): "PROBNP" in the last 8760 hours. HbA1C: No results for input(s): "HGBA1C" in the last 72 hours. CBG: No results for input(s): "GLUCAP" in the last 168 hours. Lipid Profile: No results for input(s): "CHOL", "HDL", "LDLCALC", "TRIG", "CHOLHDL", "LDLDIRECT" in the last 72 hours. Thyroid Function Tests: Recent Labs    05/17/22 1637  TSH 0.391   Anemia Panel: No results for input(s): "VITAMINB12", "FOLATE", "FERRITIN", "TIBC", "IRON", "RETICCTPCT" in the last 72 hours. Sepsis Labs: No results for input(s): "PROCALCITON", "LATICACIDVEN" in the last 168 hours.  No results found for this or any previous visit (from the past 240 hour(s)).       Radiology Studies: ECHOCARDIOGRAM COMPLETE  Result Date: 05/18/2022    ECHOCARDIOGRAM REPORT   Patient Name:   [name] Date of Exam: 05/18/2022 Medical Rec #:  [id number]        Height:       60.0 in Accession #:    [id number]       Weight:       185.0 lb Date of Birth:  June 13, 1971       BSA:          1.806 m Patient Age:    44 years         BP:           122/67 mmHg Patient Gender: F                HR:           90 bpm. Exam Location:  Inpatient Procedure: 2D Echo, Cardiac Doppler and Color Doppler Indications:    Syncope  History:        Patient has no prior history of Echocardiogram examinations.  Sonographer:    Memory Argue Referring Phys: [id number] [place]  1. Left ventricular ejection fraction, by estimation, is 60 to 65%. The left ventricle has normal function. The left ventricle has no regional wall motion abnormalities. Left ventricular diastolic parameters were normal.  2. Right ventricular systolic function is  normal. The right ventricular size is normal.  3. The mitral valve is grossly normal. No evidence of mitral valve regurgitation. No evidence of mitral stenosis.  4. The aortic valve is grossly normal. Aortic valve regurgitation is not visualized. No aortic stenosis is present.  5. The inferior vena cava is normal in size with greater than 50% respiratory variability, suggesting right atrial pressure of 3 mmHg. Comparison(s): No prior Echocardiogram. Conclusion(s)/Recommendation(s): Normal biventricular function without evidence of hemodynamically significant valvular heart disease. FINDINGS  Left Ventricle: Left  ventricular ejection fraction, by estimation, is 60 to 65%. The left ventricle has normal function. The left ventricle has no regional wall motion abnormalities. The left ventricular internal cavity size was normal in size. There is  no left ventricular hypertrophy. Left ventricular diastolic parameters were normal. Right Ventricle: The right ventricular size is normal. Right vetricular wall thickness was not well visualized. Right ventricular systolic function is normal. Left Atrium: Left atrial size was normal in size. Right Atrium: Right atrial size was normal in size. Pericardium: Trivial pericardial effusion is present. Mitral Valve: The mitral valve is grossly normal. No evidence of mitral valve regurgitation. No evidence of mitral valve stenosis. Tricuspid Valve: The tricuspid valve is grossly normal. Tricuspid valve regurgitation is not demonstrated. No evidence of tricuspid stenosis. Aortic Valve: The aortic valve is grossly normal. Aortic valve regurgitation is not visualized. No aortic stenosis is present. Aortic valve mean gradient measures 3.0 mmHg. Aortic valve peak gradient measures 6.4 mmHg. Aortic valve area, by VTI measures 1.80 cm. Pulmonic Valve: The pulmonic valve was not well visualized. Pulmonic valve regurgitation is not visualized. No evidence of pulmonic stenosis. Aorta: The aortic  root, ascending aorta and aortic arch are all structurally normal, with no evidence of dilitation or obstruction. Venous: The inferior vena cava is normal in size with greater than 50% respiratory variability, suggesting right atrial pressure of 3 mmHg. IAS/Shunts: The interatrial septum appears to be lipomatous. The interatrial septum was not well visualized.  LEFT VENTRICLE PLAX 2D LVIDd:         4.10 cm   Diastology LVIDs:         2.90 cm   LV e' medial:    7.07 cm/s LV PW:         1.00 cm   LV E/e' medial:  10.5 LV IVS:        1.00 cm   LV e' lateral:   7.72 cm/s LVOT diam:     2.00 cm   LV E/e' lateral: 9.6 LV SV:         37 LV SV Index:   21 LVOT Area:     3.14 cm  RIGHT VENTRICLE TAPSE (M-mode): 1.8 cm LEFT ATRIUM             Index        RIGHT ATRIUM           Index LA diam:        2.40 cm 1.33 cm/m   RA Area:     11.00 cm LA Vol (A2C):   37.3 ml 20.66 ml/m  RA Volume:   23.80 ml  13.18 ml/m LA Vol (A4C):   35.4 ml 19.60 ml/m LA Biplane Vol: 37.7 ml 20.88 ml/m  AORTIC VALVE AV Area (Vmax):    1.90 cm AV Area (Vmean):   1.79 cm AV Area (VTI):     1.80 cm AV Vmax:           126.00 cm/s AV Vmean:          85.500 cm/s AV VTI:            0.206 m AV Peak Grad:      6.4 mmHg AV Mean Grad:      3.0 mmHg LVOT Vmax:         76.40 cm/s LVOT Vmean:        48.700 cm/s LVOT VTI:          0.118 m LVOT/AV VTI ratio: 0.57  AORTA  Ao Root diam: 3.20 cm Ao Asc diam:  2.40 cm MITRAL VALVE MV Area (PHT): 4.21 cm    SHUNTS MV Decel Time: 180 msec    Systemic VTI:  0.12 m MV E velocity: 74.40 cm/s  Systemic Diam: 2.00 cm MV A velocity: 87.90 cm/s MV E/A ratio:  0.85 Buford Dresser MD Electronically signed by Buford Dresser MD Signature Date/Time: 05/18/2022/3:36:18 PM    Final    DG Chest 1 View  Result Date: 05/17/2022 CLINICAL DATA:  Shortness of breath EXAM: CHEST  1 VIEW COMPARISON:  06/12/2021 FINDINGS: Cardiac and mediastinal contours are within normal limits given AP technique. No focal pulmonary  opacity. No pleural effusion or pneumothorax. No acute osseous abnormality. IMPRESSION: No acute cardiopulmonary process. Electronically Signed   By: Merilyn Baba M.D.   On: 05/17/2022 11:00        Scheduled Meds:  clonazePAM  0.5 mg Oral TID   cyclobenzaprine  5 mg Oral TID   enoxaparin (LOVENOX) injection  40 mg Subcutaneous Daily   gabapentin  600 mg Oral TID   mirtazapine  15 mg Oral QHS   nicotine  21 mg Transdermal Daily   pantoprazole  40 mg Oral Daily   prazosin  2 mg Oral QHS   QUEtiapine  25 mg Oral BID   QUEtiapine  300 mg Oral QHS   sucralfate  1 g Oral TID WC & HS   Continuous Infusions:  sodium chloride 100 mL/hr at 05/18/22 2005     LOS: 1 day   Time spent= 35 mins    Talishia Betzler Arsenio Loader, MD [facility]  If 7PM-7AM, please contact night-coverage  05/19/2022, 8:13 AM

## 2022-05-19 NOTE — Discharge Instructions (Addendum)
Recommendations for Outpatient Follow-up:  Follow up with PCP in 1-2 weeks Please obtain BMP/CBC in one week your next doctors visit.  Psychiatry meds adjusted as listed below     Outpatient Psychiatry and Counseling  Therapeutic Alternatives: Mobile Crisis Management:  [facility] [place])         [address] [place], [place] [address] [phone]  [place] sliding scale fee and walk in schedule: M-F 8am-12pm/1pm-3pm [place], [place] [address] [facility] [place], [place] [address] [phone]  [facility]   [facility] [address]   [address] [place], [place] [address]    [place], [place] [address] [phone]     [phone]  [facility] [address]  [place] [address]    [place] [address] [phone]     [place], [place] [address]    [place] [address]    [place] [address] [phone]       [phone]  [facility]   [facility] [address] [place]     [place], [place]     [place] [phone] E. [place], MD [place], [place] [address] [place] [phone]     [place], [place] [address] [phone] [facility]: ([place] speakin) [phone]  [facility]    [facility] [address]    [place], [place] [address]    [place], [place]  [address] [phone]     [phone]
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
## 2022-05-19 NOTE — Evaluation (Signed)
Physical Therapy Evaluation Patient Details Name: [name] MRN: [id number] DOB: Jan 28, 1971 Today's Date: 05/19/2022  History of Present Illness  51 yo female presenting to ED on 9/19 with unwitnessed syncopal episode. Admitted for possible benzo withdrawal and dehydration. PMH: bipolar type 1, GAD, PTSD, THC abuse, opioid dependence.  Clinical Impression  Patient admitted with the above. PTA, patient lives alone and reports independence. Patient presents with weakness, impaired balance, and decreased activity tolerance. Patient functioning at supervision level for mobility with no AD. VSS with transitional movements and denies dizziness. Anticipate patient will progress quickly back to baseline. Patient will benefit from skilled PT services during acute stay to address listed deficits. No PT follow up recommended at this time.        Recommendations for follow up therapy are one component of a multi-disciplinary discharge planning process, led by the attending physician.  Recommendations may be updated based on patient status, additional functional criteria and insurance authorization.  Follow Up Recommendations No PT follow up      Assistance Recommended at Discharge PRN  Patient can return home with the following  Direct supervision/assist for medications management;Direct supervision/assist for financial management;Assist for transportation    Equipment Recommendations None recommended by PT  Recommendations for Other Services       Functional Status Assessment Patient has had a recent decline in their functional status and demonstrates the ability to make significant improvements in function in a reasonable and predictable amount of time.     Precautions / Restrictions Precautions Precautions: Fall Precaution Comments: Watch BP Restrictions Weight Bearing Restrictions: No      Mobility  Bed Mobility Overal bed mobility: Independent                   Transfers Overall transfer level: Needs assistance (Simultaneous filing. User may not have seen previous data.) Equipment used: None Transfers: Sit to/from Stand Sit to Stand: Supervision                Ambulation/Gait Ambulation/Gait assistance: Supervision Gait Distance (Feet): 50 Feet Assistive device: None Gait Pattern/deviations: Step-through pattern, Decreased stride length Gait velocity: decreased     General Gait Details: supervision for safety. General unsteadiness but no overt LOB  Stairs            Wheelchair Mobility    Modified Rankin (Stroke Patients Only)       Balance Overall balance assessment: Mild deficits observed, not formally tested                                           Pertinent Vitals/Pain Pain Assessment Faces Pain Scale: Hurts little more Pain Location: "All over" Pain Descriptors / Indicators: Sore    Home Living Family/patient expects to be discharged to:: Private residence Living Arrangements: Alone   Type of Home: House Home Access: Stairs to enter Entrance Stairs-Rails: Left ([place]obb[place]) Entrance Stairs-Number of Steps: 4 garage and 6 at front   Home Layout: One level Home Equipment: Advice worker (2 wheels);Cane - single point;Wheelchair - manual Additional Comments: Dog named biscuit - rat terrier and blood hound    Prior Function Prior Level of Function : Independent/Modified Independent;Driving             Mobility Comments: No DME ADLs Comments: ADLs, IADLs, and driving. on disability     Hand Dominance   Dominant Hand: Right  Extremity/Trunk Assessment   Upper Extremity Assessment Upper Extremity Assessment: Overall WFL for tasks assessed    Lower Extremity Assessment Lower Extremity Assessment: Overall WFL for tasks assessed    Cervical / Trunk Assessment Cervical / Trunk Assessment: Normal  Communication   Communication: No difficulties  Cognition  Arousal/Alertness: Awake/alert Behavior During Therapy: WFL for tasks assessed/performed Overall Cognitive Status: Within Functional Limits for tasks assessed                                          General Comments General comments (skin integrity, edema, etc.): HR 96. Spo2 100% on RA.  BP supine 132/87 (99), sitting 136/77 (96), standing 137/94 (106), sitting after walking 139/105 (117)    Exercises     Assessment/Plan    PT Assessment Patient needs continued PT services  PT Problem List Decreased strength;Decreased activity tolerance;Decreased balance;Decreased mobility;Decreased cognition;Decreased safety awareness       PT Treatment Interventions DME instruction;Gait training;Functional mobility training;Therapeutic activities;Balance training;Therapeutic exercise;Patient/family education    PT Goals (Current goals can be found in the Care Plan section)  Acute Rehab PT Goals Patient Stated Goal: to get better PT Goal Formulation: With patient Time For Goal Achievement: 06/02/22 Potential to Achieve Goals: Good    Frequency Min 3X/week     Co-evaluation               AM-PAC PT "6 Clicks" Mobility  Outcome Measure Help needed turning from your back to your side while in a flat bed without using bedrails?: None Help needed moving from lying on your back to sitting on the side of a flat bed without using bedrails?: None Help needed moving to and from a bed to a chair (including a wheelchair)?: None Help needed standing up from a chair using your arms (e.g., wheelchair or bedside chair)?: None Help needed to walk in hospital room?: A Little Help needed climbing 3-5 steps with a railing? : A Little 6 Click Score: 22    End of Session   Activity Tolerance: Patient tolerated treatment well Patient left: in chair;with call bell/phone within reach;with chair alarm set Nurse Communication: Mobility status PT Visit Diagnosis: Muscle weakness  (generalized) (M62.81);Unsteadiness on feet (R26.81)    Time: 8638-1771 PT Time Calculation (min) (ACUTE ONLY): 21 min   Charges:   PT Evaluation $PT Eval Low Complexity: 1 Low          [place] Throckmorton A. Gilford Rile PT, DPT Acute Rehabilitation Services Office [phone]   [name] 05/19/2022, 9:13 AM

## 2022-05-20 DIAGNOSIS — K2970 Gastritis, unspecified, without bleeding: Secondary | ICD-10-CM | POA: Diagnosis not present

## 2022-05-20 DIAGNOSIS — K2990 Gastroduodenitis, unspecified, without bleeding: Secondary | ICD-10-CM

## 2022-05-20 DIAGNOSIS — R55 Syncope and collapse: Secondary | ICD-10-CM | POA: Diagnosis not present

## 2022-05-20 LAB — BASIC METABOLIC PANEL
Anion gap: 5 (ref 5–15)
BUN: 12 mg/dL (ref 6–20)
CO2: 24 mmol/L (ref 22–32)
Calcium: 8.2 mg/dL — ABNORMAL LOW (ref 8.9–10.3)
Chloride: 113 mmol/L — ABNORMAL HIGH (ref 98–111)
Creatinine, Ser: 0.97 mg/dL (ref 0.44–1.00)
GFR, Estimated: >60 mL/min (ref >60)
Glucose, Bld: 98 mg/dL (ref 70–99)
Potassium: 3.3 mmol/L — ABNORMAL LOW (ref 3.5–5.1)
Sodium: 142 mmol/L (ref 135–145)

## 2022-05-20 LAB — CBC
HCT: 36.6 % (ref 36.0–46.0)
Hemoglobin: 12.1 g/dL (ref 12.0–15.0)
MCH: 31.4 pg (ref 26.0–34.0)
MCHC: 33.1 g/dL (ref 30.0–36.0)
MCV: 95.1 fL (ref 80.0–100.0)
Platelets: 257 10*3/uL (ref 150–400)
RBC: 3.85 MIL/uL — ABNORMAL LOW (ref 3.87–5.11)
RDW: 13.2 % (ref 11.5–15.5)
WBC: 6.6 10*3/uL (ref 4.0–10.5)
nRBC: 0 % (ref 0.0–0.2)

## 2022-05-20 LAB — MAGNESIUM: Magnesium: 1.8 mg/dL (ref 1.7–2.4)

## 2022-05-20 MED ORDER — POTASSIUM CHLORIDE CRYS ER 20 MEQ PO TBCR
40.0000 meq | EXTENDED_RELEASE_TABLET | Freq: Once | ORAL | Status: AC
  Administered 2022-05-20: 40 meq via ORAL
  Filled 2022-05-20: qty 2

## 2022-05-20 MED ORDER — POTASSIUM CHLORIDE 10 MEQ/100ML IV SOLN
10.0000 meq | INTRAVENOUS | Status: AC
  Administered 2022-05-20: 10 meq via INTRAVENOUS
  Filled 2022-05-20: qty 100

## 2022-05-20 NOTE — Plan of Care (Signed)
  Problem: Education: Goal: Knowledge of General Education information will improve Description: Including pain rating scale, medication(s)/side effects and non-pharmacologic comfort measures Outcome: Progressing   Problem: Clinical Measurements: Goal: Ability to maintain clinical measurements within normal limits will improve Outcome: Progressing Goal: Diagnostic test results will improve Outcome: Progressing   Problem: Nutrition: Goal: Adequate nutrition will be maintained Outcome: Progressing   Problem: Coping: Goal: Level of anxiety will decrease Outcome: Progressing   Problem: Elimination: Goal: Will not experience complications related to bowel motility Outcome: Progressing   Problem: Pain Managment: Goal: General experience of comfort will improve Outcome: Progressing

## 2022-05-20 NOTE — Progress Notes (Signed)
PROGRESS NOTE    [name]  RJJ:[id number] DOB: [birth year] DOA: 05/16/2022 PCP: Sandi Mariscal, MD   Brief Narrative:  51 year old with history of bipolar, GAD, PTSD, THC use, gastritis, opioid dependence came to the ED with weakness and unwitnessed syncopal event.  Initial concerns of seizure-like activity but later thought to be also from underlying psychiatry issues therefore psychiatry team consulted.  Her medications were adjusted and cleared for discharge.  During hospitalization she also had postural dizziness requiring IV fluids.   Assessment & Plan:  Principal Problem:   unwitnessed syncope Active Problems:   Benzodiazepine withdrawal (HCC)   Opiate dependence (HCC)   Severe bipolar I disorder, current or most recent episode depressed (HCC)   IBS (irritable bowel syndrome)   Gastritis and gastroduodenitis   PTSD (post-traumatic stress disorder)   Cannabis abuse/tobacco abuse    Chronic acquired lymphedema   unwitnessed syncope Orthostasis - She is still orthostatic, continue IV fluids aggressively.  CT of the head is negative.  Concerns of withdrawal, low suspicion for seizure activity.  Supportive care.  Recently had poor oral intake.  UDS positive for THC.  Alcohol, salicylate negative.  TSH normal. - Echocardiogram-EF 60%   Hypokalemia -Replete as needed   Benzodiazepine vs opioid withdrawal (HCC) -Suspicion for withdrawal as patient had gastroenteritis therefore stopped taking medication leading to worsening of depression and stopping her medications.   Severe bipolar I disorder PTSD - Seen by psychiatry team, she is now cleared.  Currently on Seroquel 25 mg twice daily and 30 mg at bedtime.  Prazosin 2 mg at bedtime.  Klonopin 0.5 mg 3 times daily, mirtazapine 15 mg at bedtime and gabapentin 600 mg 3 times daily.   Gastritis and gastroduodenitis -On Protonix daily and Carafate   Cannabis abuse/tobacco abuse  -Nicotine patch as needed   Chronic acquired  lymphedema -Chronic.  Compression stocking if possible, elevate legs whenever possible   Musculoskeletal chest pain -Atypical.  Troponins remain negative.  DVT prophylaxis: Lovenox Code Status: Full code Family Communication:    Status is: Inpatient Patient is orthostatic with significant dizziness with standing up.  Continue hospital stay on aggressive IV fluids  Subjective: Significant postural dizziness causing some mobility issues at time.   Examination:  G constitutional: Not in acute distress Respiratory: Clear to auscultation bilaterally Cardiovascular: Normal sinus rhythm, no rubs Abdomen: Nontender nondistended good bowel sounds Musculoskeletal: No edema noted Skin: No rashes seen Neurologic: CN 2-12 grossly intact.  And nonfocal Psychiatric: Normal judgment and insight. Alert and oriented x 3. Normal mood.  Objective: Vitals:   05/19/22 2000 05/19/22 2100 05/19/22 2200 05/20/22 0900  BP:   123/89 125/85  Pulse: 87 90 82 86  Resp:    17  Temp:   98.1 F (36.7 C) 98.1 F (36.7 C)  TempSrc:   Oral Oral  SpO2: 97% 100% 100% 100%  Height:       No intake or output data in the 24 hours ending 05/20/22 1307 There were no vitals filed for this visit.   Data Reviewed:   CBC: Recent Labs  Lab 05/16/22 2109 05/18/22 0109 05/19/22 0124 05/20/22 0051  WBC 12.9* 9.7 8.0 6.6  NEUTROABS 9.7*  --  4.4  --   HGB 16.1* 14.7 14.0 12.1  HCT 47.8* 44.3 42.8 36.6  MCV 94.7 94.1 96.2 95.1  PLT 366 346 276 016   Basic Metabolic Panel: Recent Labs  Lab 05/16/22 2109 05/17/22 1847 05/18/22 0626 05/19/22 0124 05/20/22 0051  NA 141  --  141 141 142  K 4.3  --  3.0* 3.9 3.3*  CL 110  --  112* 112* 113*  CO2 20*  --  '23 25 24  '$ GLUCOSE 117*  --  114* 103* 98  BUN 8  --  '12 10 12  '$ CREATININE 0.87  --  0.89 0.82 0.97  CALCIUM 9.8  --  8.9 8.9 8.2*  MG  --  2.0  --   --  1.8   GFR: CrCl cannot be calculated (Unknown ideal weight.). Liver Function Tests: Recent  Labs  Lab 05/16/22 2109  AST 15  ALT 14  ALKPHOS 60  BILITOT 0.6  PROT 7.9  ALBUMIN 4.2   No results for input(s): "LIPASE", "AMYLASE" in the last 168 hours. No results for input(s): "AMMONIA" in the last 168 hours. Coagulation Profile: No results for input(s): "INR", "PROTIME" in the last 168 hours. Cardiac Enzymes: Recent Labs  Lab 05/16/22 2109  CKTOTAL 69   BNP (last 3 results) No results for input(s): "PROBNP" in the last 8760 hours. HbA1C: No results for input(s): "HGBA1C" in the last 72 hours. CBG: No results for input(s): "GLUCAP" in the last 168 hours. Lipid Profile: No results for input(s): "CHOL", "HDL", "LDLCALC", "TRIG", "CHOLHDL", "LDLDIRECT" in the last 72 hours. Thyroid Function Tests: Recent Labs    05/17/22 1637  TSH 0.391   Anemia Panel: No results for input(s): "VITAMINB12", "FOLATE", "FERRITIN", "TIBC", "IRON", "RETICCTPCT" in the last 72 hours. Sepsis Labs: No results for input(s): "PROCALCITON", "LATICACIDVEN" in the last 168 hours.  No results found for this or any previous visit (from the past 240 hour(s)).       Radiology Studies: No results found.      Scheduled Meds:  clonazePAM  0.5 mg Oral TID   cyclobenzaprine  5 mg Oral TID   enoxaparin (LOVENOX) injection  40 mg Subcutaneous Daily   gabapentin  600 mg Oral TID   mirtazapine  15 mg Oral QHS   nicotine  21 mg Transdermal Daily   pantoprazole  40 mg Oral Daily   prazosin  2 mg Oral QHS   QUEtiapine  25 mg Oral BID   QUEtiapine  300 mg Oral QHS   sucralfate  1 g Oral TID WC & HS   Continuous Infusions:  sodium chloride 100 mL/hr at 05/19/22 1712     LOS: 2 days   Time spent= 35 mins    [place]croft Arsenio Loader, MD [facility]  If 7PM-7AM, please contact night-coverage  05/20/2022, 1:07 PM

## 2022-05-20 NOTE — Plan of Care (Signed)
  Problem: Education: Goal: Knowledge of General Education information will improve Description: Including pain rating scale, medication(s)/side effects and non-pharmacologic comfort measures Outcome: Progressing   Problem: Clinical Measurements: Goal: Diagnostic test results will improve Outcome: Progressing   Problem: Activity: Goal: Risk for activity intolerance will decrease Outcome: Progressing   Problem: Nutrition: Goal: Adequate nutrition will be maintained Outcome: Progressing   Problem: Coping: Goal: Level of anxiety will decrease Outcome: Progressing

## 2022-05-20 NOTE — Care Management Important Message (Signed)
Important Message  Patient Details  Name: [name] MRN: [id number] Date of Birth: 03/10/1971   Medicare Important Message Given:  Yes     Hannah Beat 05/20/2022, 11:46 AM

## 2022-05-20 NOTE — Progress Notes (Signed)
Physical Therapy Treatment Patient Details Name: [name] MRN: [id number] DOB: 06-21-1971 Today's Date: 05/20/2022   History of Present Illness 51 yo female presenting to ED on 9/19 with unwitnessed syncopal episode. Admitted for possible benzo withdrawal and dehydration. PMH: bipolar type 1, GAD, PTSD, THC abuse, opioid dependence.    PT Comments    Patient able to progress ambulation distance with minimal dizziness which subsides with standing rest break. Overall functioning at supervision level with use of IV pole for mobility. Educated patient about placing chairs around her house in case she needs to sit down due to dizziness, patient verbalized understanding. No PT follow up recommended at this time.     Recommendations for follow up therapy are one component of a multi-disciplinary discharge planning process, led by the attending physician.  Recommendations may be updated based on patient status, additional functional criteria and insurance authorization.  Follow Up Recommendations  No PT follow up     Assistance Recommended at Discharge PRN  Patient can return home with the following Direct supervision/assist for medications management;Direct supervision/assist for financial management;Assist for transportation   Equipment Recommendations  None recommended by PT    Recommendations for Other Services       Precautions / Restrictions Precautions Precautions: Fall Restrictions Weight Bearing Restrictions: No     Mobility  Bed Mobility Overal bed mobility: Independent                  Transfers Overall transfer level: Needs assistance Equipment used: None Transfers: Sit to/from Stand Sit to Stand: Supervision                Ambulation/Gait Ambulation/Gait assistance: Supervision Gait Distance (Feet): 100 Feet Assistive device: IV Pole Gait Pattern/deviations: Step-through pattern, Decreased stride length Gait velocity: decreased      General Gait Details: cues for upright posture as patient walking with very flexed trunk. Reporting mild dizziness but seems to subside   Marine scientist Rank[place] (Stroke Patients Only)       Balance Overall balance assessment: Mild deficits observed, not formally tested                                          Cognition Arousal/Alertness: Awake/alert Behavior During Therapy: WFL for tasks assessed/performed Overall Cognitive Status: Within Functional Limits for tasks assessed                                          Exercises      General Comments        Pertinent Vitals/Pain Pain Assessment Pain Assessment: Faces Faces Pain Scale: Hurts little more Pain Location: "All over" Pain Descriptors / Indicators: Sore Pain Intervention(s): Monitored during session    Home Living                          Prior Function            PT Goals (current goals can now be found in the care plan section) Acute Rehab PT Goals Patient Stated Goal: to get better PT Goal Formulation: With patient Time For Goal Achievement: 06/02/22 Potential to Achieve Goals: Good Progress towards  PT goals: Progressing toward goals    Frequency    Min 3X/week      PT Plan Current plan remains appropriate    Co-evaluation              AM-PAC PT "6 Clicks" Mobility   Outcome Measure  Help needed turning from your back to your side while in a flat bed without using bedrails?: None Help needed moving from lying on your back to sitting on the side of a flat bed without using bedrails?: None Help needed moving to and from a bed to a chair (including a wheelchair)?: None Help needed standing up from a chair using your arms (e.g., wheelchair or bedside chair)?: None Help needed to walk in hospital room?: A Little Help needed climbing 3-5 steps with a railing? : A Little 6 Click Score: 22    End  of Session   Activity Tolerance: Patient tolerated treatment well Patient left: in bed;with call bell/phone within reach;with bed alarm set Nurse Communication: Mobility status PT Visit Diagnosis: Muscle weakness (generalized) (M62.81);Unsteadiness on feet (R26.81)     Time: 3794-3276 PT Time Calculation (min) (ACUTE ONLY): 30 min  Charges:  $Therapeutic Activity: 23-37 mins                     Danile Trier A. Gilford Rile PT, DPT Acute Rehabilitation Services Office [phone]    [name] 05/20/2022, 5:12 PM

## 2022-05-21 DIAGNOSIS — F13930 Sedative, hypnotic or anxiolytic use, unspecified with withdrawal, uncomplicated: Secondary | ICD-10-CM | POA: Diagnosis not present

## 2022-05-21 DIAGNOSIS — R55 Syncope and collapse: Secondary | ICD-10-CM | POA: Diagnosis not present

## 2022-05-21 LAB — CBC
HCT: 39 % (ref 36.0–46.0)
Hemoglobin: 12.7 g/dL (ref 12.0–15.0)
MCH: 31.5 pg (ref 26.0–34.0)
MCHC: 32.6 g/dL (ref 30.0–36.0)
MCV: 96.8 fL (ref 80.0–100.0)
Platelets: 267 10*3/uL (ref 150–400)
RBC: 4.03 MIL/uL (ref 3.87–5.11)
RDW: 13.1 % (ref 11.5–15.5)
WBC: 6.4 10*3/uL (ref 4.0–10.5)
nRBC: 0 % (ref 0.0–0.2)

## 2022-05-21 LAB — BASIC METABOLIC PANEL
Anion gap: 7 (ref 5–15)
BUN: 12 mg/dL (ref 6–20)
CO2: 26 mmol/L (ref 22–32)
Calcium: 8.8 mg/dL — ABNORMAL LOW (ref 8.9–10.3)
Chloride: 108 mmol/L (ref 98–111)
Creatinine, Ser: 0.7 mg/dL (ref 0.44–1.00)
GFR, Estimated: >60 mL/min (ref >60)
Glucose, Bld: 111 mg/dL — ABNORMAL HIGH (ref 70–99)
Potassium: 3.8 mmol/L (ref 3.5–5.1)
Sodium: 141 mmol/L (ref 135–145)

## 2022-05-21 LAB — MAGNESIUM: Magnesium: 1.9 mg/dL (ref 1.7–2.4)

## 2022-05-21 MED ORDER — CLONAZEPAM 0.5 MG PO TABS: 0.5000 mg | ORAL_TABLET | Freq: Three times a day (TID) | ORAL | 0 refills | Status: DC

## 2022-05-21 MED ORDER — CYCLOBENZAPRINE HCL 5 MG PO TABS: 5.0000 mg | ORAL_TABLET | Freq: Three times a day (TID) | ORAL | 0 refills | Status: AC

## 2022-05-21 NOTE — Discharge Summary (Signed)
Physician Discharge Summary  [name]:[id number] DOB: [birth year] DOA: 05/16/2022  PCP: Sandi Mariscal, MD  Admit date: 05/16/2022 Discharge date: 05/22/2022  Admitted From: Home Disposition:  Home  Recommendations for Outpatient Follow-up:  Follow up with PCP in 1-2 weeks Please obtain BMP/CBC in one week your next doctors visit.  Psychiatry meds adjusted as listed below  Discharge Condition: Stable CODE STATUS: Full code Diet recommendation: Regular  Brief/Interim Summary: 51 year old with history of bipolar, GAD, PTSD, THC use, gastritis, opioid dependence came to the ED with weakness and unwitnessed syncopal event.  Initial concerns of seizure-like activity but later thought to be also from underlying psychiatry issues therefore psychiatry team consulted.  Her medications were adjusted and cleared for discharge.  During hospitalization she also had postural dizziness requiring IV fluids. Patient did well with physical therapy.  Today she is medically stable for discharge.   Assessment & Plan:  Principal Problem:   unwitnessed syncope Active Problems:   Benzodiazepine withdrawal (HCC)   Opiate dependence (HCC)   Severe bipolar I disorder, current or most recent episode depressed (HCC)   IBS (irritable bowel syndrome)   Gastritis and gastroduodenitis   PTSD (post-traumatic stress disorder)   Cannabis abuse/tobacco abuse    Chronic acquired lymphedema   unwitnessed syncope Orthostasis - Significantly improved.  CT of the head is negative.  Concerns of withdrawal, low suspicion for seizure activity.  Supportive care.  Recently had poor oral intake.  UDS positive for THC.  Alcohol, salicylate negative.  TSH normal. - Echocardiogram-EF 60%   Hypokalemia -Replete as needed   Benzodiazepine vs opioid withdrawal (HCC) -Suspicion for withdrawal as patient had gastroenteritis therefore stopped taking medication leading to worsening of depression and stopping her medications.    Severe bipolar I disorder PTSD - Seen by psychiatry team, she is now cleared.  Currently on Seroquel 25 mg twice daily and 30 mg at bedtime.  Prazosin 2 mg at bedtime.  Klonopin 0.5 mg 3 times daily, mirtazapine 15 mg at bedtime and gabapentin 600 mg 3 times daily.   Gastritis and gastroduodenitis -On Protonix daily and Carafate   Cannabis abuse/tobacco abuse  -Nicotine patch as needed   Chronic acquired lymphedema -Chronic.  Compression stocking if possible, elevate legs whenever possible   Musculoskeletal chest pain -Atypical.  Troponins remain negative.      Body mass index is 36.13 kg/m.       Discharge Diagnoses:  Principal Problem:   unwitnessed syncope Active Problems:   Benzodiazepine withdrawal (HCC)   Opiate dependence (HCC)   Severe bipolar I disorder, current or most recent episode depressed (HCC)   IBS (irritable bowel syndrome)   Gastritis and gastroduodenitis   PTSD (post-traumatic stress disorder)   Cannabis abuse/tobacco abuse    Chronic acquired lymphedema      Consultations: Psychiatry  Subjective: Feeling better no complaints.  Discharge Exam: Vitals:   05/21/22 1938 05/22/22 0422  BP: 125/82 109/69  Pulse: 94 75  Resp: 15 18  Temp: 98 F (36.7 C) 97.8 F (36.6 C)  SpO2: 98% 99%   Vitals:   05/21/22 0500 05/21/22 0842 05/21/22 1938 05/22/22 0422  BP: 121/84 124/79 125/82 109/69  Pulse: 84 85 94 75  Resp: '17 18 15 18  '$ Temp: (!) 97.5 F (36.4 C) 97.6 F (36.4 C) 98 F (36.7 C) 97.8 F (36.6 C)  TempSrc: Oral Oral Oral   SpO2:  100% 98% 99%  Height:        General: Pt is alert,  awake, not in acute distress Cardiovascular: RRR, S1/S2 +, no rubs, no gallops Respiratory: CTA bilaterally, no wheezing, no rhonchi Abdominal: Soft, NT, ND, bowel sounds + Extremities: no edema, no cyanosis  Discharge Instructions   Allergies as of 05/22/2022       Reactions   Codeine Nausea And Vomiting   "Pt can take Vicodin if given  with promethazine" "Pt can take Vicodin if given with promethazine"        Medication List     TAKE these medications    acetaminophen 325 MG tablet Commonly known as: TYLENOL Take 650 mg by mouth every 6 (six) hours as needed for mild pain or headache.   clonazePAM 0.5 MG tablet Commonly known as: KLONOPIN Take 1 tablet (0.5 mg total) by mouth 3 (three) times daily. What changed:  medication strength how much to take when to take this   cyclobenzaprine 5 MG tablet Commonly known as: FLEXERIL Take 1 tablet (5 mg total) by mouth 3 (three) times daily for 15 days. What changed:  medication strength how much to take   gabapentin 600 MG tablet Commonly known as: NEURONTIN Take 600 mg by mouth 3 (three) times daily.   hydrOXYzine 50 MG capsule Commonly known as: VISTARIL Take 50 mg by mouth daily as needed for anxiety.   ibuprofen 800 MG tablet Commonly known as: ADVIL Take 800 mg by mouth 4 (four) times daily as needed for headache or mild pain.   Medical Compression Stockings Misc 1 pair of compression stocking for patient use.   mirtazapine 15 MG tablet Commonly known as: REMERON Take 1 tablet (15 mg total) by mouth at bedtime.   nicotine 21 mg/24hr patch Commonly known as: NICODERM CQ - dosed in mg/24 hours Place 1 patch (21 mg total) onto the skin daily.   oxyCODONE-acetaminophen 10-325 MG tablet Commonly known as: PERCOCET Take 1 tablet by mouth every 4 (four) hours as needed for pain.   pantoprazole 40 MG tablet Commonly known as: PROTONIX Take 40 mg by mouth daily.   prazosin 2 MG capsule Commonly known as: MINIPRESS Take 1 capsule (2 mg total) by mouth at bedtime.   promethazine 12.5 MG tablet Commonly known as: PHENERGAN Take 1 tablet (12.5 mg total) by mouth every 6 (six) hours as needed for nausea or vomiting (not relieved by zofran).   QUEtiapine 25 MG tablet Commonly known as: SEROQUEL Take 1 tablet (25 mg total) by mouth 2 (two) times  daily.   QUEtiapine 300 MG tablet Commonly known as: SEROQUEL Take 1 tablet (300 mg total) by mouth at bedtime.   sucralfate 1 g tablet Commonly known as: CARAFATE Take 1 g by mouth in the morning, at noon, and at bedtime.        Allergies  Allergen Reactions   Codeine Nausea And Vomiting    "Pt can take Vicodin if given with promethazine" "Pt can take Vicodin if given with promethazine"    You were cared for by a hospitalist during your hospital stay. If you have any questions about your discharge medications or the care you received while you were in the hospital after you are discharged, you can call the unit and asked to speak with the hospitalist on call if the hospitalist that took care of you is not available. Once you are discharged, your primary care physician will handle any further medical issues. Please note that no refills for any discharge medications will be authorized once you are discharged, as it is imperative  that you return to your primary care physician (or establish a relationship with a primary care physician if you do not have one) for your aftercare needs so that they can reassess your need for medications and monitor your lab values.   Procedures/Studies: ECHOCARDIOGRAM COMPLETE  Result Date: 05/18/2022    ECHOCARDIOGRAM REPORT   Patient Name:   [name] Date of Exam: 05/18/2022 Medical Rec #:  [id number]        Height:       60.0 in Accession #:    [id number]       Weight:       185.0 lb Date of Birth:  [birth year]       BSA:          1.806 m Patient Age:    80 years         BP:           122/67 mmHg Patient Gender: F                HR:           90 bpm. Exam Location:  Inpatient Procedure: 2D Echo, Cardiac Doppler and Color Doppler Indications:    Syncope  History:        Patient has no prior history of Echocardiogram examinations.  Sonographer:    Memory Argue Referring Phys: [id number] [place]  1. Left ventricular ejection fraction, by  estimation, is 60 to 65%. The left ventricle has normal function. The left ventricle has no regional wall motion abnormalities. Left ventricular diastolic parameters were normal.  2. Right ventricular systolic function is normal. The right ventricular size is normal.  3. The mitral valve is grossly normal. No evidence of mitral valve regurgitation. No evidence of mitral stenosis.  4. The aortic valve is grossly normal. Aortic valve regurgitation is not visualized. No aortic stenosis is present.  5. The inferior vena cava is normal in size with greater than 50% respiratory variability, suggesting right atrial pressure of 3 mmHg. Comparison(s): No prior Echocardiogram. Conclusion(s)/Recommendation(s): Normal biventricular function without evidence of hemodynamically significant valvular heart disease. FINDINGS  Left Ventricle: Left ventricular ejection fraction, by estimation, is 60 to 65%. The left ventricle has normal function. The left ventricle has no regional wall motion abnormalities. The left ventricular internal cavity size was normal in size. There is  no left ventricular hypertrophy. Left ventricular diastolic parameters were normal. Right Ventricle: The right ventricular size is normal. Right vetricular wall thickness was not well visualized. Right ventricular systolic function is normal. Left Atrium: Left atrial size was normal in size. Right Atrium: Right atrial size was normal in size. Pericardium: Trivial pericardial effusion is present. Mitral Valve: The mitral valve is grossly normal. No evidence of mitral valve regurgitation. No evidence of mitral valve stenosis. Tricuspid Valve: The tricuspid valve is grossly normal. Tricuspid valve regurgitation is not demonstrated. No evidence of tricuspid stenosis. Aortic Valve: The aortic valve is grossly normal. Aortic valve regurgitation is not visualized. No aortic stenosis is present. Aortic valve mean gradient measures 3.0 mmHg. Aortic valve peak gradient  measures 6.4 mmHg. Aortic valve area, by VTI measures 1.80 cm. Pulmonic Valve: The pulmonic valve was not well visualized. Pulmonic valve regurgitation is not visualized. No evidence of pulmonic stenosis. Aorta: The aortic root, ascending aorta and aortic arch are all structurally normal, with no evidence of dilitation or obstruction. Venous: The inferior vena cava is normal in size with greater than 50% respiratory variability, suggesting right  atrial pressure of 3 mmHg. IAS/Shunts: The interatrial septum appears to be lipomatous. The interatrial septum was not well visualized.  LEFT VENTRICLE PLAX 2D LVIDd:         4.10 cm   Diastology LVIDs:         2.90 cm   LV e' medial:    7.07 cm/s LV PW:         1.00 cm   LV E/e' medial:  10.5 LV IVS:        1.00 cm   LV e' lateral:   7.72 cm/s LVOT diam:     2.00 cm   LV E/e' lateral: 9.6 LV SV:         37 LV SV Index:   21 LVOT Area:     3.14 cm  RIGHT VENTRICLE TAPSE (M-mode): 1.8 cm LEFT ATRIUM             Index        RIGHT ATRIUM           Index LA diam:        2.40 cm 1.33 cm/m   RA Area:     11.00 cm LA Vol (A2C):   37.3 ml 20.66 ml/m  RA Volume:   23.80 ml  13.18 ml/m LA Vol (A4C):   35.4 ml 19.60 ml/m LA Biplane Vol: 37.7 ml 20.88 ml/m  AORTIC VALVE AV Area (Vmax):    1.90 cm AV Area (Vmean):   1.79 cm AV Area (VTI):     1.80 cm AV Vmax:           126.00 cm/s AV Vmean:          85.500 cm/s AV VTI:            0.206 m AV Peak Grad:      6.4 mmHg AV Mean Grad:      3.0 mmHg LVOT Vmax:         76.40 cm/s LVOT Vmean:        48.700 cm/s LVOT VTI:          0.118 m LVOT/AV VTI ratio: 0.57  AORTA Ao Root diam: 3.20 cm Ao Asc diam:  2.40 cm MITRAL VALVE MV Area (PHT): 4.21 cm    SHUNTS MV Decel Time: 180 msec    Systemic VTI:  0.12 m MV E velocity: 74.40 cm/s  Systemic Diam: 2.00 cm MV A velocity: 87.90 cm/s MV E/A ratio:  0.85 Buford Dresser MD Electronically signed by Buford Dresser MD Signature Date/Time: 05/18/2022/3:36:18 PM    Final    DG  Chest 1 View  Result Date: 05/17/2022 CLINICAL DATA:  Shortness of breath EXAM: CHEST  1 VIEW COMPARISON:  06/12/2021 FINDINGS: Cardiac and mediastinal contours are within normal limits given AP technique. No focal pulmonary opacity. No pleural effusion or pneumothorax. No acute osseous abnormality. IMPRESSION: No acute cardiopulmonary process. Electronically Signed   By: Merilyn Baba M.D.   On: 05/17/2022 11:00   CT HEAD WO CONTRAST (5MM)  Result Date: 05/16/2022 CLINICAL DATA:  Syncope/presyncope, cerebrovascular cause suspected EXAM: CT HEAD WITHOUT CONTRAST TECHNIQUE: Contiguous axial images were obtained from the base of the skull through the vertex without intravenous contrast. RADIATION DOSE REDUCTION: This exam was performed according to the departmental dose-optimization program which includes automated exposure control, adjustment of the mA and/or kV according to patient size and/or use of iterative reconstruction technique. COMPARISON:  None Available. FINDINGS: Brain: Normal anatomic configuration. No abnormal intra or extra-axial  mass lesion or fluid collection. No abnormal mass effect or midline shift. No evidence of acute intracranial hemorrhage or infarct. Ventricular size is normal. Cerebellum unremarkable. Vascular: Unremarkable Skull: Intact Sinuses/Orbits: Paranasal sinuses are clear. Orbits are unremarkable. Other: Mastoid air cells and middle ear cavities are clear. IMPRESSION: No acute intracranial abnormality. Electronically Signed   By: Fidela Salisbury M.D.   On: 05/16/2022 22:43   MM 3D SCREEN BREAST BILATERAL  Result Date: 04/26/2022 CLINICAL DATA:  Screening. EXAM: DIGITAL SCREENING BILATERAL MAMMOGRAM WITH TOMOSYNTHESIS AND CAD TECHNIQUE: Bilateral screening digital craniocaudal and mediolateral oblique mammograms were obtained. Bilateral screening digital breast tomosynthesis was performed. The images were evaluated with computer-aided detection. COMPARISON:  Previous exam(s).  ACR Breast Density Category b: There are scattered areas of fibroglandular density. FINDINGS: There are no findings suspicious for malignancy. IMPRESSION: No mammographic evidence of malignancy. A result letter of this screening mammogram will be mailed directly to the patient. RECOMMENDATION: Screening mammogram in one year. (Code:SM-B-01Y) BI-RADS CATEGORY  1: Negative. Electronically Signed   By: Ammie Ferrier M.D.   On: 04/26/2022 08:46     The results of significant diagnostics from this hospitalization (including imaging, microbiology, ancillary and laboratory) are listed below for reference.     Microbiology: No results found for this or any previous visit (from the past 240 hour(s)).   Labs: BNP (last 3 results) Recent Labs    06/12/21 0327 05/16/22 2109  BNP 26.7 96.2   Basic Metabolic Panel: Recent Labs  Lab 05/17/22 1847 05/18/22 0626 05/19/22 0124 05/20/22 0051 05/21/22 0033 05/22/22 0134  NA  --  141 141 142 141 142  K  --  3.0* 3.9 3.3* 3.8 4.2  CL  --  112* 112* 113* 108 104  CO2  --  '23 25 24 26 28  '$ GLUCOSE  --  114* 103* 98 111* 104*  BUN  --  '12 10 12 12 11  '$ CREATININE  --  0.89 0.82 0.97 0.70 0.86  CALCIUM  --  8.9 8.9 8.2* 8.8* 9.5  MG 2.0  --   --  1.8 1.9 2.0   Liver Function Tests: Recent Labs  Lab 05/16/22 2109  AST 15  ALT 14  ALKPHOS 60  BILITOT 0.6  PROT 7.9  ALBUMIN 4.2   No results for input(s): "LIPASE", "AMYLASE" in the last 168 hours. No results for input(s): "AMMONIA" in the last 168 hours. CBC: Recent Labs  Lab 05/16/22 2109 05/18/22 0109 05/19/22 0124 05/20/22 0051 05/21/22 0033 05/22/22 0134  WBC 12.9* 9.7 8.0 6.6 6.4 7.9  NEUTROABS 9.7*  --  4.4  --   --   --   HGB 16.1* 14.7 14.0 12.1 12.7 13.3  HCT 47.8* 44.3 42.8 36.6 39.0 42.0  MCV 94.7 94.1 96.2 95.1 96.8 98.6  PLT 366 346 276 257 267 287   Cardiac Enzymes: Recent Labs  Lab 05/16/22 2109  CKTOTAL 69   BNP: Invalid input(s): "POCBNP" CBG: No results  for input(s): "GLUCAP" in the last 168 hours. D-Dimer No results for input(s): "DDIMER" in the last 72 hours. Hgb A1c No results for input(s): "HGBA1C" in the last 72 hours. Lipid Profile No results for input(s): "CHOL", "HDL", "LDLCALC", "TRIG", "CHOLHDL", "LDLDIRECT" in the last 72 hours. Thyroid function studies No results for input(s): "TSH", "T4TOTAL", "T3FREE", "THYROIDAB" in the last 72 hours.  Invalid input(s): "FREET3" Anemia work up No results for input(s): "VITAMINB12", "FOLATE", "FERRITIN", "TIBC", "IRON", "RETICCTPCT" in the last 72 hours. Urinalysis  Component Value Date/Time   COLORURINE YELLOW 05/16/2022 2039   APPEARANCEUR HAZY (A) 05/16/2022 2039   LABSPEC 1.020 05/16/2022 2039   PHURINE 5.0 05/16/2022 2039   GLUCOSEU NEGATIVE 05/16/2022 2039   HGBUR NEGATIVE 05/16/2022 2039   BILIRUBINUR NEGATIVE 05/16/2022 2039   KETONESUR 20 (A) 05/16/2022 2039   PROTEINUR 30 (A) 05/16/2022 2039   UROBILINOGEN 0.2 04/08/2020 2149   NITRITE NEGATIVE 05/16/2022 2039   LEUKOCYTESUR NEGATIVE 05/16/2022 2039   Sepsis Labs Recent Labs  Lab 05/19/22 0124 05/20/22 0051 05/21/22 0033 05/22/22 0134  WBC 8.0 6.6 6.4 7.9   Microbiology No results found for this or any previous visit (from the past 240 hour(s)).   Time coordinating discharge:  I have spent 35 minutes face to face with the patient and on the ward discussing the patients care, assessment, plan and disposition with other care givers. >50% of the time was devoted counseling the patient about the risks and benefits of treatment/Discharge disposition and coordinating care.   SIGNED:   Damita Lack, MD  [facility] 05/22/2022, 11:52 AM   If 7PM-7AM, please contact night-coverage

## 2022-05-21 NOTE — Progress Notes (Signed)
Mobility Specialist Progress Note   05/21/22 1321  Mobility  Activity Ambulated with assistance in hallway  Level of Assistance Contact guard assist, steadying assist  Assistive Device Front wheel walker  Distance Ambulated (ft) 94 ft  Activity Response Tolerated fair  $Mobility charge 1 Mobility   Pre Mobility: 101 HR During Mobility: 119 HR Post Mobility: 112 HR  Received in bed c/o LE pain, more so in L leg but agreeable to mobility. Pt requested to use BR prior to ambulation, no faults during transfer but pt overtly anxious throughout. Successful void. Pt having antalgic like gait during ambulation d/t "shooting pain" in LLE. X3 standing breaks where pt wanting to lean over on RW d/t fatigue, mod cues used during ambulation to correct posture. Returned back to room w/o fault and left in bed w/ all needs met and bed alarm on.   Pt stating they still feel uncomfortable going home in the state they are in, notified RN.   Holland Falling Mobility Specialist MS [facility] #:  [phone] Acute Rehab Office:  [phone]

## 2022-05-21 NOTE — TOC Transition Note (Signed)
Transition of Care [facility]) - CM/SW Discharge Note   Patient Details  Name: [name] MRN: [id number] Date of Birth: Jan 11, 1971  [facility]) CM/SW Contact:  Bartholomew Crews, RN Phone Number: [phone] 05/21/2022, 3:40 PM   Clinical Narrative:     Patient to transition home today. No TOC needs identified at this time.   Final next level of care: Home/Self Care Barriers to Discharge: No Barriers Identified   Patient Goals and CMS Choice     Choice offered to / list presented to : NA  Discharge Placement                       Discharge Plan and Services   Discharge Planning Services: CM Consult            DME Arranged: N/A DME Agency: NA         HH Agency: NA        Social Determinants of Health (SDOH) Interventions Housing Interventions: Other (Comment)   Readmission Risk Interventions     No data to display

## 2022-05-22 LAB — BASIC METABOLIC PANEL
Anion gap: 10 (ref 5–15)
BUN: 11 mg/dL (ref 6–20)
CO2: 28 mmol/L (ref 22–32)
Calcium: 9.5 mg/dL (ref 8.9–10.3)
Chloride: 104 mmol/L (ref 98–111)
Creatinine, Ser: 0.86 mg/dL (ref 0.44–1.00)
GFR, Estimated: >60 mL/min (ref >60)
Glucose, Bld: 104 mg/dL — ABNORMAL HIGH (ref 70–99)
Potassium: 4.2 mmol/L (ref 3.5–5.1)
Sodium: 142 mmol/L (ref 135–145)

## 2022-05-22 LAB — MAGNESIUM: Magnesium: 2 mg/dL (ref 1.7–2.4)

## 2022-05-22 LAB — CBC
HCT: 42 % (ref 36.0–46.0)
Hemoglobin: 13.3 g/dL (ref 12.0–15.0)
MCH: 31.2 pg (ref 26.0–34.0)
MCHC: 31.7 g/dL (ref 30.0–36.0)
MCV: 98.6 fL (ref 80.0–100.0)
Platelets: 287 10*3/uL (ref 150–400)
RBC: 4.26 MIL/uL (ref 3.87–5.11)
RDW: 13.2 % (ref 11.5–15.5)
WBC: 7.9 10*3/uL (ref 4.0–10.5)
nRBC: 0.3 % — ABNORMAL HIGH (ref 0.0–0.2)

## 2022-05-22 MED ORDER — PROMETHAZINE HCL 12.5 MG PO TABS: 12.5000 mg | ORAL_TABLET | Freq: Four times a day (QID) | ORAL | 0 refills | Status: AC | PRN

## 2022-05-22 MED ORDER — QUETIAPINE FUMARATE 300 MG PO TABS: 300.0000 mg | ORAL_TABLET | Freq: Every day | ORAL | 0 refills | Status: DC

## 2022-05-22 MED ORDER — QUETIAPINE FUMARATE 25 MG PO TABS: 25.0000 mg | ORAL_TABLET | Freq: Two times a day (BID) | ORAL | 0 refills | Status: DC

## 2022-05-22 MED ORDER — PRAZOSIN HCL 2 MG PO CAPS: 2.0000 mg | ORAL_CAPSULE | Freq: Every day | ORAL | 0 refills | Status: DC

## 2022-05-22 NOTE — Progress Notes (Signed)
Seen and examined at bedside, no complaints. Her friend is at bedside this morning.  She was medically discharged yesterday on 9/23.  Discharge summary has been updated.  Vital signs remained stable.  Gerlean Ren MD [facility]

## 2022-05-22 NOTE — TOC Transition Note (Signed)
Transition of Care [facility]) - CM/SW Discharge Note   Patient Details  Name: [name] MRN: [id number] Date of Birth: 08-29-1971  Transition of Care [facility]) CM/SW Contact:  Bartholomew Crews, RN Phone Number: [phone] 05/22/2022, 9:47 AM   Clinical Narrative:     Spoke with patient on hospital room phone to discuss post acute transition. Patient stated that she was very weak, nauseous, and her family was out of town yesterday. Her best friend is currently at bedside and her family is ready and supportive for her to return home. While she continues to feel nauseous and weak, she feels that she is better prepared for her transition home today. No TOC needs identified at this time.   Final next level of care: Home/Self Care Barriers to Discharge: No Barriers Identified   Patient Goals and CMS Choice     Choice offered to / list presented to : NA  Discharge Placement                       Discharge Plan and Services   Discharge Planning Services: CM Consult            DME Arranged: N/A DME Agency: NA         HH Agency: NA        Social Determinants of Health (SDOH) Interventions Housing Interventions: Other (Comment)   Readmission Risk Interventions     No data to display

## 2022-05-22 NOTE — Plan of Care (Signed)
  Problem: Education: Goal: Knowledge of General Education information will improve Description: Including pain rating scale, medication(s)/side effects and non-pharmacologic comfort measures Outcome: Not Progressing   Problem: Health Behavior/Discharge Planning: Goal: Ability to manage health-related needs will improve Outcome: Not Progressing   Problem: Clinical Measurements: Goal: Ability to maintain clinical measurements within normal limits will improve Outcome: Not Progressing Goal: Will remain free from infection Outcome: Not Progressing Goal: Diagnostic test results will improve Outcome: Not Progressing Goal: Respiratory complications will improve Outcome: Not Progressing Goal: Cardiovascular complication will be avoided Outcome: Not Progressing   Problem: Nutrition: Goal: Adequate nutrition will be maintained Outcome: Not Progressing   Problem: Coping: Goal: Level of anxiety will decrease Outcome: Not Progressing   Problem: Elimination: Goal: Will not experience complications related to bowel motility Outcome: Not Progressing Goal: Will not experience complications related to urinary retention Outcome: Not Progressing   

## 2022-05-22 NOTE — Progress Notes (Signed)
Nsg Discharge Note  Admit Date:  05/16/2022 Discharge date: 05/22/2022   [name] to be D/C'd Home per MD order.  AVS completed. Patient/caregiver able to verbalize understanding.  Discharge Medication: Allergies as of 05/22/2022       Reactions   Codeine Nausea And Vomiting   "Pt can take Vicodin if given with promethazine" "Pt can take Vicodin if given with promethazine"        Medication List     TAKE these medications    acetaminophen 325 MG tablet Commonly known as: TYLENOL Take 650 mg by mouth every 6 (six) hours as needed for mild pain or headache.   clonazePAM 0.5 MG tablet Commonly known as: KLONOPIN Take 1 tablet (0.5 mg total) by mouth 3 (three) times daily. What changed:  medication strength how much to take when to take this   cyclobenzaprine 5 MG tablet Commonly known as: FLEXERIL Take 1 tablet (5 mg total) by mouth 3 (three) times daily for 15 days. What changed:  medication strength how much to take   gabapentin 600 MG tablet Commonly known as: NEURONTIN Take 600 mg by mouth 3 (three) times daily.   hydrOXYzine 50 MG capsule Commonly known as: VISTARIL Take 50 mg by mouth daily as needed for anxiety.   ibuprofen 800 MG tablet Commonly known as: ADVIL Take 800 mg by mouth 4 (four) times daily as needed for headache or mild pain.   Medical Compression Stockings Misc 1 pair of compression stocking for patient use.   mirtazapine 15 MG tablet Commonly known as: REMERON Take 1 tablet (15 mg total) by mouth at bedtime.   nicotine 21 mg/24hr patch Commonly known as: NICODERM CQ - dosed in mg/24 hours Place 1 patch (21 mg total) onto the skin daily.   oxyCODONE-acetaminophen 10-325 MG tablet Commonly known as: PERCOCET Take 1 tablet by mouth every 4 (four) hours as needed for pain.   pantoprazole 40 MG tablet Commonly known as: PROTONIX Take 40 mg by mouth daily.   prazosin 2 MG capsule Commonly known as: MINIPRESS Take 1 capsule  (2 mg total) by mouth at bedtime.   promethazine 12.5 MG tablet Commonly known as: PHENERGAN Take 1 tablet (12.5 mg total) by mouth every 6 (six) hours as needed for nausea or vomiting (not relieved by zofran).   QUEtiapine 25 MG tablet Commonly known as: SEROQUEL Take 1 tablet (25 mg total) by mouth 2 (two) times daily.   QUEtiapine 300 MG tablet Commonly known as: SEROQUEL Take 1 tablet (300 mg total) by mouth at bedtime.   sucralfate 1 g tablet Commonly known as: CARAFATE Take 1 g by mouth in the morning, at noon, and at bedtime.        Discharge Assessment: Vitals:   05/21/22 1938 05/22/22 0422  BP: 125/82 109/69  Pulse: 94 75  Resp: 15 18  Temp: 98 F (36.7 C) 97.8 F (36.6 C)  SpO2: 98% 99%   Skin clean, dry and intact without evidence of skin break down, no evidence of skin tears noted. IV catheter discontinued intact. Site without signs and symptoms of complications - no redness or edema noted at insertion site, patient denies c/o pain - only slight tenderness at site.  Dressing with slight pressure applied.  D/c Instructions-Education: Discharge instructions given to patient/family with verbalized understanding. D/c education completed with patient/family including follow up instructions, medication list, d/c activities limitations if indicated, with other d/c instructions as indicated by MD - patient able to verbalize  understanding, all questions fully answered. Patient instructed to return to ED, call 911, or call MD for any changes in condition.  Patient escorted via [place], and D/C home via private auto.  Atilano Ina, RN 05/22/2022 12:02 PM

## 2022-06-10 ENCOUNTER — Emergency Department (HOSPITAL_BASED_OUTPATIENT_CLINIC_OR_DEPARTMENT_OTHER)
Admission: EM | Admit: 2022-06-10 | Discharge: 2022-06-10 | Disposition: A | Discharge status: Home / Self Care | Payer: Medicare Other | Source: Home / Self Care | Attending: Emergency Medicine | Admitting: Emergency Medicine

## 2022-06-10 ENCOUNTER — Emergency Department (HOSPITAL_COMMUNITY)
Admission: EM | Admit: 2022-06-10 | Discharge: 2022-06-10 | Disposition: A | Discharge status: Home / Self Care | Payer: Medicare Other | Attending: Emergency Medicine | Admitting: Emergency Medicine

## 2022-06-10 ENCOUNTER — Other Ambulatory Visit: Payer: Self-pay

## 2022-06-10 ENCOUNTER — Encounter (HOSPITAL_COMMUNITY): Payer: Self-pay | Admitting: Emergency Medicine

## 2022-06-10 ENCOUNTER — Encounter (HOSPITAL_BASED_OUTPATIENT_CLINIC_OR_DEPARTMENT_OTHER): Payer: Self-pay

## 2022-06-10 DIAGNOSIS — Z85828 Personal history of other malignant neoplasm of skin: Secondary | ICD-10-CM | POA: Insufficient documentation

## 2022-06-10 DIAGNOSIS — I890 Lymphedema, not elsewhere classified: Secondary | ICD-10-CM | POA: Insufficient documentation

## 2022-06-10 DIAGNOSIS — L03115 Cellulitis of right lower limb: Secondary | ICD-10-CM | POA: Diagnosis not present

## 2022-06-10 DIAGNOSIS — M7989 Other specified soft tissue disorders: Secondary | ICD-10-CM | POA: Diagnosis present

## 2022-06-10 DIAGNOSIS — Z5321 Procedure and treatment not carried out due to patient leaving prior to being seen by health care provider: Secondary | ICD-10-CM

## 2022-06-10 DIAGNOSIS — R109 Unspecified abdominal pain: Secondary | ICD-10-CM | POA: Insufficient documentation

## 2022-06-10 DIAGNOSIS — L03119 Cellulitis of unspecified part of limb: Secondary | ICD-10-CM

## 2022-06-10 DIAGNOSIS — L03116 Cellulitis of left lower limb: Secondary | ICD-10-CM | POA: Insufficient documentation

## 2022-06-10 LAB — URINALYSIS, ROUTINE W REFLEX MICROSCOPIC
Bilirubin Urine: NEGATIVE
Glucose, UA: NEGATIVE mg/dL
Hgb urine dipstick: NEGATIVE
Ketones, ur: NEGATIVE mg/dL
Leukocytes,Ua: NEGATIVE
Nitrite: NEGATIVE
Protein, ur: NEGATIVE mg/dL
Specific Gravity, Urine: 1.002 — ABNORMAL LOW (ref 1.005–1.030)
pH: 7 (ref 5.0–8.0)

## 2022-06-10 LAB — LACTIC ACID, PLASMA: Lactic Acid, Venous: 1.1 mmol/L (ref 0.5–1.9)

## 2022-06-10 LAB — COMPREHENSIVE METABOLIC PANEL
ALT: 19 U/L (ref 0–44)
ALT: 13 U/L (ref 0–44)
AST: 18 U/L (ref 15–41)
AST: 12 U/L — ABNORMAL LOW (ref 15–41)
Albumin: 3.5 g/dL (ref 3.5–5.0)
Albumin: 3.9 g/dL (ref 3.5–5.0)
Alkaline Phosphatase: 71 U/L (ref 38–126)
Alkaline Phosphatase: 72 U/L (ref 38–126)
Anion gap: 7 (ref 5–15)
Anion gap: 7 (ref 5–15)
BUN: 7 mg/dL (ref 6–20)
BUN: 9 mg/dL (ref 6–20)
CO2: 31 mmol/L (ref 22–32)
CO2: 28 mmol/L (ref 22–32)
Calcium: 9.2 mg/dL (ref 8.9–10.3)
Calcium: 9.1 mg/dL (ref 8.9–10.3)
Chloride: 104 mmol/L (ref 98–111)
Chloride: 107 mmol/L (ref 98–111)
Creatinine, Ser: 0.98 mg/dL (ref 0.44–1.00)
Creatinine, Ser: 0.95 mg/dL (ref 0.44–1.00)
GFR, Estimated: >60 mL/min (ref >60)
GFR, Estimated: >60 mL/min (ref >60)
Glucose, Bld: 86 mg/dL (ref 70–99)
Glucose, Bld: 95 mg/dL (ref 70–99)
Potassium: 3.3 mmol/L — ABNORMAL LOW (ref 3.5–5.1)
Potassium: 3.6 mmol/L (ref 3.5–5.1)
Sodium: 142 mmol/L (ref 135–145)
Sodium: 142 mmol/L (ref 135–145)
Total Bilirubin: 0.4 mg/dL (ref 0.3–1.2)
Total Bilirubin: 0.2 mg/dL — ABNORMAL LOW (ref 0.3–1.2)
Total Protein: 6.9 g/dL (ref 6.5–8.1)
Total Protein: 7.1 g/dL (ref 6.5–8.1)

## 2022-06-10 LAB — CBC WITH DIFFERENTIAL/PLATELET
Abs Immature Granulocytes: 0.04 10*3/uL (ref 0.00–0.07)
Abs Immature Granulocytes: 0.02 10*3/uL (ref 0.00–0.07)
Basophils Absolute: 0.1 10*3/uL (ref 0.0–0.1)
Basophils Absolute: 0 10*3/uL (ref 0.0–0.1)
Basophils Relative: 1 %
Basophils Relative: 0 %
Eosinophils Absolute: 0.2 10*3/uL (ref 0.0–0.5)
Eosinophils Absolute: 0.2 10*3/uL (ref 0.0–0.5)
Eosinophils Relative: 3 %
Eosinophils Relative: 3 %
HCT: 43.6 % (ref 36.0–46.0)
HCT: 42.1 % (ref 36.0–46.0)
Hemoglobin: 13.7 g/dL (ref 12.0–15.0)
Hemoglobin: 13.4 g/dL (ref 12.0–15.0)
Immature Granulocytes: 1 %
Immature Granulocytes: 0 %
Lymphocytes Relative: 32 %
Lymphocytes Relative: 31 %
Lymphs Abs: 2.7 10*3/uL (ref 0.7–4.0)
Lymphs Abs: 2.4 10*3/uL (ref 0.7–4.0)
MCH: 31 pg (ref 26.0–34.0)
MCH: 31.2 pg (ref 26.0–34.0)
MCHC: 31.4 g/dL (ref 30.0–36.0)
MCHC: 31.8 g/dL (ref 30.0–36.0)
MCV: 98.6 fL (ref 80.0–100.0)
MCV: 97.9 fL (ref 80.0–100.0)
Monocytes Absolute: 0.6 10*3/uL (ref 0.1–1.0)
Monocytes Absolute: 0.5 10*3/uL (ref 0.1–1.0)
Monocytes Relative: 7 %
Monocytes Relative: 6 %
Neutro Abs: 4.7 10*3/uL (ref 1.7–7.7)
Neutro Abs: 4.6 10*3/uL (ref 1.7–7.7)
Neutrophils Relative %: 56 %
Neutrophils Relative %: 60 %
Platelets: 345 10*3/uL (ref 150–400)
Platelets: 331 10*3/uL (ref 150–400)
RBC: 4.42 MIL/uL (ref 3.87–5.11)
RBC: 4.3 MIL/uL (ref 3.87–5.11)
RDW: 13 % (ref 11.5–15.5)
RDW: 13.2 % (ref 11.5–15.5)
WBC: 8.3 10*3/uL (ref 4.0–10.5)
WBC: 7.7 10*3/uL (ref 4.0–10.5)
nRBC: 0 % (ref 0.0–0.2)
nRBC: 0 % (ref 0.0–0.2)

## 2022-06-10 LAB — BRAIN NATRIURETIC PEPTIDE: B Natriuretic Peptide: 17 pg/mL (ref 0.0–100.0)

## 2022-06-10 MED ORDER — OXYCODONE-ACETAMINOPHEN 5-325 MG PO TABS
2.0000 | ORAL_TABLET | Freq: Once | ORAL | Status: AC
  Administered 2022-06-10: 2 via ORAL
  Filled 2022-06-10: qty 2

## 2022-06-10 MED ORDER — CEPHALEXIN 500 MG PO CAPS: 500.0000 mg | ORAL_CAPSULE | Freq: Four times a day (QID) | ORAL | 0 refills | Status: DC

## 2022-06-10 NOTE — ED Triage Notes (Signed)
Pt reports BIL leg redness and swelling since last week.

## 2022-06-10 NOTE — ED Provider Notes (Signed)
[facility] EMERGENCY DEPT Provider Note   CSN: [id number] Arrival date & time: 06/10/22  1711     History  Chief Complaint  Patient presents with   Leg Swelling    [name] is a 51 y.o. female.  HPI Patient presents with pain and swelling both her lower legs.  Particular on left.  History of lymphedema.  Worsening swelling.  Has pain on both sides.  States she bumped into them and became more painful.  Has not been wearing compression dressings.   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   Jaundice    MRSA (methicillin resistant Staphylococcus aureus)    hospitalized for 48 hours in Dec 2016   Staph infection    "in my blood"    Home Medications Prior to Admission medications   Medication Sig Start Date End Date Taking? Authorizing Provider  cephALEXin (KEFLEX) 500 MG capsule Take 1 capsule (500 mg total) by mouth 4 (four) times daily. 06/10/22  Yes Davonna Belling, MD  acetaminophen (TYLENOL) 325 MG tablet Take 650 mg by mouth every 6 (six) hours as needed for mild pain or headache.    [provider]  clonazePAM (KLONOPIN) 0.5 MG tablet Take 1 tablet (0.5 mg total) by mouth 3 (three) times daily. 05/21/22 06/20/22  Damita Lack, MD  Elastic Bandages & Supports (MEDICAL COMPRESSION STOCKINGS) [place] 1 pair of compression stocking for patient use. 06/12/21   Larene Pickett, PA-C  gabapentin  (NEURONTIN) 600 MG tablet Take 600 mg by mouth 3 (three) times daily. 09/29/20   [provider]  hydrOXYzine (VISTARIL) 50 MG capsule Take 50 mg by mouth daily as needed for anxiety. 09/24/20   [provider]  ibuprofen (ADVIL) 800 MG tablet Take 800 mg by mouth 4 (four) times daily as needed for headache or mild pain. 06/05/20   [provider]  mirtazapine (REMERON) 15 MG tablet Take 1 tablet (15 mg total) by mouth at bedtime. 04/05/21   Corky Sox, MD  nicotine (NICODERM CQ - DOSED IN MG/24 HOURS) 21 mg/24hr patch Place 1 patch (21 mg total) onto the skin daily. Patient not taking: Reported on 05/16/2022 04/05/21   Corky Sox, MD  oxyCODONE-acetaminophen (PERCOCET) 10-325 MG tablet Take 1 tablet by mouth every 4 (four) hours as needed for pain.    [provider]  pantoprazole (PROTONIX) 40 MG tablet Take 40 mg by mouth daily. 10/04/20   [provider]  prazosin (MINIPRESS) 2 MG capsule Take 1 capsule (2 mg total) by mouth at bedtime. 05/22/22 06/21/22  Amin, Jeanella Flattery, MD  promethazine (PHENERGAN) 12.5 MG tablet Take 1 tablet (12.5 mg total) by mouth every 6 (six) hours as needed for nausea or vomiting (not relieved by zofran).  05/22/22   Amin, Jeanella Flattery, MD  QUEtiapine (SEROQUEL) 25 MG tablet Take 1 tablet (25 mg total) by mouth 2 (two) times daily. 05/22/22 06/21/22  Amin, Jeanella Flattery, MD  QUEtiapine (SEROQUEL) 300 MG tablet Take 1 tablet (300 mg total) by mouth at bedtime. 05/22/22 06/21/22  Amin, Jeanella Flattery, MD  sucralfate (CARAFATE) 1 g tablet Take 1 g by mouth in the morning, at noon, and at bedtime. 03/03/22   [provider]      Allergies    Codeine    Review of Systems   Review of Systems  Physical Exam Updated Vital Signs BP 122/78 (BP Location: Right Arm)   Pulse 88   Temp 98.3 F (36.8 C) (Oral)   Resp 18   Ht 5' (1.524 m)   Wt 74.8 kg   LMP 06/14/2012   SpO2 99%   BMI 32.22 kg/m  Physical Exam Vitals and  nursing note reviewed.  Cardiovascular:     Rate and Rhythm: Regular rhythm.  Pulmonary:     Breath sounds: No wheezing.  Abdominal:     Tenderness: There is abdominal tenderness.  Musculoskeletal:     Cervical back: Neck supple.     Comments: Lymphedema bilateral extremities.  Worse on the left but also has worsening erythema.  Some warmth.  Mild serous drainage.  Posteriorly there is more of a skin irritation.  Skin:    General: Skin is warm.     Capillary Refill: Capillary refill takes less than 2 seconds.  Neurological:     Mental Status: She is alert and oriented to person, place, and time.     ED Results / Procedures / Treatments   Labs (all labs ordered are listed, but only abnormal results are displayed) Labs Reviewed  COMPREHENSIVE METABOLIC PANEL - Abnormal; Notable for the following components:      Result Value   AST 12 (*)    Total Bilirubin 0.2 (*)    All other components within normal limits  BRAIN NATRIURETIC PEPTIDE  CBC WITH DIFFERENTIAL/PLATELET    EKG None  Radiology No results found.  Procedures Procedures    Medications Ordered in ED Medications  oxyCODONE-acetaminophen (PERCOCET/ROXICET) 5-325 MG per tablet 2 tablet (2 tablets Oral Given 06/10/22 1822)    ED Course/ Medical Decision Making/ A&P                           Medical Decision Making Amount and/or Complexity of Data Reviewed Labs: ordered.  Risk Prescription drug management.   Patient with lower extremity pain and swelling.  Increasing redness.  History lymphedema.  This appears to be some worsening of the same although potentially does have cellulitis on top of it.  Lab work reviewed and reassuring.  Given oral pain medicine here.  With increasing swelling and erythema will treat with antibiotics.  Appears stable for discharge home however.  Doubt DVT.  He says extensive work-up for the same in the past.    Will treat with antibiotics.  Can increase her diuretic a little at  home but I think this more lymphedema as opposed to edema.  Appears stable for discharge home with outpatient follow-up        Final Clinical Impression(s) / ED Diagnoses Final diagnoses:  Cellulitis of lower extremity, unspecified laterality  Lymphedema    Rx / DC Orders ED Discharge Orders          Ordered    cephALEXin (KEFLEX)  500 MG capsule  4 times daily        06/10/22 2116              Davonna Belling, MD 06/10/22 2358

## 2022-06-10 NOTE — ED Triage Notes (Signed)
Patient reports worsening bilateral lower legs cellulitis/swelling with redness onset last week , denies fever or chills.

## 2022-06-10 NOTE — ED Provider Triage Note (Addendum)
Emergency Medicine Provider Triage Evaluation Note  [name] , a 51 y.o. female  was evaluated in triage.  Pt complains of LE swelling/redness.  States hx of cellulitis but worse now.  States feels like she is getting blisters on the back of her left leg now.  No fever/chills.  Has home oxycodone that she takes without relief of her pain.  Also reports syncope x2 this week.  Review of Systems  Positive: Leg swelling/redness Negative: fever  Physical Exam  BP 127/80   Pulse 86   Temp 97.6 F (36.4 C) (Oral)   Resp 19   LMP 06/14/2012   SpO2 100%   Gen:   Awake, no distress   Resp:  Normal effort  MSK:   Moves extremities without difficulty  Other:  Cellulitis of BLE, left worse than right, warmth to touch present     Medical Decision Making  Medically screening exam initiated at 2:00 AM.  Appropriate orders placed.  [name] was informed that the remainder of the evaluation will be completed by another provider, this initial triage assessment does not replace that evaluation, and the importance of remaining in the ED until their evaluation is complete.  Redness/swelling of BLE.  Hx of cellulitis.  Labs, cultures ordered.     Larene Pickett, PA-C 06/10/22 0206    Larene Pickett, PA-C 06/10/22 [phone]

## 2022-06-15 LAB — CULTURE, BLOOD (ROUTINE X 2)
Culture: NO GROWTH
Special Requests: ADEQUATE

## 2022-07-08 NOTE — ED Provider Notes (Signed)
This patient left without being seen. I never evaluated this patient. I signed up to see her, however when I arrived to her room the patient was not present. Patient left without being seen but it appears on chart review the patient returned to the ED that night for treatment.    Azucena Cecil, PA-C 07/08/22 1516    Elgie Congo, MD 07/09/22 1504

## 2022-07-26 ENCOUNTER — Other Ambulatory Visit: Payer: Self-pay | Admitting: Internal Medicine

## 2022-07-26 DIAGNOSIS — Z780 Asymptomatic menopausal state: Secondary | ICD-10-CM

## 2022-08-19 ENCOUNTER — Telehealth (HOSPITAL_BASED_OUTPATIENT_CLINIC_OR_DEPARTMENT_OTHER): Payer: Self-pay | Admitting: Emergency Medicine

## 2022-08-19 ENCOUNTER — Emergency Department (HOSPITAL_BASED_OUTPATIENT_CLINIC_OR_DEPARTMENT_OTHER)
Admission: EM | Admit: 2022-08-19 | Discharge: 2022-08-19 | Disposition: A | Discharge status: Home / Self Care | Payer: Medicare Other | Attending: Emergency Medicine | Admitting: Emergency Medicine

## 2022-08-19 ENCOUNTER — Encounter (HOSPITAL_BASED_OUTPATIENT_CLINIC_OR_DEPARTMENT_OTHER): Payer: Self-pay | Admitting: Emergency Medicine

## 2022-08-19 ENCOUNTER — Other Ambulatory Visit: Payer: Self-pay

## 2022-08-19 DIAGNOSIS — T2125XA Burn of second degree of buttock, initial encounter: Secondary | ICD-10-CM | POA: Insufficient documentation

## 2022-08-19 DIAGNOSIS — T310 Burns involving less than 10% of body surface: Secondary | ICD-10-CM | POA: Diagnosis not present

## 2022-08-19 DIAGNOSIS — T2105XA Burn of unspecified degree of buttock, initial encounter: Secondary | ICD-10-CM | POA: Diagnosis present

## 2022-08-19 DIAGNOSIS — X16XXXA Contact with hot heating appliances, radiators and pipes, initial encounter: Secondary | ICD-10-CM | POA: Diagnosis not present

## 2022-08-19 MED ORDER — CEPHALEXIN 500 MG PO CAPS: ORAL_CAPSULE | ORAL | 0 refills | Status: AC

## 2022-08-19 MED ORDER — SILVER SULFADIAZINE 1 % EX CREA: 1.0000 | TOPICAL_CREAM | Freq: Every day | CUTANEOUS | 0 refills | Status: AC

## 2022-08-19 MED ORDER — SILVER SULFADIAZINE 1 % EX CREA
TOPICAL_CREAM | Freq: Once | CUTANEOUS | Status: AC
  Filled 2022-08-19: qty 85

## 2022-08-19 MED ORDER — OXYCODONE-ACETAMINOPHEN 7.5-325 MG PO TABS: 1.0000 | ORAL_TABLET | Freq: Three times a day (TID) | ORAL | 0 refills | Status: AC | PRN

## 2022-08-19 MED ORDER — ONDANSETRON 4 MG PO TBDP
4.0000 mg | ORAL_TABLET | Freq: Once | ORAL | Status: AC
  Administered 2022-08-19: 4 mg via ORAL
  Filled 2022-08-19: qty 1

## 2022-08-19 MED ORDER — OXYCODONE-ACETAMINOPHEN 5-325 MG PO TABS: 1.0000 | ORAL_TABLET | Freq: Four times a day (QID) | ORAL | 0 refills | Status: AC | PRN

## 2022-08-19 MED ORDER — MORPHINE SULFATE (PF) 4 MG/ML IV SOLN
4.0000 mg | Freq: Once | INTRAVENOUS | Status: AC
  Administered 2022-08-19: 4 mg via INTRAMUSCULAR
  Filled 2022-08-19: qty 1
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
## 2022-08-19 NOTE — Discharge Instructions (Addendum)
Apply the Silvadene cream to your left buttock 2 times daily.  Take the antibiotic as prescribed.
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
## 2022-08-19 NOTE — Telephone Encounter (Signed)
Initial [facility] pharmacy was not able to fill the opioid prescription.  Very brief prescription for opioids were sent to [facility].  The patient was instructed to otherwise double her home dose of opioids, which she appears to fill regularly from review of PDMP.

## 2022-10-20 ENCOUNTER — Encounter (HOSPITAL_BASED_OUTPATIENT_CLINIC_OR_DEPARTMENT_OTHER): Payer: 59 | Admitting: Internal Medicine

## 2022-10-21 ENCOUNTER — Encounter (HOSPITAL_BASED_OUTPATIENT_CLINIC_OR_DEPARTMENT_OTHER): Payer: 59 | Admitting: Internal Medicine

## 2022-10-27 ENCOUNTER — Ambulatory Visit (HOSPITAL_BASED_OUTPATIENT_CLINIC_OR_DEPARTMENT_OTHER): Payer: 59 | Admitting: Internal Medicine

## 2022-11-02 ENCOUNTER — Encounter (HOSPITAL_BASED_OUTPATIENT_CLINIC_OR_DEPARTMENT_OTHER): Payer: 59 | Attending: Internal Medicine | Admitting: Internal Medicine

## 2022-11-02 DIAGNOSIS — W19XXXA Unspecified fall, initial encounter: Secondary | ICD-10-CM | POA: Diagnosis not present

## 2022-11-02 DIAGNOSIS — L03116 Cellulitis of left lower limb: Secondary | ICD-10-CM | POA: Insufficient documentation

## 2022-11-02 DIAGNOSIS — T2135XA Burn of third degree of buttock, initial encounter: Secondary | ICD-10-CM | POA: Insufficient documentation

## 2022-11-02 DIAGNOSIS — I872 Venous insufficiency (chronic) (peripheral): Secondary | ICD-10-CM | POA: Diagnosis not present

## 2022-11-02 DIAGNOSIS — I890 Lymphedema, not elsewhere classified: Secondary | ICD-10-CM | POA: Diagnosis not present

## 2022-11-05 NOTE — Progress Notes (Signed)
EMMARAE, DELAHUNTY ([id number]) [phone].pdf Page 1 of 4 Visit Report for 11/02/2022 Abuse Risk Screen Details Patient Name: Date of Service: Midge Aver NI L. 11/02/2022 8:00 A M Medical Record Number: [id number] Patient Account Number: [id number] Date of Birth/Sex: Treating RN: February 27, 1971 (52 y.o. [name], [name] Primary Care Bobbie Virden: Sandi Mariscal Other Clinician: Referring Marveen Donlon: Treating Thoams Siefert/Extender: [name] in Treatment: 0 Abuse Risk Screen Items Answer ABUSE RISK SCREEN: Has anyone close to you tried to hurt or harm you recentlyo No Do you feel uncomfortable with anyone in your familyo No Has anyone forced you do things that you didnt want to doo No Electronic Signature(s) Signed: 11/03/2022 4:33:02 PM By: Rhae Hammock RN Entered By: Rhae Hammock on 11/02/2022 08:11:54 -------------------------------------------------------------------------------- Activities of Daily Living Details Patient Name: Date of Service: Midge Aver NI L. 11/02/2022 8:00 A M Medical Record Number: [id number] Patient Account Number: [id number] Date of Birth/Sex: Treating RN: [birth year] (52 y.o. [name], [name] Primary Care Shondrika Hoque: Sandi Mariscal Other Clinician: Referring Ranell Finelli: Treating Davelle Anselmi/Extender: [name] in Treatment: 0 Activities of Daily Living Items Answer Activities of Daily Living (Please select one for each item) Drive Automobile Completely Able T Medications ake Completely Able Use T elephone Completely Able Care for Appearance Completely Able Use T oilet Completely Able Bath / Shower Completely Able Dress Self Completely Able Feed Self Completely Able Walk Completely Able Get In / Out Bed Completely Able Housework Completely Able Prepare Meals Completely [place] Completely Able Shop for Self Completely Able Electronic Signature(s) Signed: 11/03/2022 4:33:02 PM  By: Rhae Hammock RN Entered By: Rhae Hammock on 11/02/2022 08:12:35 -------------------------------------------------------------------------------- Education Screening Details Patient Name: Date of Service: [name], [name] 11/02/2022 8:00 A M Medical Record Number: [id number] Patient Account Number: [id number] Date of Birth/Sex: Treating RN: 05-15-1971 (52 y.o. [name], [name] Primary Care Cyndi Montejano: Sandi Mariscal Other Clinician: Referring Raileigh Sabater: Treating Greenley Martone/Extender: [name] in Treatment: 0 ELIZABETH, ENGEMAN ([id number]) [phone] [id number] Page 2 of 4 Primary Learner Assessed: Patient Learning Preferences/Education Level/Primary Language Learning Preference: Explanation, Demonstration, Communication Board, Printed Material Highest Education Level: [employer] or Above Preferred Language: Diplomatic Services operational officer Language Barrier: No Translator Needed: No Memory Deficit: No Emotional Barrier: No Cultural/Religious Beliefs Affecting Medical Care: No Physical Barrier Impaired Vision: No Impaired Hearing: No Decreased Hand dexterity: No Knowledge/Comprehension Knowledge Level: High Comprehension Level: High Ability to understand written instructions: High Ability to understand verbal instructions: High Motivation Anxiety Level: Calm Cooperation: Cooperative Education Importance: Denies Need Interest in Health Problems: Asks Questions Perception: Coherent Willingness to Engage in Self-Management High Activities: Readiness to Engage in Self-Management High Activities: Electronic Signature(s) Signed: 11/03/2022 4:33:02 PM By: Rhae Hammock RN Entered By: Rhae Hammock on 11/02/2022 08:13:06 -------------------------------------------------------------------------------- Fall Risk Assessment Details Patient Name: Date of Service: [name], [name] 11/02/2022 8:00 A M Medical Record Number:  [id number] Patient Account Number: [id number] Date of Birth/Sex: Treating RN: 14-Jan-1971 (52 y.o. [name], [name] Primary Care Sunnie Odden: Sandi Mariscal Other Clinician: Referring Cervando Durnin: Treating Aeson Sawyers/Extender: [name] in Treatment: 0 Fall Risk Assessment Items Have you had 2 or more falls in the last 12 monthso 0 No Have you had any fall that resulted in injury in the last 12 monthso 0 No FALLS RISK SCREEN History of falling - immediate or within 3 months 0 No Secondary diagnosis (Do you have 2 or more medical diagnoseso) 0 No Ambulatory aid None/bed rest/wheelchair/nurse 0  No Crutches/cane/walker 0 No Furniture 0 No Intravenous therapy Access/Saline/Heparin Lock 0 No Gait/Transferring Normal/ bed rest/ wheelchair 0 No Weak (short steps with or without shuffle, stooped but able to lift head while walking, may seek 0 No support from furniture) Impaired (short steps with shuffle, may have difficulty arising from chair, head down, impaired 0 No balance) Mental Status Oriented to own ability 0 No Overestimates or forgets limitations 0 No Risk Level: Low Risk Score: 0 Scerbo, Zeta L ([id number]) [id number] Page 3 of 4 Electronic Signature(s) -------------------------------------------------------------------------------- Foot Assessment Details Patient Name: Date of Service: Midge Aver NI L. 11/02/2022 8:00 A M Medical Record Number: [id number] Patient Account Number: [id number] Date of Birth/Sex: Treating RN: [birth year] (52 y.o. [name], [name] Primary Care Brenda Cowher: Sandi Mariscal Other Clinician: Referring Lidiya Reise: Treating Sion Reinders/Extender: [name] in Treatment: 0 Foot Assessment Items Site Locations + = Sensation present, - = Sensation absent, C = Callus, U = Ulcer R = Redness, W = Warmth, M = Maceration, PU = Pre-ulcerative lesion F = Fissure, S = Swelling, D =  Dryness Assessment Right: Left: Other Deformity: No No Prior Foot Ulcer: No No Prior Amputation: No No Charcot Joint: No No Ambulatory Status: Gait: Electronic Signature(s) Signed: 11/03/2022 4:33:02 PM By: Rhae Hammock RN Entered By: Rhae Hammock on 11/02/2022 08:14:52 -------------------------------------------------------------------------------- Nutrition Risk Screening Details Patient Name: Date of Service: [name], [name] 11/02/2022 8:00 A M Medical Record Number: [id number] Patient Account Number: [id number] Date of Birth/Sex: Treating RN: [birth year] (52 y.o. [name], [name] Primary Care Windle Huebert: Sandi Mariscal Other Clinician: Referring Jouri Threat: Treating Musette Kisamore/Extender: [name] in Treatment: 0 Height (in): 60 Weight (lbs): 178 Body Mass Index (BMI): 34.8 Smucker, Aislin L ([id number]) [phone] [id number] Page 4 of 4 Nutrition Risk Screening Items Score Screening NUTRITION RISK SCREEN: I have an illness or condition that made me change the kind and/or amount of food I eat 0 No I eat fewer than two meals per day 0 No I eat few fruits and vegetables, or milk products 0 No I have three or more drinks of beer, liquor or wine almost every day 0 No I have tooth or mouth problems that make it hard for me to eat 0 No I don't always have enough money to buy the food I need 0 No I eat alone most of the time 0 No I take three or more different prescribed or over-the-counter drugs a day 0 No Without wanting to, I have lost or gained 10 pounds in the last six months 0 No I am not always physically able to shop, cook and/or feed myself 0 No Nutrition Protocols Good Risk Protocol 0 No interventions needed Moderate Risk Protocol High Risk Proctocol Risk Level: Good Risk Score: 0 Electronic Signature(s) Signed: 11/03/2022 4:33:02 PM By: Rhae Hammock RN Entered By: Rhae Hammock on 11/02/2022 [id number]
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
## 2022-11-09 ENCOUNTER — Encounter (HOSPITAL_BASED_OUTPATIENT_CLINIC_OR_DEPARTMENT_OTHER): Payer: 59 | Admitting: Internal Medicine

## 2022-11-09 DIAGNOSIS — S81802A Unspecified open wound, left lower leg, initial encounter: Secondary | ICD-10-CM | POA: Diagnosis not present

## 2022-11-09 DIAGNOSIS — T2135XD Burn of third degree of buttock, subsequent encounter: Secondary | ICD-10-CM | POA: Diagnosis not present

## 2022-11-09 DIAGNOSIS — L03116 Cellulitis of left lower limb: Secondary | ICD-10-CM | POA: Diagnosis not present
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
## 2022-11-16 ENCOUNTER — Ambulatory Visit (HOSPITAL_BASED_OUTPATIENT_CLINIC_OR_DEPARTMENT_OTHER): Payer: 59 | Admitting: Physician Assistant

## 2022-11-21 ENCOUNTER — Encounter (HOSPITAL_BASED_OUTPATIENT_CLINIC_OR_DEPARTMENT_OTHER): Payer: 59 | Admitting: Internal Medicine

## 2022-12-06 ENCOUNTER — Encounter (HOSPITAL_BASED_OUTPATIENT_CLINIC_OR_DEPARTMENT_OTHER): Payer: 59 | Admitting: Internal Medicine

## 2022-12-07 ENCOUNTER — Encounter (HOSPITAL_BASED_OUTPATIENT_CLINIC_OR_DEPARTMENT_OTHER): Payer: 59 | Attending: Physician Assistant | Admitting: General Surgery

## 2022-12-07 DIAGNOSIS — F17210 Nicotine dependence, cigarettes, uncomplicated: Secondary | ICD-10-CM | POA: Insufficient documentation

## 2022-12-07 DIAGNOSIS — I890 Lymphedema, not elsewhere classified: Secondary | ICD-10-CM | POA: Diagnosis not present

## 2022-12-07 DIAGNOSIS — L03116 Cellulitis of left lower limb: Secondary | ICD-10-CM | POA: Insufficient documentation

## 2022-12-07 DIAGNOSIS — X58XXXD Exposure to other specified factors, subsequent encounter: Secondary | ICD-10-CM | POA: Diagnosis not present

## 2022-12-07 DIAGNOSIS — T2125XD Burn of second degree of buttock, subsequent encounter: Secondary | ICD-10-CM | POA: Insufficient documentation
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
## 2022-12-21 ENCOUNTER — Encounter (HOSPITAL_BASED_OUTPATIENT_CLINIC_OR_DEPARTMENT_OTHER): Payer: 59 | Admitting: General Surgery

## 2022-12-21 DIAGNOSIS — T2125XD Burn of second degree of buttock, subsequent encounter: Secondary | ICD-10-CM | POA: Diagnosis not present

## 2022-12-22 ENCOUNTER — Encounter (HOSPITAL_BASED_OUTPATIENT_CLINIC_OR_DEPARTMENT_OTHER): Payer: 59 | Admitting: General Surgery
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
## 2022-12-30 ENCOUNTER — Encounter: Payer: 59 | Admitting: Student

## 2023-01-04 ENCOUNTER — Encounter (HOSPITAL_BASED_OUTPATIENT_CLINIC_OR_DEPARTMENT_OTHER): Payer: 59 | Attending: General Surgery | Admitting: General Surgery

## 2023-01-04 DIAGNOSIS — T2125XA Burn of second degree of buttock, initial encounter: Secondary | ICD-10-CM | POA: Insufficient documentation

## 2023-01-04 DIAGNOSIS — L03116 Cellulitis of left lower limb: Secondary | ICD-10-CM | POA: Insufficient documentation

## 2023-01-04 DIAGNOSIS — I890 Lymphedema, not elsewhere classified: Secondary | ICD-10-CM | POA: Diagnosis not present

## 2023-01-04 DIAGNOSIS — X16XXXA Contact with hot heating appliances, radiators and pipes, initial encounter: Secondary | ICD-10-CM | POA: Insufficient documentation

## 2023-01-18 ENCOUNTER — Inpatient Hospital Stay: Admission: RE | Admit: 2023-01-18 | Payer: Medicare Other | Source: Ambulatory Visit

## 2023-01-18 ENCOUNTER — Ambulatory Visit (HOSPITAL_BASED_OUTPATIENT_CLINIC_OR_DEPARTMENT_OTHER): Payer: 59 | Admitting: General Surgery

## 2023-01-18 ENCOUNTER — Other Ambulatory Visit: Payer: Self-pay | Admitting: Internal Medicine

## 2023-01-18 DIAGNOSIS — Z780 Asymptomatic menopausal state: Secondary | ICD-10-CM

## 2023-01-24 ENCOUNTER — Encounter (HOSPITAL_BASED_OUTPATIENT_CLINIC_OR_DEPARTMENT_OTHER): Payer: 59 | Admitting: General Surgery

## 2023-01-31 ENCOUNTER — Ambulatory Visit (HOSPITAL_BASED_OUTPATIENT_CLINIC_OR_DEPARTMENT_OTHER): Payer: 59 | Admitting: General Surgery

## 2023-02-06 ENCOUNTER — Encounter: Payer: 59 | Admitting: Obstetrics

## 2023-02-07 ENCOUNTER — Ambulatory Visit (HOSPITAL_BASED_OUTPATIENT_CLINIC_OR_DEPARTMENT_OTHER): Payer: 59 | Admitting: General Surgery
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
## 2023-02-21 ENCOUNTER — Ambulatory Visit (HOSPITAL_BASED_OUTPATIENT_CLINIC_OR_DEPARTMENT_OTHER): Payer: 59 | Admitting: General Surgery

## 2023-03-23 ENCOUNTER — Other Ambulatory Visit: Payer: Self-pay | Admitting: Internal Medicine

## 2023-03-23 DIAGNOSIS — Z1231 Encounter for screening mammogram for malignant neoplasm of breast: Secondary | ICD-10-CM

## 2023-04-25 ENCOUNTER — Ambulatory Visit: Payer: 59

## 2023-05-03 ENCOUNTER — Ambulatory Visit: Payer: 59

## 2023-05-04 ENCOUNTER — Encounter: Payer: 59 | Admitting: Internal Medicine

## 2023-05-15 ENCOUNTER — Ambulatory Visit: Payer: 59 | Admitting: Cardiovascular Disease

## 2023-05-18 ENCOUNTER — Ambulatory Visit: Payer: 59

## 2023-06-05 ENCOUNTER — Encounter: Payer: 59 | Admitting: Internal Medicine

## 2023-06-05 NOTE — Progress Notes (Deleted)
Office Visit Note  Patient: [name]             Date of Birth: [birth year]           MRN: [id number]             PCP: Salli Real, MD Referring: Salli Real, MD Visit Date: 06/05/2023 Occupation: @GUAROCC @  Subjective:  No chief complaint on file.   History of Present Illness: [name] is a 52 y.o. female here for rheumatoid arthritis she has been on treatment with Humira prescribed from her PCP for several years does not have regular rheumatology follow up. Previous evaluation with D.r Ang in 2017 concern for sjogren syndrome that was unremarkable.***     Activities of Daily Living:  Patient reports morning stiffness for *** {minute/hour:19697}.   Patient {ACTIONS;DENIES/REPORTS:21021675::"Denies"} nocturnal pain.  Difficulty dressing/grooming: {ACTIONS;DENIES/REPORTS:21021675::"Denies"} Difficulty climbing stairs: {ACTIONS;DENIES/REPORTS:21021675::"Denies"} Difficulty getting out of chair: {ACTIONS;DENIES/REPORTS:21021675::"Denies"} Difficulty using hands for taps, buttons, cutlery, and/or writing: {ACTIONS;DENIES/REPORTS:21021675::"Denies"}  No Rheumatology ROS completed.   PMFS History:  Patient Active Problem List   Diagnosis Date Noted   Benzodiazepine withdrawal (HCC) 05/17/2022   unwitnessed syncope 05/17/2022   Chronic acquired lymphedema 05/17/2022   Bipolar affective disorder, depressed, severe, with psychotic behavior (HCC) 03/30/2021   Cannabis abuse/tobacco abuse  03/30/2021   Suicidal ideation 03/28/2021   PTSD (post-traumatic stress disorder) 04/09/2020   Severe bipolar I disorder, current or most recent episode depressed (HCC) 04/08/2020   Opiate dependence (HCC) 04/08/2020   Inadequate sleep hygiene 04/06/2017   Snoring 04/06/2017   Sleep apnea 04/06/2017   Sleep deprivation 04/06/2017   Genetic testing 08/24/2016   Family history of ovarian cancer    Family history of uterine cancer    Family history of prostate cancer    Dysphagia  06/23/2016   Cellulitis 03/02/2016   Loss of weight 02/04/2016   Diarrhea of presumed infectious origin 02/04/2016   Generalized abdominal pain 02/04/2016   Internal hemorrhoid, bleeding 04/24/2014   Irregular menstrual cycle 06/22/2012   TOBACCO ABUSE 10/19/2009   SYNCOPE 10/19/2009   SYNCOPE, HX OF 10/19/2009   Candidiasis of esophagus (HCC) 06/17/2009   LEG PAIN, LEFT 12/22/2008   Alopecia 06/03/2008   HEMORRHOIDS, INTERNAL, WITH BLEEDING 11/01/2007   VITAMIN B12 DEFICIENCY 09/05/2007   Benign neoplasm of skin 08/14/2007   Disease of blood and blood forming organ 07/03/2007   MELANOMA, TRUNK, HX OF 07/03/2007   HYPERCHOLESTEROLEMIA 05/31/2007   Other bipolar disorder (HCC) 05/31/2007   PSORIASIS 05/31/2007   INSOMNIA 05/31/2007   Cocaine abuse (HCC) 05/14/2007   Gingival and periodontal disease 05/14/2007   Gastritis and gastroduodenitis 05/14/2007   IBS (irritable bowel syndrome) 05/14/2007   DIVERTICULOSIS, COLON, HX OF 05/14/2007   MYALGIA, HX OF 05/14/2007    Past Medical History:  Diagnosis Date   Anxiety    Arthritis    left knee   Astigmatism    both eyes   Cancer (HCC)    melanoma removed from back    Cataract    left eye   Depression    Diverticulosis    Erosive esophagitis    Family history of ovarian cancer    Family history of prostate cancer    Family history of uterine cancer    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   Jaundice    MRSA (methicillin resistant Staphylococcus aureus)    hospitalized for 48 hours in Dec 2016   Staph infection    "in my blood"    Family History  Problem Relation Age of Onset   Heart disease Father    Stroke Father    Heart attack Father    Skin cancer Father    Diabetes Father    Uterine cancer Mother 74   COPD  Mother    Cancer - Colon Maternal Grandfather        mets to bone   Stomach cancer Paternal Grandmother    Cervical cancer Sister        dx between 32-33   Migraines Sister    Prostate cancer Maternal Uncle 59       mets to bone   Ovarian cancer Paternal Aunt    Heart attack Paternal Grandfather    Skin cancer Paternal Aunt    Past Surgical History:  Procedure Laterality Date   CHOLECYSTECTOMY  08/02/2012   Procedure: LAPAROSCOPIC CHOLECYSTECTOMY WITH INTRAOPERATIVE CHOLANGIOGRAM;  Surgeon: Emelia Loron, MD;  Location: [facility] OR;  Service: General;  Laterality: N/A;   COLONOSCOPY WITH PROPOFOL  04/09/2014   COLONOSCOPY WITH PROPOFOL N/A 07/14/2016   Procedure: COLONOSCOPY WITH PROPOFOL;  Surgeon: Rachael Fee, MD;  Location: [facility];  Service: Endoscopy;  Laterality: N/A;   DILATION AND CURETTAGE OF UTERUS     w/ hysteroscopy to remove cyst   ESOPHAGOGASTRODUODENOSCOPY N/A 12/02/2017   Procedure: ESOPHAGOGASTRODUODENOSCOPY (EGD);  Surgeon: Jeani Hawking, MD;  Location: [facility];  Service: Endoscopy;  Laterality: N/A;   ESOPHAGOGASTRODUODENOSCOPY (EGD) WITH PROPOFOL  04/09/2014   ESOPHAGOGASTRODUODENOSCOPY (EGD) WITH PROPOFOL N/A 07/14/2016   Procedure: ESOPHAGOGASTRODUODENOSCOPY (EGD) WITH PROPOFOL;  Surgeon: Rachael Fee, MD;  Location: [facility];  Service: Endoscopy;  Laterality: N/A;   HYSTEROSCOPY WITH D & C  06/22/2012   Procedure: DILATATION AND CURETTAGE /HYSTEROSCOPY;  Surgeon: Antionette Char, MD;  Location: [facility];  Service: Gynecology;  Laterality: N/A;   KNEE ARTHROSCOPY  2011   left   ORIF TOE FRACTURE Left 11/05/2014   Procedure: OPEN REDUCTION INTERNAL FIXATION (ORIF) LEFT FIFTH METATARSAL (TOE) FRACTURE WITH CALCANEAL BONE GRAFT;  Surgeon: Jodi Geralds, MD;  Location: [facility];  Service: Orthopedics;  Laterality: Left;   WISDOM TOOTH EXTRACTION     Social History   Social History Narrative   Lives at home w/ significant other    Right-handed   Caffeine: coffee "all day"   Immunization History  Administered Date(s) Administered   Td 11/27/2005     Objective: Vital Signs: LMP 06/14/2012    Physical Exam   Musculoskeletal Exam: ***  CDAI Exam: CDAI Score: -- Patient Global: --; Provider Global: -- Swollen: --; Tender: -- Joint Exam 06/05/2023   No joint exam has been documented for this visit   There is currently no information documented on the homunculus. Go to the Rheumatology activity and complete the homunculus joint exam.  Investigation: No additional findings.  Imaging: No results found.  Recent Labs: Lab Results  Component Value Date   WBC 7.7 06/10/2022   HGB 13.4 06/10/2022   PLT 331 06/10/2022   NA 142 06/10/2022   K 3.6 06/10/2022   CL 107 06/10/2022   CO2 28 06/10/2022   GLUCOSE 95 06/10/2022   BUN 9 06/10/2022   CREATININE 0.95 06/10/2022   BILITOT 0.2 (L) 06/10/2022  ALKPHOS 72 06/10/2022   AST 12 (L) 06/10/2022   ALT 13 06/10/2022   PROT 7.1 06/10/2022   ALBUMIN 3.9 06/10/2022   CALCIUM 9.1 06/10/2022   GFRAA >60 05/14/2020    Speciality Comments: No specialty comments available.  Procedures:  No procedures performed Allergies: Codeine   Assessment / Plan:     Visit Diagnoses: No diagnosis found.  Orders: No orders of the defined types were placed in this encounter.  No orders of the defined types were placed in this encounter.   Face-to-face time spent with patient was *** minutes. Greater than 50% of time was spent in counseling and coordination of care.  Follow-Up Instructions: No follow-ups on file.   Fuller Plan, MD  Note - This record has been created using [employer].  Chart creation errors have been sought, but may not always  have been located. Such creation errors do not reflect on  the standard of medical care.

## 2023-06-07 ENCOUNTER — Ambulatory Visit: Payer: 59 | Admitting: Cardiovascular Disease

## 2023-06-08 ENCOUNTER — Ambulatory Visit: Payer: 59

## 2023-06-09 ENCOUNTER — Encounter: Payer: 59 | Admitting: Internal Medicine

## 2023-06-09 ENCOUNTER — Other Ambulatory Visit: Payer: Self-pay

## 2023-06-09 ENCOUNTER — Emergency Department (HOSPITAL_BASED_OUTPATIENT_CLINIC_OR_DEPARTMENT_OTHER)
Admission: EM | Admit: 2023-06-09 | Discharge: 2023-06-09 | Discharge status: Against Medical Advice | Payer: 59 | Attending: Emergency Medicine | Admitting: Emergency Medicine

## 2023-06-09 ENCOUNTER — Emergency Department (HOSPITAL_BASED_OUTPATIENT_CLINIC_OR_DEPARTMENT_OTHER): Payer: 59 | Admitting: Radiology

## 2023-06-09 ENCOUNTER — Encounter (HOSPITAL_BASED_OUTPATIENT_CLINIC_OR_DEPARTMENT_OTHER): Payer: Self-pay | Admitting: Emergency Medicine

## 2023-06-09 DIAGNOSIS — R2242 Localized swelling, mass and lump, left lower limb: Secondary | ICD-10-CM | POA: Diagnosis present

## 2023-06-09 DIAGNOSIS — Z5321 Procedure and treatment not carried out due to patient leaving prior to being seen by health care provider: Secondary | ICD-10-CM | POA: Insufficient documentation

## 2023-06-09 DIAGNOSIS — R079 Chest pain, unspecified: Secondary | ICD-10-CM | POA: Diagnosis not present

## 2023-06-09 LAB — TROPONIN I (HIGH SENSITIVITY): Troponin I (High Sensitivity): <2 ng/L (ref <18)

## 2023-06-09 LAB — CBC
HCT: 39 % (ref 36.0–46.0)
Hemoglobin: 12.6 g/dL (ref 12.0–15.0)
MCH: 31 pg (ref 26.0–34.0)
MCHC: 32.3 g/dL (ref 30.0–36.0)
MCV: 95.8 fL (ref 80.0–100.0)
Platelets: 275 10*3/uL (ref 150–400)
RBC: 4.07 MIL/uL (ref 3.87–5.11)
RDW: 13.2 % (ref 11.5–15.5)
WBC: 5.6 10*3/uL (ref 4.0–10.5)
nRBC: 0 % (ref 0.0–0.2)

## 2023-06-09 LAB — BASIC METABOLIC PANEL
Anion gap: 6 (ref 5–15)
BUN: 12 mg/dL (ref 6–20)
CO2: 29 mmol/L (ref 22–32)
Calcium: 9.1 mg/dL (ref 8.9–10.3)
Chloride: 106 mmol/L (ref 98–111)
Creatinine, Ser: 0.97 mg/dL (ref 0.44–1.00)
GFR, Estimated: >60 mL/min (ref >60)
Glucose, Bld: 94 mg/dL (ref 70–99)
Potassium: 3.9 mmol/L (ref 3.5–5.1)
Sodium: 141 mmol/L (ref 135–145)

## 2023-06-09 NOTE — ED Triage Notes (Signed)
Left leg red swelling (cellulitis) worse for last few days Reports some chest pain for last week

## 2023-07-06 ENCOUNTER — Ambulatory Visit: Payer: 59

## 2023-07-25 ENCOUNTER — Ambulatory Visit: Payer: 59 | Attending: Cardiovascular Disease | Admitting: Cardiovascular Disease

## 2023-07-25 ENCOUNTER — Encounter: Payer: Self-pay | Admitting: Cardiovascular Disease

## 2023-07-25 VITALS — BP 134/88 | HR 88 | Ht 60.0 in | Wt 176.4 lb

## 2023-07-25 DIAGNOSIS — G4733 Obstructive sleep apnea (adult) (pediatric): Secondary | ICD-10-CM

## 2023-07-25 DIAGNOSIS — R072 Precordial pain: Secondary | ICD-10-CM

## 2023-07-25 DIAGNOSIS — E7800 Pure hypercholesterolemia, unspecified: Secondary | ICD-10-CM | POA: Diagnosis not present

## 2023-07-25 DIAGNOSIS — R0789 Other chest pain: Secondary | ICD-10-CM

## 2023-07-25 DIAGNOSIS — R7303 Prediabetes: Secondary | ICD-10-CM

## 2023-07-25 DIAGNOSIS — J42 Unspecified chronic bronchitis: Secondary | ICD-10-CM

## 2023-07-25 DIAGNOSIS — I4719 Other supraventricular tachycardia: Secondary | ICD-10-CM | POA: Diagnosis not present

## 2023-07-25 DIAGNOSIS — L03115 Cellulitis of right lower limb: Secondary | ICD-10-CM

## 2023-07-25 DIAGNOSIS — I890 Lymphedema, not elsewhere classified: Secondary | ICD-10-CM

## 2023-07-25 MED ORDER — ROSUVASTATIN CALCIUM 20 MG PO TABS: 20.0000 mg | ORAL_TABLET | Freq: Every day | ORAL | 3 refills | Status: DC

## 2023-07-25 MED ORDER — METOPROLOL TARTRATE 100 MG PO TABS: 100.0000 mg | ORAL_TABLET | Freq: Once | ORAL | 0 refills | Status: AC

## 2023-07-25 MED ORDER — CEPHALEXIN 500 MG PO CAPS: 500.0000 mg | ORAL_CAPSULE | Freq: Two times a day (BID) | ORAL | 0 refills | Status: AC

## 2023-07-25 NOTE — Progress Notes (Signed)
Cardiology Office Note:    Date:  07/25/2023   ID:  [name], DOB 10-10-1970, MRN [id number]  PCP:  Salli Real, MD   [facility] Providers Cardiologist:  None     Referring MD: Salli Real, MD   No chief complaint on file. [name] is a 52 y.o. female who is being seen today for the evaluation of chest pain at the request of Sun, Yun, MD.   History of Present Illness:    [name] is a 52 y.o. female  with a reported history of atrial fibrillation, "stage three" lymphedema, and a strong family history of heart disease, presents with multiple concerning symptoms. They report experiencing chest pain described as a heavy feeling, as if something is sitting on their chest, making it hard for them to breathe. This discomfort can occur at any time, even waking them up in the middle of the night, and has been increasing in frequency.   They also note increased sleepiness, despite previously being an insomniac, and waking up every two hours during the night.  She has had a sleep study that showed per her report an apnea index of approximately 25/h and has been prescribed CPAP, which she will start using next month after she receives full instructions.  In addition to these symptoms, the patient has been experiencing swelling in both legs, with the left leg being more severely affected. The left leg is described as very swollen and warm to the touch, and the patient has been experiencing fever and chills for about a week. They have a history of cellulitis and have previously required antibiotics for treatment.  The patient is a long-term smoker, having smoked a pack a day for approximately 37 years. They also have a diagnosis of prediabetes and have been noted to have borderline high cholesterol levels.   In October 2023 an echocardiogram showed normal findings with EF 60 to 65%, normal diastolic function, normal right atrial pressure, no serious valvular abnormalities.   BNP was very normal at 17.  ECG performed last month shows normal sinus rhythm and delayed R wave progression.  Today's ECG shows nonspecific T wave changes.  03/16/2023 labs show total cholesterol 213, triglycerides 133, HDL 56, LDL 130, creatinine 0.9, potassium 3.8, normal liver function tests, hemoglobin 13.4  The patient's family history is significant for heart disease, with both parents and an aunt having suffered heart attacks at relatively young ages. Their father had nine heart attacks and eleven surgeries, with the first heart attack occurring at age 30. Their mother had her first heart attack at age 26, and their aunt also passed away from a heart attack.   UDS over many years show evidence of use of benzodiazepines and opiates (or which she has prescriptions) also habitual use of THC.  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   Jaundice    MRSA (methicillin resistant Staphylococcus aureus)    hospitalized for 48 hours in Dec 2016   Staph infection    "in my blood"    Past Surgical History:  Procedure Laterality Date   CHOLECYSTECTOMY  08/02/2012   Procedure: LAPAROSCOPIC CHOLECYSTECTOMY WITH INTRAOPERATIVE CHOLANGIOGRAM;  Surgeon: Emelia Loron, MD;  Location: [facility] OR;  Service: General;  Laterality: N/A;   COLONOSCOPY WITH PROPOFOL  04/09/2014   COLONOSCOPY WITH PROPOFOL N/A 07/14/2016   Procedure: COLONOSCOPY WITH PROPOFOL;  Surgeon: Rachael Fee, MD;  Location: [facility] ENDOSCOPY;  Service: Endoscopy;  Laterality: N/A;   DILATION AND CURETTAGE OF UTERUS     w/ hysteroscopy to remove cyst   ESOPHAGOGASTRODUODENOSCOPY N/A 12/02/2017   Procedure: ESOPHAGOGASTRODUODENOSCOPY (EGD);  Surgeon: Jeani Hawking, MD;  Location: [facility] ENDOSCOPY;  Service: Endoscopy;  Laterality: N/A;   ESOPHAGOGASTRODUODENOSCOPY (EGD) WITH PROPOFOL  04/09/2014   ESOPHAGOGASTRODUODENOSCOPY (EGD) WITH PROPOFOL N/A 07/14/2016   Procedure: ESOPHAGOGASTRODUODENOSCOPY (EGD) WITH PROPOFOL;  Surgeon: Rachael Fee, MD;  Location: [facility] ENDOSCOPY;  Service: Endoscopy;  Laterality: N/A;   HYSTEROSCOPY WITH D & C  06/22/2012   Procedure: DILATATION AND CURETTAGE /HYSTEROSCOPY;  Surgeon: Antionette Char, MD;  Location: [facility] ORS;  Service: Gynecology;  Laterality: N/A;   KNEE ARTHROSCOPY  2011   left   ORIF TOE FRACTURE Left 11/05/2014   Procedure: OPEN REDUCTION INTERNAL FIXATION (ORIF) LEFT FIFTH METATARSAL (TOE) FRACTURE WITH CALCANEAL BONE GRAFT;  Surgeon: Jodi Geralds, MD;  Location: [facility];  Service: Orthopedics;  Laterality: Left;   WISDOM TOOTH EXTRACTION      Current Medications: Current Meds  Medication Sig   cephALEXin (KEFLEX) 500 MG capsule Take 1 capsule (500 mg total) by mouth every 12 (twelve) hours for 10 days.   clonazePAM (KLONOPIN) 1 MG tablet Take 1 mg by mouth 3 (three) times daily.   gabapentin (NEURONTIN) 600 MG tablet Take 600 mg by mouth 3 (three) times daily.   lamoTRIgine (LAMICTAL) 25 MG tablet Take 25 mg by mouth 2 (two) times daily.   loperamide (IMODIUM) 2 MG capsule Take 2 mg by mouth as needed.   metoprolol tartrate (LOPRESSOR) 100 MG tablet Take 1 tablet (100 mg total) by mouth once for 1 dose. PLEASE TAKE METOPROLOL 2  HOURS PRIOR TO CTA SCAN.   mirtazapine (REMERON) 15 MG tablet Take 1 tablet (15 mg total) by mouth at bedtime.   oxyCODONE-acetaminophen (PERCOCET/ROXICET) 5-325 MG tablet Take 1 tablet by mouth every 6 (six)  hours as needed for up to 6 doses for severe pain.   pantoprazole (PROTONIX) 40 MG tablet Take 40 mg by mouth daily.   Potassium Chloride ER 20 MEQ TBCR Take 20 mEq by mouth 2 (two) times daily as needed.   prazosin (MINIPRESS) 5 MG capsule Take 5 mg by mouth at bedtime.   QUEtiapine (SEROQUEL) 25 MG tablet Take 1 tablet (25 mg total) by mouth 2 (two) times daily.   QUEtiapine (SEROQUEL) 300 MG tablet Take 1 tablet (300 mg total) by mouth at bedtime.   rosuvastatin (CRESTOR) 20 MG tablet Take 1 tablet (20 mg total) by mouth daily.   sertraline (ZOLOFT) 50 MG tablet Take 50 mg by mouth every morning.   sucralfate (CARAFATE) 1 g tablet Take 1 g by mouth in the morning, at noon, and at bedtime.   topiramate (TOPAMAX) 100 MG tablet Take 100  mg by mouth at bedtime.   zolpidem (AMBIEN) 10 MG tablet Take 10 mg by mouth at bedtime as needed.     Allergies:   Codeine and Prozac [fluoxetine]   Social History   Socioeconomic History   Marital status: Divorced    Spouse name: Not on file   Number of children: 0   Years of education: College   Highest education level: Not on file  Occupational History   Occupation: Disability  Tobacco Use   Smoking status: Every Day    Current packs/day: 1.00    Average packs/day: 1 pack/day for 33.0 years (33.0 ttl pk-yrs)    Types: Cigarettes   Smokeless tobacco: Never   Tobacco comments:    form given 02-04-16     07/25/2023 Patient smokes about a pack a day  Substance and Sexual Activity   Alcohol use: No    Alcohol/week: 0.0 standard drinks of alcohol   Drug use: No    Types: Marijuana    Comment: history of use   Sexual activity: Yes    Birth control/protection: Post-menopausal  Other Topics Concern   Not on file  Social History Narrative   Lives at home w/ significant other   Right-handed   Caffeine: coffee "all day"   Social Determinants of Health   Financial Resource Strain: Not on file  Food Insecurity: Food Insecurity Present  (05/20/2022)   Hunger Vital Sign    Worried About Running Out of Food in the Last Year: Sometimes true    Ran Out of Food in the Last Year: Sometimes true  Transportation Needs: No Transportation Needs (05/20/2022)   PRAPARE - Administrator, Civil Service (Medical): No    Lack of Transportation (Non-Medical): No  Physical Activity: Not on file  Stress: Not on file  Social Connections: Unknown (01/11/2022)   [facility] from [facility]   Social Network    Social Network: Not on file     Family History: The patient's family history includes COPD in her mother; Cancer - Colon in her maternal grandfather; Cervical cancer in her sister; Diabetes in her father; Heart attack in her father and paternal grandfather; Heart disease in her father; Migraines in her sister; Ovarian cancer in her paternal aunt; Prostate cancer (age of onset: 31) in her maternal uncle; Skin cancer in her father and paternal aunt; Stomach cancer in her paternal grandmother; Stroke in her father; Uterine cancer (age of onset: 45) in her mother.  ROS:   Please see the history of present illness.     All other systems reviewed and are negative.  EKGs/Labs/Other Studies Reviewed:    The following studies were reviewed today:  EKG Interpretation Date/Time:  Tuesday July 25 2023 14:08:05 EST Ventricular Rate:  88 PR Interval:  204 QRS Duration:  78 QT Interval:  352 QTC Calculation: 425 R Axis:   52  Text Interpretation: Normal sinus rhythm Septal infarct (cited on or before 10-Jun-2022) When compared with ECG of 09-Jun-2023 19:18, Questionable change in initial forces of Septal leads Nonspecific T wave abnormality, worse in Anterior leads Confirmed by Dahlia Nifong [phone]) on 07/25/2023 2:48:51 PM    Recent Labs: 06/09/2023: BUN 12; Creatinine, Ser 0.97; Hemoglobin 12.6; Platelets 275; Potassium 3.9; Sodium 141  Recent Lipid Panel    Component Value Date/Time   CHOL 192 03/28/2021  0307   TRIG 105 03/28/2021 0307   HDL 49 03/28/2021 0307   CHOLHDL 3.9 03/28/2021 0307   VLDL 21  03/28/2021 0307   LDLCALC 122 (H) 03/28/2021 0307     Risk Assessment/Calculations:                Physical Exam:    VS:  BP 134/88 (BP Location: Left Arm, Patient Position: Sitting, Cuff Size: Normal)   Pulse 88   Ht 5' (1.524 m)   Wt 176 lb 6.4 oz (80 kg)   LMP 06/14/2012   BMI 34.45 kg/m     Wt Readings from Last 3 Encounters:  07/25/23 176 lb 6.4 oz (80 kg)  08/19/22 169 lb (76.7 kg)  06/10/22 165 lb (74.8 kg)     GEN: Moderately obese, well nourished, well developed in no acute distress HEENT: Normal NECK: No JVD; No carotid bruits LYMPHATICS: No lymphadenopathy CARDIAC: RRR, no murmurs, rubs, gallops RESPIRATORY:  Clear to auscultation without rales, wheezing or rhonchi  ABDOMEN: Soft, non-tender, non-distended MUSCULOSKELETAL:  Bilateral 2-3+ pitting edema to the knees, worse on the right where there is also redness, warmth and tenderness  SKIN: above findings consistent for acute cellulitis superimposed on chronic edema NEUROLOGIC:  Alert and oriented x 3, and this PSYCHIATRIC:  Normal affect   ASSESSMENT:    1. Chest tightness   2. Precordial pain   3. Atrial tachycardia (HCC)   4. HYPERCHOLESTEROLEMIA   5. Chronic acquired lymphedema   6. Cellulitis of right lower extremity   7. OSA (obstructive sleep apnea)   8. Chronic bronchitis, unspecified chronic bronchitis type (HCC)   9. Prediabetes    PLAN:    In order of problems listed above:   Chest pain: Referred for evaluation of congestive heart failure, but mostly describes chest pain, in a pattern concerning for exertional angina pectoris, with some acceleration recently. Family history of significant coronary artery disease. Previous echocardiogram and BNP were normal.  Discussed coronary CT angiogram to assess for blockages and overall plaque burden. Explained that the CT scan is a non-invasive  alternative to heart catheterization, providing detailed images of the heart and vessels. Discussed the need for metoprolol to lower heart rate for optimal imaging. Risks include potential need for angioplasty, stent, or bypass surgery if significant blockages are found. Hx of Atrial Fibrillation: reportedly diagnosed over a year ago. I only find documentation of transient atrial tachycardia.  Potential contributor to current symptoms of chest pain and dyspnea. Discussed the importance of monitoring for rhythm issues to correlate symptoms with potential arrhythmias. Get Holter monitor. Hyperlipidemia: Previous labs from 2022 show borderline cholesterol levels. Need for updated lipid profile and potential initiation of statin therapy discussed. Explained that if coronary disease is found, more aggressive lipid lowering will be necessary. Discussed the use of rosuvastatin and potential side effects such as myalgia.  Lymphedema with Suspected Cellulitis: Significant swelling in the left leg, warm to touch, with fever and chills for about a week. History of cellulitis treated with antibiotics. Discussed the use of Keflex for cellulitis, noting its effectiveness for skin infections. Obstructive Sleep Apnea: Diagnosed with obstructive sleep apnea via sleep study. CPAP machine prescribed but not yet in use. Discussed the importance of CPAP adherence for long-term health benefits, including improved sleep quality and reduced risk of atrial fibrillation. Chronic Bronchitis: Symptoms include wheezing, nighttime congestion, and productive cough with thick, sticky sputum. Likely related to long-term smoking. Discussed the importance of smoking cessation to improve respiratory symptoms and overall health. Prediabetes: Borderline diabetes with elevated A1c. No recent lab results available. Discussed the need for updated fasting blood glucose and A1c  tests to monitor glycemic control.             Medication  Adjustments/Labs and Tests Ordered: Current medicines are reviewed at length with the patient today.  Concerns regarding medicines are outlined above.  Orders Placed This Encounter  Procedures   CT CORONARY MORPH W/CTA COR W/SCORE W/CA W/CM &/OR WO/CM   Comprehensive metabolic panel   Lipid panel   Hemoglobin A1c   Lipid panel   EKG 12-Lead   Meds ordered this encounter  Medications   rosuvastatin (CRESTOR) 20 MG tablet    Sig: Take 1 tablet (20 mg total) by mouth daily.    Dispense:  90 tablet    Refill:  3   cephALEXin (KEFLEX) 500 MG capsule    Sig: Take 1 capsule (500 mg total) by mouth every 12 (twelve) hours for 10 days.    Dispense:  20 capsule    Refill:  0   metoprolol tartrate (LOPRESSOR) 100 MG tablet    Sig: Take 1 tablet (100 mg total) by mouth once for 1 dose. PLEASE TAKE METOPROLOL 2  HOURS PRIOR TO CTA SCAN.    Dispense:  1 tablet    Refill:  0    Patient Instructions  Medication Instructions:  Rosuvastatin 20 mg daily Keflex 500 mg every 12 hours for 10 days  *If you need a refill on your cardiac medications before your next appointment, please call your pharmacy*   Lab Work: CMP and Lipid panel- today  Repeat Lipid panel- Please return for Blood Work in 3 months. No appointment needed, lab here at the office is open Monday-Friday from 8AM to 4PM and closed daily for lunch from 12:45-1:45.   If you have labs (blood work) drawn today and your tests are completely normal, you will receive your results only by: MyChart Message (if you have MyChart) OR A paper copy in the mail If you have any lab test that is abnormal or we need to change your treatment, we will call you to review the results.   Testing/Procedures:   Your cardiac CT will be scheduled at one of the below locations:   [facility] [address] [place], [place] [address] [phone]   If scheduled at [facility], please arrive at the [facility] and Children's  Entrance (Entrance C2) of [facility] 30 minutes prior to test start time. You can use the FREE valet parking offered at entrance C (encouraged to control the heart rate for the test)  Proceed to the [facility] Radiology Department (first floor) to check-in and test prep.  All radiology patients and guests should use entrance C2 at [facility], accessed from [facility] even though the hospital's physical address listed is [address].     Please follow these instructions carefully (unless otherwise directed):  An IV will be required for this test and Nitroglycerin will be given.    On the Night Before the Test: Be sure to Drink plenty of water. Do not consume any caffeinated/decaffeinated beverages or chocolate 12 hours prior to your test. Do not take any antihistamines 12 hours prior to your test.  On the Day of the Test: Drink plenty of water until 1 hour prior to the test. Do not eat any food 1 hour prior to test. You may take your regular medications prior to the test.  Take metoprolol (Lopressor) 100 mg  two hours prior to test. If you take Furosemide/Hydrochlorothiazide/Spironolactone, please HOLD  on the morning of the test. FEMALES- please wear underwire-free bra if available, avoid dresses & tight clothing       After the Test: Drink plenty of water. After receiving IV contrast, you may experience a mild flushed feeling. This is normal. On occasion, you may experience a mild rash up to 24 hours after the test. This is not dangerous. If this occurs, you can take Benadryl 25 mg and increase your fluid intake. If you experience trouble breathing, this can be serious. If it is severe call 911 IMMEDIATELY. If it is mild, please call our office. If you take any of these medications: Glipizide/Metformin, Avandament, Glucavance, please do not take 48 hours after completing test unless otherwise instructed.  We will call to schedule your test  2-4 weeks out understanding that some insurance companies will need an authorization prior to the service being performed.   For more information and frequently asked questions, please visit our website : [email or web address]  For non-scheduling related questions, please contact the cardiac imaging nurse navigator should you have any questions/concerns: Cardiac Imaging Nurse Navigators Direct Office Dial: [phone]   For scheduling needs, including cancellations and rescheduling, please call [place], [phone].    Follow-Up: At [facility], you and your health needs are our priority.  As part of our continuing mission to provide you with exceptional heart care, we have created designated Provider Care Teams.  These Care Teams include your primary Cardiologist (physician) and Advanced Practice Providers (APPs -  Physician Assistants and Nurse Practitioners) who all work together to provide you with the care you need, when you need it.  We recommend signing up for the patient portal called "MyChart".  Sign up information is provided on this After Visit Summary.  MyChart is used to connect with patients for Virtual Visits (Telemedicine).  Patients are able to view lab/test results, encounter notes, upcoming appointments, etc.  Non-urgent messages can be sent to your provider as well.   To learn more about what you can do with MyChart, go to [email or web address].    Your next appointment:   APP 4-6 WEEKS (after CT)    Signed, Thurmon Fair, MD  07/25/2023 8:39 PM    [facility]

## 2023-07-25 NOTE — Patient Instructions (Signed)
Medication Instructions:  Rosuvastatin 20 mg daily Keflex 500 mg every 12 hours for 10 days  *If you need a refill on your cardiac medications before your next appointment, please call your pharmacy*   Lab Work: CMP and Lipid panel- today  Repeat Lipid panel- Please return for Blood Work in 3 months. No appointment needed, lab here at the office is open Monday-Friday from 8AM to 4PM and closed daily for lunch from 12:45-1:45.   If you have labs (blood work) drawn today and your tests are completely normal, you will receive your results only by: MyChart Message (if you have MyChart) OR A paper copy in the mail If you have any lab test that is abnormal or we need to change your treatment, we will call you to review the results.   Testing/Procedures:   Your cardiac CT will be scheduled at one of the below locations:   [facility] [address] [place], [place] [address] [phone]   If scheduled at [facility], please arrive at the [facility] and Children's Entrance (Entrance C2) of [facility] 30 minutes prior to test start time. You can use the FREE valet parking offered at entrance C (encouraged to control the heart rate for the test)  Proceed to the [facility] Radiology Department (first floor) to check-in and test prep.  All radiology patients and guests should use entrance C2 at [facility], accessed from [facility], even though the hospital's physical address listed is [address].     Please follow these instructions carefully (unless otherwise directed):  An IV will be required for this test and Nitroglycerin will be given.    On the Night Before the Test: Be sure to Drink plenty of water. Do not consume any caffeinated/decaffeinated beverages or chocolate 12 hours prior to your test. Do not take any antihistamines 12 hours prior to your test.  On the Day of the Test: Drink plenty of water until 1 hour  prior to the test. Do not eat any food 1 hour prior to test. You may take your regular medications prior to the test.  Take metoprolol (Lopressor) 100 mg  two hours prior to test. If you take Furosemide/Hydrochlorothiazide/Spironolactone, please HOLD on the morning of the test. FEMALES- please wear underwire-free bra if available, avoid dresses & tight clothing       After the Test: Drink plenty of water. After receiving IV contrast, you may experience a mild flushed feeling. This is normal. On occasion, you may experience a mild rash up to 24 hours after the test. This is not dangerous. If this occurs, you can take Benadryl 25 mg and increase your fluid intake. If you experience trouble breathing, this can be serious. If it is severe call 911 IMMEDIATELY. If it is mild, please call our office. If you take any of these medications: Glipizide/Metformin, Avandament, Glucavance, please do not take 48 hours after completing test unless otherwise instructed.  We will call to schedule your test 2-4 weeks out understanding that some insurance companies will need an authorization prior to the service being performed.   For more information and frequently asked questions, please visit our website : [email or web address]  For non-scheduling related questions, please contact the cardiac imaging nurse navigator should you have any questions/concerns: Cardiac Imaging Nurse Navigators Direct Office Dial: [phone]   For scheduling needs, including cancellations and rescheduling, please call [place], [phone].    Follow-Up: At [facility], you and  your health needs are our priority.  As part of our continuing mission to provide you with exceptional heart care, we have created designated Provider Care Teams.  These Care Teams include your primary Cardiologist (physician) and Advanced Practice Providers (APPs -  Physician Assistants and Nurse Practitioners) who all work  together to provide you with the care you need, when you need it.  We recommend signing up for the patient portal called "MyChart".  Sign up information is provided on this After Visit Summary.  MyChart is used to connect with patients for Virtual Visits (Telemedicine).  Patients are able to view lab/test results, encounter notes, upcoming appointments, etc.  Non-urgent messages can be sent to your provider as well.   To learn more about what you can do with MyChart, go to [email or web address].    Your next appointment:   APP 4-6 WEEKS (after CT)

## 2023-07-26 ENCOUNTER — Ambulatory Visit: Payer: 59

## 2023-07-26 LAB — COMPREHENSIVE METABOLIC PANEL
ALT: 12 [IU]/L (ref 0–32)
AST: 14 [IU]/L (ref 0–40)
Albumin: 3.9 g/dL (ref 3.8–4.9)
Alkaline Phosphatase: 85 [IU]/L (ref 44–121)
BUN/Creatinine Ratio: 11 (ref 9–23)
BUN: 10 mg/dL (ref 6–24)
Bilirubin Total: <0.2 mg/dL (ref 0.0–1.2)
CO2: 24 mmol/L (ref 20–29)
Calcium: 9.1 mg/dL (ref 8.7–10.2)
Chloride: 108 mmol/L — ABNORMAL HIGH (ref 96–106)
Creatinine, Ser: 0.95 mg/dL (ref 0.57–1.00)
Globulin, Total: 2.7 g/dL (ref 1.5–4.5)
Glucose: 104 mg/dL — ABNORMAL HIGH (ref 70–99)
Potassium: 4.1 mmol/L (ref 3.5–5.2)
Sodium: 145 mmol/L — ABNORMAL HIGH (ref 134–144)
Total Protein: 6.6 g/dL (ref 6.0–8.5)
eGFR: 72 mL/min/{1.73_m2} (ref >59)

## 2023-07-26 LAB — HEMOGLOBIN A1C
Est. average glucose Bld gHb Est-mCnc: 117 mg/dL
Hgb A1c MFr Bld: 5.7 % — ABNORMAL HIGH (ref 4.8–5.6)

## 2023-07-26 LAB — LIPID PANEL
Chol/HDL Ratio: 4.2 {ratio} (ref 0.0–4.4)
Cholesterol, Total: 239 mg/dL — ABNORMAL HIGH (ref 100–199)
HDL: 57 mg/dL (ref >39)
LDL Chol Calc (NIH): 157 mg/dL — ABNORMAL HIGH (ref 0–99)
Triglycerides: 139 mg/dL (ref 0–149)
VLDL Cholesterol Cal: 25 mg/dL (ref 5–40)

## 2023-07-31 ENCOUNTER — Telehealth: Payer: Self-pay | Admitting: Cardiovascular Disease

## 2023-07-31 NOTE — Telephone Encounter (Signed)
Spoke to patient after calling [facility] for Coronary CTA. Informed patient that procedure had not been scheduled and patient preferred to schedule the appt. Patient given the number [phone] to schedule the procedure. Patient voiced an understanding.

## 2023-07-31 NOTE — Telephone Encounter (Signed)
Pt is requesting a callback regarding her being scheduled for her CT CORONARY MORPH W/CTA COR W/SCORE W/CA W/CM &/OR WO/CM at the hospital. Please advise

## 2023-08-01 ENCOUNTER — Ambulatory Visit: Payer: 59

## 2023-08-08 ENCOUNTER — Ambulatory Visit: Payer: 59

## 2023-08-08 ENCOUNTER — Other Ambulatory Visit: Payer: Self-pay | Admitting: Family Medicine

## 2023-08-08 DIAGNOSIS — Z780 Asymptomatic menopausal state: Secondary | ICD-10-CM

## 2023-08-09 ENCOUNTER — Other Ambulatory Visit: Payer: 59

## 2023-08-10 ENCOUNTER — Encounter (HOSPITAL_COMMUNITY): Payer: Self-pay

## 2023-08-14 ENCOUNTER — Ambulatory Visit (HOSPITAL_COMMUNITY): Admission: RE | Admit: 2023-08-14 | Payer: 59 | Source: Ambulatory Visit

## 2023-08-14 ENCOUNTER — Emergency Department (HOSPITAL_BASED_OUTPATIENT_CLINIC_OR_DEPARTMENT_OTHER): Payer: 59 | Admitting: Radiology

## 2023-08-14 ENCOUNTER — Encounter (HOSPITAL_BASED_OUTPATIENT_CLINIC_OR_DEPARTMENT_OTHER): Payer: Self-pay

## 2023-08-14 ENCOUNTER — Other Ambulatory Visit: Payer: Self-pay

## 2023-08-14 ENCOUNTER — Emergency Department (HOSPITAL_BASED_OUTPATIENT_CLINIC_OR_DEPARTMENT_OTHER)
Admission: EM | Admit: 2023-08-14 | Discharge: 2023-08-14 | Disposition: A | Discharge status: Home / Self Care | Payer: 59 | Attending: Emergency Medicine | Admitting: Emergency Medicine

## 2023-08-14 DIAGNOSIS — R051 Acute cough: Secondary | ICD-10-CM | POA: Diagnosis not present

## 2023-08-14 DIAGNOSIS — Z20822 Contact with and (suspected) exposure to covid-19: Secondary | ICD-10-CM | POA: Insufficient documentation

## 2023-08-14 DIAGNOSIS — J029 Acute pharyngitis, unspecified: Secondary | ICD-10-CM | POA: Insufficient documentation

## 2023-08-14 DIAGNOSIS — R0602 Shortness of breath: Secondary | ICD-10-CM | POA: Insufficient documentation

## 2023-08-14 DIAGNOSIS — F17200 Nicotine dependence, unspecified, uncomplicated: Secondary | ICD-10-CM | POA: Insufficient documentation

## 2023-08-14 DIAGNOSIS — R059 Cough, unspecified: Secondary | ICD-10-CM | POA: Diagnosis present

## 2023-08-14 LAB — COMPREHENSIVE METABOLIC PANEL
ALT: 19 U/L (ref 0–44)
AST: 20 U/L (ref 15–41)
Albumin: 3.7 g/dL (ref 3.5–5.0)
Alkaline Phosphatase: 74 U/L (ref 38–126)
Anion gap: 5 (ref 5–15)
BUN: 9 mg/dL (ref 6–20)
CO2: 27 mmol/L (ref 22–32)
Calcium: 8.5 mg/dL — ABNORMAL LOW (ref 8.9–10.3)
Chloride: 108 mmol/L (ref 98–111)
Creatinine, Ser: 1.04 mg/dL — ABNORMAL HIGH (ref 0.44–1.00)
GFR, Estimated: >60 mL/min (ref >60)
Glucose, Bld: 102 mg/dL — ABNORMAL HIGH (ref 70–99)
Potassium: 4 mmol/L (ref 3.5–5.1)
Sodium: 140 mmol/L (ref 135–145)
Total Bilirubin: 0.2 mg/dL (ref <1.2)
Total Protein: 6.6 g/dL (ref 6.5–8.1)

## 2023-08-14 LAB — CBC WITH DIFFERENTIAL/PLATELET
Abs Immature Granulocytes: 0.02 10*3/uL (ref 0.00–0.07)
Basophils Absolute: 0 10*3/uL (ref 0.0–0.1)
Basophils Relative: 1 %
Eosinophils Absolute: 0.2 10*3/uL (ref 0.0–0.5)
Eosinophils Relative: 3 %
HCT: 41.1 % (ref 36.0–46.0)
Hemoglobin: 13 g/dL (ref 12.0–15.0)
Immature Granulocytes: 0 %
Lymphocytes Relative: 41 %
Lymphs Abs: 2.8 10*3/uL (ref 0.7–4.0)
MCH: 30.8 pg (ref 26.0–34.0)
MCHC: 31.6 g/dL (ref 30.0–36.0)
MCV: 97.4 fL (ref 80.0–100.0)
Monocytes Absolute: 0.6 10*3/uL (ref 0.1–1.0)
Monocytes Relative: 9 %
Neutro Abs: 3.2 10*3/uL (ref 1.7–7.7)
Neutrophils Relative %: 46 %
Platelets: 242 10*3/uL (ref 150–400)
RBC: 4.22 MIL/uL (ref 3.87–5.11)
RDW: 12.9 % (ref 11.5–15.5)
WBC: 6.8 10*3/uL (ref 4.0–10.5)
nRBC: 0 % (ref 0.0–0.2)

## 2023-08-14 LAB — RESP PANEL BY RT-PCR (RSV, FLU A&B, COVID)  RVPGX2
Influenza A by PCR: NEGATIVE
Influenza B by PCR: NEGATIVE
Resp Syncytial Virus by PCR: NEGATIVE
SARS Coronavirus 2 by RT PCR: NEGATIVE

## 2023-08-14 MED ORDER — PROMETHAZINE HCL 25 MG PO TABS
25.0000 mg | ORAL_TABLET | Freq: Once | ORAL | Status: AC
  Administered 2023-08-14: 25 mg via ORAL
  Filled 2023-08-14: qty 1

## 2023-08-14 MED ORDER — IPRATROPIUM-ALBUTEROL 0.5-2.5 (3) MG/3ML IN SOLN
3.0000 mL | Freq: Once | RESPIRATORY_TRACT | Status: AC
  Administered 2023-08-14: 3 mL via RESPIRATORY_TRACT
  Filled 2023-08-14: qty 3

## 2023-08-14 MED ORDER — DOXYCYCLINE HYCLATE 100 MG PO CAPS: 100.0000 mg | ORAL_CAPSULE | Freq: Two times a day (BID) | ORAL | 0 refills | Status: AC

## 2023-08-14 MED ORDER — ONDANSETRON 4 MG PO TBDP
4.0000 mg | ORAL_TABLET | Freq: Once | ORAL | Status: DC
  Filled 2023-08-14: qty 1

## 2023-08-14 MED ORDER — LIDOCAINE VISCOUS HCL 2 % MT SOLN
15.0000 mL | OROMUCOSAL | 0 refills | Status: AC | PRN
Start: 2023-08-14 — End: ?

## 2023-08-14 MED ORDER — ALBUTEROL SULFATE (2.5 MG/3ML) 0.083% IN NEBU
2.5000 mg | INHALATION_SOLUTION | Freq: Once | RESPIRATORY_TRACT | Status: AC
  Administered 2023-08-14: 2.5 mg via RESPIRATORY_TRACT
  Filled 2023-08-14: qty 3

## 2023-08-14 NOTE — Discharge Instructions (Addendum)
You were seen today for cough.  This is most likely due to some sort of viral illness.  However due to the way you sounded, I am going to prescribe antibiotics to treat possible bacterial infection.  You can take Tylenol and ibuprofen for pain relief.  Take doxycycline 100 mg 2 times a day for the full 5-day course.  If symptoms do not begin getting better over the course of the next 2 to 3 days return for further workup.   Take Tylenol (acetominophen)  650mg  every 4-6 hours, as needed for pain or fever. Do not take more than 4,000 mg in a 24-hour period. As this may cause liver damage. While this is rare, if you begin to develop yellowing of the skin or eyes, stop taking and return to ER immediately.  Take Ibuprofen 400mg  every 4-6 hours for pain or fever, not exceeding 3,200 mg per day as more than 3,200mg  can cause Stomach irritation, dizziness, kidney issues with long-term use.  These will be helpful in reducing pain, achiness, sore throat.  Return to ER for worsening shortness of breath, chest pain, worsening weakness, confusion, worsening fatigue.  It was a pleasure seeing you in the ER today.

## 2023-08-14 NOTE — ED Triage Notes (Addendum)
In for eval fo productive cough with green/brown sputum, wheezing, chest discomfort, nausea, and diarrhea onset last Tuesday. Sore throat, chills, body aches, and bilateral ear pain.

## 2023-08-14 NOTE — ED Notes (Signed)
PA at bedside.

## 2023-08-14 NOTE — ED Notes (Signed)
Patient updated on discharge delay.

## 2023-08-14 NOTE — ED Provider Notes (Signed)
[place] EMERGENCY DEPARTMENT AT [facility] Provider Note   CSN: [id number] Arrival date & time: 08/14/23  0932     History  Chief Complaint  Patient presents with   Cough    [name] is a 52 y.o. female.   Cough Associated symptoms: shortness of breath and sore throat   Patient presents with a 1 week history of cough, congestion.  She states that she has also been experiencing fever, occasional diarrhea, malaise/chills.  Symptoms have gotten worse over the course of this last week.  Taking Tylenol and ibuprofen for fever.  Unknown sick contacts.  States that shortness of breath has also been present with exertion.  Current smoker.  Endorsed taking marijuana brownies as of yesterday.  Denies any other drug use.     Home Medications Prior to Admission medications   Medication Sig Start Date End Date Taking? Authorizing Provider  clonazePAM (KLONOPIN) 1 MG tablet Take 1 mg by mouth 3 (three) times daily. 09/14/15  Yes [provider]  doxycycline (VIBRAMYCIN) 100 MG capsule Take 1 capsule (100 mg total) by mouth 2 (two) times daily for 5 days. 08/14/23 08/19/23 Yes Lunette Stands, PA-C  gabapentin (NEURONTIN) 600 MG tablet Take 600 mg by mouth 3 (three) times daily. 09/29/20  Yes [provider]  lamoTRIgine (LAMICTAL) 25 MG tablet Take 25 mg by mouth 2 (two) times daily. 05/30/23  Yes [provider]  lidocaine (XYLOCAINE) 2 % solution Use as directed 15 mLs in the mouth or throat as needed for mouth pain. 08/14/23  Yes Lunette Stands, PA-C  mirtazapine (REMERON) 15 MG tablet Take 1 tablet (15 mg total) by mouth at bedtime. 04/05/21  Yes Carlyn Reichert, MD  oxyCODONE-acetaminophen (PERCOCET/ROXICET) 5-325 MG tablet Take 1 tablet by mouth every 6 (six) hours as needed for up to 6 doses for severe pain. 08/19/22  Yes Terald Sleeper, MD  pantoprazole (PROTONIX) 40 MG tablet Take 40 mg by mouth daily. 10/04/20  Yes [provider]   Potassium Chloride ER 20 MEQ TBCR Take 20 mEq by mouth 2 (two) times daily as needed. 09/06/22  Yes [provider]  prazosin (MINIPRESS) 5 MG capsule Take 5 mg by mouth at bedtime. 05/30/23  Yes [provider]  QUEtiapine (SEROQUEL) 25 MG tablet Take 1 tablet (25 mg total) by mouth 2 (two) times daily. 05/22/22 08/14/23 Yes Amin, Ankit C, MD  QUEtiapine (SEROQUEL) 300 MG tablet Take 1 tablet (300 mg total) by mouth at bedtime. 05/22/22 08/14/23 Yes Amin, Ankit C, MD  sucralfate (CARAFATE) 1 g tablet Take 1 g by mouth in the morning, at noon, and at bedtime. 03/03/22  Yes [provider]  topiramate (TOPAMAX) 100 MG tablet Take 100 mg by mouth at bedtime. 07/16/22  Yes [provider]  zolpidem (AMBIEN) 10 MG tablet Take 10 mg by mouth at bedtime as needed. 07/12/23  Yes [provider]  acetaminophen (TYLENOL) 325 MG tablet Take 650 mg by mouth every 6 (six) hours as needed for mild pain or headache. Patient not taking: Reported on 07/25/2023    [provider]  cephALEXin (KEFLEX) 500 MG capsule 2 caps po bid x 7 days 08/19/22   Arthor Captain, PA-C  cyclobenzaprine (FLEXERIL) 10 MG tablet Take 10 mg by mouth as needed. Patient not taking: Reported on 07/25/2023 09/09/22   [provider]  Elastic Bandages & Supports (MEDICAL COMPRESSION STOCKINGS) MISC 1 pair of compression stocking for patient use. Patient not  taking: Reported on 07/25/2023 06/12/21   Garlon Hatchet, PA-C  hydrOXYzine (VISTARIL) 50 MG capsule Take 50 mg by mouth daily as needed for anxiety. Patient not taking: Reported on 07/25/2023 09/24/20   [provider]  ibuprofen (ADVIL) 800 MG tablet Take 800 mg by mouth 4 (four) times daily as needed for headache or mild pain. Patient not taking: Reported on 07/25/2023 06/05/20   [provider]  loperamide (IMODIUM) 2 MG capsule Take 2 mg by mouth as needed. 07/11/16   [provider]  metoprolol  tartrate (LOPRESSOR) 100 MG tablet Take 1 tablet (100 mg total) by mouth once for 1 dose. PLEASE TAKE METOPROLOL 2  HOURS PRIOR TO CTA SCAN. 07/25/23 07/25/23  Croitoru, Mihai, MD  naloxone [facility]) nasal spray 4 mg/0.1 mL Place 1 spray into the nose once. Patient not taking: Reported on 07/25/2023 09/06/22   [provider]  nicotine (NICODERM CQ - DOSED IN MG/24 HOURS) 21 mg/24hr patch Place 1 patch (21 mg total) onto the skin daily. Patient not taking: Reported on 07/25/2023 04/05/21   Carlyn Reichert, MD  oxyCODONE-acetaminophen (PERCOCET) 10-325 MG tablet Take 1 tablet by mouth every 4 (four) hours as needed for pain.    [provider]  oxyCODONE-acetaminophen (PERCOCET) 7.5-325 MG tablet Take 1 tablet by mouth every 8 (eight) hours as needed for up to 12 doses for severe pain. 08/19/22   Terald Sleeper, MD  prazosin (MINIPRESS) 2 MG capsule Take 1 capsule (2 mg total) by mouth at bedtime. 05/22/22 06/21/22  Amin, Ankit C, MD  promethazine (PHENERGAN) 12.5 MG tablet Take 1 tablet (12.5 mg total) by mouth every 6 (six) hours as needed for nausea or vomiting (not relieved by zofran). Patient not taking: Reported on 07/25/2023 05/22/22   Miguel Rota, MD  rosuvastatin (CRESTOR) 20 MG tablet Take 1 tablet (20 mg total) by mouth daily. 07/25/23   Croitoru, Mihai, MD  sertraline (ZOLOFT) 50 MG tablet Take 50 mg by mouth every morning. 07/04/23   [provider]  traZODone (DESYREL) 50 MG tablet Take 50 mg by mouth at bedtime as needed. Patient not taking: Reported on 07/25/2023 06/06/23   [provider]      Allergies    Codeine and Prozac [fluoxetine]    Review of Systems   Review of Systems  HENT:  Positive for sore throat.   Respiratory:  Positive for cough and shortness of breath.   Gastrointestinal:  Positive for diarrhea, nausea and vomiting.  All other systems reviewed and are negative.   Physical Exam Updated Vital Signs BP 119/76   Pulse 80    Temp 97.7 F (36.5 C) (Oral)   Resp 16   Ht 5' (1.524 m)   Wt 74.4 kg   LMP 06/14/2012   SpO2 99%   BMI 32.03 kg/m  Physical Exam Vitals and nursing note reviewed.  Constitutional:      Appearance: She is ill-appearing.  HENT:     Head: Normocephalic and atraumatic.     Right Ear: External ear normal.     Left Ear: External ear normal.     Mouth/Throat:     Mouth: Mucous membranes are moist.     Pharynx: Oropharynx is clear. No oropharyngeal exudate or posterior oropharyngeal erythema.  Eyes:     General: No scleral icterus.       Right eye: No discharge.        Left eye: No discharge.     Extraocular Movements:  Extraocular movements intact.     Conjunctiva/sclera: Conjunctivae normal.  Cardiovascular:     Rate and Rhythm: Normal rate and regular rhythm.     Pulses: Normal pulses.     Heart sounds: Normal heart sounds. No murmur heard.    No friction rub. No gallop.  Pulmonary:     Effort: Pulmonary effort is normal. No respiratory distress.     Breath sounds: Rales (Bilateral rales heard in lower lobes) present.  Abdominal:     General: Abdomen is flat. There is no distension.     Palpations: Abdomen is soft. There is no mass.     Tenderness: There is no guarding or rebound.  Musculoskeletal:        General: Tenderness (Patient expresses generalized tenderness upon palpating any aspect of her body.) present. No deformity or signs of injury.  Lymphadenopathy:     Cervical: Cervical adenopathy (Anterior cervical lymph nodes felt on palpation) present.  Skin:    General: Skin is warm and dry.     Coloration: Skin is not jaundiced or pale.     Findings: No erythema, lesion or rash.  Neurological:     General: No focal deficit present.     Mental Status: She is alert. Mental status is at baseline.  Psychiatric:        Mood and Affect: Mood normal.     ED Results / Procedures / Treatments   Labs (all labs ordered are listed, but only abnormal results are  displayed) Labs Reviewed  COMPREHENSIVE METABOLIC PANEL - Abnormal; Notable for the following components:      Result Value   Glucose, Bld 102 (*)    Creatinine, Ser 1.04 (*)    Calcium 8.5 (*)    All other components within normal limits  RESP PANEL BY RT-PCR (RSV, FLU A&B, COVID)  [id number]  CBC WITH DIFFERENTIAL/PLATELET    EKG None  Radiology DG Chest 2 View Result Date: 08/14/2023 CLINICAL DATA:  Cough with shortness of breath. EXAM: CHEST - 2 VIEW COMPARISON:  Chest radiograph dated June 09, 2023. FINDINGS: The heart size and mediastinal contours are within normal limits. Mild right basilar atelectasis. No focal consolidation, pleural effusion, or pneumothorax. No acute osseous abnormality. IMPRESSION: Mild right basilar atelectasis. Otherwise, no acute cardiopulmonary findings. Electronically Signed   By: Hart Robinsons M.D.   On: 08/14/2023 11:37    Procedures Procedures    Medications Ordered in ED Medications  promethazine (PHENERGAN) tablet 25 mg (25 mg Oral Given 08/14/23 1146)  albuterol (PROVENTIL) (2.5 MG/3ML) 0.083% nebulizer solution 2.5 mg (2.5 mg Nebulization Given 08/14/23 1156)  ipratropium-albuterol (DUONEB) 0.5-2.5 (3) MG/3ML nebulizer solution 3 mL (3 mLs Nebulization Given 08/14/23 1156)    ED Course/ Medical Decision Making/ A&P                                 Medical Decision Making Amount and/or Complexity of Data Reviewed Labs: ordered. Radiology: ordered.  Risk Prescription drug management.   This patient is a 52 year old female who presents to the ED for concern of cough, congestion, chills.   Differential diagnoses prior to evaluation: The emergent differential diagnosis includes, but is not limited to, URI, pneumonia, pulmonary embolism. This is not an exhaustive differential.   Past Medical History / Co-morbidities / Social History: B12 deficiency, diverticulosis, bipolar, multi illicit drug use  Additional history: Chart  reviewed. Pertinent results include: Has used multiple illicit drugs  over the course of her life.  Lab Tests/Imaging studies: I personally interpreted labs/imaging and the pertinent results include: CMP shows mildly elevated creatinine most likely due to dehydration. Respiratory panel negative CBC shows no sign of anemia or systemic infection  Chest x-ray shows atelectasis with no other acute cardiopulmonary etiology. I agree with the radiologist interpretation.    Medications: I ordered medication including DuoNebs and albuterol for wheezing.  I have reviewed the patients home medicines and have made adjustments as needed.  ED Course:  Patient is 52 year old female presents to the ED for cough, chills, vomiting, nausea, malaise.  This is been ongoing for 7 days.  Previous history of multi illicit drug use, bipolar, IBS, diverticulosis, vitamin B12 deficiency.  On exam patient appeared ill, coughing, wheezing, rales noted bilaterally.  Chest x-ray showed atelectasis with no acute cardiopulmonary etiology.  Creatinine was slightly elevated however was most likely due to dehydration as patient had been vomiting.  However due to patient's presentation, breathing treatment was provided due to hearing bilateral rales in lower lobes.  Phenergan was given for nausea and doxycycline was provided outpatient for possible pneumonia.  Patient also endorses sore throat from coughing thus viscous lidocaine was prescribed for pain.  Upon reevaluation patient's lungs continued with rales however wheezing had decreased.  Provided strict return to ER precautions.  Patient vital stable at this time with sats of 99% on room air.  Encouraged continued treatment with Tylenol/ibuprofen.  Patient expressed understanding and agreement with this plan.  Patient appears stable to be discharged at this time.   Disposition: After consideration of the diagnostic results and the patients response to treatment, I feel that patient  benefit from discharge and treatment noted as above.   emergency department workup does not suggest an emergent condition requiring admission or immediate intervention beyond what has been performed at this time. The plan is: Doxycycline for possible pneumonia, this is lidocaine for sore throat, Tylenol and ibuprofen for malaise, follow-up with PCP if symptoms do not improve, return to the ER symptoms worsen. The patient is safe for discharge and has been instructed to return immediately for worsening symptoms, change in symptoms or any other concerns.   Final Clinical Impression(s) / ED Diagnoses Final diagnoses:  Acute cough    Rx / DC Orders ED Discharge Orders          Ordered    doxycycline (VIBRAMYCIN) 100 MG capsule  2 times daily        08/14/23 1329    lidocaine (XYLOCAINE) 2 % solution  As needed        08/14/23 1337          Portions of this report may have been transcribed using voice recognition software. Every effort was made to ensure accuracy; however, inadvertent computerized transcription errors may be present.     Lunette Stands, [place] 08/14/23 1411    Derwood Kaplan, MD 08/15/23 1002

## 2023-08-21 ENCOUNTER — Encounter (HOSPITAL_COMMUNITY): Payer: Self-pay

## 2023-08-22 ENCOUNTER — Telehealth (HOSPITAL_COMMUNITY): Payer: Self-pay | Admitting: *Deleted

## 2023-08-22 NOTE — Telephone Encounter (Signed)
Attempted to call patient regarding upcoming cardiac CT appointment. °Left message on voicemail with name and callback number ° °Edie Vallandingham RN Navigator Cardiac Imaging °[facility] °[phone] Office °[phone] Cell ° °

## 2023-08-24 ENCOUNTER — Ambulatory Visit (HOSPITAL_COMMUNITY)
Admission: RE | Admit: 2023-08-24 | Discharge: 2023-08-24 | Disposition: A | Discharge status: Home / Self Care | Payer: 59 | Source: Ambulatory Visit | Attending: Cardiovascular Disease | Admitting: Cardiovascular Disease

## 2023-08-24 DIAGNOSIS — R072 Precordial pain: Secondary | ICD-10-CM | POA: Diagnosis present

## 2023-08-24 MED ORDER — IOHEXOL 350 MG/ML SOLN
95.0000 mL | Freq: Once | INTRAVENOUS | Status: AC | PRN
  Administered 2023-08-24: 95 mL via INTRAVENOUS

## 2023-08-24 MED ORDER — NITROGLYCERIN 0.4 MG SL SUBL
0.8000 mg | SUBLINGUAL_TABLET | Freq: Once | SUBLINGUAL | Status: AC
  Administered 2023-08-24: 0.8 mg via SUBLINGUAL

## 2023-08-24 MED ORDER — NITROGLYCERIN 0.4 MG SL SUBL
SUBLINGUAL_TABLET | SUBLINGUAL | Status: AC
  Filled 2023-08-24: qty 2

## 2023-08-24 MED ORDER — METOPROLOL TARTRATE 5 MG/5ML IV SOLN
10.0000 mg | Freq: Once | INTRAVENOUS | Status: AC | PRN
  Administered 2023-08-24: 5 mg via INTRAVENOUS

## 2023-08-24 MED ORDER — DILTIAZEM HCL 25 MG/5ML IV SOLN: 10.0000 mg | INTRAVENOUS | Status: DC | PRN

## 2023-08-24 MED ORDER — METOPROLOL TARTRATE 5 MG/5ML IV SOLN
INTRAVENOUS | Status: AC
  Filled 2023-08-24: qty 10

## 2023-08-24 NOTE — Progress Notes (Signed)
[name], female   DOB: June 09, 1971, 52 y.o.   MRN: [id number]  Pt sat up on side of bed vss. Interventional radiology PA has been called Pt states she is feeling better still complaining of substernal chest pressure

## 2023-08-24 NOTE — Plan of Care (Signed)
Outpatient undergoing non-emergent CTA chest in CT1. Nearing the end of the exam patient reported substernal chest pain and difficulty breathing IR PA asked to evaluate.  Patient seen in CT1, she is sitting up comfortably conversing with RN. Slightly hypotensive, normal HR, SpO2 98%. She denies difficulty breathing when sitting up, she states that she has chest infection which is currently being treated with antibiotics and she has the same dyspnea and chest pain at home when she lays flat. She has been sitting up to sleep at home due to the infection and trouble breathing. When sitting up she does not have any trouble breathing, chest pain remains the same as it has been without worsening today. She denies any nausea, dizziness, itching, rashes, wheezing. Chest pain does not radiate to arm or neck.  Patient alert, oriented x 3 Able to talk easily in full sentences RRR CTAB, no stridor Capillary refill < 2 seconds No rashes/hives  Assessment: - Patient with dyspnea upon laying flat supine on CT table which resolved with sitting up and is the same pattern that she has been experiencing at home. VSS, PE reassuring.  Plan: - Ok to return home today - Patient encouraged to contact PCP or provider prescribing antibiotics to ensure infection is clearing given ongoing dyspnea with laying flat - ED return precautions reviewed, patient is comfortable with this plan  Michelle Caffey, PA-C

## 2023-08-24 NOTE — Progress Notes (Signed)
[name], female   DOB: [birth year], 52 y.o.   MRN: [id number] ct heart done pt complaining difficulty breating substernal chest pressure feeling like she's drowning "its my congestion" pt is coughing.

## 2023-08-24 NOTE — Progress Notes (Signed)
[name], female   DOB: 1971/03/16, 52 y.o.   MRN: [id number] pt seen by radiology pa Lynnette Caffey ok to discharge home

## 2023-09-01 ENCOUNTER — Ambulatory Visit: Payer: 59

## 2023-09-05 ENCOUNTER — Ambulatory Visit: Payer: 59 | Admitting: Cardiology

## 2023-09-15 ENCOUNTER — Ambulatory Visit: Payer: 59

## 2023-09-19 NOTE — Progress Notes (Deleted)
   Cardiology Office Note    Date:  09/19/2023  ID:  [name], DOB [birth year], MRN [id number] PCP:  Salli Real, MD  Cardiologist:  None  Electrophysiologist:  None   Chief Complaint: ***  History of Present Illness: .    [name] is a 53 y.o. female with visit-pertinent history of ***  Labwork independently reviewed:   ROS: .    Please see the history of present illness. Otherwise, review of systems is positive for ***.  All other systems are reviewed and otherwise negative.  Studies Reviewed: [name]    EKG:  EKG is ordered today, personally reviewed, demonstrating ***  CV Studies: Cardiac studies reviewed are outlined and summarized above. Otherwise please see EMR for full report.   Current Reported Medications:.    No outpatient medications have been marked as taking for the 09/21/23 encounter (Appointment) with Rip Harbour, NP.    Physical Exam:    VS:  LMP 06/14/2012    Wt Readings from Last 3 Encounters:  08/14/23 164 lb (74.4 kg)  07/25/23 176 lb 6.4 oz (80 kg)  08/19/22 169 lb (76.7 kg)    GEN: Well nourished, well developed in no acute distress NECK: No JVD; No carotid bruits CARDIAC: ***RRR, no murmurs, rubs, gallops RESPIRATORY:  Clear to auscultation without rales, wheezing or rhonchi  ABDOMEN: Soft, non-tender, non-distended EXTREMITIES:  No edema; No acute deformity   Asessement and Plan:.     ***     Disposition: F/u with ***  Signed, Rip Harbour, NP

## 2023-09-21 ENCOUNTER — Ambulatory Visit: Payer: 59 | Admitting: Cardiology

## 2023-10-05 ENCOUNTER — Ambulatory Visit: Payer: 59

## 2023-10-08 NOTE — Progress Notes (Deleted)
 Cardiology Office Note    Date:  10/08/2023  ID:  [name], DOB [birth year], MRN [id number] PCP:  Salli Real, MD  Cardiologist:  None  Electrophysiologist:  None   Chief Complaint: ***  History of Present Illness: .    [name] is a 53 y.o. female with visit-pertinent history of reported atrial fibrillation, stage III lymphedema and a strong family history of heart disease  First evaluated by Dr. Royann Shivers on 07/25/2023 at the request of her PCP.  Patient reported experiencing chest pain described as a heavy feeling, as if something was sitting on her chest making it hard for her to breathe.  Discomfort could occur at any time, even waking up in the middle the night.  She also reported increased sleepiness, despite previously being insomniac, she is waking every 2 hours during the night.  She had previously undergone sleep study that showed an apnea index of problem certainly 25/h and was prescribed CPAP.  She also reported experiencing swelling in both legs with the left being more severely affected.  There was reported that the left leg was very swollen and warm to the touch and she had been experiencing fever and chills for about a week.  Patient has history of cellulitis and had previously required antibiotics for treatment.  Patient was also reported to be a long-term smoker, smoking a pack a day for approximately 37 years.  Echocardiogram in October 2023 indicated an LVEF of 60 to 65%, normal diastolic function, normal right atrial pressure, no serious valvular abnormalities.  BNP was normal at 17.  It was recommended that patient undergo coronary CTA given chest pain, prediabetes, history of smoking.  She was also started on Keflex for possibility of cellulitis.  Coronary CTA on 08/24/2023 indicated a coronary calcium score of 0, this was a percentile for age and sex matched control, mild 25 to 49% stenosis in mid LAD, proximal D1 and proximal D2, minimal 0 to 24% stenosis in  the proximal LAD.  Patient with mild nonobstructive CAD.  Today she presents for follow-up.  She reports that she   Labwork independently reviewed:   ROS: .   *** denies chest pain, shortness of breath, lower extremity edema, fatigue, palpitations, melena, hematuria, hemoptysis, diaphoresis, weakness, presyncope, syncope, orthopnea, and PND.  All other systems are reviewed and otherwise negative.  Studies Reviewed: [name]    EKG:  EKG is ordered today, personally reviewed, demonstrating ***     CV Studies:  Cardiac Studies & Procedures      ECHOCARDIOGRAM  ECHOCARDIOGRAM COMPLETE 05/18/2022  Narrative ECHOCARDIOGRAM REPORT    Patient Name:   [name] Date of Exam: 05/18/2022 Medical Rec #:  [id number]        Height:       60.0 in Accession #:    [id number]       Weight:       185.0 lb Date of Birth:  11/16/1970       BSA:          1.806 m Patient Age:    50 years         BP:           122/67 mmHg Patient Gender: F                HR:           90 bpm. Exam Location:  Inpatient  Procedure: 2D Echo, Cardiac Doppler and Color Doppler  Indications:  Syncope  History:        Patient has no prior history of Echocardiogram examinations.  Sonographer:    Gaynell Face Referring Phys: [id number] ALLISON WOLFE  IMPRESSIONS   1. Left ventricular ejection fraction, by estimation, is 60 to 65%. The left ventricle has normal function. The left ventricle has no regional wall motion abnormalities. Left ventricular diastolic parameters were normal. 2. Right ventricular systolic function is normal. The right ventricular size is normal. 3. The mitral valve is grossly normal. No evidence of mitral valve regurgitation. No evidence of mitral stenosis. 4. The aortic valve is grossly normal. Aortic valve regurgitation is not visualized. No aortic stenosis is present. 5. The inferior vena cava is normal in size with greater than 50% respiratory variability, suggesting right atrial pressure  of 3 mmHg.  Comparison(s): No prior Echocardiogram.  Conclusion(s)/Recommendation(s): Normal biventricular function without evidence of hemodynamically significant valvular heart disease.  FINDINGS Left Ventricle: Left ventricular ejection fraction, by estimation, is 60 to 65%. The left ventricle has normal function. The left ventricle has no regional wall motion abnormalities. The left ventricular internal cavity size was normal in size. There is no left ventricular hypertrophy. Left ventricular diastolic parameters were normal.  Right Ventricle: The right ventricular size is normal. Right vetricular wall thickness was not well visualized. Right ventricular systolic function is normal.  Left Atrium: Left atrial size was normal in size.  Right Atrium: Right atrial size was normal in size.  Pericardium: Trivial pericardial effusion is present.  Mitral Valve: The mitral valve is grossly normal. No evidence of mitral valve regurgitation. No evidence of mitral valve stenosis.  Tricuspid Valve: The tricuspid valve is grossly normal. Tricuspid valve regurgitation is not demonstrated. No evidence of tricuspid stenosis.  Aortic Valve: The aortic valve is grossly normal. Aortic valve regurgitation is not visualized. No aortic stenosis is present. Aortic valve mean gradient measures 3.0 mmHg. Aortic valve peak gradient measures 6.4 mmHg. Aortic valve area, by VTI measures 1.80 cm.  Pulmonic Valve: The pulmonic valve was not well visualized. Pulmonic valve regurgitation is not visualized. No evidence of pulmonic stenosis.  Aorta: The aortic root, ascending aorta and aortic arch are all structurally normal, with no evidence of dilitation or obstruction.  Venous: The inferior vena cava is normal in size with greater than 50% respiratory variability, suggesting right atrial pressure of 3 mmHg.  IAS/Shunts: The interatrial septum appears to be lipomatous. The interatrial septum was not well  visualized.   LEFT VENTRICLE PLAX 2D LVIDd:         4.10 cm   Diastology LVIDs:         2.90 cm   LV e' medial:    7.07 cm/s LV PW:         1.00 cm   LV E/e' medial:  10.5 LV IVS:        1.00 cm   LV e' lateral:   7.72 cm/s LVOT diam:     2.00 cm   LV E/e' lateral: 9.6 LV SV:         37 LV SV Index:   21 LVOT Area:     3.14 cm   RIGHT VENTRICLE TAPSE (M-mode): 1.8 cm  LEFT ATRIUM             Index        RIGHT ATRIUM           Index LA diam:        2.40 cm 1.33 cm/m  RA Area:     11.00 cm LA Vol (A2C):   37.3 ml 20.66 ml/m  RA Volume:   23.80 ml  13.18 ml/m LA Vol (A4C):   35.4 ml 19.60 ml/m LA Biplane Vol: 37.7 ml 20.88 ml/m AORTIC VALVE AV Area (Vmax):    1.90 cm AV Area (Vmean):   1.79 cm AV Area (VTI):     1.80 cm AV Vmax:           126.00 cm/s AV Vmean:          85.500 cm/s AV VTI:            0.206 m AV Peak Grad:      6.4 mmHg AV Mean Grad:      3.0 mmHg LVOT Vmax:         76.40 cm/s LVOT Vmean:        48.700 cm/s LVOT VTI:          0.118 m LVOT/AV VTI ratio: 0.57  AORTA Ao Root diam: 3.20 cm Ao Asc diam:  2.40 cm  MITRAL VALVE MV Area (PHT): 4.21 cm    SHUNTS MV Decel Time: 180 msec    Systemic VTI:  0.12 m MV E velocity: 74.40 cm/s  Systemic Diam: 2.00 cm MV A velocity: 87.90 cm/s MV E/A ratio:  0.85  [name] MD Electronically signed by [name] MD Signature Date/Time: 05/18/2022/3:36:18 PM    Final    CT SCANS  CT CORONARY MORPH W/CTA COR W/SCORE 08/24/2023  Addendum 09/07/2023  5:58 PM ADDENDUM REPORT: 09/07/2023 17:56  EXAM: OVER-READ INTERPRETATION  CT CHEST  The following report is an over-read performed by radiologist Dr. Alcide Clever of [facility] on 09/07/2023. This over-read does not include interpretation of cardiac or coronary anatomy or pathology. The coronary calcium score/coronary CTA interpretation by the cardiologist is attached.  COMPARISON:   None.  FINDINGS: Cardiovascular: There are no significant extracardiac vascular findings.  Mediastinum/Nodes: There are no enlarged lymph nodes within the visualized mediastinum.  Lungs/Pleura: Mild mucous plugging is noted in the left lower lobe. No focal confluent infiltrate is seen. The visualized lungs appear clear.  Upper abdomen: No significant findings in the visualized upper abdomen.  Musculoskeletal/Chest wall: No chest wall mass or suspicious osseous findings within the visualized chest.  IMPRESSION: Left lower lobe mucous plugging.  No focal consolidation is noted.   Electronically Signed By: Alcide Clever M.D. On: 09/07/2023 17:56  Narrative CLINICAL DATA:  12F with chest pain  EXAM: Cardiac/Coronary CTA  TECHNIQUE: The patient was scanned on a [employer].  FINDINGS: A 100 kV prospective scan was triggered in the descending thoracic aorta at 111 HU's. Axial non-contrast 3 mm slices were carried out through the heart. The data set was analyzed on a dedicated work station and scored using the Agatson method. Gantry rotation speed was 250 msecs and collimation was .6 mm. No beta blockade and 0.8 mg of sl NTG was given. The 3D data set was reconstructed in 5% intervals of the 35-75% of the R-R cycle. Phases were analyzed on a dedicated work station using MPR, MIP and VRT modes. The patient received 100 cc of contrast.  Coronary Arteries:  Normal coronary origin.  Right dominance.  RCA is a large dominant artery that gives rise to PDA and PLA. There is no plaque.  Left main is a large artery that gives rise to LAD and LCX arteries.  LAD is a large vessel. Noncalcified plaque in  proximal LAD causes 0-24% stenosis. Mixed plaque in mid LAD causes 25-49% stenosis. Mixed plaque in proximal D1 branch causes 25-49% stenosis. Mixed plaque in proximal D2 branch causes 25-49% stenosis  LCX is a small non-dominant artery.  There is no plaque.  Other  findings:  Left Ventricle: Normal size  Left Atrium: Normal size  Pulmonary Veins: Normal configuration  Right Ventricle: Normal size  Right Atrium: Normal size  Cardiac valves: No calcifications  Thoracic aorta: Normal size  Pulmonary Arteries: Normal size  Systemic Veins: Normal drainage  Pericardium: Normal thickness  IMPRESSION: 1. Coronary calcium score of 1. This was 80th percentile for age and sex matched control.  2. Total plaque volume 57mm3 which is 68th percentile for age and sex-matched controls (calcified plaque 26mm3; noncalcified plaque 68mm3). TPV is mild  3.  Normal coronary origin with right dominance.  4.  Nonobstructive CAD  5.  Mild (25-49%) stenosis in mid LAD, proximal D1, and proximal D2  6.  Minimal (0-24%) stenosis in proximal LAD  CAD-RADS 2. Mild non-obstructive CAD (25-49%). Consider non-atherosclerotic causes of chest pain. Consider preventive therapy and risk factor modification.  Electronically Signed: By: Epifanio Lesches M.D. On: 08/25/2023 12:37            Current Reported Medications:.    No outpatient medications have been marked as taking for the 10/09/23 encounter (Appointment) with Rip Harbour, NP.    Physical Exam:    VS:  LMP 06/14/2012    Wt Readings from Last 3 Encounters:  08/14/23 164 lb (74.4 kg)  07/25/23 176 lb 6.4 oz (80 kg)  08/19/22 169 lb (76.7 kg)    GEN: Well nourished, well developed in no acute distress NECK: No JVD; No carotid bruits CARDIAC: ***RRR, no murmurs, rubs, gallops RESPIRATORY:  Clear to auscultation without rales, wheezing or rhonchi  ABDOMEN: Soft, non-tender, non-distended EXTREMITIES:  No edema; No acute deformity   Asessement and Plan:.     ***     Disposition: F/u with ***  Signed, Rip Harbour, NP

## 2023-10-09 ENCOUNTER — Ambulatory Visit: Payer: 59 | Admitting: Cardiology

## 2023-10-09 DIAGNOSIS — I4719 Other supraventricular tachycardia: Secondary | ICD-10-CM

## 2023-10-09 DIAGNOSIS — I890 Lymphedema, not elsewhere classified: Secondary | ICD-10-CM

## 2023-10-09 DIAGNOSIS — J42 Unspecified chronic bronchitis: Secondary | ICD-10-CM

## 2023-10-09 DIAGNOSIS — G4733 Obstructive sleep apnea (adult) (pediatric): Secondary | ICD-10-CM

## 2023-10-09 DIAGNOSIS — E7800 Pure hypercholesterolemia, unspecified: Secondary | ICD-10-CM

## 2023-10-09 DIAGNOSIS — I2510 Atherosclerotic heart disease of native coronary artery without angina pectoris: Secondary | ICD-10-CM

## 2023-10-11 ENCOUNTER — Encounter: Payer: 59 | Admitting: Internal Medicine

## 2023-10-26 ENCOUNTER — Ambulatory Visit: Payer: 59 | Admitting: Physician Assistant

## 2023-11-13 ENCOUNTER — Encounter: Payer: 59 | Admitting: Internal Medicine

## 2023-11-21 NOTE — Progress Notes (Deleted)
 Cardiology Office Note    Date:  11/21/2023  ID:  [name], DOB 09/12/1970, MRN [id number] PCP:  Salli Real, MD  Cardiologist:  None  Electrophysiologist:  None   Chief Complaint: ***  History of Present Illness: .   [name] is a 53 y.o. female with visit-pertinent history of reported atrial fibrillation, stage III lymphedema and a strong family history of heart disease  First evaluated by Dr. Royann Shivers on 07/25/2023 at the request of her PCP.  Patient reported experiencing chest pain described as a heavy feeling, as if something was sitting on her chest making it hard for her to breathe.  Discomfort could occur at any time, even waking up in the middle the night.  She also reported increased sleepiness, despite previously being insomniac, she is waking every 2 hours during the night.  She had previously undergone sleep study that showed an apnea index of problem certainly 25/h and was prescribed CPAP.  She also reported experiencing swelling in both legs with the left being more severely affected.  There was reported that the left leg was very swollen and warm to the touch and she had been experiencing fever and chills for about a week.  Patient has history of cellulitis and had previously required antibiotics for treatment.  Patient was also reported to be a long-term smoker, smoking a pack a day for approximately 37 years.  Echocardiogram in October 2023 indicated an LVEF of 60 to 65%, normal diastolic function, normal right atrial pressure, no serious valvular abnormalities.  BNP was normal at 17.  It was recommended that patient undergo coronary CTA given chest pain, prediabetes, history of smoking.  She was also started on Keflex for possibility of cellulitis.  Coronary CTA on 08/24/2023 indicated a coronary calcium score of 0, this was a percentile for age and sex matched control, mild 25 to 49% stenosis in mid LAD, proximal D1 and proximal D2, minimal 0 to 24% stenosis in the  proximal LAD.  Patient with mild nonobstructive CAD.  Today she presents for follow-up.  She reports that she  Chest pain: Coronary CTA on 08/24/2023 indicated a coronary calcium score of 0, this was a percentile for age and sex matched control, mild 25 to 49% stenosis in mid LAD, proximal D1 and proximal D2, minimal 0 to 24% stenosis in the proximal LAD.  Patient with mild nonobstructive CAD. Today she reports  Hyperlipidemia: Last lipid profile on 07/25/2023 indicated total cholesterol 239, HDL 57, triglycerides 139 and LDL 157.  Patient was started on rosuvastatin 20 mg daily.  To have repeat lipid profile in March.  Hx of atrial flibrillation: Per patient diagnosed with history of atrial fibrillation in 2023.  Per Dr. Erin Hearing notes only documentation was of transient atrial tachycardia.  Check 2-week ZIO monitor  Lymphedema: Patient with history of lymphedema and cellulitis. Followed by?  OSA: Patient previously diagnosed with obstructive sleep apnea with sleep study.  She is currently  Chronic bronchitis: Patient with history of wheezing, nighttime congestion and productive cough with thick and sticky sputum. Patient currently smokes  Labwork independently reviewed:   ROS: .   *** denies chest pain, shortness of breath, lower extremity edema, fatigue, palpitations, melena, hematuria, hemoptysis, diaphoresis, weakness, presyncope, syncope, orthopnea, and PND.  All other systems are reviewed and otherwise negative.  Studies Reviewed: [name]    EKG:  EKG is ordered today, personally reviewed, demonstrating ***     CV Studies: Cardiac studies reviewed are outlined and  summarized above. Otherwise please see EMR for full report. Cardiac Studies & Procedures   ______________________________________________________________________________________________     ECHOCARDIOGRAM  ECHOCARDIOGRAM COMPLETE 05/18/2022  Narrative ECHOCARDIOGRAM REPORT    Patient Name:   [name]  Date of Exam: 05/18/2022 Medical Rec #:  [id number]        Height:       60.0 in Accession #:    [id number]       Weight:       185.0 lb Date of Birth:  [birth year]       BSA:          1.806 m Patient Age:    50 years         BP:           122/67 mmHg Patient Gender: F                HR:           90 bpm. Exam Location:  Inpatient  Procedure: 2D Echo, Cardiac Doppler and Color Doppler  Indications:    Syncope  History:        Patient has no prior history of Echocardiogram examinations.  Sonographer:    Gaynell Face Referring Phys: [id number] ALLISON WOLFE  IMPRESSIONS   1. Left ventricular ejection fraction, by estimation, is 60 to 65%. The left ventricle has normal function. The left ventricle has no regional wall motion abnormalities. Left ventricular diastolic parameters were normal. 2. Right ventricular systolic function is normal. The right ventricular size is normal. 3. The mitral valve is grossly normal. No evidence of mitral valve regurgitation. No evidence of mitral stenosis. 4. The aortic valve is grossly normal. Aortic valve regurgitation is not visualized. No aortic stenosis is present. 5. The inferior vena cava is normal in size with greater than 50% respiratory variability, suggesting right atrial pressure of 3 mmHg.  Comparison(s): No prior Echocardiogram.  Conclusion(s)/Recommendation(s): Normal biventricular function without evidence of hemodynamically significant valvular heart disease.  FINDINGS Left Ventricle: Left ventricular ejection fraction, by estimation, is 60 to 65%. The left ventricle has normal function. The left ventricle has no regional wall motion abnormalities. The left ventricular internal cavity size was normal in size. There is no left ventricular hypertrophy. Left ventricular diastolic parameters were normal.  Right Ventricle: The right ventricular size is normal. Right vetricular wall thickness was not well visualized. Right ventricular systolic  function is normal.  Left Atrium: Left atrial size was normal in size.  Right Atrium: Right atrial size was normal in size.  Pericardium: Trivial pericardial effusion is present.  Mitral Valve: The mitral valve is grossly normal. No evidence of mitral valve regurgitation. No evidence of mitral valve stenosis.  Tricuspid Valve: The tricuspid valve is grossly normal. Tricuspid valve regurgitation is not demonstrated. No evidence of tricuspid stenosis.  Aortic Valve: The aortic valve is grossly normal. Aortic valve regurgitation is not visualized. No aortic stenosis is present. Aortic valve mean gradient measures 3.0 mmHg. Aortic valve peak gradient measures 6.4 mmHg. Aortic valve area, by VTI measures 1.80 cm.  Pulmonic Valve: The pulmonic valve was not well visualized. Pulmonic valve regurgitation is not visualized. No evidence of pulmonic stenosis.  Aorta: The aortic root, ascending aorta and aortic arch are all structurally normal, with no evidence of dilitation or obstruction.  Venous: The inferior vena cava is normal in size with greater than 50% respiratory variability, suggesting right atrial pressure of 3 mmHg.  IAS/Shunts: The interatrial septum appears to be lipomatous.  The interatrial septum was not well visualized.   LEFT VENTRICLE PLAX 2D LVIDd:         4.10 cm   Diastology LVIDs:         2.90 cm   LV e' medial:    7.07 cm/s LV PW:         1.00 cm   LV E/e' medial:  10.5 LV IVS:        1.00 cm   LV e' lateral:   7.72 cm/s LVOT diam:     2.00 cm   LV E/e' lateral: 9.6 LV SV:         37 LV SV Index:   21 LVOT Area:     3.14 cm   RIGHT VENTRICLE TAPSE (M-mode): 1.8 cm  LEFT ATRIUM             Index        RIGHT ATRIUM           Index LA diam:        2.40 cm 1.33 cm/m   RA Area:     11.00 cm LA Vol (A2C):   37.3 ml 20.66 ml/m  RA Volume:   23.80 ml  13.18 ml/m LA Vol (A4C):   35.4 ml 19.60 ml/m LA Biplane Vol: 37.7 ml 20.88 ml/m AORTIC VALVE AV Area (Vmax):     1.90 cm AV Area (Vmean):   1.79 cm AV Area (VTI):     1.80 cm AV Vmax:           126.00 cm/s AV Vmean:          85.500 cm/s AV VTI:            0.206 m AV Peak Grad:      6.4 mmHg AV Mean Grad:      3.0 mmHg LVOT Vmax:         76.40 cm/s LVOT Vmean:        48.700 cm/s LVOT VTI:          0.118 m LVOT/AV VTI ratio: 0.57  AORTA Ao Root diam: 3.20 cm Ao Asc diam:  2.40 cm  MITRAL VALVE MV Area (PHT): 4.21 cm    SHUNTS MV Decel Time: 180 msec    Systemic VTI:  0.12 m MV E velocity: 74.40 cm/s  Systemic Diam: 2.00 cm MV A velocity: 87.90 cm/s MV E/A ratio:  0.85  [name] MD Electronically signed by [name] MD Signature Date/Time: 05/18/2022/3:36:18 PM    Final      CT SCANS  CT CORONARY MORPH W/CTA COR W/SCORE 08/24/2023  Addendum 09/07/2023  5:58 PM ADDENDUM REPORT: 09/07/2023 17:56  EXAM: OVER-READ INTERPRETATION  CT CHEST  The following report is an over-read performed by radiologist Dr. Alcide Clever of [facility] on 09/07/2023. This over-read does not include interpretation of cardiac or coronary anatomy or pathology. The coronary calcium score/coronary CTA interpretation by the cardiologist is attached.  COMPARISON:  None.  FINDINGS: Cardiovascular: There are no significant extracardiac vascular findings.  Mediastinum/Nodes: There are no enlarged lymph nodes within the visualized mediastinum.  Lungs/Pleura: Mild mucous plugging is noted in the left lower lobe. No focal confluent infiltrate is seen. The visualized lungs appear clear.  Upper abdomen: No significant findings in the visualized upper abdomen.  Musculoskeletal/Chest wall: No chest wall mass or suspicious osseous findings within the visualized chest.  IMPRESSION: Left lower lobe mucous plugging.  No focal consolidation is noted.   Electronically Signed  By: Alcide Clever M.D. On: 09/07/2023 17:56  Narrative CLINICAL DATA:  84F with chest  pain  EXAM: Cardiac/Coronary CTA  TECHNIQUE: The patient was scanned on a [employer].  FINDINGS: A 100 kV prospective scan was triggered in the descending thoracic aorta at 111 HU's. Axial non-contrast 3 mm slices were carried out through the heart. The data set was analyzed on a dedicated work station and scored using the Agatson method. Gantry rotation speed was 250 msecs and collimation was .6 mm. No beta blockade and 0.8 mg of sl NTG was given. The 3D data set was reconstructed in 5% intervals of the 35-75% of the R-R cycle. Phases were analyzed on a dedicated work station using MPR, MIP and VRT modes. The patient received 100 cc of contrast.  Coronary Arteries:  Normal coronary origin.  Right dominance.  RCA is a large dominant artery that gives rise to PDA and PLA. There is no plaque.  Left main is a large artery that gives rise to LAD and LCX arteries.  LAD is a large vessel. Noncalcified plaque in proximal LAD causes 0-24% stenosis. Mixed plaque in mid LAD causes 25-49% stenosis. Mixed plaque in proximal D1 branch causes 25-49% stenosis. Mixed plaque in proximal D2 branch causes 25-49% stenosis  LCX is a small non-dominant artery.  There is no plaque.  Other findings:  Left Ventricle: Normal size  Left Atrium: Normal size  Pulmonary Veins: Normal configuration  Right Ventricle: Normal size  Right Atrium: Normal size  Cardiac valves: No calcifications  Thoracic aorta: Normal size  Pulmonary Arteries: Normal size  Systemic Veins: Normal drainage  Pericardium: Normal thickness  IMPRESSION: 1. Coronary calcium score of 1. This was 80th percentile for age and sex matched control.  2. Total plaque volume 66mm3 which is 68th percentile for age and sex-matched controls (calcified plaque 63mm3; noncalcified plaque 49mm3). TPV is mild  3.  Normal coronary origin with right dominance.  4.  Nonobstructive CAD  5.  Mild (25-49%) stenosis in mid  LAD, proximal D1, and proximal D2  6.  Minimal (0-24%) stenosis in proximal LAD  CAD-RADS 2. Mild non-obstructive CAD (25-49%). Consider non-atherosclerotic causes of chest pain. Consider preventive therapy and risk factor modification.  Electronically Signed: By: Epifanio Lesches M.D. On: 08/25/2023 12:37     ______________________________________________________________________________________________       Current Reported Medications:.    No outpatient medications have been marked as taking for the 11/23/23 encounter (Appointment) with Rip Harbour, NP.    Physical Exam:    VS:  LMP 06/14/2012    Wt Readings from Last 3 Encounters:  08/14/23 164 lb (74.4 kg)  07/25/23 176 lb 6.4 oz (80 kg)  08/19/22 169 lb (76.7 kg)    GEN: Well nourished, well developed in no acute distress NECK: No JVD; No carotid bruits CARDIAC: ***RRR, no murmurs, rubs, gallops RESPIRATORY:  Clear to auscultation without rales, wheezing or rhonchi  ABDOMEN: Soft, non-tender, non-distended EXTREMITIES:  No edema; No acute deformity     Asessement and Plan:.     ***     Disposition: F/u with ***  Signed, Rip Harbour, NP

## 2023-11-23 ENCOUNTER — Ambulatory Visit: Payer: 59 | Admitting: Cardiology

## 2023-12-03 NOTE — Progress Notes (Deleted)
 Cardiology Office Note    Date:  12/03/2023  ID:  [name], DOB Jul 09, 1971, MRN [id number] PCP:  Salli Real, MD  Cardiologist:  None  Electrophysiologist:  None   Chief Complaint: ***  History of Present Illness: .   [name] is a 53 y.o. female with visit-pertinent history of reported atrial fibrillation, stage III lymphedema and a strong family history of heart disease  First evaluated by Dr. Royann Shivers on 07/25/2023 at the request of her PCP.  Patient reported experiencing chest pain described as a heavy feeling, as if something was sitting on her chest making it hard for her to breathe.  Discomfort could occur at any time, even waking up in the middle the night.  She also reported increased sleepiness, despite previously being insomniac, she is waking every 2 hours during the night.  She had previously undergone sleep study that showed an apnea index of problem certainly 25/h and was prescribed CPAP.  She also reported experiencing swelling in both legs with the left being more severely affected.  There was reported that the left leg was very swollen and warm to the touch and she had been experiencing fever and chills for about a week.  Patient has history of cellulitis and had previously required antibiotics for treatment.  Patient was also reported to be a long-term smoker, smoking a pack a day for approximately 37 years.  Echocardiogram in October 2023 indicated an LVEF of 60 to 65%, normal diastolic function, normal right atrial pressure, no serious valvular abnormalities.  BNP was normal at 17.  It was recommended that patient undergo coronary CTA given chest pain, prediabetes, history of smoking.  She was also started on Keflex for possibility of cellulitis.  Coronary CTA on 08/24/2023 indicated a coronary calcium score of 0, this was a percentile for age and sex matched control, mild 25 to 49% stenosis in mid LAD, proximal D1 and proximal D2, minimal 0 to 24% stenosis in the  proximal LAD.  Patient with mild nonobstructive CAD.  Today she presents for follow-up.  She reports that she  Chest pain: Coronary CTA on 08/24/2023 indicated a coronary calcium score of 0, this was a percentile for age and sex matched control, mild 25 to 49% stenosis in mid LAD, proximal D1 and proximal D2, minimal 0 to 24% stenosis in the proximal LAD.  Patient with mild nonobstructive CAD. Today she reports  Hyperlipidemia: Last lipid profile on 07/25/2023 indicated total cholesterol 239, HDL 57, triglycerides 139 and LDL 157.  Patient was started on rosuvastatin 20 mg daily.  To have repeat lipid profile in March.  Hx of atrial flibrillation: Per patient diagnosed with history of atrial fibrillation in 2023.  Per Dr. Erin Hearing notes only documentation was of transient atrial tachycardia.  Check 2-week ZIO monitor  Lymphedema: Patient with history of lymphedema and cellulitis. Followed by?  OSA: Patient previously diagnosed with obstructive sleep apnea with sleep study.  She is currently  Chronic bronchitis: Patient with history of wheezing, nighttime congestion and productive cough with thick and sticky sputum. Patient currently smokes  Labwork independently reviewed:   ROS: .   *** denies chest pain, shortness of breath, lower extremity edema, fatigue, palpitations, melena, hematuria, hemoptysis, diaphoresis, weakness, presyncope, syncope, orthopnea, and PND.  All other systems are reviewed and otherwise negative.  Studies Reviewed: [name]    EKG:  EKG is ordered today, personally reviewed, demonstrating ***     CV Studies: Cardiac studies reviewed are outlined and  summarized above. Otherwise please see EMR for full report. Cardiac Studies & Procedures   ______________________________________________________________________________________________     ECHOCARDIOGRAM  ECHOCARDIOGRAM COMPLETE 05/18/2022  Narrative ECHOCARDIOGRAM REPORT    Patient Name:   [name]  Date of Exam: 05/18/2022 Medical Rec #:  [id number]        Height:       60.0 in Accession #:    [id number]       Weight:       185.0 lb Date of Birth:  [birth year]       BSA:          1.806 m Patient Age:    50 years         BP:           122/67 mmHg Patient Gender: F                HR:           90 bpm. Exam Location:  Inpatient  Procedure: 2D Echo, Cardiac Doppler and Color Doppler  Indications:    Syncope  History:        Patient has no prior history of Echocardiogram examinations.  Sonographer:    Gaynell Face Referring Phys: [id number] ALLISON WOLFE  IMPRESSIONS   1. Left ventricular ejection fraction, by estimation, is 60 to 65%. The left ventricle has normal function. The left ventricle has no regional wall motion abnormalities. Left ventricular diastolic parameters were normal. 2. Right ventricular systolic function is normal. The right ventricular size is normal. 3. The mitral valve is grossly normal. No evidence of mitral valve regurgitation. No evidence of mitral stenosis. 4. The aortic valve is grossly normal. Aortic valve regurgitation is not visualized. No aortic stenosis is present. 5. The inferior vena cava is normal in size with greater than 50% respiratory variability, suggesting right atrial pressure of 3 mmHg.  Comparison(s): No prior Echocardiogram.  Conclusion(s)/Recommendation(s): Normal biventricular function without evidence of hemodynamically significant valvular heart disease.  FINDINGS Left Ventricle: Left ventricular ejection fraction, by estimation, is 60 to 65%. The left ventricle has normal function. The left ventricle has no regional wall motion abnormalities. The left ventricular internal cavity size was normal in size. There is no left ventricular hypertrophy. Left ventricular diastolic parameters were normal.  Right Ventricle: The right ventricular size is normal. Right vetricular wall thickness was not well visualized. Right ventricular systolic  function is normal.  Left Atrium: Left atrial size was normal in size.  Right Atrium: Right atrial size was normal in size.  Pericardium: Trivial pericardial effusion is present.  Mitral Valve: The mitral valve is grossly normal. No evidence of mitral valve regurgitation. No evidence of mitral valve stenosis.  Tricuspid Valve: The tricuspid valve is grossly normal. Tricuspid valve regurgitation is not demonstrated. No evidence of tricuspid stenosis.  Aortic Valve: The aortic valve is grossly normal. Aortic valve regurgitation is not visualized. No aortic stenosis is present. Aortic valve mean gradient measures 3.0 mmHg. Aortic valve peak gradient measures 6.4 mmHg. Aortic valve area, by VTI measures 1.80 cm.  Pulmonic Valve: The pulmonic valve was not well visualized. Pulmonic valve regurgitation is not visualized. No evidence of pulmonic stenosis.  Aorta: The aortic root, ascending aorta and aortic arch are all structurally normal, with no evidence of dilitation or obstruction.  Venous: The inferior vena cava is normal in size with greater than 50% respiratory variability, suggesting right atrial pressure of 3 mmHg.  IAS/Shunts: The interatrial septum appears to be lipomatous.  The interatrial septum was not well visualized.   LEFT VENTRICLE PLAX 2D LVIDd:         4.10 cm   Diastology LVIDs:         2.90 cm   LV e' medial:    7.07 cm/s LV PW:         1.00 cm   LV E/e' medial:  10.5 LV IVS:        1.00 cm   LV e' lateral:   7.72 cm/s LVOT diam:     2.00 cm   LV E/e' lateral: 9.6 LV SV:         37 LV SV Index:   21 LVOT Area:     3.14 cm   RIGHT VENTRICLE TAPSE (M-mode): 1.8 cm  LEFT ATRIUM             Index        RIGHT ATRIUM           Index LA diam:        2.40 cm 1.33 cm/m   RA Area:     11.00 cm LA Vol (A2C):   37.3 ml 20.66 ml/m  RA Volume:   23.80 ml  13.18 ml/m LA Vol (A4C):   35.4 ml 19.60 ml/m LA Biplane Vol: 37.7 ml 20.88 ml/m AORTIC VALVE AV Area (Vmax):     1.90 cm AV Area (Vmean):   1.79 cm AV Area (VTI):     1.80 cm AV Vmax:           126.00 cm/s AV Vmean:          85.500 cm/s AV VTI:            0.206 m AV Peak Grad:      6.4 mmHg AV Mean Grad:      3.0 mmHg LVOT Vmax:         76.40 cm/s LVOT Vmean:        48.700 cm/s LVOT VTI:          0.118 m LVOT/AV VTI ratio: 0.57  AORTA Ao Root diam: 3.20 cm Ao Asc diam:  2.40 cm  MITRAL VALVE MV Area (PHT): 4.21 cm    SHUNTS MV Decel Time: 180 msec    Systemic VTI:  0.12 m MV E velocity: 74.40 cm/s  Systemic Diam: 2.00 cm MV A velocity: 87.90 cm/s MV E/A ratio:  0.85  [name] MD Electronically signed by [name] MD Signature Date/Time: 05/18/2022/3:36:18 PM    Final      CT SCANS  CT CORONARY MORPH W/CTA COR W/SCORE 08/24/2023  Addendum 09/07/2023  5:58 PM ADDENDUM REPORT: 09/07/2023 17:56  EXAM: OVER-READ INTERPRETATION  CT CHEST  The following report is an over-read performed by radiologist Dr. Alcide Clever of [facility] on 09/07/2023. This over-read does not include interpretation of cardiac or coronary anatomy or pathology. The coronary calcium score/coronary CTA interpretation by the cardiologist is attached.  COMPARISON:  None.  FINDINGS: Cardiovascular: There are no significant extracardiac vascular findings.  Mediastinum/Nodes: There are no enlarged lymph nodes within the visualized mediastinum.  Lungs/Pleura: Mild mucous plugging is noted in the left lower lobe. No focal confluent infiltrate is seen. The visualized lungs appear clear.  Upper abdomen: No significant findings in the visualized upper abdomen.  Musculoskeletal/Chest wall: No chest wall mass or suspicious osseous findings within the visualized chest.  IMPRESSION: Left lower lobe mucous plugging.  No focal consolidation is noted.   Electronically Signed  By: Alcide Clever M.D. On: 09/07/2023 17:56  Narrative CLINICAL DATA:  34F with chest  pain  EXAM: Cardiac/Coronary CTA  TECHNIQUE: The patient was scanned on a [employer].  FINDINGS: A 100 kV prospective scan was triggered in the descending thoracic aorta at 111 HU's. Axial non-contrast 3 mm slices were carried out through the heart. The data set was analyzed on a dedicated work station and scored using the Agatson method. Gantry rotation speed was 250 msecs and collimation was .6 mm. No beta blockade and 0.8 mg of sl NTG was given. The 3D data set was reconstructed in 5% intervals of the 35-75% of the R-R cycle. Phases were analyzed on a dedicated work station using MPR, MIP and VRT modes. The patient received 100 cc of contrast.  Coronary Arteries:  Normal coronary origin.  Right dominance.  RCA is a large dominant artery that gives rise to PDA and PLA. There is no plaque.  Left main is a large artery that gives rise to LAD and LCX arteries.  LAD is a large vessel. Noncalcified plaque in proximal LAD causes 0-24% stenosis. Mixed plaque in mid LAD causes 25-49% stenosis. Mixed plaque in proximal D1 branch causes 25-49% stenosis. Mixed plaque in proximal D2 branch causes 25-49% stenosis  LCX is a small non-dominant artery.  There is no plaque.  Other findings:  Left Ventricle: Normal size  Left Atrium: Normal size  Pulmonary Veins: Normal configuration  Right Ventricle: Normal size  Right Atrium: Normal size  Cardiac valves: No calcifications  Thoracic aorta: Normal size  Pulmonary Arteries: Normal size  Systemic Veins: Normal drainage  Pericardium: Normal thickness  IMPRESSION: 1. Coronary calcium score of 1. This was 80th percentile for age and sex matched control.  2. Total plaque volume 28mm3 which is 68th percentile for age and sex-matched controls (calcified plaque 79mm3; noncalcified plaque 59mm3). TPV is mild  3.  Normal coronary origin with right dominance.  4.  Nonobstructive CAD  5.  Mild (25-49%) stenosis in mid  LAD, proximal D1, and proximal D2  6.  Minimal (0-24%) stenosis in proximal LAD  CAD-RADS 2. Mild non-obstructive CAD (25-49%). Consider non-atherosclerotic causes of chest pain. Consider preventive therapy and risk factor modification.  Electronically Signed: By: Epifanio Lesches M.D. On: 08/25/2023 12:37     ______________________________________________________________________________________________       Current Reported Medications:.    No outpatient medications have been marked as taking for the 12/04/23 encounter (Appointment) with Rip Harbour, NP.    Physical Exam:    VS:  LMP 06/14/2012    Wt Readings from Last 3 Encounters:  08/14/23 164 lb (74.4 kg)  07/25/23 176 lb 6.4 oz (80 kg)  08/19/22 169 lb (76.7 kg)    GEN: Well nourished, well developed in no acute distress NECK: No JVD; No carotid bruits CARDIAC: ***RRR, no murmurs, rubs, gallops RESPIRATORY:  Clear to auscultation without rales, wheezing or rhonchi  ABDOMEN: Soft, non-tender, non-distended EXTREMITIES:  No edema; No acute deformity     Asessement and Plan:.     ***     Disposition: F/u with ***  Signed, Rip Harbour, NP

## 2023-12-04 ENCOUNTER — Ambulatory Visit: Admitting: Cardiology

## 2023-12-17 NOTE — Progress Notes (Signed)
 Cardiology Office Note    Date:  12/18/2023  ID:  [name], DOB 1971-03-13, MRN [id number] PCP:  Narda Bacon, MD  Cardiologist:  Luana Rumple, MD  Electrophysiologist:  None   Chief Complaint: Follow up for palpitations  History of Present Illness: .   [name] is a 53 y.o. female with visit-pertinent history of reported atrial fibrillation, stage III lymphedema and a strong family history of heart disease  First evaluated by Dr. Alvis Ba on 07/25/2023 at the request of her PCP.  Patient reported experiencing chest pain described as a heavy feeling, as if something was sitting on her chest making it hard for her to breathe.  Discomfort could occur at any time, even waking up in the middle the night.  She also reported increased sleepiness, despite previously being insomniac, she was waking every 2 hours during the night.  She had previously undergone sleep study that showed an apnea index of problem certainly 25/h and was prescribed CPAP.  She also reported experiencing swelling in both legs with the left being more severely affected.  There was reported that the left leg was very swollen and warm to the touch and she had been experiencing fever and chills for about a week.  Patient has history of cellulitis and had previously required antibiotics for treatment.  Patient was also reported to be a long-term smoker, smoking a pack a day for approximately 37 years.  Echocardiogram in October 2023 indicated an LVEF of 60 to 65%, normal diastolic function, normal right atrial pressure, no serious valvular abnormalities.  BNP was normal at 17.  It was recommended that patient undergo coronary CTA given chest pain, prediabetes, history of smoking.  She was also started on Keflex  for possibility of cellulitis.  Coronary CTA on 08/24/2023 indicated a coronary calcium  score of 1, this was a percentile for age and sex matched control, mild 25 to 49% stenosis in mid LAD, proximal D1 and  proximal D2, minimal 0 to 24% stenosis in the proximal LAD.  Patient with mild nonobstructive CAD.  Today she presents for follow-up.  She reports that she has been doing okay overall.  She reports that she is no longer taking rosuvastatin  as she had muscle pain and increased achiness after starting on the medication, after discontinuation her symptoms improved.  Patient notes she continues to have occasional chest discomfort when she has palpitations, she does question if this is related to her panic type attacks as this is when her palpitations occur.  Patient notes that she has a very long history of panic attacks since she was a teenager.  ROS: .   Today she denies chest pain, shortness of breath, lower extremity edema, fatigue, palpitations, melena, hematuria, hemoptysis, diaphoresis, weakness, presyncope, syncope, orthopnea, and PND.  All other systems are reviewed and otherwise negative. Studies Reviewed: Aaron Aas   EKG:  EKG is not ordered today.  CV Studies: Cardiac studies reviewed are outlined and summarized above. Otherwise please see EMR for full report. Cardiac Studies & Procedures   ______________________________________________________________________________________________     ECHOCARDIOGRAM  ECHOCARDIOGRAM COMPLETE 05/18/2022  Narrative ECHOCARDIOGRAM REPORT    Patient Name:   LORALIE MALTA Date of Exam: 05/18/2022 Medical Rec #:  [id number]        Height:       60.0 in Accession #:    [id number]       Weight:       185.0 lb Date of Birth:  02-22-1971  BSA:          1.806 m Patient Age:    50 years         BP:           122/67 mmHg Patient Gender: F                HR:           90 bpm. Exam Location:  Inpatient  Procedure: 2D Echo, Cardiac Doppler and Color Doppler  Indications:    Syncope  History:        Patient has no prior history of Echocardiogram examinations.  Sonographer:    Paige Boatman Referring Phys: [id number] ALLISON WOLFE  IMPRESSIONS   1.  Left ventricular ejection fraction, by estimation, is 60 to 65%. The left ventricle has normal function. The left ventricle has no regional wall motion abnormalities. Left ventricular diastolic parameters were normal. 2. Right ventricular systolic function is normal. The right ventricular size is normal. 3. The mitral valve is grossly normal. No evidence of mitral valve regurgitation. No evidence of mitral stenosis. 4. The aortic valve is grossly normal. Aortic valve regurgitation is not visualized. No aortic stenosis is present. 5. The inferior vena cava is normal in size with greater than 50% respiratory variability, suggesting right atrial pressure of 3 mmHg.  Comparison(s): No prior Echocardiogram.  Conclusion(s)/Recommendation(s): Normal biventricular function without evidence of hemodynamically significant valvular heart disease.  FINDINGS Left Ventricle: Left ventricular ejection fraction, by estimation, is 60 to 65%. The left ventricle has normal function. The left ventricle has no regional wall motion abnormalities. The left ventricular internal cavity size was normal in size. There is no left ventricular hypertrophy. Left ventricular diastolic parameters were normal.  Right Ventricle: The right ventricular size is normal. Right vetricular wall thickness was not well visualized. Right ventricular systolic function is normal.  Left Atrium: Left atrial size was normal in size.  Right Atrium: Right atrial size was normal in size.  Pericardium: Trivial pericardial effusion is present.  Mitral Valve: The mitral valve is grossly normal. No evidence of mitral valve regurgitation. No evidence of mitral valve stenosis.  Tricuspid Valve: The tricuspid valve is grossly normal. Tricuspid valve regurgitation is not demonstrated. No evidence of tricuspid stenosis.  Aortic Valve: The aortic valve is grossly normal. Aortic valve regurgitation is not visualized. No aortic stenosis is present.  Aortic valve mean gradient measures 3.0 mmHg. Aortic valve peak gradient measures 6.4 mmHg. Aortic valve area, by VTI measures 1.80 cm.  Pulmonic Valve: The pulmonic valve was not well visualized. Pulmonic valve regurgitation is not visualized. No evidence of pulmonic stenosis.  Aorta: The aortic root, ascending aorta and aortic arch are all structurally normal, with no evidence of dilitation or obstruction.  Venous: The inferior vena cava is normal in size with greater than 50% respiratory variability, suggesting right atrial pressure of 3 mmHg.  IAS/Shunts: The interatrial septum appears to be lipomatous. The interatrial septum was not well visualized.   LEFT VENTRICLE PLAX 2D LVIDd:         4.10 cm   Diastology LVIDs:         2.90 cm   LV e' medial:    7.07 cm/s LV PW:         1.00 cm   LV E/e' medial:  10.5 LV IVS:        1.00 cm   LV e' lateral:   7.72 cm/s LVOT diam:     2.00 cm  LV E/e' lateral: 9.6 LV SV:         37 LV SV Index:   21 LVOT Area:     3.14 cm   RIGHT VENTRICLE TAPSE (M-mode): 1.8 cm  LEFT ATRIUM             Index        RIGHT ATRIUM           Index LA diam:        2.40 cm 1.33 cm/m   RA Area:     11.00 cm LA Vol (A2C):   37.3 ml 20.66 ml/m  RA Volume:   23.80 ml  13.18 ml/m LA Vol (A4C):   35.4 ml 19.60 ml/m LA Biplane Vol: 37.7 ml 20.88 ml/m AORTIC VALVE AV Area (Vmax):    1.90 cm AV Area (Vmean):   1.79 cm AV Area (VTI):     1.80 cm AV Vmax:           126.00 cm/s AV Vmean:          85.500 cm/s AV VTI:            0.206 m AV Peak Grad:      6.4 mmHg AV Mean Grad:      3.0 mmHg LVOT Vmax:         76.40 cm/s LVOT Vmean:        48.700 cm/s LVOT VTI:          0.118 m LVOT/AV VTI ratio: 0.57  AORTA Ao Root diam: 3.20 cm Ao Asc diam:  2.40 cm  MITRAL VALVE MV Area (PHT): 4.21 cm    SHUNTS MV Decel Time: 180 msec    Systemic VTI:  0.12 m MV E velocity: 74.40 cm/s  Systemic Diam: 2.00 cm MV A velocity: 87.90 cm/s MV E/A ratio:   0.85  Sheryle Donning MD Electronically signed by Sheryle Donning MD Signature Date/Time: 05/18/2022/3:36:18 PM    Final      CT SCANS  CT CORONARY MORPH W/CTA COR W/SCORE 08/24/2023  Addendum 09/07/2023  5:58 PM ADDENDUM REPORT: 09/07/2023 17:56  EXAM: OVER-READ INTERPRETATION  CT CHEST  The following report is an over-read performed by radiologist Dr. Violeta Grey of [facility] on 09/07/2023. This over-read does not include interpretation of cardiac or coronary anatomy or pathology. The coronary calcium  score/coronary CTA interpretation by the cardiologist is attached.  COMPARISON:  None.  FINDINGS: Cardiovascular: There are no significant extracardiac vascular findings.  Mediastinum/Nodes: There are no enlarged lymph nodes within the visualized mediastinum.  Lungs/Pleura: Mild mucous plugging is noted in the left lower lobe. No focal confluent infiltrate is seen. The visualized lungs appear clear.  Upper abdomen: No significant findings in the visualized upper abdomen.  Musculoskeletal/Chest wall: No chest wall mass or suspicious osseous findings within the visualized chest.  IMPRESSION: Left lower lobe mucous plugging.  No focal consolidation is noted.   Electronically Signed By: Violeta Grey M.D. On: 09/07/2023 17:56  Narrative CLINICAL DATA:  24F with chest pain  EXAM: Cardiac/Coronary CTA  TECHNIQUE: The patient was scanned on a [employer].  FINDINGS: A 100 kV prospective scan was triggered in the descending thoracic aorta at 111 HU's. Axial non-contrast 3 mm slices were carried out through the heart. The data set was analyzed on a dedicated work station and scored using the Agatson method. Gantry rotation speed was 250 msecs and collimation was .6 mm. No beta blockade and 0.8 mg of sl NTG was  given. The 3D data set was reconstructed in 5% intervals of the 35-75% of the R-R cycle. Phases were analyzed on  a dedicated work station using MPR, MIP and VRT modes. The patient received 100 cc of contrast.  Coronary Arteries:  Normal coronary origin.  Right dominance.  RCA is a large dominant artery that gives rise to PDA and PLA. There is no plaque.  Left main is a large artery that gives rise to LAD and LCX arteries.  LAD is a large vessel. Noncalcified plaque in proximal LAD causes 0-24% stenosis. Mixed plaque in mid LAD causes 25-49% stenosis. Mixed plaque in proximal D1 branch causes 25-49% stenosis. Mixed plaque in proximal D2 branch causes 25-49% stenosis  LCX is a small non-dominant artery.  There is no plaque.  Other findings:  Left Ventricle: Normal size  Left Atrium: Normal size  Pulmonary Veins: Normal configuration  Right Ventricle: Normal size  Right Atrium: Normal size  Cardiac valves: No calcifications  Thoracic aorta: Normal size  Pulmonary Arteries: Normal size  Systemic Veins: Normal drainage  Pericardium: Normal thickness  IMPRESSION: 1. Coronary calcium  score of 1. This was 80th percentile for age and sex matched control.  2. Total plaque volume 10mm3 which is 68th percentile for age and sex-matched controls (calcified plaque 28mm3; noncalcified plaque 44mm3). TPV is mild  3.  Normal coronary origin with right dominance.  4.  Nonobstructive CAD  5.  Mild (25-49%) stenosis in mid LAD, proximal D1, and proximal D2  6.  Minimal (0-24%) stenosis in proximal LAD  CAD-RADS 2. Mild non-obstructive CAD (25-49%). Consider non-atherosclerotic causes of chest pain. Consider preventive therapy and risk factor modification.  Electronically Signed: By: Carson Clara M.D. On: 08/25/2023 12:37     ______________________________________________________________________________________________       Current Reported Medications:.    Current Meds  Medication Sig   acetaminophen  (TYLENOL ) 325 MG tablet Take 650 mg by mouth every 6 (six) hours as  needed for mild pain (pain score 1-3) or headache.   atorvastatin  (LIPITOR) 10 MG tablet Take 1 tablet (10 mg total) by mouth daily.   clonazePAM  (KLONOPIN ) 1 MG tablet Take 1 mg by mouth 3 (three) times daily.   cyclobenzaprine  (FLEXERIL ) 10 MG tablet Take 10 mg by mouth as needed.   Elastic Bandages & Supports (MEDICAL COMPRESSION STOCKINGS) MISC 1 pair of compression stocking for patient use.   gabapentin  (NEURONTIN ) 600 MG tablet Take 600 mg by mouth 3 (three) times daily.   hydrOXYzine  (VISTARIL ) 50 MG capsule Take 50 mg by mouth daily as needed for anxiety.   ibuprofen  (ADVIL ) 800 MG tablet Take 800 mg by mouth 4 (four) times daily as needed for headache or mild pain (pain score 1-3).   lamoTRIgine (LAMICTAL) 25 MG tablet Take 25 mg by mouth 2 (two) times daily.   loperamide  (IMODIUM ) 2 MG capsule Take 2 mg by mouth as needed.   mirtazapine  (REMERON ) 15 MG tablet Take 1 tablet (15 mg total) by mouth at bedtime.   nicotine  (NICODERM CQ  - DOSED IN MG/24 HOURS) 21 mg/24hr patch Place 1 patch (21 mg total) onto the skin daily.   oxyCODONE -acetaminophen  (PERCOCET/ROXICET) 5-325 MG tablet Take 1 tablet by mouth every 6 (six) hours as needed for up to 6 doses for severe pain.   pantoprazole  (PROTONIX ) 40 MG tablet Take 40 mg by mouth daily.   prazosin  (MINIPRESS ) 5 MG capsule Take 5 mg by mouth at bedtime.   promethazine  (PHENERGAN ) 12.5 MG tablet Take 1  tablet (12.5 mg total) by mouth every 6 (six) hours as needed for nausea or vomiting (not relieved by zofran ).   sertraline (ZOLOFT) 50 MG tablet Take 50 mg by mouth every morning.   sucralfate  (CARAFATE ) 1 g tablet Take 1 g by mouth in the morning, at noon, and at bedtime.   topiramate  (TOPAMAX ) 100 MG tablet Take 100 mg by mouth at bedtime.   [DISCONTINUED] rosuvastatin  (CRESTOR ) 20 MG tablet Take 1 tablet (20 mg total) by mouth daily.    Physical Exam:    VS:  BP 120/70 (BP Location: Left Arm, Patient Position: Sitting, Cuff Size: Normal)    Pulse 96   Ht 5' (1.524 m)   Wt 166 lb (75.3 kg)   LMP 06/14/2012   SpO2 98%   BMI 32.42 kg/m    Wt Readings from Last 3 Encounters:  12/18/23 166 lb (75.3 kg)  08/14/23 164 lb (74.4 kg)  07/25/23 176 lb 6.4 oz (80 kg)    GEN: Well nourished, well developed in no acute distress NECK: No JVD; No carotid bruits CARDIAC: RRR, no murmurs, rubs, gallops RESPIRATORY:  Clear to auscultation without rales, wheezing or rhonchi  ABDOMEN: Soft, non-tender, non-distended EXTREMITIES:  Bilateral 2+ edema    Asessement and Plan:.    Chest pain: Coronary CTA on 08/24/2023 indicated a coronary calcium  score of 1, this was a percentile for age and sex matched control, mild 25 to 49% stenosis in mid LAD, proximal D1 and proximal D2, minimal 0 to 24% stenosis in the proximal LAD.  Patient with mild nonobstructive CAD. Today she reports a sense of chest discomfort during episodes of palpitations in setting of panic attacks.  Reviewed coronary CTA results which are overall reassuring.  Patient notes that she is no longer taking rosuvastatin  as noted below. Heart healthy diet and regular cardiovascular exercise encouraged.  Reviewed ED precautions.   Hyperlipidemia: Last lipid profile on 07/25/2023 indicated total cholesterol 239, HDL 57, triglycerides 139 and LDL 157.  Patient was started on rosuvastatin  20 mg daily, patient notes that she discontinued this a few weeks ago given increased muscle aches and pains.  Check fasting lipid profile and LFTs today.  Patient agreeable to trialing atorvastatin  10 mg daily.  Will recheck fasting lipid profile and LFTs in 2 months.  Hx of atrial flibrillation/palpitations: Per patient diagnosed with history of atrial fibrillation in 2023.  Per Dr. Leola Raisin notes only documentation was of transient atrial tachycardia.  Patient reports that she has a history of anxiety attacks and has problems with palpitations and chest discomfort during these attacks.  Will have patient  wear a 2-week cardiac monitor.   Lymphedema: Patient with history of lymphedema and cellulitis. Patient reports this is being monitored by her PCP, compression stockings and leg elevation encouraged.   OSA: Patient previously diagnosed with obstructive sleep apnea with sleep study.  She currently has a CPAP but has been unable to tolerate the mask and is looking for a replacement mask, recommended she follow up with provider.   Chronic bronchitis: Patient with history of wheezing, nighttime congestion and productive cough with thick and sticky sputum. Patient currently smokes 1/2 pack a day, complete cessation encouraged.     Disposition: F/u with Thia Olesen, NP in two months.   Signed, Ndeye Tenorio D Benney [place], NP

## 2023-12-18 ENCOUNTER — Ambulatory Visit

## 2023-12-18 ENCOUNTER — Other Ambulatory Visit: Payer: Self-pay | Admitting: Cardiology

## 2023-12-18 ENCOUNTER — Encounter: Payer: Self-pay | Admitting: Cardiology

## 2023-12-18 ENCOUNTER — Ambulatory Visit: Attending: Cardiology | Admitting: Cardiology

## 2023-12-18 VITALS — BP 120/70 | HR 72 | Ht 60.0 in | Wt 166.0 lb

## 2023-12-18 DIAGNOSIS — R002 Palpitations: Secondary | ICD-10-CM

## 2023-12-18 DIAGNOSIS — G4733 Obstructive sleep apnea (adult) (pediatric): Secondary | ICD-10-CM

## 2023-12-18 DIAGNOSIS — E7800 Pure hypercholesterolemia, unspecified: Secondary | ICD-10-CM

## 2023-12-18 DIAGNOSIS — I4719 Other supraventricular tachycardia: Secondary | ICD-10-CM

## 2023-12-18 DIAGNOSIS — R0789 Other chest pain: Secondary | ICD-10-CM

## 2023-12-18 DIAGNOSIS — J42 Unspecified chronic bronchitis: Secondary | ICD-10-CM

## 2023-12-18 DIAGNOSIS — R072 Precordial pain: Secondary | ICD-10-CM | POA: Diagnosis not present

## 2023-12-18 DIAGNOSIS — I890 Lymphedema, not elsewhere classified: Secondary | ICD-10-CM

## 2023-12-18 MED ORDER — ATORVASTATIN CALCIUM 10 MG PO TABS: 10.0000 mg | ORAL_TABLET | Freq: Every day | ORAL | 3 refills | Status: DC

## 2023-12-18 NOTE — Patient Instructions (Signed)
 Medication Instructions:  Stop Rosuvastatin  Start Atorvastatin  10 mg once a day  *If you need a refill on your cardiac medications before your next appointment, please call your pharmacy*  Lab Work: We are going to draw a fasting lipid panel and Cmet If you have labs (blood work) drawn today and your tests are completely normal, you will receive your results only by: MyChart Message (if you have MyChart) OR A paper copy in the mail If you have any lab test that is abnormal or we need to change your treatment, we will call you to review the results.  Testing/Procedures: Delane Fear- Long Term Monitor Instructions  Your physician has requested you wear a ZIO patch monitor for 14 days.  This is a single patch monitor. [employer] supplies one patch monitor per enrollment. Additional stickers are not available. Please do not apply patch if you will be having a Nuclear Stress Test,  Echocardiogram, Cardiac CT, MRI, or Chest Xray during the period you would be wearing the  monitor. The patch cannot be worn during these tests. You cannot remove and re-apply the  ZIO XT patch monitor.  Your ZIO patch monitor will be mailed 3 day USPS to your address on file. It may take 3-5 days  to receive your monitor after you have been enrolled.  Once you have received your monitor, please review the enclosed instructions. Your monitor  has already been registered assigning a specific monitor serial # to you.  Billing and Patient Assistance Program Information  We have supplied [employer] with any of your insurance information on file for billing purposes. [employer] offers a sliding scale Patient Assistance Program for patients that do not have  insurance, or whose insurance does not completely cover the cost of the ZIO monitor.  You must apply for the Patient Assistance Program to qualify for this discounted rate.  To apply, please call [employer] at [phone], select option 4, select option 2, ask to apply for   Patient Assistance Program. Sanna Crystal will ask your household income, and how many people  are in your household. They will quote your out-of-pocket cost based on that information.  [employer] will also be able to set up a 66-month, interest-free payment plan if needed.  Applying the monitor   Shave hair from upper left chest.  Hold abrader disc by orange tab. Rub abrader in 40 strokes over the upper left chest as  indicated in your monitor instructions.  Clean area with 4 enclosed alcohol pads. Let dry.  Apply patch as indicated in monitor instructions. Patch will be placed under collarbone on left  side of chest with arrow pointing upward.  Rub patch adhesive wings for 2 minutes. Remove white label marked "1". Remove the white  label marked "2". Rub patch adhesive wings for 2 additional minutes.  While looking in a mirror, press and release button in center of patch. A small green light will  flash 3-4 times. This will be your only indicator that the monitor has been turned on.  Do not shower for the first 24 hours. You may shower after the first 24 hours.  Press the button if you feel a symptom. You will hear a small click. Record Date, Time and  Symptom in the Patient Logbook.  When you are ready to remove the patch, follow instructions on the last 2 pages of Patient  Logbook. Stick patch monitor onto the last page of Patient Logbook.  Place Patient Logbook in the blue and white box. Use  locking tab on box and tape box closed  securely. The blue and white box has prepaid postage on it. Please place it in the mailbox as  soon as possible. Your physician should have your test results approximately 7 days after the  monitor has been mailed back to [facility].  Call [facility] Customer Care at [phone] if you have questions regarding  your ZIO XT patch monitor. Call them immediately if you see an orange light blinking on your  monitor.  If your monitor falls off in less than 4  days, contact our Monitor department at [phone].  If your monitor becomes loose or falls off after 4 days call [employer] at [phone] for  suggestions on securing your monitor   Follow-Up: At [facility], you and your health needs are our priority.  As part of our continuing mission to provide you with exceptional heart care, our providers are all part of one team.  This team includes your primary Cardiologist (physician) and Advanced Practice Providers or APPs (Physician Assistants and Nurse Practitioners) who all work together to provide you with the care you need, when you need it.  Your next appointment:   2 month(s)  Provider:   Katlyn West, NP   We recommend signing up for the patient portal called "MyChart".  Sign up information is provided on this After Visit Summary.  MyChart is used to connect with patients for Virtual Visits (Telemedicine).  Patients are able to view lab/test results, encounter notes, upcoming appointments, etc.  Non-urgent messages can be sent to your provider as well.   To learn more about what you can do with MyChart, go to [email or web address].   Other Instructions:   1st Floor: - Lobby - Registration  - Pharmacy  - Lab - Cafe  2nd Floor: - PV Lab - Diagnostic Testing (echo, CT, nuclear med)  3rd Floor: - Vacant  4th Floor: - TCTS (cardiothoracic surgery) - AFib Clinic - Structural Heart Clinic - Vascular Surgery  - Vascular Ultrasound  5th Floor: - HeartCare Cardiology (general and EP) - Clinical Pharmacy for coumadin, hypertension, lipid, weight-loss medications, and med management appointments    Valet parking services will be available as well.

## 2023-12-18 NOTE — Progress Notes (Unsigned)
 Enrolled for [employer] to mail a ZIO XT long term holter monitor to the patients address on file.   Dr. Royann Shivers to read.

## 2023-12-19 ENCOUNTER — Encounter: Payer: Self-pay | Admitting: Cardiology

## 2023-12-19 LAB — COMPREHENSIVE METABOLIC PANEL WITH GFR
ALT: 13 IU/L (ref 0–32)
AST: 15 IU/L (ref 0–40)
Albumin: 3.9 g/dL (ref 3.8–4.9)
Alkaline Phosphatase: 86 IU/L (ref 44–121)
BUN/Creatinine Ratio: 9 (ref 9–23)
BUN: 7 mg/dL (ref 6–24)
Bilirubin Total: <0.2 mg/dL (ref 0.0–1.2)
CO2: 22 mmol/L (ref 20–29)
Calcium: 8.9 mg/dL (ref 8.7–10.2)
Chloride: 109 mmol/L — ABNORMAL HIGH (ref 96–106)
Creatinine, Ser: 0.75 mg/dL (ref 0.57–1.00)
Globulin, Total: 2.3 g/dL (ref 1.5–4.5)
Glucose: 108 mg/dL — ABNORMAL HIGH (ref 70–99)
Potassium: 4 mmol/L (ref 3.5–5.2)
Sodium: 143 mmol/L (ref 134–144)
Total Protein: 6.2 g/dL (ref 6.0–8.5)
eGFR: 96 mL/min/{1.73_m2} (ref >59)

## 2023-12-19 LAB — LIPID PANEL
Chol/HDL Ratio: 3.5 ratio (ref 0.0–4.4)
Cholesterol, Total: 179 mg/dL (ref 100–199)
HDL: 51 mg/dL (ref >39)
LDL Chol Calc (NIH): 109 mg/dL — ABNORMAL HIGH (ref 0–99)
Triglycerides: 103 mg/dL (ref 0–149)
VLDL Cholesterol Cal: 19 mg/dL (ref 5–40)

## 2023-12-20 ENCOUNTER — Telehealth: Payer: Self-pay

## 2023-12-20 DIAGNOSIS — E7800 Pure hypercholesterolemia, unspecified: Secondary | ICD-10-CM

## 2023-12-20 NOTE — Telephone Encounter (Signed)
 Left message to call back

## 2023-12-20 NOTE — Telephone Encounter (Signed)
-----   Message from Katlyn D West sent at 12/20/2023 11:19 AM EDT ----- Please let [name] know that her kidney function is normal and her electrolytes are normal. Her liver function is normal. Her LDL or bad cholesterol is improved, she was switched from rosuvastatin  to atorvastatin  at office visit she will need repeat fasting lipid profile and LFTs in two months.

## 2023-12-21 NOTE — Telephone Encounter (Signed)
 Pt returning calling in regards to results. Please advise

## 2023-12-21 NOTE — Telephone Encounter (Signed)
 Spoke with patient and shared lab results.  Per Katlyn West, NP:  Please let [name] know that her kidney function is normal and her electrolytes are normal. Her liver function is normal. Her LDL or bad cholesterol is improved, she was switched from rosuvastatin  to atorvastatin  at office visit she will need repeat fasting lipid profile and LFTs in two months.    Lipid panel and LFTs ordered and released to [facility].

## 2023-12-25 ENCOUNTER — Telehealth: Payer: Self-pay

## 2023-12-25 NOTE — Telephone Encounter (Signed)
 Mailbox full

## 2023-12-25 NOTE — Telephone Encounter (Signed)
-----   Message from Katlyn D West sent at 12/20/2023 11:19 AM EDT ----- Please let Ms. Mcdonell know that her kidney function is normal and her electrolytes are normal. Her liver function is normal. Her LDL or bad cholesterol is improved, she was switched from rosuvastatin  to atorvastatin  at office visit she will need repeat fasting lipid profile and LFTs in two months.

## 2024-01-01 ENCOUNTER — Telehealth: Payer: Self-pay | Admitting: Cardiovascular Disease

## 2024-01-01 NOTE — Telephone Encounter (Signed)
 Mailbox full

## 2024-01-01 NOTE — Telephone Encounter (Signed)
 Pt c/o medication issue:  1. Name of Medication:   atorvastatin  (LIPITOR) 10 MG tablet    2. How are you currently taking this medication (dosage and times per day)?   Take 1 tablet (10 mg total) by mouth daily.    3. Are you having a reaction (difficulty breathing--STAT)? Had to be aware of her breathing, but was not scared   4. What is your medication issue? Patient stated that this medication has caused her to feel sick as if she had the flu. Patient stated she is no longer taking the medication. It caused her to have a rash on her scalp, arms, and face. Patient stated she felt like she needed to be aware of her breathing when taking the medication. Please advise.

## 2024-01-01 NOTE — Telephone Encounter (Signed)
 Patient returned RN's call.

## 2024-01-01 NOTE — Telephone Encounter (Signed)
 Pt advised and asks to wait to start anything more until she see [facility] NP 02/16/24.

## 2024-01-01 NOTE — Telephone Encounter (Signed)
 Spoke with patient and she states she broke out in a rash and every muscle in her body hurt. She has stopped taking the medications and would like to discuss other options

## 2024-01-01 NOTE — Telephone Encounter (Signed)
 I would stop the atorvastatin  for a minimum of 1 month and then try pravastatin 40 mg in the evening once daily, with a lipid profile in about 3 months after the change.

## 2024-01-12 NOTE — Telephone Encounter (Signed)
 Called patient advised of below they verbalized understanding.

## 2024-02-09 ENCOUNTER — Other Ambulatory Visit (HOSPITAL_COMMUNITY)

## 2024-02-12 ENCOUNTER — Other Ambulatory Visit (HOSPITAL_COMMUNITY)

## 2024-02-13 ENCOUNTER — Ambulatory Visit

## 2024-02-14 ENCOUNTER — Ambulatory Visit

## 2024-02-16 ENCOUNTER — Ambulatory Visit: Admitting: Cardiology

## 2024-02-20 ENCOUNTER — Other Ambulatory Visit (HOSPITAL_COMMUNITY)

## 2024-02-22 ENCOUNTER — Ambulatory Visit

## 2024-02-23 ENCOUNTER — Ambulatory Visit

## 2024-02-27 ENCOUNTER — Inpatient Hospital Stay (HOSPITAL_COMMUNITY): Admission: RE | Admit: 2024-02-27 | Source: Ambulatory Visit

## 2024-04-01 ENCOUNTER — Other Ambulatory Visit: Payer: 59

## 2024-04-02 ENCOUNTER — Other Ambulatory Visit: Payer: Self-pay

## 2024-04-02 ENCOUNTER — Encounter (HOSPITAL_COMMUNITY): Payer: Self-pay | Admitting: *Deleted

## 2024-04-02 ENCOUNTER — Emergency Department (HOSPITAL_COMMUNITY)
Admission: EM | Admit: 2024-04-02 | Discharge: 2024-04-02 | Disposition: A | Discharge status: Home / Self Care | Attending: Emergency Medicine | Admitting: Emergency Medicine

## 2024-04-02 DIAGNOSIS — F19939 Other psychoactive substance use, unspecified with withdrawal, unspecified: Secondary | ICD-10-CM

## 2024-04-02 DIAGNOSIS — F17200 Nicotine dependence, unspecified, uncomplicated: Secondary | ICD-10-CM | POA: Diagnosis not present

## 2024-04-02 DIAGNOSIS — F19130 Other psychoactive substance abuse with withdrawal, uncomplicated: Secondary | ICD-10-CM | POA: Diagnosis present

## 2024-04-02 DIAGNOSIS — Z85820 Personal history of malignant melanoma of skin: Secondary | ICD-10-CM | POA: Insufficient documentation

## 2024-04-02 LAB — CBC WITH DIFFERENTIAL/PLATELET
Abs Immature Granulocytes: 0.02 K/uL (ref 0.00–0.07)
Basophils Absolute: 0 K/uL (ref 0.0–0.1)
Basophils Relative: 1 %
Eosinophils Absolute: 0.1 K/uL (ref 0.0–0.5)
Eosinophils Relative: 1 %
HCT: 47 % — ABNORMAL HIGH (ref 36.0–46.0)
Hemoglobin: 15.3 g/dL — ABNORMAL HIGH (ref 12.0–15.0)
Immature Granulocytes: 0 %
Lymphocytes Relative: 17 %
Lymphs Abs: 1.5 K/uL (ref 0.7–4.0)
MCH: 31 pg (ref 26.0–34.0)
MCHC: 32.6 g/dL (ref 30.0–36.0)
MCV: 95.1 fL (ref 80.0–100.0)
Monocytes Absolute: 0.5 K/uL (ref 0.1–1.0)
Monocytes Relative: 6 %
Neutro Abs: 6.8 K/uL (ref 1.7–7.7)
Neutrophils Relative %: 75 %
Platelets: 298 K/uL (ref 150–400)
RBC: 4.94 MIL/uL (ref 3.87–5.11)
RDW: 12.8 % (ref 11.5–15.5)
WBC: 8.8 K/uL (ref 4.0–10.5)
nRBC: 0 % (ref 0.0–0.2)

## 2024-04-02 LAB — BASIC METABOLIC PANEL WITH GFR
Anion gap: 10 (ref 5–15)
BUN: 10 mg/dL (ref 6–20)
CO2: 25 mmol/L (ref 22–32)
Calcium: 9 mg/dL (ref 8.9–10.3)
Chloride: 106 mmol/L (ref 98–111)
Creatinine, Ser: 0.71 mg/dL (ref 0.44–1.00)
GFR, Estimated: >60 mL/min (ref >60)
Glucose, Bld: 119 mg/dL — ABNORMAL HIGH (ref 70–99)
Potassium: 3.1 mmol/L — ABNORMAL LOW (ref 3.5–5.1)
Sodium: 141 mmol/L (ref 135–145)

## 2024-04-02 LAB — TROPONIN I (HIGH SENSITIVITY): Troponin I (High Sensitivity): <2 ng/L (ref <18)

## 2024-04-02 MED ORDER — SODIUM CHLORIDE 0.9 % IV BOLUS
500.0000 mL | Freq: Once | INTRAVENOUS | Status: AC
  Administered 2024-04-02: 500 mL via INTRAVENOUS

## 2024-04-02 MED ORDER — MIRTAZAPINE 15 MG PO TABS: 15.0000 mg | ORAL_TABLET | Freq: Every day | ORAL | 0 refills | Status: AC

## 2024-04-02 MED ORDER — QUETIAPINE FUMARATE 25 MG PO TABS: 25.0000 mg | ORAL_TABLET | Freq: Two times a day (BID) | ORAL | 0 refills | Status: AC

## 2024-04-02 MED ORDER — QUETIAPINE FUMARATE 50 MG PO TABS
25.0000 mg | ORAL_TABLET | Freq: Every day | ORAL | Status: DC
  Administered 2024-04-02: 25 mg via ORAL
  Filled 2024-04-02: qty 1

## 2024-04-02 MED ORDER — PRAZOSIN HCL 2 MG PO CAPS: 2.0000 mg | ORAL_CAPSULE | Freq: Every day | ORAL | 0 refills | Status: AC

## 2024-04-02 MED ORDER — CLONAZEPAM 0.5 MG PO TABS
1.0000 mg | ORAL_TABLET | Freq: Once | ORAL | Status: AC
  Administered 2024-04-02: 1 mg via ORAL
  Filled 2024-04-02: qty 2

## 2024-04-02 MED ORDER — QUETIAPINE FUMARATE 300 MG PO TABS: 300.0000 mg | ORAL_TABLET | Freq: Every day | ORAL | 0 refills | Status: AC

## 2024-04-02 MED ORDER — PRAZOSIN HCL 5 MG PO CAPS: 5.0000 mg | ORAL_CAPSULE | Freq: Every day | ORAL | 0 refills | Status: AC

## 2024-04-02 NOTE — ED Provider Notes (Signed)
 Emergency Department Provider Note   I have reviewed the triage vital signs and the nursing notes.   HISTORY  Chief Complaint Withdrawal   HPI [name] is a 53 y.o. female presents to the ED with withdrawal symptoms after running out of multiple medications early. She has been on opiates, benzodiazepines, and seroquel . She has tried reaching out to both her pain mgmt team and psychiatry teams for assistance without success. She is feeling chest pressure, insomnia, nausea, and tingling in the extremities. No fever.    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   Jaundice    MRSA (methicillin resistant Staphylococcus aureus)    hospitalized for 48 hours in Dec 2016   Staph infection    in my blood    Review of Systems  Constitutional: No fever/chills Cardiovascular: Positive chest pain. Respiratory: Denies shortness of breath. Gastrointestinal: No abdominal pain. Positive nausea, no vomiting.  Skin: Negative for rash. Neurological: Negative for headaches, focal weakness or numbness.  ____________________________________________   PHYSICAL EXAM:  VITAL SIGNS: ED Triage Vitals  Encounter Vitals Group     BP 04/02/24 1209 129/76     Pulse Rate 04/02/24 1209 100     Resp 04/02/24 1209 16     Temp 04/02/24 1209 98.9 F (37.2 C)     Temp Source 04/02/24 1209 Oral     SpO2 04/02/24 1207 100 %     Weight 04/02/24 1213 166 lb  (75.3 kg)   Constitutional: Alert and oriented. Well appearing and in no acute distress. No appreciable tremors.  Eyes: Conjunctivae are normal.  Head: Atraumatic. Nose: No congestion/rhinnorhea. Mouth/Throat: Mucous membranes are moist.  Oropharynx non-erythematous. Neck: No stridor.   Cardiovascular: Normal rate, regular rhythm. Good peripheral circulation. Grossly normal heart sounds.   Respiratory: Normal respiratory effort.  No retractions. Lungs CTAB. Gastrointestinal: Soft and nontender. No distention.  Musculoskeletal: No lower extremity tenderness nor edema. No gross deformities of extremities. Neurologic:  Normal speech and language. No gross focal neurologic deficits are appreciated.  Skin:  Skin is warm, dry and intact. No rash noted.  ____________________________________________   LABS (all labs ordered are listed, but only abnormal results are displayed)  Labs Reviewed  BASIC METABOLIC PANEL WITH GFR - Abnormal; Notable for the following components:      Result Value   Potassium 3.1 (*)    Glucose, Bld 119 (*)    All other components within normal limits  CBC WITH DIFFERENTIAL/PLATELET - Abnormal; Notable for the following components:   Hemoglobin 15.3 (*)    HCT 47.0 (*)    All other components within normal limits  TROPONIN I (HIGH SENSITIVITY)   ____________________________________________  EKG   EKG Interpretation Date/Time:  Tuesday April 02 2024 12:54:16 EDT Ventricular  Rate:  90 PR Interval:  181 QRS Duration:  90 QT Interval:  394 QTC Calculation: 483 R Axis:   83  Text Interpretation: Sinus rhythm Nonspecific T abnrm, anterolateral leads Similar to prior Confirmed by Darra Chew [phone]) on 04/03/2024 11:19:29 AM        ____________________________________________   PROCEDURES  Procedure(s) performed:   Procedures  None ____________________________________________   INITIAL IMPRESSION / ASSESSMENT AND PLAN / ED COURSE  Pertinent  labs & imaging results that were available during my care of the patient were reviewed by me and considered in my medical decision making (see chart for details).   This patient is Presenting for Evaluation of CP, which does require a range of treatment options, and is a complaint that involves a high risk of morbidity and mortality.  The Differential Diagnoses includes but is not exclusive to acute coronary syndrome, aortic dissection, pulmonary embolism, cardiac tamponade, community-acquired pneumonia, pericarditis, musculoskeletal chest wall pain, etc.   Critical Interventions-    Medications  sodium chloride  0.9 % bolus 500 mL (0 mLs Intravenous Stopped 04/02/24 1404)  clonazePAM  (KLONOPIN ) tablet 1 mg (1 mg Oral Given 04/02/24 1259)    Reassessment after intervention: symptoms improved.   I decided to review pertinent External Data, and in summary PDMP reviewed.   Clinical Laboratory Tests Ordered, included BMP without AKI or severe electrolyte disturbance. No anemia on CBC. Troponin negative.   Cardiac Monitor Tracing which shows NSR.    Social Determinants of Health Risk patient is a smoker.   Medical Decision Making: Summary:  Patient presents to the ED with mild/moderate withdrawal symptoms. Has not reached out to the prescribing teams. Will send screening labs, IVF, and benzo dose here in the ED. Does not appear to be having severe or life-threatening withdrawal symptoms at this time.   Reevaluation with update and discussion with patient.  She was able to contact her pain management doctor as well as her psychiatrist.  Her psychiatrist is planning to send a bridge of her benzodiazepine to the pharmacy for her.  She is continuing to discuss with her pain management doctor on the phone to set up an appointment to restart her pain medications.   Patient's presentation is most consistent with acute presentation with potential threat to life or bodily function.   Disposition:  discharge  ____________________________________________  FINAL CLINICAL IMPRESSION(S) / ED DIAGNOSES  Final diagnoses:  Withdrawal from other psychoactive substance (HCC)    Note:  This document was prepared using Dragon voice recognition software and may include unintentional dictation errors.  Chew Darra, MD, [facility]    Cloys Vera, Chew MATSU, MD 04/04/24 1017

## 2024-04-02 NOTE — Discharge Instructions (Signed)
 As we discussed, your pain management and psychiatry doctors will need to provide [name]-term prescriptions for your medications.  I have sent over some of your Seroquel , prazosin , Remeron  to the pharmacy for a 1 week duration.

## 2024-04-02 NOTE — ED Triage Notes (Signed)
 BIB [facility] from home for medication withdrawal. C/o chest pressure, numbness in arms, insomnia, and nausea. Last doses Friday. No meds since Friday, including: benzos, opiates, and seroquel . Mentions have been unable to get meds refilled d/t transportation issues and missed appointments. Alert, NAD, calm, interactive, sitting in w/c.

## 2024-04-03 NOTE — Progress Notes (Deleted)
 Cardiology Office Note    Date:  04/03/2024  ID:  [name], DOB 1971-06-27, MRN [id number] PCP:  Austin Mutton, MD  Cardiologist:  Jerel Balding, MD  Electrophysiologist:  None   Chief Complaint: ***  History of Present Illness: .    [name] is a 53 y.o. female with visit-pertinent history of reported atrial fibrillation, stage III lymphedema and a strong family history of heart disease  First evaluated by Dr. Balding on 07/25/2023 at the request of her PCP.  Patient reported experiencing chest pain described as a heavy feeling, as if something was sitting on her chest making it hard for her to breathe.  Discomfort could occur at any time, even waking up in the middle the night.  She also reported increased sleepiness, despite previously being insomniac, she was waking every 2 hours during the night.  She had previously undergone sleep study that showed an apnea index of problem certainly 25/h and was prescribed CPAP.  She also reported experiencing swelling in both legs with the left being more severely affected.  There was reported that the left leg was very swollen and warm to the touch and she had been experiencing fever and chills for about a week.  Patient has history of cellulitis and had previously required antibiotics for treatment.  Patient was also reported to be a long-term smoker, smoking a pack a day for approximately 37 years.  Echocardiogram in October 2023 indicated an LVEF of 60 to 65%, normal diastolic function, normal right atrial pressure, no serious valvular abnormalities.  BNP was normal at 17.  It was recommended that patient undergo coronary CTA given chest pain, prediabetes, history of smoking.  She was also started on Keflex  for possibility of cellulitis.  Coronary CTA on 08/24/2023 indicated a coronary calcium  score of 1, this was a percentile for age and sex matched control, mild 25 to 49% stenosis in mid LAD, proximal D1 and proximal D2, minimal 0 to 24%  stenosis in the proximal LAD.  Patient with mild nonobstructive CAD.  Patient was seen in clinic on/21/25.  She reported she been doing okay overall.  She reported she was no under taking rosuvastatin  as she had muscle pain and increased achiness after starting on the medication, following discontinuation symptoms improved.  She endorsed occasional chest discomfort when she had palpitations, question if this was more related to her panic attacks as this is when her palpitations occur.  2-week cardiac monitor was ordered however was never completed.  Today she presents for follow-up.  She reports that she    Chest pain: Coronary CTA on 08/24/2023 indicated a coronary calcium  score of 0, this was a percentile for age and sex matched control, mild 25 to 49% stenosis in mid LAD, proximal D1 and proximal D2, minimal 0 to 24% stenosis in the proximal LAD.  Patient with mild nonobstructive CAD. Today she reports  Hyperlipidemia: Last lipid profile on 07/25/2023 indicated total cholesterol 239, HDL 57, triglycerides 139 and LDL 157.  Patient has previously trialed atorvastatin  and rosuvastatin , notes significant muscle aches.  Patient was started on pravastatin in 12/2023, lipid profile indicated  Hx of atrial flibrillation: Per patient diagnosed with history of atrial fibrillation in 2023.  Per Dr. Tyrone notes only documentation was of transient atrial tachycardia.  2-week cardiac monitor ordered at last office visit, not completed. She reports  Lymphedema: Patient with history of lymphedema and cellulitis. Followed by her PCP.   OSA: Patient previously diagnosed with obstructive  sleep apnea with sleep study. She reports   Chronic bronchitis: Patient with history of wheezing, nighttime congestion and productive cough with thick and sticky sputum. Patient currently smokes   Labwork independently reviewed:   ROS: .   *** denies chest pain, shortness of breath, lower extremity edema, fatigue,  palpitations, melena, hematuria, hemoptysis, diaphoresis, weakness, presyncope, syncope, orthopnea, and PND.  All other systems are reviewed and otherwise negative.  Studies Reviewed: SABRA    EKG:  EKG is ordered today, personally reviewed, demonstrating ***     CV Studies: Cardiac studies reviewed are outlined and summarized above. Otherwise please see EMR for full report. Cardiac Studies & Procedures   ______________________________________________________________________________________________     ECHOCARDIOGRAM  ECHOCARDIOGRAM COMPLETE 05/18/2022  Narrative ECHOCARDIOGRAM REPORT    Patient Name:   [name] Date of Exam: 05/18/2022 Medical Rec #:  [id number]        Height:       60.0 in Accession #:    [id number]       Weight:       185.0 lb Date of Birth:  [birth year]       BSA:          1.806 m Patient Age:    50 years         BP:           122/67 mmHg Patient Gender: F                HR:           90 bpm. Exam Location:  Inpatient  Procedure: 2D Echo, Cardiac Doppler and Color Doppler  Indications:    Syncope  History:        Patient has no prior history of Echocardiogram examinations.  Sonographer:    Velna Parkinson Referring Phys: [id number] ALLISON WOLFE  IMPRESSIONS   1. Left ventricular ejection fraction, by estimation, is 60 to 65%. The left ventricle has normal function. The left ventricle has no regional wall motion abnormalities. Left ventricular diastolic parameters were normal. 2. Right ventricular systolic function is normal. The right ventricular size is normal. 3. The mitral valve is grossly normal. No evidence of mitral valve regurgitation. No evidence of mitral stenosis. 4. The aortic valve is grossly normal. Aortic valve regurgitation is not visualized. No aortic stenosis is present. 5. The inferior vena cava is normal in size with greater than 50% respiratory variability, suggesting right atrial pressure of 3 mmHg.  Comparison(s): No prior  Echocardiogram.  Conclusion(s)/Recommendation(s): Normal biventricular function without evidence of hemodynamically significant valvular heart disease.  FINDINGS Left Ventricle: Left ventricular ejection fraction, by estimation, is 60 to 65%. The left ventricle has normal function. The left ventricle has no regional wall motion abnormalities. The left ventricular internal cavity size was normal in size. There is no left ventricular hypertrophy. Left ventricular diastolic parameters were normal.  Right Ventricle: The right ventricular size is normal. Right vetricular wall thickness was not well visualized. Right ventricular systolic function is normal.  Left Atrium: Left atrial size was normal in size.  Right Atrium: Right atrial size was normal in size.  Pericardium: Trivial pericardial effusion is present.  Mitral Valve: The mitral valve is grossly normal. No evidence of mitral valve regurgitation. No evidence of mitral valve stenosis.  Tricuspid Valve: The tricuspid valve is grossly normal. Tricuspid valve regurgitation is not demonstrated. No evidence of tricuspid stenosis.  Aortic Valve: The aortic valve is grossly normal. Aortic valve regurgitation is not visualized. No  aortic stenosis is present. Aortic valve mean gradient measures 3.0 mmHg. Aortic valve peak gradient measures 6.4 mmHg. Aortic valve area, by VTI measures 1.80 cm.  Pulmonic Valve: The pulmonic valve was not well visualized. Pulmonic valve regurgitation is not visualized. No evidence of pulmonic stenosis.  Aorta: The aortic root, ascending aorta and aortic arch are all structurally normal, with no evidence of dilitation or obstruction.  Venous: The inferior vena cava is normal in size with greater than 50% respiratory variability, suggesting right atrial pressure of 3 mmHg.  IAS/Shunts: The interatrial septum appears to be lipomatous. The interatrial septum was not well visualized.   LEFT VENTRICLE PLAX  2D LVIDd:         4.10 cm   Diastology LVIDs:         2.90 cm   LV e' medial:    7.07 cm/s LV PW:         1.00 cm   LV E/e' medial:  10.5 LV IVS:        1.00 cm   LV e' lateral:   7.72 cm/s LVOT diam:     2.00 cm   LV E/e' lateral: 9.6 LV SV:         37 LV SV Index:   21 LVOT Area:     3.14 cm   RIGHT VENTRICLE TAPSE (M-mode): 1.8 cm  LEFT ATRIUM             Index        RIGHT ATRIUM           Index LA diam:        2.40 cm 1.33 cm/m   RA Area:     11.00 cm LA Vol (A2C):   37.3 ml 20.66 ml/m  RA Volume:   23.80 ml  13.18 ml/m LA Vol (A4C):   35.4 ml 19.60 ml/m LA Biplane Vol: 37.7 ml 20.88 ml/m AORTIC VALVE AV Area (Vmax):    1.90 cm AV Area (Vmean):   1.79 cm AV Area (VTI):     1.80 cm AV Vmax:           126.00 cm/s AV Vmean:          85.500 cm/s AV VTI:            0.206 m AV Peak Grad:      6.4 mmHg AV Mean Grad:      3.0 mmHg LVOT Vmax:         76.40 cm/s LVOT Vmean:        48.700 cm/s LVOT VTI:          0.118 m LVOT/AV VTI ratio: 0.57  AORTA Ao Root diam: 3.20 cm Ao Asc diam:  2.40 cm  MITRAL VALVE MV Area (PHT): 4.21 cm    SHUNTS MV Decel Time: 180 msec    Systemic VTI:  0.12 m MV E velocity: 74.40 cm/s  Systemic Diam: 2.00 cm MV A velocity: 87.90 cm/s MV E/A ratio:  0.85  Shelda Bruckner MD Electronically signed by Shelda Bruckner MD Signature Date/Time: 05/18/2022/3:36:18 PM    Final      CT SCANS  CT CORONARY MORPH W/CTA COR W/SCORE 08/24/2023  Addendum 09/07/2023  5:58 PM ADDENDUM REPORT: 09/07/2023 17:56  EXAM: OVER-READ INTERPRETATION  CT CHEST  The following report is an over-read performed by radiologist Dr. Oneil Devonshire of [facility] on 09/07/2023. This over-read does not include interpretation of cardiac or coronary anatomy or pathology. The coronary calcium   score/coronary CTA interpretation by the cardiologist is attached.  COMPARISON:  None.  FINDINGS: Cardiovascular: There are no significant  extracardiac vascular findings.  Mediastinum/Nodes: There are no enlarged lymph nodes within the visualized mediastinum.  Lungs/Pleura: Mild mucous plugging is noted in the left lower lobe. No focal confluent infiltrate is seen. The visualized lungs appear clear.  Upper abdomen: No significant findings in the visualized upper abdomen.  Musculoskeletal/Chest wall: No chest wall mass or suspicious osseous findings within the visualized chest.  IMPRESSION: Left lower lobe mucous plugging.  No focal consolidation is noted.   Electronically Signed By: Oneil Devonshire M.D. On: 09/07/2023 17:56  Narrative CLINICAL DATA:  79F with chest pain  EXAM: Cardiac/Coronary CTA  TECHNIQUE: The patient was scanned on a [employer].  FINDINGS: A 100 kV prospective scan was triggered in the descending thoracic aorta at 111 HU's. Axial non-contrast 3 mm slices were carried out through the heart. The data set was analyzed on a dedicated work station and scored using the Agatson method. Gantry rotation speed was 250 msecs and collimation was .6 mm. No beta blockade and 0.8 mg of sl NTG was given. The 3D data set was reconstructed in 5% intervals of the 35-75% of the R-R cycle. Phases were analyzed on a dedicated work station using MPR, MIP and VRT modes. The patient received 100 cc of contrast.  Coronary Arteries:  Normal coronary origin.  Right dominance.  RCA is a large dominant artery that gives rise to PDA and PLA. There is no plaque.  Left main is a large artery that gives rise to LAD and LCX arteries.  LAD is a large vessel. Noncalcified plaque in proximal LAD causes 0-24% stenosis. Mixed plaque in mid LAD causes 25-49% stenosis. Mixed plaque in proximal D1 branch causes 25-49% stenosis. Mixed plaque in proximal D2 branch causes 25-49% stenosis  LCX is a small non-dominant artery.  There is no plaque.  Other findings:  Left Ventricle: Normal size  Left Atrium:  Normal size  Pulmonary Veins: Normal configuration  Right Ventricle: Normal size  Right Atrium: Normal size  Cardiac valves: No calcifications  Thoracic aorta: Normal size  Pulmonary Arteries: Normal size  Systemic Veins: Normal drainage  Pericardium: Normal thickness  IMPRESSION: 1. Coronary calcium  score of 1. This was 80th percentile for age and sex matched control.  2. Total plaque volume 58mm3 which is 68th percentile for age and sex-matched controls (calcified plaque 57mm3; noncalcified plaque 75mm3). TPV is mild  3.  Normal coronary origin with right dominance.  4.  Nonobstructive CAD  5.  Mild (25-49%) stenosis in mid LAD, proximal D1, and proximal D2  6.  Minimal (0-24%) stenosis in proximal LAD  CAD-RADS 2. Mild non-obstructive CAD (25-49%). Consider non-atherosclerotic causes of chest pain. Consider preventive therapy and risk factor modification.  Electronically Signed: By: Lonni Nanas M.D. On: 08/25/2023 12:37     ______________________________________________________________________________________________       Current Reported Medications:.    No outpatient medications have been marked as taking for the 04/04/24 encounter (Appointment) with Aaryn Sermon D, NP.    Physical Exam:    VS:  LMP 06/14/2012    Wt Readings from Last 3 Encounters:  04/02/24 166 lb (75.3 kg)  12/18/23 166 lb (75.3 kg)  08/14/23 164 lb (74.4 kg)    GEN: Well nourished, well developed in no acute distress NECK: No JVD; No carotid bruits CARDIAC: ***RRR, no murmurs, rubs, gallops RESPIRATORY:  Clear to auscultation without rales, wheezing  or rhonchi  ABDOMEN: Soft, non-tender, non-distended EXTREMITIES:  No edema; No acute deformity     Asessement and Plan:.     ***     Disposition: F/u with ***  Signed, Dane Kopke D Aalliyah Kilker, NP

## 2024-04-04 ENCOUNTER — Ambulatory Visit: Admitting: Cardiology

## 2024-04-11 ENCOUNTER — Other Ambulatory Visit (HOSPITAL_COMMUNITY): Payer: Self-pay | Admitting: Internal Medicine

## 2024-04-11 DIAGNOSIS — Z1231 Encounter for screening mammogram for malignant neoplasm of breast: Secondary | ICD-10-CM

## 2024-05-01 ENCOUNTER — Ambulatory Visit (HOSPITAL_COMMUNITY)

## 2024-05-01 ENCOUNTER — Inpatient Hospital Stay (HOSPITAL_COMMUNITY): Admission: RE | Admit: 2024-05-01 | Source: Ambulatory Visit

## 2024-05-13 ENCOUNTER — Other Ambulatory Visit (HOSPITAL_COMMUNITY)

## 2024-05-15 NOTE — Progress Notes (Deleted)
 Cardiology Office Note    Date:  05/15/2024  ID:  LYZETTE REINHARDT, DOB [birth year], MRN [id number] PCP:  Austin Mutton, MD  Cardiologist:  Jerel Balding, MD  Electrophysiologist:  None   Chief Complaint: ***  History of Present Illness: .    [name] is a 53 y.o. female with visit-pertinent history of [name] is a 53 y.o. female with visit-pertinent history of reported atrial fibrillation, stage III lymphedema and a strong family history of heart disease  First evaluated by Dr. Balding on 07/25/2023 at the request of her PCP.  Patient reported experiencing chest pain described as a heavy feeling, as if something was sitting on her chest making it hard for her to breathe.  Discomfort could occur at any time, even waking up in the middle the night.  She also reported increased sleepiness, despite previously being insomniac, she is waking every 2 hours during the night.  She had previously undergone sleep study that showed an apnea index of problem certainly 25/h and was prescribed CPAP.  She also reported experiencing swelling in both legs with the left being more severely affected.  There was reported that the left leg was very swollen and warm to the touch and she had been experiencing fever and chills for about a week.  Patient has history of cellulitis and had previously required antibiotics for treatment.  Patient was also reported to be a long-term smoker, smoking a pack a day for approximately 37 years.  Echocardiogram in October 2023 indicated an LVEF of 60 to 65%, normal diastolic function, normal right atrial pressure, no serious valvular abnormalities.  BNP was normal at 17.  It was recommended that patient undergo coronary CTA given chest pain, prediabetes, history of smoking.  She was also started on Keflex  for possibility of cellulitis.  Coronary CTA on 08/24/2023 indicated a coronary calcium  score of 0, this was a percentile for age and sex matched control, mild 25 to  49% stenosis in mid LAD, proximal D1 and proximal D2, minimal 0 to 24% stenosis in the proximal LAD.  Patient with mild nonobstructive CAD.  Today she presents for follow-up.  She reports that she  Chest pain: Coronary CTA on 08/24/2023 indicated a coronary calcium  score of 0, this was a percentile for age and sex matched control, mild 25 to 49% stenosis in mid LAD, proximal D1 and proximal D2, minimal 0 to 24% stenosis in the proximal LAD.  Patient with mild nonobstructive CAD. Today she reports  Hyperlipidemia: Last lipid profile on 07/25/2023 indicated total cholesterol 239, HDL 57, triglycerides 139 and LDL 157.  Patient was started on rosuvastatin  20 mg daily.  To have repeat lipid profile in March.  Hx of atrial flibrillation: Per patient diagnosed with history of atrial fibrillation in 2023.  Per Dr. Tyrone notes only documentation was of transient atrial tachycardia.  Check 2-week ZIO monitor  Lymphedema: Patient with history of lymphedema and cellulitis. Followed by?  OSA: Patient previously diagnosed with obstructive sleep apnea with sleep study.  She is currently  Chronic bronchitis: Patient with history of wheezing, nighttime congestion and productive cough with thick and sticky sputum. Patient currently smokes  Labwork independently reviewed:   ROS: .   *** denies chest pain, shortness of breath, lower extremity edema, fatigue, palpitations, melena, hematuria, hemoptysis, diaphoresis, weakness, presyncope, syncope, orthopnea, and PND.  All other systems are reviewed and otherwise negative.  Studies Reviewed: SABRA    EKG:  EKG is ordered today, personally  reviewed, demonstrating ***     CV Studies: Cardiac studies reviewed are outlined and summarized above. Otherwise please see EMR for full report. Cardiac Studies & Procedures   ______________________________________________________________________________________________     ECHOCARDIOGRAM  ECHOCARDIOGRAM COMPLETE  05/18/2022  Narrative ECHOCARDIOGRAM REPORT    Patient Name:   [name] Date of Exam: 05/18/2022 Medical Rec #:  [id number]        Height:       60.0 in Accession #:    [id number]       Weight:       185.0 lb Date of Birth:  1970-11-01       BSA:          1.806 m Patient Age:    50 years         BP:           122/67 mmHg Patient Gender: F                HR:           90 bpm. Exam Location:  Inpatient  Procedure: 2D Echo, Cardiac Doppler and Color Doppler  Indications:    Syncope  History:        Patient has no prior history of Echocardiogram examinations.  Sonographer:    Velna Parkinson Referring Phys: [id number] ALLISON WOLFE  IMPRESSIONS   1. Left ventricular ejection fraction, by estimation, is 60 to 65%. The left ventricle has normal function. The left ventricle has no regional wall motion abnormalities. Left ventricular diastolic parameters were normal. 2. Right ventricular systolic function is normal. The right ventricular size is normal. 3. The mitral valve is grossly normal. No evidence of mitral valve regurgitation. No evidence of mitral stenosis. 4. The aortic valve is grossly normal. Aortic valve regurgitation is not visualized. No aortic stenosis is present. 5. The inferior vena cava is normal in size with greater than 50% respiratory variability, suggesting right atrial pressure of 3 mmHg.  Comparison(s): No prior Echocardiogram.  Conclusion(s)/Recommendation(s): Normal biventricular function without evidence of hemodynamically significant valvular heart disease.  FINDINGS Left Ventricle: Left ventricular ejection fraction, by estimation, is 60 to 65%. The left ventricle has normal function. The left ventricle has no regional wall motion abnormalities. The left ventricular internal cavity size was normal in size. There is no left ventricular hypertrophy. Left ventricular diastolic parameters were normal.  Right Ventricle: The right ventricular size is normal.  Right vetricular wall thickness was not well visualized. Right ventricular systolic function is normal.  Left Atrium: Left atrial size was normal in size.  Right Atrium: Right atrial size was normal in size.  Pericardium: Trivial pericardial effusion is present.  Mitral Valve: The mitral valve is grossly normal. No evidence of mitral valve regurgitation. No evidence of mitral valve stenosis.  Tricuspid Valve: The tricuspid valve is grossly normal. Tricuspid valve regurgitation is not demonstrated. No evidence of tricuspid stenosis.  Aortic Valve: The aortic valve is grossly normal. Aortic valve regurgitation is not visualized. No aortic stenosis is present. Aortic valve mean gradient measures 3.0 mmHg. Aortic valve peak gradient measures 6.4 mmHg. Aortic valve area, by VTI measures 1.80 cm.  Pulmonic Valve: The pulmonic valve was not well visualized. Pulmonic valve regurgitation is not visualized. No evidence of pulmonic stenosis.  Aorta: The aortic root, ascending aorta and aortic arch are all structurally normal, with no evidence of dilitation or obstruction.  Venous: The inferior vena cava is normal in size with greater than 50% respiratory variability, suggesting  right atrial pressure of 3 mmHg.  IAS/Shunts: The interatrial septum appears to be lipomatous. The interatrial septum was not well visualized.   LEFT VENTRICLE PLAX 2D LVIDd:         4.10 cm   Diastology LVIDs:         2.90 cm   LV e' medial:    7.07 cm/s LV PW:         1.00 cm   LV E/e' medial:  10.5 LV IVS:        1.00 cm   LV e' lateral:   7.72 cm/s LVOT diam:     2.00 cm   LV E/e' lateral: 9.6 LV SV:         37 LV SV Index:   21 LVOT Area:     3.14 cm   RIGHT VENTRICLE TAPSE (M-mode): 1.8 cm  LEFT ATRIUM             Index        RIGHT ATRIUM           Index LA diam:        2.40 cm 1.33 cm/m   RA Area:     11.00 cm LA Vol (A2C):   37.3 ml 20.66 ml/m  RA Volume:   23.80 ml  13.18 ml/m LA Vol (A4C):    35.4 ml 19.60 ml/m LA Biplane Vol: 37.7 ml 20.88 ml/m AORTIC VALVE AV Area (Vmax):    1.90 cm AV Area (Vmean):   1.79 cm AV Area (VTI):     1.80 cm AV Vmax:           126.00 cm/s AV Vmean:          85.500 cm/s AV VTI:            0.206 m AV Peak Grad:      6.4 mmHg AV Mean Grad:      3.0 mmHg LVOT Vmax:         76.40 cm/s LVOT Vmean:        48.700 cm/s LVOT VTI:          0.118 m LVOT/AV VTI ratio: 0.57  AORTA Ao Root diam: 3.20 cm Ao Asc diam:  2.40 cm  MITRAL VALVE MV Area (PHT): 4.21 cm    SHUNTS MV Decel Time: 180 msec    Systemic VTI:  0.12 m MV E velocity: 74.40 cm/s  Systemic Diam: 2.00 cm MV A velocity: 87.90 cm/s MV E/A ratio:  0.85  Shelda Bruckner MD Electronically signed by Shelda Bruckner MD Signature Date/Time: 05/18/2022/3:36:18 PM    Final      CT SCANS  CT CORONARY MORPH W/CTA COR W/SCORE 08/24/2023  Addendum 09/07/2023  5:58 PM ADDENDUM REPORT: 09/07/2023 17:56  EXAM: OVER-READ INTERPRETATION  CT CHEST  The following report is an over-read performed by radiologist Dr. Oneil Devonshire of [facility] on 09/07/2023. This over-read does not include interpretation of cardiac or coronary anatomy or pathology. The coronary calcium  score/coronary CTA interpretation by the cardiologist is attached.  COMPARISON:  None.  FINDINGS: Cardiovascular: There are no significant extracardiac vascular findings.  Mediastinum/Nodes: There are no enlarged lymph nodes within the visualized mediastinum.  Lungs/Pleura: Mild mucous plugging is noted in the left lower lobe. No focal confluent infiltrate is seen. The visualized lungs appear clear.  Upper abdomen: No significant findings in the visualized upper abdomen.  Musculoskeletal/Chest wall: No chest wall mass or suspicious osseous findings within the visualized chest.  IMPRESSION:  Left lower lobe mucous plugging.  No focal consolidation is noted.   Electronically Signed By:  Oneil Devonshire M.D. On: 09/07/2023 17:56  Narrative CLINICAL DATA:  51F with chest pain  EXAM: Cardiac/Coronary CTA  TECHNIQUE: The patient was scanned on a [employer].  FINDINGS: A 100 kV prospective scan was triggered in the descending thoracic aorta at 111 HU's. Axial non-contrast 3 mm slices were carried out through the heart. The data set was analyzed on a dedicated work station and scored using the Agatson method. Gantry rotation speed was 250 msecs and collimation was .6 mm. No beta blockade and 0.8 mg of sl NTG was given. The 3D data set was reconstructed in 5% intervals of the 35-75% of the R-R cycle. Phases were analyzed on a dedicated work station using MPR, MIP and VRT modes. The patient received 100 cc of contrast.  Coronary Arteries:  Normal coronary origin.  Right dominance.  RCA is a large dominant artery that gives rise to PDA and PLA. There is no plaque.  Left main is a large artery that gives rise to LAD and LCX arteries.  LAD is a large vessel. Noncalcified plaque in proximal LAD causes 0-24% stenosis. Mixed plaque in mid LAD causes 25-49% stenosis. Mixed plaque in proximal D1 branch causes 25-49% stenosis. Mixed plaque in proximal D2 branch causes 25-49% stenosis  LCX is a small non-dominant artery.  There is no plaque.  Other findings:  Left Ventricle: Normal size  Left Atrium: Normal size  Pulmonary Veins: Normal configuration  Right Ventricle: Normal size  Right Atrium: Normal size  Cardiac valves: No calcifications  Thoracic aorta: Normal size  Pulmonary Arteries: Normal size  Systemic Veins: Normal drainage  Pericardium: Normal thickness  IMPRESSION: 1. Coronary calcium  score of 1. This was 80th percentile for age and sex matched control.  2. Total plaque volume 6mm3 which is 68th percentile for age and sex-matched controls (calcified plaque 46mm3; noncalcified plaque 20mm3). TPV is mild  3.  Normal coronary origin  with right dominance.  4.  Nonobstructive CAD  5.  Mild (25-49%) stenosis in mid LAD, proximal D1, and proximal D2  6.  Minimal (0-24%) stenosis in proximal LAD  CAD-RADS 2. Mild non-obstructive CAD (25-49%). Consider non-atherosclerotic causes of chest pain. Consider preventive therapy and risk factor modification.  Electronically Signed: By: Lonni Nanas M.D. On: 08/25/2023 12:37     ______________________________________________________________________________________________       Current Reported Medications:.    No outpatient medications have been marked as taking for the 05/17/24 encounter (Appointment) with Gisela Lea D, NP.    Physical Exam:    VS:  LMP 06/14/2012    Wt Readings from Last 3 Encounters:  04/02/24 166 lb (75.3 kg)  12/18/23 166 lb (75.3 kg)  08/14/23 164 lb (74.4 kg)    GEN: Well nourished, well developed in no acute distress NECK: No JVD; No carotid bruits CARDIAC: ***RRR, no murmurs, rubs, gallops RESPIRATORY:  Clear to auscultation without rales, wheezing or rhonchi  ABDOMEN: Soft, non-tender, non-distended EXTREMITIES:  No edema; No acute deformity     Asessement and Plan:.     ***     Disposition: F/u with ***  Signed, Jael Kostick D Nelva Hauk, NP

## 2024-05-17 ENCOUNTER — Ambulatory Visit: Attending: Cardiology | Admitting: Cardiology

## 2024-05-20 ENCOUNTER — Encounter: Payer: Self-pay | Admitting: Cardiology

## 2024-07-22 ENCOUNTER — Emergency Department (HOSPITAL_BASED_OUTPATIENT_CLINIC_OR_DEPARTMENT_OTHER)

## 2024-07-22 ENCOUNTER — Emergency Department (HOSPITAL_BASED_OUTPATIENT_CLINIC_OR_DEPARTMENT_OTHER)
Admission: EM | Admit: 2024-07-22 | Discharge: 2024-07-22 | Disposition: A | Discharge status: Home / Self Care | Attending: Emergency Medicine | Admitting: Emergency Medicine

## 2024-07-22 ENCOUNTER — Other Ambulatory Visit: Payer: Self-pay

## 2024-07-22 ENCOUNTER — Encounter (HOSPITAL_BASED_OUTPATIENT_CLINIC_OR_DEPARTMENT_OTHER): Payer: Self-pay | Admitting: Emergency Medicine

## 2024-07-22 DIAGNOSIS — W19XXXA Unspecified fall, initial encounter: Secondary | ICD-10-CM

## 2024-07-22 DIAGNOSIS — L03116 Cellulitis of left lower limb: Secondary | ICD-10-CM | POA: Insufficient documentation

## 2024-07-22 DIAGNOSIS — M7989 Other specified soft tissue disorders: Secondary | ICD-10-CM | POA: Diagnosis present

## 2024-07-22 DIAGNOSIS — W010XXA Fall on same level from slipping, tripping and stumbling without subsequent striking against object, initial encounter: Secondary | ICD-10-CM | POA: Diagnosis not present

## 2024-07-22 LAB — CBC WITH DIFFERENTIAL/PLATELET
Abs Immature Granulocytes: 0.02 K/uL (ref 0.00–0.07)
Basophils Absolute: 0.1 K/uL (ref 0.0–0.1)
Basophils Relative: 1 %
Eosinophils Absolute: 0.2 K/uL (ref 0.0–0.5)
Eosinophils Relative: 3 %
HCT: 38.3 % (ref 36.0–46.0)
Hemoglobin: 12.3 g/dL (ref 12.0–15.0)
Immature Granulocytes: 0 %
Lymphocytes Relative: 28 %
Lymphs Abs: 1.8 K/uL (ref 0.7–4.0)
MCH: 30.8 pg (ref 26.0–34.0)
MCHC: 32.1 g/dL (ref 30.0–36.0)
MCV: 96 fL (ref 80.0–100.0)
Monocytes Absolute: 0.4 K/uL (ref 0.1–1.0)
Monocytes Relative: 6 %
Neutro Abs: 3.9 K/uL (ref 1.7–7.7)
Neutrophils Relative %: 62 %
Platelets: 293 K/uL (ref 150–400)
RBC: 3.99 MIL/uL (ref 3.87–5.11)
RDW: 13.2 % (ref 11.5–15.5)
WBC: 6.4 K/uL (ref 4.0–10.5)
nRBC: 0 % (ref 0.0–0.2)

## 2024-07-22 LAB — COMPREHENSIVE METABOLIC PANEL WITH GFR
ALT: 9 U/L (ref 0–44)
AST: 19 U/L (ref 15–41)
Albumin: 4 g/dL (ref 3.5–5.0)
Alkaline Phosphatase: 73 U/L (ref 38–126)
Anion gap: 10 (ref 5–15)
BUN: <5 mg/dL — ABNORMAL LOW (ref 6–20)
CO2: 24 mmol/L (ref 22–32)
Calcium: 8.7 mg/dL — ABNORMAL LOW (ref 8.9–10.3)
Chloride: 106 mmol/L (ref 98–111)
Creatinine, Ser: 1 mg/dL (ref 0.44–1.00)
GFR, Estimated: >60 mL/min (ref >60)
Glucose, Bld: 96 mg/dL (ref 70–99)
Potassium: 3.6 mmol/L (ref 3.5–5.1)
Sodium: 141 mmol/L (ref 135–145)
Total Bilirubin: 0.2 mg/dL (ref 0.0–1.2)
Total Protein: 6.6 g/dL (ref 6.5–8.1)

## 2024-07-22 LAB — LACTIC ACID, PLASMA: Lactic Acid, Venous: 1 mmol/L (ref 0.5–1.9)

## 2024-07-22 LAB — URINALYSIS, W/ REFLEX TO CULTURE (INFECTION SUSPECTED)
Bacteria, UA: NONE SEEN
Bilirubin Urine: NEGATIVE
Glucose, UA: NEGATIVE mg/dL
Hgb urine dipstick: NEGATIVE
Ketones, ur: NEGATIVE mg/dL
Nitrite: NEGATIVE
Protein, ur: NEGATIVE mg/dL
RBC / HPF: NONE SEEN RBC/hpf (ref 0–5)
Specific Gravity, Urine: 1.01 (ref 1.005–1.030)
pH: 6.5 (ref 5.0–8.0)

## 2024-07-22 MED ORDER — CEPHALEXIN 250 MG PO CAPS
500.0000 mg | ORAL_CAPSULE | Freq: Once | ORAL | Status: AC
  Administered 2024-07-22: 500 mg via ORAL
  Filled 2024-07-22: qty 2

## 2024-07-22 MED ORDER — CEFUROXIME AXETIL 500 MG PO TABS: 500.0000 mg | ORAL_TABLET | Freq: Two times a day (BID) | ORAL | 0 refills | Status: AC

## 2024-07-22 MED ORDER — KETOROLAC TROMETHAMINE 15 MG/ML IJ SOLN
15.0000 mg | Freq: Once | INTRAMUSCULAR | Status: AC
  Administered 2024-07-22: 15 mg via INTRAMUSCULAR
  Filled 2024-07-22: qty 1

## 2024-07-22 MED ORDER — FUROSEMIDE 20 MG PO TABS: 20.0000 mg | ORAL_TABLET | Freq: Every day | ORAL | 0 refills | Status: AC | PRN

## 2024-07-22 NOTE — ED Provider Notes (Signed)
 [place] [facility] Provider Note   CSN: [id number] Arrival date & time: 07/22/24  1354     Patient presents with: Leg Swelling   [name] is a 53 y.o. female patient with past medical history of PTSD, bipolar disorder, cannabis abuse, suicidal ideation, GERD presents to emergency room with complaint of a fall.  Patient reports that two days go when she was going to the kitchen to make herself [place] she had a fall.  She reports she tripped since it was dark.  She fell straight down on the ground landing on her legs.  Since then she has had bilateral foot pain.  She also mentions that since she has fallen her left lower leg has been increasingly red/warm and she is concerned that she has cellulitis.  She denies any fever at home.   HPI     Prior to Admission medications   Medication Sig Start Date End Date Taking? Authorizing Provider  cefUROXime  (CEFTIN ) 500 MG tablet Take 1 tablet (500 mg total) by mouth 2 (two) times daily with a meal for 7 days. 07/22/24 07/29/24 Yes Loc Feinstein N, PA-C  acetaminophen  (TYLENOL ) 325 MG tablet Take 650 mg by mouth every 6 (six) hours as needed for mild pain (pain score 1-3) or headache.    [provider]  cephALEXin  (KEFLEX ) 500 MG capsule 2 caps po bid x 7 days 08/19/22   Harris, Abigail, PA-C  clonazePAM  (KLONOPIN ) 1 MG tablet Take 1 mg by mouth 3 (three) times daily. 09/14/15   [provider]  cyclobenzaprine  (FLEXERIL ) 10 MG tablet Take 10 mg by mouth as needed. 09/09/22   [provider]  Elastic Bandages & Supports (MEDICAL COMPRESSION STOCKINGS) MISC 1 pair of compression stocking for patient use. 06/12/21   Jarold Olam HERO, PA-C  gabapentin  (NEURONTIN ) 600 MG tablet Take 600 mg by mouth 3 (three) times daily. 09/29/20   [provider]  hydrOXYzine  (VISTARIL ) 50 MG capsule Take 50 mg by mouth daily as needed for anxiety. 09/24/20   [provider]   ibuprofen  (ADVIL ) 800 MG tablet Take 800 mg by mouth 4 (four) times daily as needed for headache or mild pain (pain score 1-3). 06/05/20   [provider]  lamoTRIgine (LAMICTAL) 25 MG tablet Take 25 mg by mouth 2 (two) times daily. 05/30/23   [provider]  lidocaine  (XYLOCAINE ) 2 % solution Use as directed 15 mLs in the mouth or throat as needed for mouth pain. Patient not taking: Reported on 12/18/2023 08/14/23   Beola Terrall RAMAN, PA-C  loperamide  (IMODIUM ) 2 MG capsule Take 2 mg by mouth as needed. 07/11/16   [provider]  metoprolol  tartrate (LOPRESSOR ) 100 MG tablet Take 1 tablet (100 mg total) by mouth once for 1 dose. PLEASE TAKE METOPROLOL  2  HOURS PRIOR TO CTA SCAN. 07/25/23 07/25/23  Croitoru, Mihai, MD  mirtazapine  (REMERON ) 15 MG tablet Take 1 tablet (15 mg total) by mouth at bedtime for 7 days. 04/02/24 04/09/24  Long, Joshua G, MD  naloxone [facility]) nasal spray 4 mg/0.1 mL Place 1 spray into the nose once. Patient not taking: Reported on 12/18/2023 09/06/22   [provider]  nicotine  (NICODERM CQ  - DOSED IN MG/24 HOURS) 21 mg/24hr patch Place 1 patch (21 mg total) onto the skin daily. 04/05/21   Marry Clamp, MD  oxyCODONE -acetaminophen  (PERCOCET) 10-325 MG tablet Take 1 tablet by mouth every 4 (four) hours as needed for pain.  [provider]  oxyCODONE -acetaminophen  (PERCOCET) 7.5-325 MG tablet Take 1 tablet by mouth every 8 (eight) hours as needed for up to 12 doses for severe pain. 08/19/22   Cottie Donnice PARAS, MD  oxyCODONE -acetaminophen  (PERCOCET/ROXICET) 5-325 MG tablet Take 1 tablet by mouth every 6 (six) hours as needed for up to 6 doses for severe pain. 08/19/22   Cottie Donnice PARAS, MD  pantoprazole  (PROTONIX ) 40 MG tablet Take 40 mg by mouth daily. 10/04/20   [provider]  Potassium Chloride  ER 20 MEQ TBCR Take 20 mEq by mouth 2 (two) times daily as needed. Patient not taking: Reported on 12/18/2023 09/06/22   [provider]  prazosin  (MINIPRESS ) 2 MG capsule Take 1 capsule (2 mg total) by mouth at bedtime. 04/02/24 05/02/24  Long, Fonda MATSU, MD  prazosin  (MINIPRESS ) 5 MG capsule Take 1 capsule (5 mg total) by mouth at bedtime for 7 days. 04/02/24 04/09/24  Long, Fonda MATSU, MD  promethazine  (PHENERGAN ) 12.5 MG tablet Take 1 tablet (12.5 mg total) by mouth every 6 (six) hours as needed for nausea or vomiting (not relieved by zofran ). 05/22/22   Amin, Ankit C, MD  QUEtiapine  (SEROQUEL ) 25 MG tablet Take 1 tablet (25 mg total) by mouth 2 (two) times daily for 7 days. 04/02/24 04/09/24  Long, Fonda MATSU, MD  QUEtiapine  (SEROQUEL ) 300 MG tablet Take 1 tablet (300 mg total) by mouth at bedtime for 7 days. 04/02/24 04/09/24  Long, Joshua G, MD  sertraline (ZOLOFT) 50 MG tablet Take 50 mg by mouth every morning. 07/04/23   [provider]  sucralfate  (CARAFATE ) 1 g tablet Take 1 g by mouth in the morning, at noon, and at bedtime. 03/03/22   [provider]  topiramate  (TOPAMAX ) 100 MG tablet Take 100 mg by mouth at bedtime. 07/16/22   [provider]  traZODone  (DESYREL ) 50 MG tablet Take 50 mg by mouth at bedtime as needed. Patient not taking: Reported on 12/18/2023 06/06/23   [provider]  zolpidem  (AMBIEN ) 10 MG tablet Take 10 mg by mouth at bedtime as needed. Patient not taking: Reported on 12/18/2023 07/12/23   [provider]    Allergies: Codeine and Prozac [fluoxetine]    Review of Systems  Musculoskeletal:  Positive for arthralgias.    Updated Vital Signs BP 107/82   Pulse 80   Temp 97.8 F (36.6 C) (Oral)   Resp 19   Ht 5' (1.524 m)   Wt 72.6 kg   LMP 06/14/2012   SpO2 100%   BMI 31.25 kg/m   Physical Exam Vitals and nursing note reviewed.  Constitutional:      General: She is not in acute distress.    Appearance: She is not toxic-appearing.  HENT:     Head: Normocephalic and atraumatic.  Eyes:     General: No scleral icterus.    Conjunctiva/sclera:  Conjunctivae normal.  Cardiovascular:     Rate and Rhythm: Normal rate and regular rhythm.     Pulses: Normal pulses.     Heart sounds: Normal heart sounds.  Pulmonary:     Effort: Pulmonary effort is normal. No respiratory distress.     Breath sounds: Normal breath sounds.  Abdominal:     General: Abdomen is flat. Bowel sounds are normal.     Palpations: Abdomen is soft.     Tenderness: There is no abdominal tenderness.  Musculoskeletal:     Right lower leg: Edema present.     Left lower leg:  Edema present.     Comments: Bilateral pitting edema of lower extremities.  Neurovascularly intact.  Rash over anterior shin which is erythematous consistent with cellulitis of left anterior shin.  Skin:    General: Skin is warm and dry.     Findings: No lesion.  Neurological:     General: No focal deficit present.     Mental Status: She is alert and oriented to person, place, and time. Mental status is at baseline.     (all labs ordered are listed, but only abnormal results are displayed) Labs Reviewed  COMPREHENSIVE METABOLIC PANEL WITH GFR - Abnormal; Notable for the following components:      Result Value   BUN <5 (*)    Calcium  8.7 (*)    All other components within normal limits  URINALYSIS, W/ REFLEX TO CULTURE (INFECTION SUSPECTED) - Abnormal; Notable for the following components:   Color, Urine STRAW (*)    Leukocytes,Ua SMALL (*)    All other components within normal limits  LACTIC ACID, PLASMA  CBC WITH DIFFERENTIAL/PLATELET    EKG: None  Radiology: DG Foot Complete Left Result Date: 07/22/2024 EXAM: 3 OR MORE VIEW(S) XRAY OF THE LEFT FOOT 07/22/2024 05:06:48 PM COMPARISON: 02/01/2010 CLINICAL HISTORY: fall FINDINGS: BONES AND JOINTS: Possible nondisplaced fracture at the base of the first distal phalanx. No focal osseous lesion. No joint dislocation. Small plantar and posterior calcaneal spurs. Mild diffuse interphalangeal joint degenerative change. Osseous  demineralization. Mild degenerative change at the first mtp joint. Um SOFT TISSUES: Soft tissue swelling of the forefoot. IMPRESSION: 1. Possible nondisplaced fracture at the base of the first distal phalanx, correlate for point tenderness. Reports 2. Soft tissue swelling of the forefoot. 3. Small plantar and posterior calcaneal spurs. Electronically signed by: Luke Bun MD 07/22/2024 06:03 PM EST RP Workstation: [id number]   DG Foot Complete Right Result Date: 07/22/2024 EXAM: 3 OR MORE VIEW(S) XRAY OF THE FOOT 07/22/2024 05:06:48 PM COMPARISON: None available. CLINICAL HISTORY: fall FINDINGS: BONES AND JOINTS: No acute fracture. Small calcaneal spurs. No joint dislocation. Mild degenerative change at the first mtp joint. SOFT TISSUES: The soft tissues are unremarkable. IMPRESSION: 1. No acute osseous abnormality. Electronically signed by: Luke Bun MD 07/22/2024 05:59 PM EST RP Workstation: [id number]     Procedures   Medications Ordered in the ED  ketorolac  (TORADOL ) 15 MG/ML injection 15 mg (15 mg Intramuscular Given 07/22/24 1724)  cephALEXin  (KEFLEX ) capsule 500 mg (500 mg Oral Given 07/22/24 1721)                                    Medical Decision Making Amount and/or Complexity of Data Reviewed Labs: ordered. Radiology: ordered.  Risk Prescription drug management.   This patient presents to the ED for concern of fall, this involves an extensive number of treatment options, and is a complaint that carries with it a high risk of complications and morbidity.  The differential diagnosis includes syncope, seizure, dehydration, electrolyte abnormality, mechanical fall   Co morbidities that complicate the patient evaluation  Bipolar   Additional history obtained:  Additional history obtained from was most recently seen in emergency room 04/02/2024 when she had withdrawal from medicines prescribed from outpatient provider, thus declining opioid medicine for stronger pain  control this time Had an echo approximately 2 years ago which showed normal EF at 60 to 65%   Lab Tests:  I personally interpreted labs.  The pertinent results include:   No leukocytosis, no anemia, CMP unremarkable   Imaging Studies ordered:  I ordered imaging studies including x-ray of left and right foot I independently visualized and interpreted imaging which showed left foot x-ray questioning fracture of left great toe however patient has no point tenderness over this area thus doubt this is fracture.  No acute findings on right foot x-ray I agree with the radiologist interpretation   Cardiac Monitoring: / EKG:  The patient was maintained on a cardiac monitor.      Problem List / ED Course / Critical interventions / Medication management  Patient reports emergency room after mechanical fall 2 days ago.  When she tripped she fell directly onto her legs.  She reports she did not hit her head or lose consciousness.  No dizziness, confusion, seizure.  On arrival she is hemodynamically stable and she is well-appearing.  She does complain of bilateral lower extremity swelling which has been diagnosed with lymphedema in the past.  This is not associated with any chest pain shortness of breath or cough.  No history of heart failure.  She is neurovascularly intact.  She does have some worsening to her left anterior shin and she reports it is warmer than normal concerning for cellulitis.  She has no abscess or abrasion over this area.  She reports since her fall she has been right sided toe pain and left sided foot pain.  X-rays show no acute finding over her area of pain.  I ordered medication including Keflex , Toradol  Reevaluation of the patient after these medicines showed that the patient improved I have reviewed the patients home medicines and have made adjustments as needed Able to ambulate here with steady gait vital signs are stable and she is well-appearing. No fever and no v/s  concerning for sepsis.  Will trial Lasix  to help decrease swelling as well as start antibiotic as she appears to be developing a mild cellulitis to left anterior shin.  Feel appropriate for trial of oral outpatient biotic and close primary care follow-up.        Final diagnoses:  Fall, initial encounter  Cellulitis of left lower leg    ED Discharge Orders          Ordered    cefUROXime  (CEFTIN ) 500 MG tablet  2 times daily with meals        07/22/24 1651               Lissie Hinesley, Warren SAILOR, PA-C 07/22/24 1847    Zackowski, Scott, MD 07/23/24 1220

## 2024-07-22 NOTE — ED Triage Notes (Addendum)
 Pt reports LLE swelling and redness x 4 days. Fall this AM. Denies known fever.   Hx of lymphedema. Leg is notably red with +3 pitting edema.

## 2024-07-22 NOTE — Discharge Instructions (Addendum)
 Follow-up with your primary care doctor.  In the meantime I recommend continuing to wear compression stockings, elevation, you can try taking Lasix  once a day as needed for swelling (you do not need to take this medicine consecutively just as needed).  Take antibiotic twice a day.  Please return to emergency room with worsening symptoms or fever.
